# Patient Record
Sex: Female | Born: 1935 | Hispanic: No | Marital: Married | Smoking: Never smoker
Health system: Southern US, Community
[De-identification: ages and names within clinical notes are randomized; demographics above are authoritative.]

## PROBLEM LIST (undated history)

## (undated) DIAGNOSIS — R209 Unspecified disturbances of skin sensation: Secondary | ICD-10-CM

## (undated) DIAGNOSIS — E119 Type 2 diabetes mellitus without complications: Secondary | ICD-10-CM

## (undated) DIAGNOSIS — K219 Gastro-esophageal reflux disease without esophagitis: Secondary | ICD-10-CM

## (undated) DIAGNOSIS — I2510 Atherosclerotic heart disease of native coronary artery without angina pectoris: Secondary | ICD-10-CM

## (undated) DIAGNOSIS — E785 Hyperlipidemia, unspecified: Secondary | ICD-10-CM

## (undated) DIAGNOSIS — E7800 Pure hypercholesterolemia, unspecified: Secondary | ICD-10-CM

## (undated) DIAGNOSIS — I059 Rheumatic mitral valve disease, unspecified: Secondary | ICD-10-CM

## (undated) DIAGNOSIS — J40 Bronchitis, not specified as acute or chronic: Secondary | ICD-10-CM

## (undated) DIAGNOSIS — I10 Essential (primary) hypertension: Secondary | ICD-10-CM

## (undated) DIAGNOSIS — R918 Other nonspecific abnormal finding of lung field: Secondary | ICD-10-CM

## (undated) DIAGNOSIS — D3A00 Benign carcinoid tumor of unspecified site: Secondary | ICD-10-CM

## (undated) DIAGNOSIS — I7121 Aneurysm of the ascending aorta, without rupture: Secondary | ICD-10-CM

## (undated) DIAGNOSIS — G250 Essential tremor: Principal | ICD-10-CM

## (undated) DIAGNOSIS — R262 Difficulty in walking, not elsewhere classified: Secondary | ICD-10-CM

## (undated) DIAGNOSIS — M25569 Pain in unspecified knee: Secondary | ICD-10-CM

## (undated) DIAGNOSIS — I351 Nonrheumatic aortic (valve) insufficiency: Secondary | ICD-10-CM

## (undated) DIAGNOSIS — I712 Thoracic aortic aneurysm, without rupture: Secondary | ICD-10-CM

## (undated) DIAGNOSIS — R609 Edema, unspecified: Secondary | ICD-10-CM

## (undated) DIAGNOSIS — J449 Chronic obstructive pulmonary disease, unspecified: Secondary | ICD-10-CM

## (undated) DIAGNOSIS — M25549 Pain in joints of unspecified hand: Secondary | ICD-10-CM

## (undated) DIAGNOSIS — M25539 Pain in unspecified wrist: Secondary | ICD-10-CM

## (undated) HISTORY — DX: Gastro-esophageal reflux disease without esophagitis: K21.9

## (undated) HISTORY — DX: Other nonspecific abnormal finding of lung field: R91.8

## (undated) HISTORY — DX: Rheumatic mitral valve disease, unspecified: I05.9

## (undated) HISTORY — DX: Nonrheumatic aortic (valve) insufficiency: I35.1

## (undated) HISTORY — DX: Essential (primary) hypertension: I10

## (undated) HISTORY — DX: Chronic obstructive pulmonary disease, unspecified: J44.9

## (undated) HISTORY — DX: Benign carcinoid tumor of unspecified site: D3A.00

## (undated) HISTORY — DX: Thoracic aortic aneurysm, without rupture: I71.2

## (undated) HISTORY — PX: PARS PLANA VITRECTOMY: SHX2166

## (undated) HISTORY — DX: Aneurysm of the ascending aorta, without rupture: I71.21

## (undated) HISTORY — PX: PARS PLANA VITRECTOMY W/ ENDOPHOTOCOAGULATION: SHX2168

## (undated) HISTORY — DX: Hyperlipidemia, unspecified: E78.5

## (undated) HISTORY — DX: Essential tremor: G25.0

## (undated) HISTORY — DX: Pure hypercholesterolemia, unspecified: E78.00

## (undated) HISTORY — DX: Unspecified disturbances of skin sensation: R20.9

## (undated) HISTORY — DX: Type 2 diabetes mellitus without complications: E11.9

## (undated) HISTORY — DX: Pain in unspecified knee: M25.569

## (undated) HISTORY — DX: Atherosclerotic heart disease of native coronary artery without angina pectoris: I25.10

## (undated) HISTORY — DX: Edema, unspecified: R60.9

## (undated) HISTORY — DX: Difficulty in walking, not elsewhere classified: R26.2

## (undated) HISTORY — DX: Pain in joints of unspecified hand: M25.549

## (undated) HISTORY — PX: OTHER SURGICAL HISTORY: SHX169

## (undated) HISTORY — DX: Pain in unspecified wrist: M25.539

## (undated) HISTORY — DX: Bronchitis, not specified as acute or chronic: J40

---

## 2004-02-04 ENCOUNTER — Ambulatory Visit (HOSPITAL_COMMUNITY): Admission: RE | Admit: 2004-02-04 | Discharge: 2004-02-05 | Payer: Self-pay | Admitting: Internal Medicine

## 2004-10-25 DIAGNOSIS — C7A090 Malignant carcinoid tumor of the bronchus and lung: Secondary | ICD-10-CM | POA: Insufficient documentation

## 2004-11-09 ENCOUNTER — Ambulatory Visit: Payer: Self-pay | Admitting: Internal Medicine

## 2005-01-18 ENCOUNTER — Ambulatory Visit: Payer: Self-pay | Admitting: Cardiovascular Disease

## 2005-01-26 ENCOUNTER — Ambulatory Visit (HOSPITAL_COMMUNITY): Admission: RE | Admit: 2005-01-26 | Discharge: 2005-01-26 | Payer: Self-pay | Admitting: Obstetrics & Gynecology

## 2005-02-23 ENCOUNTER — Ambulatory Visit: Payer: Self-pay | Admitting: Internal Medicine

## 2005-03-12 ENCOUNTER — Ambulatory Visit: Payer: Self-pay | Admitting: Gastroenterology

## 2005-03-24 ENCOUNTER — Encounter (INDEPENDENT_AMBULATORY_CARE_PROVIDER_SITE_OTHER): Payer: Self-pay | Admitting: Specialist

## 2005-03-24 ENCOUNTER — Ambulatory Visit: Payer: Self-pay | Admitting: Gastroenterology

## 2005-05-11 ENCOUNTER — Ambulatory Visit: Payer: Self-pay | Admitting: Internal Medicine

## 2005-05-17 ENCOUNTER — Ambulatory Visit: Payer: Self-pay | Admitting: Cardiology

## 2005-05-31 ENCOUNTER — Ambulatory Visit: Payer: Self-pay | Admitting: Internal Medicine

## 2005-06-03 ENCOUNTER — Ambulatory Visit: Payer: Self-pay | Admitting: Critical Care Medicine

## 2005-06-09 ENCOUNTER — Ambulatory Visit (HOSPITAL_COMMUNITY): Admission: RE | Admit: 2005-06-09 | Discharge: 2005-06-09 | Payer: Self-pay | Admitting: Critical Care Medicine

## 2005-06-16 ENCOUNTER — Ambulatory Visit: Payer: Self-pay | Admitting: Critical Care Medicine

## 2005-06-17 ENCOUNTER — Ambulatory Visit (HOSPITAL_COMMUNITY): Admission: RE | Admit: 2005-06-17 | Discharge: 2005-06-17 | Payer: Self-pay | Admitting: Critical Care Medicine

## 2005-07-09 ENCOUNTER — Ambulatory Visit: Payer: Self-pay | Admitting: Cardiovascular Disease

## 2005-07-15 ENCOUNTER — Ambulatory Visit: Payer: Self-pay | Admitting: Internal Medicine

## 2005-07-15 ENCOUNTER — Encounter (INDEPENDENT_AMBULATORY_CARE_PROVIDER_SITE_OTHER): Payer: Self-pay | Admitting: *Deleted

## 2005-07-15 ENCOUNTER — Inpatient Hospital Stay (HOSPITAL_COMMUNITY): Admission: RE | Admit: 2005-07-15 | Discharge: 2005-07-20 | Payer: Self-pay | Admitting: Thoracic Surgery

## 2005-07-15 ENCOUNTER — Ambulatory Visit: Payer: Self-pay | Admitting: Critical Care Medicine

## 2005-07-20 ENCOUNTER — Ambulatory Visit: Payer: Self-pay | Admitting: Internal Medicine

## 2005-07-28 ENCOUNTER — Encounter: Admission: RE | Admit: 2005-07-28 | Discharge: 2005-07-28 | Payer: Self-pay | Admitting: Thoracic Surgery

## 2005-08-17 ENCOUNTER — Encounter: Admission: RE | Admit: 2005-08-17 | Discharge: 2005-08-17 | Payer: Self-pay | Admitting: Thoracic Surgery

## 2005-10-19 ENCOUNTER — Encounter: Admission: RE | Admit: 2005-10-19 | Discharge: 2005-10-19 | Payer: Self-pay | Admitting: Thoracic Surgery

## 2005-10-29 ENCOUNTER — Ambulatory Visit: Payer: Self-pay | Admitting: Critical Care Medicine

## 2005-11-16 ENCOUNTER — Ambulatory Visit: Payer: Self-pay | Admitting: Internal Medicine

## 2005-11-18 ENCOUNTER — Ambulatory Visit (HOSPITAL_COMMUNITY): Admission: RE | Admit: 2005-11-18 | Discharge: 2005-11-18 | Payer: Self-pay | Admitting: Internal Medicine

## 2006-01-14 ENCOUNTER — Ambulatory Visit: Payer: Self-pay | Admitting: Cardiovascular Disease

## 2006-01-19 ENCOUNTER — Encounter: Admission: RE | Admit: 2006-01-19 | Discharge: 2006-01-19 | Payer: Self-pay | Admitting: Thoracic Surgery

## 2006-04-20 ENCOUNTER — Encounter: Admission: RE | Admit: 2006-04-20 | Discharge: 2006-04-20 | Payer: Self-pay | Admitting: Thoracic Surgery

## 2006-05-03 ENCOUNTER — Ambulatory Visit (HOSPITAL_COMMUNITY): Admission: RE | Admit: 2006-05-03 | Discharge: 2006-05-03 | Payer: Self-pay | Admitting: Obstetrics & Gynecology

## 2006-05-12 ENCOUNTER — Ambulatory Visit: Payer: Self-pay | Admitting: Internal Medicine

## 2006-05-17 LAB — CBC WITH DIFFERENTIAL/PLATELET
BASO%: 0.3 % (ref 0.0–2.0)
Basophils Absolute: 0 10*3/uL (ref 0.0–0.1)
EOS%: 1.6 % (ref 0.0–7.0)
HCT: 35.7 % (ref 34.8–46.6)
HGB: 12 g/dL (ref 11.6–15.9)
MCHC: 33.7 g/dL (ref 32.0–36.0)
MONO#: 0.6 10*3/uL (ref 0.1–0.9)
NEUT%: 66.4 % (ref 39.6–76.8)
RDW: 14.1 % (ref 11.3–14.5)
WBC: 8.1 10*3/uL (ref 3.9–10.0)
lymph#: 2 10*3/uL (ref 0.9–3.3)

## 2006-05-17 LAB — COMPREHENSIVE METABOLIC PANEL
ALT: 16 U/L (ref 0–40)
AST: 19 U/L (ref 0–37)
Albumin: 4.3 g/dL (ref 3.5–5.2)
CO2: 28 mEq/L (ref 19–32)
Calcium: 9.1 mg/dL (ref 8.4–10.5)
Chloride: 101 mEq/L (ref 96–112)
Creatinine, Ser: 0.85 mg/dL (ref 0.40–1.20)
Potassium: 3.6 mEq/L (ref 3.5–5.3)

## 2006-05-19 ENCOUNTER — Ambulatory Visit (HOSPITAL_COMMUNITY): Admission: RE | Admit: 2006-05-19 | Discharge: 2006-05-19 | Payer: Self-pay | Admitting: Internal Medicine

## 2006-10-27 ENCOUNTER — Encounter: Admission: RE | Admit: 2006-10-27 | Discharge: 2006-10-27 | Payer: Self-pay | Admitting: Thoracic Surgery

## 2006-11-10 ENCOUNTER — Ambulatory Visit: Payer: Self-pay | Admitting: Internal Medicine

## 2006-11-15 LAB — COMPREHENSIVE METABOLIC PANEL
ALT: 13 U/L (ref 0–35)
AST: 16 U/L (ref 0–37)
CO2: 29 mEq/L (ref 19–32)
Calcium: 9.1 mg/dL (ref 8.4–10.5)
Chloride: 101 mEq/L (ref 96–112)
Creatinine, Ser: 0.78 mg/dL (ref 0.40–1.20)
Potassium: 4 mEq/L (ref 3.5–5.3)
Sodium: 139 mEq/L (ref 135–145)
Total Protein: 7.4 g/dL (ref 6.0–8.3)

## 2006-11-15 LAB — CBC WITH DIFFERENTIAL/PLATELET
BASO%: 0.3 % (ref 0.0–2.0)
EOS%: 1 % (ref 0.0–7.0)
HCT: 34.8 % (ref 34.8–46.6)
MCH: 28.5 pg (ref 26.0–34.0)
MCHC: 33.4 g/dL (ref 32.0–36.0)
MONO#: 0.5 10*3/uL (ref 0.1–0.9)
RBC: 4.07 10*6/uL (ref 3.70–5.32)
RDW: 13.6 % (ref 11.3–14.5)
WBC: 7.7 10*3/uL (ref 3.9–10.0)
lymph#: 1.7 10*3/uL (ref 0.9–3.3)

## 2006-11-17 ENCOUNTER — Ambulatory Visit (HOSPITAL_COMMUNITY): Admission: RE | Admit: 2006-11-17 | Discharge: 2006-11-17 | Payer: Self-pay | Admitting: Internal Medicine

## 2007-01-06 ENCOUNTER — Ambulatory Visit: Payer: Self-pay | Admitting: Cardiovascular Disease

## 2007-01-17 ENCOUNTER — Ambulatory Visit: Payer: Self-pay

## 2007-01-17 ENCOUNTER — Encounter: Payer: Self-pay | Admitting: Cardiology

## 2007-01-29 ENCOUNTER — Emergency Department (HOSPITAL_COMMUNITY): Admission: EM | Admit: 2007-01-29 | Discharge: 2007-01-29 | Payer: Self-pay | Admitting: Emergency Medicine

## 2007-05-03 ENCOUNTER — Ambulatory Visit: Payer: Self-pay | Admitting: Thoracic Surgery

## 2007-05-03 ENCOUNTER — Encounter: Admission: RE | Admit: 2007-05-03 | Discharge: 2007-05-03 | Payer: Self-pay | Admitting: Thoracic Surgery

## 2007-05-05 ENCOUNTER — Ambulatory Visit (HOSPITAL_COMMUNITY): Admission: RE | Admit: 2007-05-05 | Discharge: 2007-05-05 | Payer: Self-pay | Admitting: Obstetrics & Gynecology

## 2007-05-12 ENCOUNTER — Ambulatory Visit: Payer: Self-pay | Admitting: Internal Medicine

## 2007-05-12 ENCOUNTER — Encounter: Admission: RE | Admit: 2007-05-12 | Discharge: 2007-05-12 | Payer: Self-pay | Admitting: Obstetrics & Gynecology

## 2007-05-16 LAB — COMPREHENSIVE METABOLIC PANEL
ALT: 18 U/L (ref 0–35)
AST: 22 U/L (ref 0–37)
Chloride: 106 mEq/L (ref 96–112)
Creatinine, Ser: 0.86 mg/dL (ref 0.40–1.20)
Sodium: 142 mEq/L (ref 135–145)
Total Bilirubin: 0.5 mg/dL (ref 0.3–1.2)
Total Protein: 6.9 g/dL (ref 6.0–8.3)

## 2007-05-16 LAB — CBC WITH DIFFERENTIAL/PLATELET
BASO%: 0.4 % (ref 0.0–2.0)
EOS%: 1.7 % (ref 0.0–7.0)
HCT: 31.6 % — ABNORMAL LOW (ref 34.8–46.6)
LYMPH%: 24.5 % (ref 14.0–48.0)
MCH: 29.4 pg (ref 26.0–34.0)
MCHC: 34.4 g/dL (ref 32.0–36.0)
MONO#: 0.5 10*3/uL (ref 0.1–0.9)
NEUT%: 65.3 % (ref 39.6–76.8)
RBC: 3.7 10*6/uL (ref 3.70–5.32)
WBC: 6.7 10*3/uL (ref 3.9–10.0)
lymph#: 1.7 10*3/uL (ref 0.9–3.3)

## 2007-05-18 ENCOUNTER — Ambulatory Visit (HOSPITAL_COMMUNITY): Admission: RE | Admit: 2007-05-18 | Discharge: 2007-05-18 | Payer: Self-pay | Admitting: Internal Medicine

## 2007-08-16 ENCOUNTER — Ambulatory Visit: Payer: Self-pay | Admitting: Cardiovascular Disease

## 2007-11-10 ENCOUNTER — Ambulatory Visit: Payer: Self-pay | Admitting: Internal Medicine

## 2007-11-15 LAB — CBC WITH DIFFERENTIAL/PLATELET
BASO%: 0.4 % (ref 0.0–2.0)
Eosinophils Absolute: 0.1 10*3/uL (ref 0.0–0.5)
HCT: 34.2 % — ABNORMAL LOW (ref 34.8–46.6)
LYMPH%: 16.9 % (ref 14.0–48.0)
MONO#: 0.5 10*3/uL (ref 0.1–0.9)
NEUT#: 5.2 10*3/uL (ref 1.5–6.5)
NEUT%: 74.6 % (ref 39.6–76.8)
Platelets: 293 10*3/uL (ref 145–400)
RBC: 4.05 10*6/uL (ref 3.70–5.32)
WBC: 7 10*3/uL (ref 3.9–10.0)
lymph#: 1.2 10*3/uL (ref 0.9–3.3)

## 2007-11-15 LAB — COMPREHENSIVE METABOLIC PANEL
ALT: 16 U/L (ref 0–35)
CO2: 27 mEq/L (ref 19–32)
Calcium: 9 mg/dL (ref 8.4–10.5)
Chloride: 100 mEq/L (ref 96–112)
Glucose, Bld: 135 mg/dL — ABNORMAL HIGH (ref 70–99)
Sodium: 139 mEq/L (ref 135–145)
Total Bilirubin: 0.4 mg/dL (ref 0.3–1.2)
Total Protein: 7.2 g/dL (ref 6.0–8.3)

## 2007-11-17 ENCOUNTER — Ambulatory Visit (HOSPITAL_COMMUNITY): Admission: RE | Admit: 2007-11-17 | Discharge: 2007-11-17 | Payer: Self-pay | Admitting: Internal Medicine

## 2007-11-21 ENCOUNTER — Ambulatory Visit: Payer: Self-pay | Admitting: Thoracic Surgery

## 2007-11-23 ENCOUNTER — Encounter: Payer: Self-pay | Admitting: Critical Care Medicine

## 2007-12-07 ENCOUNTER — Encounter: Admission: RE | Admit: 2007-12-07 | Discharge: 2007-12-07 | Payer: Self-pay | Admitting: Obstetrics & Gynecology

## 2008-01-31 ENCOUNTER — Ambulatory Visit: Payer: Self-pay | Admitting: Cardiovascular Disease

## 2008-02-09 ENCOUNTER — Ambulatory Visit: Payer: Self-pay

## 2008-02-09 ENCOUNTER — Encounter: Payer: Self-pay | Admitting: Cardiovascular Disease

## 2008-05-13 ENCOUNTER — Ambulatory Visit: Payer: Self-pay | Admitting: Internal Medicine

## 2008-05-16 LAB — CBC WITH DIFFERENTIAL/PLATELET
BASO%: 0.3 % (ref 0.0–2.0)
EOS%: 1.3 % (ref 0.0–7.0)
Eosinophils Absolute: 0.1 10*3/uL (ref 0.0–0.5)
LYMPH%: 17.7 % (ref 14.0–48.0)
MCHC: 33.4 g/dL (ref 32.0–36.0)
MCV: 86.2 fL (ref 81.0–101.0)
MONO%: 6.6 % (ref 0.0–13.0)
NEUT#: 4.9 10*3/uL (ref 1.5–6.5)
RBC: 3.84 10*6/uL (ref 3.70–5.32)
RDW: 15 % — ABNORMAL HIGH (ref 11.3–14.5)

## 2008-05-16 LAB — COMPREHENSIVE METABOLIC PANEL
ALT: 17 U/L (ref 0–35)
AST: 20 U/L (ref 0–37)
Albumin: 4.4 g/dL (ref 3.5–5.2)
Alkaline Phosphatase: 79 U/L (ref 39–117)
Potassium: 3.7 mEq/L (ref 3.5–5.3)
Sodium: 139 mEq/L (ref 135–145)
Total Bilirubin: 0.4 mg/dL (ref 0.3–1.2)
Total Protein: 7 g/dL (ref 6.0–8.3)

## 2008-05-20 ENCOUNTER — Ambulatory Visit (HOSPITAL_COMMUNITY): Admission: RE | Admit: 2008-05-20 | Discharge: 2008-05-20 | Payer: Self-pay | Admitting: Internal Medicine

## 2008-05-23 ENCOUNTER — Encounter: Payer: Self-pay | Admitting: Critical Care Medicine

## 2008-06-19 ENCOUNTER — Encounter: Admission: RE | Admit: 2008-06-19 | Discharge: 2008-06-19 | Payer: Self-pay | Admitting: Obstetrics & Gynecology

## 2008-06-20 ENCOUNTER — Emergency Department (HOSPITAL_COMMUNITY): Admission: EM | Admit: 2008-06-20 | Discharge: 2008-06-20 | Payer: Self-pay | Admitting: Family Medicine

## 2008-06-24 ENCOUNTER — Emergency Department (HOSPITAL_COMMUNITY): Admission: EM | Admit: 2008-06-24 | Discharge: 2008-06-24 | Payer: Self-pay | Admitting: Family Medicine

## 2008-06-26 ENCOUNTER — Ambulatory Visit: Payer: Self-pay | Admitting: Thoracic Surgery

## 2008-07-10 ENCOUNTER — Ambulatory Visit: Payer: Self-pay | Admitting: Cardiovascular Disease

## 2008-11-11 ENCOUNTER — Ambulatory Visit: Payer: Self-pay | Admitting: Internal Medicine

## 2008-11-18 ENCOUNTER — Ambulatory Visit (HOSPITAL_COMMUNITY): Admission: RE | Admit: 2008-11-18 | Discharge: 2008-11-18 | Payer: Self-pay | Admitting: Internal Medicine

## 2008-11-20 ENCOUNTER — Encounter: Payer: Self-pay | Admitting: Critical Care Medicine

## 2008-12-25 ENCOUNTER — Encounter: Admission: RE | Admit: 2008-12-25 | Discharge: 2008-12-25 | Payer: Self-pay | Admitting: Thoracic Surgery

## 2008-12-25 ENCOUNTER — Ambulatory Visit: Payer: Self-pay | Admitting: Thoracic Surgery

## 2009-01-07 ENCOUNTER — Ambulatory Visit: Payer: Self-pay | Admitting: Cardiovascular Disease

## 2009-04-10 ENCOUNTER — Encounter: Payer: Self-pay | Admitting: Cardiovascular Disease

## 2009-04-15 ENCOUNTER — Emergency Department (HOSPITAL_COMMUNITY): Admission: EM | Admit: 2009-04-15 | Discharge: 2009-04-15 | Payer: Self-pay | Admitting: Family Medicine

## 2009-05-15 ENCOUNTER — Ambulatory Visit: Payer: Self-pay | Admitting: Internal Medicine

## 2009-05-20 LAB — CBC WITH DIFFERENTIAL/PLATELET
BASO%: 0.3 % (ref 0.0–2.0)
Eosinophils Absolute: 0.1 10*3/uL (ref 0.0–0.5)
MONO#: 0.3 10*3/uL (ref 0.1–0.9)
MONO%: 6.1 % (ref 0.0–14.0)
NEUT#: 3.6 10*3/uL (ref 1.5–6.5)
RBC: 3.8 10*6/uL (ref 3.70–5.45)
RDW: 13.6 % (ref 11.2–14.5)
WBC: 5 10*3/uL (ref 3.9–10.3)

## 2009-05-20 LAB — COMPREHENSIVE METABOLIC PANEL
ALT: 20 U/L (ref 0–35)
Albumin: 3.8 g/dL (ref 3.5–5.2)
Alkaline Phosphatase: 66 U/L (ref 39–117)
CO2: 29 mEq/L (ref 19–32)
Glucose, Bld: 172 mg/dL — ABNORMAL HIGH (ref 70–99)
Potassium: 3.5 mEq/L (ref 3.5–5.3)
Sodium: 136 mEq/L (ref 135–145)
Total Protein: 7 g/dL (ref 6.0–8.3)

## 2009-05-26 ENCOUNTER — Encounter: Payer: Self-pay | Admitting: Critical Care Medicine

## 2009-06-17 ENCOUNTER — Ambulatory Visit: Payer: Self-pay | Admitting: Thoracic Surgery

## 2009-06-17 ENCOUNTER — Encounter: Admission: RE | Admit: 2009-06-17 | Discharge: 2009-06-17 | Payer: Self-pay | Admitting: Thoracic Surgery

## 2009-06-19 ENCOUNTER — Ambulatory Visit: Payer: Self-pay | Admitting: Cardiovascular Disease

## 2009-06-19 DIAGNOSIS — K219 Gastro-esophageal reflux disease without esophagitis: Secondary | ICD-10-CM | POA: Insufficient documentation

## 2009-06-19 DIAGNOSIS — E1151 Type 2 diabetes mellitus with diabetic peripheral angiopathy without gangrene: Secondary | ICD-10-CM

## 2009-06-19 DIAGNOSIS — E785 Hyperlipidemia, unspecified: Secondary | ICD-10-CM | POA: Insufficient documentation

## 2009-06-19 DIAGNOSIS — E7800 Pure hypercholesterolemia, unspecified: Secondary | ICD-10-CM

## 2009-06-19 DIAGNOSIS — I351 Nonrheumatic aortic (valve) insufficiency: Secondary | ICD-10-CM

## 2009-06-19 DIAGNOSIS — J40 Bronchitis, not specified as acute or chronic: Secondary | ICD-10-CM | POA: Insufficient documentation

## 2009-06-19 DIAGNOSIS — I2510 Atherosclerotic heart disease of native coronary artery without angina pectoris: Secondary | ICD-10-CM

## 2009-06-19 DIAGNOSIS — I10 Essential (primary) hypertension: Secondary | ICD-10-CM | POA: Insufficient documentation

## 2009-06-19 DIAGNOSIS — J449 Chronic obstructive pulmonary disease, unspecified: Secondary | ICD-10-CM

## 2009-06-19 DIAGNOSIS — I719 Aortic aneurysm of unspecified site, without rupture: Secondary | ICD-10-CM | POA: Insufficient documentation

## 2009-06-19 DIAGNOSIS — R609 Edema, unspecified: Secondary | ICD-10-CM

## 2009-07-16 ENCOUNTER — Encounter: Admission: RE | Admit: 2009-07-16 | Discharge: 2009-07-16 | Payer: Self-pay | Admitting: Unknown Physician Specialty

## 2009-08-29 ENCOUNTER — Emergency Department (HOSPITAL_COMMUNITY): Admission: EM | Admit: 2009-08-29 | Discharge: 2009-08-29 | Payer: Self-pay | Admitting: Emergency Medicine

## 2009-09-02 ENCOUNTER — Encounter: Admission: RE | Admit: 2009-09-02 | Discharge: 2009-09-02 | Payer: Self-pay | Admitting: Thoracic Surgery

## 2009-09-02 ENCOUNTER — Ambulatory Visit: Payer: Self-pay | Admitting: Thoracic Surgery

## 2009-09-08 ENCOUNTER — Encounter
Admission: RE | Admit: 2009-09-08 | Discharge: 2009-10-21 | Payer: Self-pay | Admitting: Physical Medicine & Rehabilitation

## 2009-09-09 ENCOUNTER — Ambulatory Visit: Payer: Self-pay | Admitting: Physical Medicine & Rehabilitation

## 2009-09-15 ENCOUNTER — Ambulatory Visit (HOSPITAL_COMMUNITY): Admission: RE | Admit: 2009-09-15 | Discharge: 2009-09-15 | Payer: Self-pay | Admitting: Thoracic Surgery

## 2009-09-15 ENCOUNTER — Ambulatory Visit: Payer: Self-pay | Admitting: Thoracic Surgery

## 2009-09-15 HISTORY — PX: OTHER SURGICAL HISTORY: SHX169

## 2009-09-26 ENCOUNTER — Ambulatory Visit (HOSPITAL_COMMUNITY)
Admission: RE | Admit: 2009-09-26 | Discharge: 2009-09-26 | Payer: Self-pay | Admitting: Physical Medicine & Rehabilitation

## 2009-10-02 ENCOUNTER — Ambulatory Visit (HOSPITAL_COMMUNITY): Admission: RE | Admit: 2009-10-02 | Discharge: 2009-10-02 | Payer: Self-pay | Admitting: Thoracic Surgery

## 2009-10-06 ENCOUNTER — Encounter: Payer: Self-pay | Admitting: Thoracic Surgery

## 2009-10-06 ENCOUNTER — Observation Stay (HOSPITAL_COMMUNITY): Admission: RE | Admit: 2009-10-06 | Discharge: 2009-10-07 | Payer: Self-pay | Admitting: Thoracic Surgery

## 2009-10-06 HISTORY — PX: OTHER SURGICAL HISTORY: SHX169

## 2009-10-14 ENCOUNTER — Ambulatory Visit: Payer: Self-pay | Admitting: Physical Medicine & Rehabilitation

## 2009-10-15 ENCOUNTER — Encounter: Admission: RE | Admit: 2009-10-15 | Discharge: 2009-10-15 | Payer: Self-pay | Admitting: Thoracic Surgery

## 2009-10-15 ENCOUNTER — Ambulatory Visit: Payer: Self-pay | Admitting: Thoracic Surgery

## 2009-11-06 ENCOUNTER — Encounter
Admission: RE | Admit: 2009-11-06 | Discharge: 2010-02-04 | Payer: Self-pay | Admitting: Physical Medicine & Rehabilitation

## 2009-11-07 ENCOUNTER — Ambulatory Visit: Payer: Self-pay | Admitting: Physical Medicine & Rehabilitation

## 2009-11-12 ENCOUNTER — Telehealth: Payer: Self-pay | Admitting: Cardiovascular Disease

## 2009-11-13 ENCOUNTER — Encounter: Payer: Self-pay | Admitting: Cardiovascular Disease

## 2009-11-19 ENCOUNTER — Ambulatory Visit: Payer: Self-pay | Admitting: Internal Medicine

## 2009-11-21 ENCOUNTER — Ambulatory Visit (HOSPITAL_COMMUNITY): Admission: RE | Admit: 2009-11-21 | Discharge: 2009-11-21 | Payer: Self-pay | Admitting: Internal Medicine

## 2009-11-21 LAB — CBC WITH DIFFERENTIAL/PLATELET
Basophils Absolute: 0 10*3/uL (ref 0.0–0.1)
Eosinophils Absolute: 0.1 10*3/uL (ref 0.0–0.5)
HCT: 36.7 % (ref 34.8–46.6)
HGB: 12.3 g/dL (ref 11.6–15.9)
MCV: 92.3 fL (ref 79.5–101.0)
MONO%: 8 % (ref 0.0–14.0)
NEUT#: 4.9 10*3/uL (ref 1.5–6.5)
RDW: 14.1 % (ref 11.2–14.5)

## 2009-11-21 LAB — COMPREHENSIVE METABOLIC PANEL
Albumin: 3.7 g/dL (ref 3.5–5.2)
BUN: 23 mg/dL (ref 6–23)
Calcium: 8.8 mg/dL (ref 8.4–10.5)
Chloride: 103 mEq/L (ref 96–112)
Glucose, Bld: 114 mg/dL — ABNORMAL HIGH (ref 70–99)
Potassium: 3.7 mEq/L (ref 3.5–5.3)

## 2009-11-26 ENCOUNTER — Encounter: Admission: RE | Admit: 2009-11-26 | Discharge: 2009-11-26 | Payer: Self-pay | Admitting: Thoracic Surgery

## 2009-11-26 ENCOUNTER — Ambulatory Visit: Payer: Self-pay | Admitting: Thoracic Surgery

## 2009-12-05 ENCOUNTER — Telehealth: Payer: Self-pay | Admitting: Cardiovascular Disease

## 2009-12-09 ENCOUNTER — Ambulatory Visit (HOSPITAL_COMMUNITY): Admission: RE | Admit: 2009-12-09 | Discharge: 2009-12-09 | Payer: Self-pay | Admitting: Thoracic Surgery

## 2009-12-09 ENCOUNTER — Ambulatory Visit: Payer: Self-pay | Admitting: Thoracic Surgery

## 2009-12-09 HISTORY — PX: VIDEO BRONCHOSCOPY: SHX5072

## 2009-12-12 ENCOUNTER — Ambulatory Visit: Payer: Self-pay | Admitting: Thoracic Surgery

## 2009-12-23 ENCOUNTER — Telehealth: Payer: Self-pay | Admitting: Cardiovascular Disease

## 2010-01-01 ENCOUNTER — Telehealth: Payer: Self-pay | Admitting: Cardiovascular Disease

## 2010-01-05 ENCOUNTER — Encounter: Payer: Self-pay | Admitting: Cardiovascular Disease

## 2010-03-10 ENCOUNTER — Ambulatory Visit: Payer: Self-pay | Admitting: Thoracic Surgery

## 2010-04-01 ENCOUNTER — Encounter: Payer: Self-pay | Admitting: Internal Medicine

## 2010-04-06 ENCOUNTER — Encounter (INDEPENDENT_AMBULATORY_CARE_PROVIDER_SITE_OTHER): Payer: Self-pay | Admitting: *Deleted

## 2010-05-13 ENCOUNTER — Encounter: Payer: Self-pay | Admitting: Cardiovascular Disease

## 2010-05-22 ENCOUNTER — Ambulatory Visit (HOSPITAL_BASED_OUTPATIENT_CLINIC_OR_DEPARTMENT_OTHER): Payer: Medicare Other | Admitting: Internal Medicine

## 2010-05-26 ENCOUNTER — Ambulatory Visit (HOSPITAL_COMMUNITY): Admission: RE | Admit: 2010-05-26 | Discharge: 2010-05-26 | Payer: Self-pay | Admitting: Internal Medicine

## 2010-05-26 LAB — CBC WITH DIFFERENTIAL/PLATELET
Basophils Absolute: 0 10*3/uL (ref 0.0–0.1)
EOS%: 2.5 % (ref 0.0–7.0)
HGB: 11.5 g/dL — ABNORMAL LOW (ref 11.6–15.9)
MCH: 31.4 pg (ref 25.1–34.0)
NEUT#: 3.7 10*3/uL (ref 1.5–6.5)
RBC: 3.67 10*6/uL — ABNORMAL LOW (ref 3.70–5.45)
RDW: 13.9 % (ref 11.2–14.5)
lymph#: 1.4 10*3/uL (ref 0.9–3.3)

## 2010-05-26 LAB — COMPREHENSIVE METABOLIC PANEL
Alkaline Phosphatase: 72 U/L (ref 39–117)
Chloride: 101 mEq/L (ref 96–112)
Creatinine, Ser: 0.96 mg/dL (ref 0.40–1.20)
Glucose, Bld: 75 mg/dL (ref 70–99)
Potassium: 3.7 mEq/L (ref 3.5–5.3)
Sodium: 138 mEq/L (ref 135–145)
Total Bilirubin: 1 mg/dL (ref 0.3–1.2)

## 2010-06-03 ENCOUNTER — Ambulatory Visit: Payer: Self-pay | Admitting: Thoracic Surgery

## 2010-06-05 ENCOUNTER — Ambulatory Visit: Payer: Self-pay | Admitting: Physical Medicine & Rehabilitation

## 2010-06-05 ENCOUNTER — Encounter
Admission: RE | Admit: 2010-06-05 | Discharge: 2010-09-03 | Payer: Self-pay | Admitting: Physical Medicine & Rehabilitation

## 2010-06-22 ENCOUNTER — Ambulatory Visit: Payer: Self-pay | Admitting: Cardiovascular Disease

## 2010-07-02 ENCOUNTER — Ambulatory Visit: Payer: Self-pay | Admitting: Thoracic Surgery

## 2010-07-02 ENCOUNTER — Ambulatory Visit (HOSPITAL_COMMUNITY): Admission: RE | Admit: 2010-07-02 | Discharge: 2010-07-02 | Payer: Self-pay | Admitting: Thoracic Surgery

## 2010-07-02 HISTORY — PX: OTHER SURGICAL HISTORY: SHX169

## 2010-07-08 ENCOUNTER — Ambulatory Visit: Payer: Self-pay | Admitting: Thoracic Surgery

## 2010-08-28 ENCOUNTER — Encounter: Admission: RE | Admit: 2010-08-28 | Discharge: 2010-08-28 | Payer: Self-pay | Admitting: Obstetrics & Gynecology

## 2010-09-30 ENCOUNTER — Ambulatory Visit: Payer: Self-pay | Admitting: Thoracic Surgery

## 2010-09-30 ENCOUNTER — Encounter
Admission: RE | Admit: 2010-09-30 | Discharge: 2010-09-30 | Payer: Self-pay | Source: Home / Self Care | Admitting: Thoracic Surgery

## 2010-11-03 ENCOUNTER — Telehealth: Payer: Self-pay | Admitting: Cardiovascular Disease

## 2010-11-10 ENCOUNTER — Encounter: Payer: Self-pay | Admitting: Cardiovascular Disease

## 2010-11-10 ENCOUNTER — Telehealth: Payer: Self-pay | Admitting: Cardiovascular Disease

## 2010-11-12 ENCOUNTER — Other Ambulatory Visit: Payer: Self-pay | Admitting: Internal Medicine

## 2010-11-12 ENCOUNTER — Telehealth: Payer: Self-pay | Admitting: Cardiovascular Disease

## 2010-11-12 DIAGNOSIS — D3A00 Benign carcinoid tumor of unspecified site: Secondary | ICD-10-CM

## 2010-11-13 ENCOUNTER — Encounter (INDEPENDENT_AMBULATORY_CARE_PROVIDER_SITE_OTHER): Payer: Self-pay | Admitting: *Deleted

## 2010-11-14 ENCOUNTER — Encounter: Payer: Self-pay | Admitting: Thoracic Surgery

## 2010-11-15 ENCOUNTER — Encounter: Payer: Self-pay | Admitting: Thoracic Surgery

## 2010-11-16 ENCOUNTER — Encounter: Payer: Self-pay | Admitting: Internal Medicine

## 2010-11-24 NOTE — Progress Notes (Signed)
        Additional Follow-up for Phone Call Additional follow up Details #2::    Pharmacy states they got the form but they need more information she states they sent another form but i never recined this form will; ask pt if i can call it into another pharmacy until they can get it through the mail. Called pt to let her no we have samples but they will not pick up the phone called twice will call again. I have enough samples until her medication comes in the mail but i only have 20mg  she will have to take 2 tabs. Follow-up by: Kem Parkinson,  December 05, 2009 8:39 AM  Additional Follow-up for Phone Call Additional follow up Details #3:: Details for Additional Follow-up Action Taken: Got in touch with pt i told her to come up to the office and pick up samples. [employer] people with the PA form and asked them to resend a copy of what we need tio fill out   [phone] insurane company number. Woud like to know the diagnoses. I asked them fri to send over PA form.  Called and talked to insurance they said they will be faxing over a form after they review her order

## 2010-11-24 NOTE — Consult Note (Signed)
Summary: [facility]  [facility]   Imported By: Marylou Mccoy 06/02/2010 14:44:39  _____________________________________________________________________  External Attachment:    Type:   Image     Comment:   External Document

## 2010-11-24 NOTE — Procedures (Signed)
Summary: Colonoscopy/[facility]  Colonoscopy/[facility]   Imported By: Sherian Rein 04/06/2010 12:06:24  _____________________________________________________________________  External Attachment:    Type:   Image     Comment:   External Document

## 2010-11-24 NOTE — Assessment & Plan Note (Signed)
Summary: f1y/dm  Medications Added FUROSEMIDE 20 MG TABS (FUROSEMIDE) 1 tablet daily as needed for swelling      Allergies Added: NKDA  CC:  check up.  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  I reviewed a letter from Dr. Edwyna Shell indicating that her ascending aorta had reached 5 cm.  She was referred to [facility] for further evaluation.  She does not strike me as an ideal operative candidate.  Her hypertension has been under good control.  I told her I wanted her on 2.5 mg of Indapamide.  I also told her that I would like her to talk to her primary care doctor about getting off Actos.  She has significant lower extremity edema from varicose veins and this is harder to treat while on this particular oral hypoglycemic.  Otherwise she's not had any significant chest pain.  She has mild chronic edema she has mild chronic exertional dyspnea.  I believe she sees Dr. Bernette Mayers for her carcinoid. I reviewed her CT from this month and the ascending Ao is stable at 5.0cm  with stable metastatic carcinoid burden  Current Problems (verified): 1)  Hypertension  (ICD-401.9) 2)  Cad  (ICD-414.00) 3)  Aortic Insufficiency  (ICD-424.1) 4)  Hyperlipidemia  (ICD-272.4) 5)  Aortic Aneurysm  (ICD-441.9) 6)  Hypercholesterolemia  (ICD-272.0) 7)  Dm  (ICD-250.00) 8)  COPD  (ICD-496) 9)  Bronchitis  (ICD-490) 10)  Edema  (ICD-782.3) 11)  Gerd  (ICD-530.81)  Current Medications (verified): 1)  Nitrostat 0.4 Mg Subl (Nitroglycerin) .... As Needed 2)  Omeprazole 40 Mg Cpdr (Omeprazole) .Marland Kitchen.. 1 Tab By Mouth Once Daily 3)  Indapamide 2.5 Mg Tabs (Indapamide) .... One Tab By Mouth Once Daily 4)  Aspirin 81 Mg  Tabs (Aspirin) .Marland Kitchen.. 1 Tab By Mouth Once Daily 5)  Ginkgo Biloba   Extr (Ginkgo Biloba) .Marland Kitchen.. 1 Tab By Mouth Once Daily 6)  Multivitamins   Tabs (Multiple Vitamin)  .Marland Kitchen.. 1 Tab By Mouth Once Daily 7)  Tylenol 325 Mg Tabs (Acetaminophen) .... As Needed 8)  Potassium Chloride Cr 10 Meq Cr-Caps (Potassium Chloride) .... 2 Tab By Mouth Once Daily 9)  Integra Plus  Caps (Fefum-Fepoly-Fa-B Cmp-C-Biot) .Marland Kitchen.. 1 Tab By Mouth Once Daily 10)  Glimepiride 1 Mg Tabs (Glimepiride) .Marland Kitchen.. 1 Tab By Mouth Once Daily 11)  Amlodipine Besylate 10 Mg Tabs (Amlodipine Besylate) .Marland Kitchen.. 1 Tab By Mouth Once Daily 12)  Tussionex Pennkinetic Er 8-10 Mg/88ml Lqcr (Chlorpheniramine-Hydrocodone) .... As Needed 13)  Spiriva Handihaler 18 Mcg Caps (Tiotropium Bromide Monohydrate) .... Once Daily 14)  Advair Diskus 100-50 Mcg/dose Misc (Fluticasone-Salmeterol) .... Once Daily 15)  Proventil Hfa 108 (90 Base) Mcg/act Aers (Albuterol Sulfate) .... Once Daily 16)  Flonase 50 Mcg/act Susp (Fluticasone Propionate) .... Once Daily 17)  Singulair 10 Mg Tabs (Montelukast Sodium) .Marland Kitchen.. 1 Once Daily 18)  Aristocort Cream .... As Needed 19)  Benicar 40 Mg Tabs (Olmesartan Medoxomil) .Marland Kitchen.. 1 Tab By Mouth Two Times A Day 20)  Crestor 5 Mg Tabs (Rosuvastatin Calcium) .... Take One Tablet By Mouth Daily. 21)  Actos 45 Mg Tabs (Pioglitazone Hcl) .Marland Kitchen.. 1 Tab By Mouth Once Daily 22)  Nifedipine 30 Mg Xr24h-Tab (Nifedipine) .Marland Kitchen.. 1 Tab By Mouth Once Daily  Allergies (verified): No Known Drug Allergies  Past History:  Past Medical History: Last updated: 06/19/2009 Current Problems:  HYPERTENSION (ICD-401.9) CAD (ICD-414.00) AORTIC INSUFFICIENCY (ICD-424.1) HYPERLIPIDEMIA (ICD-272.4)  AORTIC ANEURYSM (ICD-441.9) HYPERCHOLESTEROLEMIA (ICD-272.0) DM (ICD-250.00) COPD (ICD-496) BRONCHITIS (ICD-490) EDEMA (ICD-782.3) GERD (ICD-530.81) carcinoid tumor of lung   Stable aortic valve disease  Past Surgical History: Last updated: 06/19/2009 Pars plana vitrectomy, right eye; posterior capsulectomy, right eye; endophotocoagulation, right eye.  Family History: Last updated: 06/19/2009 non-contribry  Social  History: Last updated: 06/19/2009 Married From [place] Lives with daughter Non-smoker Non-drinker  Review of Systems       Denies fever, malais, weight loss, blurry vision, decreased visual acuity, cough, sputum, SOB, hemoptysis, pleuritic pain, palpitaitons, heartburn, abdominal pain, melena, lower extremity edema, claudication, or rash.   Vital Signs:  Patient profile:   77 year old female Weight:      185 pounds Pulse rate:   71 / minute Resp:     12 per minute BP sitting:   132 / 86  (left arm)  Vitals Entered By: Kem Parkinson (June 22, 2010 2:29 PM)  Physical Exam  General:  Affect appropriate Healthy:  appears stated age HEENT: normal Neck supple with no adenopathy JVP normal no bruits no thyromegaly Lungs clear with no wheezing and good diaphragmatic motion Heart:  S1/S2 AS/AR murmur,rub, gallop or click PMI normal Abdomen: benighn, BS positve, no tenderness, no AAA no bruit.  No HSM or HJR Distal pulses intact with no bruits Plus one bilateral  edema Neuro non-focal Skin warm and dry    Impression & Recommendations:  Problem # 1:  HYPERTENSION (ICD-401.9) Improved with indapamide.  Continue low sodium diet Her updated medication list for this problem includes:    Indapamide 2.5 Mg Tabs (Indapamide) ..... One tab by mouth once daily    Aspirin 81 Mg Tabs (Aspirin) .Marland Kitchen... 1 tab by mouth once daily    Amlodipine Besylate 10 Mg Tabs (Amlodipine besylate) .Marland Kitchen... 1 tab by mouth once daily    Benicar 40 Mg Tabs (Olmesartan medoxomil) .Marland Kitchen... 1 tab by mouth two times a day    Nifedipine 30 Mg Xr24h-tab (Nifedipine) .Marland Kitchen... 1 tab by mouth once daily    Furosemide 20 Mg Tabs (Furosemide) .Marland Kitchen... 1 tablet daily as needed for swelling  Problem # 2:  CAD (ICD-414.00)  Stable no angina Her updated medication list for this problem includes:    Nitrostat 0.4 Mg Subl (Nitroglycerin) .Marland Kitchen... As needed    Aspirin 81 Mg Tabs (Aspirin) .Marland Kitchen... 1 tab by mouth once daily     Amlodipine Besylate 10 Mg Tabs (Amlodipine besylate) .Marland Kitchen... 1 tab by mouth once daily    Nifedipine 30 Mg Xr24h-tab (Nifedipine) .Marland Kitchen... 1 tab by mouth once daily  Her updated medication list for this problem includes:    Nitrostat 0.4 Mg Subl (Nitroglycerin) .Marland Kitchen... As needed    Aspirin 81 Mg Tabs (Aspirin) .Marland Kitchen... 1 tab by mouth once daily    Amlodipine Besylate 10 Mg Tabs (Amlodipine besylate) .Marland Kitchen... 1 tab by mouth once daily    Nifedipine 30 Mg Xr24h-tab (Nifedipine) .Marland Kitchen... 1 tab by mouth once daily  Problem # 3:  AORTIC INSUFFICIENCY (ICD-424.1) F/U echo in 6 months.  Not an ideal candidate for Bental Her updated medication list for this problem includes:    Nitrostat 0.4 Mg Subl (Nitroglycerin) .Marland Kitchen... As needed    Indapamide 2.5 Mg Tabs (Indapamide) ..... One tab by mouth once daily    Benicar 40 Mg Tabs (Olmesartan medoxomil) .Marland Kitchen... 1 tab by mouth two times a day    Furosemide 20 Mg Tabs (Furosemide) .Marland Kitchen... 1 tablet daily as needed for swelling  Orders: EKG w/ Interpretation (  93000)  Problem # 4:  HYPERLIPIDEMIA (ICD-272.4) Continue statin.  Labs ok in 8/11 with Altheimer Her updated medication list for this problem includes:    Crestor 5 Mg Tabs (Rosuvastatin calcium) .Marland Kitchen... Take one tablet by mouth daily.  Problem # 5:  AORTIC ANEURYSM (ICD-441.9) Stable thoracic dilatation 5.0cm  F/U Babtist  Patient Instructions: 1)  Your physician recommends that you schedule a follow-up appointment in: 6 months 2)  Your physician has recommended you make the following change in your medication: LASIX 20 MG DAILY AS NEEDED FOR SWELLING. 3)  Your physician has requested that you have an echocardiogram.  Echocardiography is a painless test that uses sound waves to create images of your heart. It provides your doctor with information about the size and shape of your heart and how well your heart's chambers and valves are working.  This procedure takes approximately one hour. There are no restrictions for  this procedure. SCHEDULE IN 6 MONTHS Prescriptions: FUROSEMIDE 20 MG TABS (FUROSEMIDE) 1 tablet daily as needed for swelling  #30 x 12   Entered by:   Claris Gladden RN   Authorized by:   Colon Branch, MD, [facility]   Signed by:   Claris Gladden RN on 06/22/2010   Method used:   Electronically to        [facility] # [id number][facility] (retail)       [address] [place], [place]  [address]       Ph: [phone]       Fax: [phone]   RxID:   [id number]    EKG Report  Procedure date:  06/22/2010  Findings:      NSR 65 PR 280 LVH LAD

## 2010-11-24 NOTE — Letter (Signed)
Summary: Colonoscopy Letter  [facility]  [address] [place], [place] [address]   Phone: [phone]  Fax: [phone]      April 06, 2010 MRN: [id number]   [facility] [address] [place], [place]  [address]   Dear [name],   According to your medical record, it is time for you to schedule a Colonoscopy. The American Cancer Society recommends this procedure as a method to detect early colon cancer. Patients with a family history of colon cancer, or a personal history of colon polyps or inflammatory bowel disease are at increased risk.  This letter has beeen generated based on the recommendations made at the time of your procedure. If you feel that in your particular situation this may no longer apply, please contact our office.  Please call our office at [phone] to schedule this appointment or to update your records at your earliest convenience.  Thank you for cooperating with [place] to provide you with the very best care possible.   Sincerely,   Barbette Hair. Arlyce Dice, M.D.  [facility] Gastroenterology Division [phone]

## 2010-11-24 NOTE — Progress Notes (Signed)
Summary: refill meds / pls call pt    Phone Note Call from Patient Call back at Home Phone [phone] Message from:  Patient on December 23, 2009 1:48 PM  Refills Requested: Medication #1:  BENICAR 40 MG TABS 1 tab by mouth two times a day [facility]    Method Requested: Fax to Local Pharmacy Initial call taken by: Lorne Skeens,  December 23, 2009 1:49 PM Reason for Call: Talk to Nurse Details for Reason: pls call regarding her benicar. Initial call taken by: Lorne Skeens,  December 23, 2009 1:50 PM  Follow-up for Phone Call        called insurence company agian The repersentive named Maxine Glenn states we need to file an appeal because med was denied they only will cover it once a day. We need to send a letter to [phone] sayin the reason why we have them on two times a day. i asked the insurance company how was we suppsose to know it was denied Maxine Glenn the represntive states the comapny did not send out the appropiate form regarding that and she apologizes. D Mathis called Clyda Greener back but did not answer Follow-up by: Kem Parkinson,  December 23, 2009 2:43 PM  Additional Follow-up for Phone Call Additional follow up Details #1::        letter faxed to above number Deliah Goody, RN  December 29, 2009 12:11 PM

## 2010-11-24 NOTE — Progress Notes (Signed)
Summary: pls call daughter they having trouble with ins   Phone Note Refill Request Call back at Home Phone [phone] Message from:  Patient on Rinna daughter   Refills Requested: Medication #1:  BENICAR 40 MG TABS 1 tab by mouth two times a day [phone] they need more iformation fax# [phone]  Initial call taken by: Omer Jack,  November 12, 2009 4:07 PM Caller: Patient  Follow-up for Phone Call        called pharmacy wating on fax form of Prior auth Follow-up by: Kem Parkinson,  November 13, 2009 8:31 AM  Additional Follow-up for Phone Call Additional follow up Details #1::        got form will fax over Additional Follow-up by: Kem Parkinson,  November 14, 2009 8:33 AM

## 2010-11-24 NOTE — Letter (Signed)
Summary: Genice Rouge Dunn OD  Genice Rouge Dunn OD   Imported By: Sherian Rein 04/29/2010 15:21:11  _____________________________________________________________________  External Attachment:    Type:   Image     Comment:   External Document

## 2010-11-24 NOTE — Medication Information (Signed)
Summary: Prescription Authorization Request  Prescription Authorization Request   Imported By: Roderic Ovens 12/05/2009 15:57:01  _____________________________________________________________________  External Attachment:    Type:   Image     Comment:   External Document

## 2010-11-24 NOTE — Progress Notes (Signed)
Summary: Pt calling regarding Benicar/LMOM for C/B   Phone Note Call from Patient Call back at Home Phone [phone]   Caller: Patient Summary of Call: Pt calling regarding Benicar Initial call taken by: Judie Grieve,  January 01, 2010 3:02 PM  Follow-up for Phone Call        Line busy times 2.   LMOM 445 pm Follow-up by: Suzan Garibaldi RN  Additional Follow-up for Phone Call Additional follow up Details #1::        Called patient back and advised her that Deliah Goody faxed a form to her insurance company on 3/7 and we are waiting on an answer concerning her Benicar. Wil  call her back when we obtain feedback. Additional Follow-up by: Suzan Garibaldi RN    Additional Follow-up for Phone Call Additional follow up Details #2::    spoke with pt insurance, benicar was denied. dr Eden Emms will need to dictate a letter to appeal.Debra Betsey Holiday, RN  January 07, 2010 4:36 PM  pt aware of cont process Deliah Goody, RN  January 08, 2010 2:51 PM

## 2010-11-24 NOTE — Letter (Signed)
Summary: [employer] Authorization   Imported By: Roderic Ovens 02/02/2010 16:12:54  _____________________________________________________________________  External Attachment:    Type:   Image     Comment:   External Document

## 2010-11-25 ENCOUNTER — Ambulatory Visit (HOSPITAL_COMMUNITY)
Admission: RE | Admit: 2010-11-25 | Discharge: 2010-11-25 | Disposition: A | Discharge status: Home / Self Care | Payer: Medicare Other | Source: Ambulatory Visit | Attending: Physical Medicine & Rehabilitation | Admitting: Physical Medicine & Rehabilitation

## 2010-11-25 ENCOUNTER — Other Ambulatory Visit: Payer: Self-pay | Admitting: Physical Medicine & Rehabilitation

## 2010-11-25 ENCOUNTER — Ambulatory Visit: Payer: Medicare Other | Admitting: Internal Medicine

## 2010-11-25 ENCOUNTER — Encounter (HOSPITAL_COMMUNITY): Payer: Self-pay

## 2010-11-25 ENCOUNTER — Ambulatory Visit (HOSPITAL_COMMUNITY)
Admission: RE | Admit: 2010-11-25 | Discharge: 2010-11-25 | Disposition: A | Discharge status: Home / Self Care | Payer: Medicare Other | Source: Ambulatory Visit | Attending: Internal Medicine | Admitting: Internal Medicine

## 2010-11-25 DIAGNOSIS — M549 Dorsalgia, unspecified: Secondary | ICD-10-CM

## 2010-11-25 DIAGNOSIS — J984 Other disorders of lung: Secondary | ICD-10-CM | POA: Insufficient documentation

## 2010-11-25 DIAGNOSIS — K7689 Other specified diseases of liver: Secondary | ICD-10-CM | POA: Insufficient documentation

## 2010-11-25 DIAGNOSIS — R0602 Shortness of breath: Secondary | ICD-10-CM | POA: Insufficient documentation

## 2010-11-25 DIAGNOSIS — R05 Cough: Secondary | ICD-10-CM | POA: Insufficient documentation

## 2010-11-25 DIAGNOSIS — D3A00 Benign carcinoid tumor of unspecified site: Secondary | ICD-10-CM

## 2010-11-25 DIAGNOSIS — R059 Cough, unspecified: Secondary | ICD-10-CM | POA: Insufficient documentation

## 2010-11-25 DIAGNOSIS — D381 Neoplasm of uncertain behavior of trachea, bronchus and lung: Secondary | ICD-10-CM

## 2010-11-25 DIAGNOSIS — D649 Anemia, unspecified: Secondary | ICD-10-CM

## 2010-11-25 LAB — CMP (CANCER CENTER ONLY)
AST: 29 U/L (ref 11–38)
Albumin: 3.3 g/dL (ref 3.3–5.5)
BUN, Bld: 18 mg/dL (ref 7–22)
Calcium: 8.9 mg/dL (ref 8.0–10.3)
Chloride: 97 mEq/L — ABNORMAL LOW (ref 98–108)
Glucose, Bld: 121 mg/dL — ABNORMAL HIGH (ref 73–118)
Potassium: 3.8 mEq/L (ref 3.3–4.7)

## 2010-11-25 LAB — CBC WITH DIFFERENTIAL/PLATELET
Basophils Absolute: 0 10*3/uL (ref 0.0–0.1)
Eosinophils Absolute: 0.1 10*3/uL (ref 0.0–0.5)
HGB: 12.2 g/dL (ref 11.6–15.9)
NEUT#: 3.8 10*3/uL (ref 1.5–6.5)
RDW: 14.2 % (ref 11.2–14.5)
lymph#: 1.2 10*3/uL (ref 0.9–3.3)

## 2010-11-25 MED ORDER — IOHEXOL 300 MG/ML  SOLN: 80.0000 mL | Freq: Once | INTRAMUSCULAR | Status: AC | PRN

## 2010-11-26 NOTE — Miscellaneous (Signed)
  Clinical Lists Changes pt on duplicate tx, per dr Eden Emms pt to stop amlodipine and increase nifedipine. spoke to the pharm Deliah Goody, RN  November 13, 2010 5:23 PM  Medications: Removed medication of AMLODIPINE BESYLATE 10 MG TABS (AMLODIPINE BESYLATE) 1 tab by mouth once daily Changed medication from NIFEDIPINE 30 MG XR24H-TAB (NIFEDIPINE) 1 tab by mouth once daily to NIFEDIPINE 60 MG XR24H-TAB (NIFEDIPINE) one tablet by mouth once daily - Signed Rx of NIFEDIPINE 60 MG XR24H-TAB (NIFEDIPINE) one tablet by mouth once daily;  #30 x 12;  Signed;  Entered by: Deliah Goody, RN;  Authorized by: Colon Branch, MD, [facility];  Method used: Electronically to [facility] # [phone]*, [address] [place], [place], [place]  [address], Ph: [phone], Fax: [phone]    Prescriptions: NIFEDIPINE 60 MG XR24H-TAB (NIFEDIPINE) one tablet by mouth once daily  #30 x 12   Entered by:   Deliah Goody, RN   Authorized by:   Colon Branch, MD, [facility]   Signed by:   Deliah Goody, RN on 11/13/2010   Method used:   Electronically to        [facility] [id number] (retail)       [address] [place], [place]  [address]       Ph: [phone]       Fax: [phone]   RxID:   [id number]

## 2010-11-26 NOTE — Progress Notes (Signed)
Summary: refill   Phone Note Refill Request Message from:  Patient on November 03, 2010 8:43 AM  Refills Requested: Medication #1:  BENICAR 40 MG TABS 1 tab by mouth two times a day Fax [phone]  Initial call taken by: Judie Grieve,  November 03, 2010 8:45 AM  Follow-up for Phone Call        faxed to pharmacy Follow-up by: Kem Parkinson,  November 03, 2010 9:02 AM    Prescriptions: BENICAR 40 MG TABS (OLMESARTAN MEDOXOMIL) 1 tab by mouth two times a day  #180 x 3   Entered by:   Kem Parkinson   Authorized by:   Colon Branch, MD, [facility]   Signed by:   Kem Parkinson on 11/03/2010   Method used:   Print then Give to Patient   RxID:   [id number] BENICAR 40 MG TABS (OLMESARTAN MEDOXOMIL) 1 tab by mouth two times a day  #180 x 3   Entered by:   Kem Parkinson   Authorized by:   Colon Branch, MD, [facility]   Signed by:   Kem Parkinson on 11/03/2010   Method used:   Print then Give to Patient   RxID:   [id number]

## 2010-11-26 NOTE — Letter (Signed)
Summary: [facility]   [employer]   Imported By: Marylou Mccoy 11/19/2010 10:17:27  _____________________________________________________________________  External Attachment:    Type:   Image     Comment:   External Document

## 2010-11-26 NOTE — Progress Notes (Signed)
Summary: rx refill   Phone Note Refill Request Message from:  Patient on November 10, 2010 9:36 AM  pt needs refill on all of her meds. please fax# [phone]. pt is aware to have the pharmacy to call the dr office because pt does not know what she needs.   Method Requested: Fax to Local Pharmacy Initial call taken by: Roe Coombs,  November 10, 2010 9:37 AM  Follow-up for Phone Call        faxed meds to pharmacy Follow-up by: Kem Parkinson,  November 10, 2010 10:05 AM    Prescriptions: NITROSTAT 0.4 MG SUBL (NITROGLYCERIN) as needed  #90 x 3   Entered by:   Kem Parkinson   Authorized by:   Colon Branch, MD, [facility]   Signed by:   Kem Parkinson on 11/10/2010   Method used:   Print then Give to Patient   RxID:   [id number] FUROSEMIDE 20 MG TABS (FUROSEMIDE) 1 tablet daily as needed for swelling  #90 x 3   Entered by:   Kem Parkinson   Authorized by:   Colon Branch, MD, [facility]   Signed by:   Kem Parkinson on 11/10/2010   Method used:   Print then Give to Patient   RxID:   [id number] ACTOS 45 MG TABS (PIOGLITAZONE HCL) 1 tab by mouth once daily  #0 x 0   Entered by:   Kem Parkinson   Authorized by:   Colon Branch, MD, [facility]   Signed by:   Kem Parkinson on 11/10/2010   Method used:   Print then Give to Patient   RxID:   [id number] NIFEDIPINE 30 MG XR24H-TAB (NIFEDIPINE) 1 tab by mouth once daily  #90 x 3   Entered by:   Kem Parkinson   Authorized by:   Colon Branch, MD, [facility]   Signed by:   Kem Parkinson on 11/10/2010   Method used:   Print then Give to Patient   RxID:   [id number] CRESTOR 5 MG TABS (ROSUVASTATIN CALCIUM) Take one tablet by mouth daily.  #90 x 3   Entered by:   Kem Parkinson   Authorized by:   Colon Branch, MD, [facility]   Signed by:   Kem Parkinson on 11/10/2010   Method used:   Print then Give to Patient   RxID:   [id number] BENICAR 40 MG TABS (OLMESARTAN  MEDOXOMIL) 1 tab by mouth two times a day  #180 x 3   Entered by:   Kem Parkinson   Authorized by:   Colon Branch, MD, [facility]   Signed by:   Kem Parkinson on 11/10/2010   Method used:   Print then Give to Patient   RxID:   [id number] AMLODIPINE BESYLATE 10 MG TABS (AMLODIPINE BESYLATE) 1 tab by mouth once daily  #90 x 3   Entered by:   Kem Parkinson   Authorized by:   Colon Branch, MD, [facility]   Signed by:   Kem Parkinson on 11/10/2010   Method used:   Print then Give to Patient   RxID:   [id number] POTASSIUM CHLORIDE CR 10 MEQ CR-CAPS (POTASSIUM CHLORIDE) 2 tab by mouth once daily  #180 x 3   Entered by:   Kem Parkinson   Authorized by:   Colon Branch, MD, [facility]   Signed by:   Kem Parkinson on 11/10/2010   Method used:   Print then Give to Patient   RxID:   [id number]

## 2010-11-26 NOTE — Progress Notes (Signed)
Summary: re pre auth on med   Phone Note Call from Patient   Caller: Patient Reason for Call: Talk to Nurse Summary of Call: pt calling re pre auth on benicar-need to fax to [phone]-or call to [phone] Initial call taken by: Glynda Jaeger,  November 12, 2010 10:16 AM  Follow-up for Phone Call        left a message for pt, paperwork faxed today Deliah Goody, RN  November 12, 2010 4:48 PM

## 2010-11-27 ENCOUNTER — Ambulatory Visit: Admit: 2010-11-27 | Payer: Self-pay | Admitting: Physical Medicine & Rehabilitation

## 2010-11-27 ENCOUNTER — Ambulatory Visit: Payer: Self-pay | Admitting: Physical Medicine & Rehabilitation

## 2010-11-27 ENCOUNTER — Ambulatory Visit: Payer: Medicare Other | Attending: Internal Medicine

## 2010-12-01 ENCOUNTER — Ambulatory Visit: Payer: Self-pay | Admitting: Cardiovascular Disease

## 2010-12-07 ENCOUNTER — Encounter: Payer: Self-pay | Admitting: Cardiovascular Disease

## 2010-12-08 ENCOUNTER — Encounter: Payer: Self-pay | Admitting: Cardiovascular Disease

## 2010-12-08 ENCOUNTER — Ambulatory Visit (INDEPENDENT_AMBULATORY_CARE_PROVIDER_SITE_OTHER): Payer: Medicare Other | Admitting: Cardiovascular Disease

## 2010-12-08 DIAGNOSIS — I10 Essential (primary) hypertension: Secondary | ICD-10-CM

## 2010-12-08 DIAGNOSIS — I719 Aortic aneurysm of unspecified site, without rupture: Secondary | ICD-10-CM

## 2010-12-10 ENCOUNTER — Other Ambulatory Visit: Payer: Self-pay | Admitting: Internal Medicine

## 2010-12-10 ENCOUNTER — Encounter (HOSPITAL_BASED_OUTPATIENT_CLINIC_OR_DEPARTMENT_OTHER): Payer: Medicare Other | Admitting: Internal Medicine

## 2010-12-10 DIAGNOSIS — D381 Neoplasm of uncertain behavior of trachea, bronchus and lung: Secondary | ICD-10-CM

## 2010-12-10 DIAGNOSIS — D3A090 Benign carcinoid tumor of the bronchus and lung: Secondary | ICD-10-CM

## 2010-12-10 DIAGNOSIS — D649 Anemia, unspecified: Secondary | ICD-10-CM

## 2010-12-10 DIAGNOSIS — C3490 Malignant neoplasm of unspecified part of unspecified bronchus or lung: Secondary | ICD-10-CM

## 2010-12-15 ENCOUNTER — Ambulatory Visit (INDEPENDENT_AMBULATORY_CARE_PROVIDER_SITE_OTHER): Payer: Medicare Other | Admitting: Thoracic Surgery

## 2010-12-15 DIAGNOSIS — C3410 Malignant neoplasm of upper lobe, unspecified bronchus or lung: Secondary | ICD-10-CM

## 2010-12-16 NOTE — Assessment & Plan Note (Signed)
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  I reviewed a letter from Dr. Edwyna Shell indicating that her ascending aorta had reached 5 cm.  She was referred to [facility] for further evaluation.  She does not strike me as an ideal operative candidate.  Her hypertension has been under good control. She has had her amlodimpine stopped and Procardia increased to 60 mg which I think is fine.   She has significant lower extremity edema from varicose veins and this is harder to treat while on this particular oral hypoglycemic.  Otherwise she's not had any significant chest pain.  She has mild chronic edema she has mild chronic exertional dyspnea.  I believe she sees Dr. Bernette Mayers for her carcinoid. I reviewed her CT from  last August and the ascending Ao is stable at 5.0cm  with stable metastatic carcinoid burden   Current Problems (verified): 1)  Hypertension  (ICD-401.9) 2)  Cad  (ICD-414.00) 3)  Aortic Insufficiency  (ICD-424.1) 4)  Hyperlipidemia  (ICD-272.4) 5)  Aortic Aneurysm  (ICD-441.9) 6)  Hypercholesterolemia  (ICD-272.0) 7)  Dm  (ICD-250.00) 8)  COPD  (ICD-496) 9)  Bronchitis  (ICD-490) 10)  Edema  (ICD-782.3) 11)  Gerd  (ICD-530.81)  Current Medications (verified): 1)  Nitrostat 0.4 Mg Subl (Nitroglycerin) .... As Needed 2)  Omeprazole 40 Mg Cpdr (Omeprazole) .Marland Kitchen.. 1 Tab By Mouth Once Daily 3)  Indapamide 2.5 Mg Tabs (Indapamide) .... One Tab By Mouth Once Daily 4)  Aspirin 81 Mg  Tabs (Aspirin) .Marland Kitchen.. 1 Tab By Mouth Once Daily 5)  Ginkgo Biloba   Extr (Ginkgo Biloba) .Marland Kitchen.. 1 Tab By Mouth Once Daily 6)  Multivitamins   Tabs (Multiple Vitamin) .Marland Kitchen.. 1 Tab By Mouth Once  Daily 7)  Tylenol 325 Mg Tabs (Acetaminophen) .... As Needed 8)  Potassium Chloride Cr 10 Meq Cr-Caps (Potassium Chloride) .... 2 Tab By Mouth Once Daily 9)  Integra Plus  Caps (Fefum-Fepoly-Fa-B Cmp-C-Biot) .Marland Kitchen.. 1 Tab By Mouth Once Daily 10)  Glimepiride 1 Mg Tabs (Glimepiride) .Marland Kitchen.. 1 Tab By Mouth Once Daily 11)  Tussionex Pennkinetic Er 8-10 Mg/82ml Lqcr (Chlorpheniramine-Hydrocodone) .... As Needed 12)  Spiriva Handihaler 18 Mcg Caps (Tiotropium Bromide Monohydrate) .... Once Daily 13)  Advair Diskus 100-50 Mcg/dose Misc (Fluticasone-Salmeterol) .... Once Daily 14)  Proventil Hfa 108 (90 Base) Mcg/act Aers (Albuterol Sulfate) .... Once Daily 15)  Flonase 50 Mcg/act Susp (Fluticasone Propionate) .... Once Daily 16)  Singulair 10 Mg Tabs (Montelukast Sodium) .Marland Kitchen.. 1 Once Daily 17)  Aristocort Cream .... As Needed 18)  Benicar 40 Mg Tabs (Olmesartan Medoxomil) .Marland Kitchen.. 1 Tab By Mouth Two Times A Day 19)  Crestor 5 Mg Tabs (Rosuvastatin Calcium) .... Take One Tablet By Mouth Daily. 20)  Actos 45 Mg Tabs (Pioglitazone Hcl) .Marland Kitchen.. 1 Tab By Mouth Once Daily 21)  Nifedipine 60 Mg Xr24h-Tab (Nifedipine) .... One Tablet By Mouth Once Daily 22)  Furosemide 20 Mg Tabs (Furosemide) .Marland Kitchen.. 1 Tablet Daily As Needed For Swelling 23)  Nifedipine Cr Osmotic 60 Mg Xr24h-Tab (Nifedipine) .Marland Kitchen.. 1 Tab By Mouth Once Daily  Allergies (verified): No Known Drug Allergies  Past History:  Past Medical History: Last updated: 06/19/2009 Current Problems:  HYPERTENSION (ICD-401.9) CAD (ICD-414.00) AORTIC INSUFFICIENCY (ICD-424.1)  HYPERLIPIDEMIA (ICD-272.4) AORTIC ANEURYSM (ICD-441.9) HYPERCHOLESTEROLEMIA (ICD-272.0) DM (ICD-250.00) COPD (ICD-496) BRONCHITIS (ICD-490) EDEMA (ICD-782.3) GERD (ICD-530.81) carcinoid tumor of lung   Stable aortic valve disease  Past Surgical History: Last updated: 06/19/2009 Pars plana vitrectomy, right eye; posterior capsulectomy, right eye; endophotocoagulation, right  eye.  Family History: Last updated: 06/19/2009 non-contribry  Social History: Last updated: 06/19/2009 Married From [place] Lives with daughter Non-smoker Non-drinker  Review of Systems       Denies fever, malais, weight loss, blurry vision, decreased visual acuity, cough, sputum, SOB, hemoptysis, pleuritic pain, palpitaitons, heartburn, abdominal pain, melena, lower extremity edema, claudication, or rash.   Vital Signs:  Patient profile:   77 year old female Height:      65 inches Weight:      188 pounds BMI:     31.40 Pulse rate:   84 / minute Resp:     12 per minute BP sitting:   133 / 77  (left arm)  Vitals Entered By: Kem Parkinson (December 08, 2010 3:11 PM)  Physical Exam  General:  Affect appropriate Healthy:  appears stated age HEENT: normal Neck supple with no adenopathy JVP normal no bruits no thyromegaly Lungs clear with no wheezing and good diaphragmatic motion Heart:  S1/S2  SEM and AR no,rub, gallop or click PMI normal Abdomen: benighn, BS positve, no tenderness, no AAA no bruit.  No HSM or HJR Distal pulses intact with no bruits Trace bilateral  edema with varicosities Neuro non-focal Skin warm and dry    Impression & Recommendations:  Problem # 1:  HYPERTENSION (ICD-401.9) Well controlled Her updated medication list for this problem includes:    Indapamide 2.5 Mg Tabs (Indapamide) ..... One tab by mouth once daily    Aspirin 81 Mg Tabs (Aspirin) .Marland Kitchen... 1 tab by mouth once daily    Benicar 40 Mg Tabs (Olmesartan medoxomil) .Marland Kitchen... 1 tab by mouth two times a day    Nifedipine 60 Mg Xr24h-tab (Nifedipine) ..... One tablet by mouth once daily    Furosemide 20 Mg Tabs (Furosemide) .Marland Kitchen... 1 tablet daily as needed for swelling    Nifedipine Cr Osmotic 60 Mg Xr24h-tab (Nifedipine) .Marland Kitchen... 1 tab by mouth once daily  Problem # 2:  AORTIC INSUFFICIENCY (ICD-424.1) F/U echo no change in murmur.  Likely from aortic root dilatation Her updated medication  list for this problem includes:    Nitrostat 0.4 Mg Subl (Nitroglycerin) .Marland Kitchen... As needed    Indapamide 2.5 Mg Tabs (Indapamide) ..... One tab by mouth once daily    Benicar 40 Mg Tabs (Olmesartan medoxomil) .Marland Kitchen... 1 tab by mouth two times a day    Furosemide 20 Mg Tabs (Furosemide) .Marland Kitchen... 1 tablet daily as needed for swelling  Orders: Echocardiogram (Echo)  Problem # 3:  AORTIC ANEURYSM (ICD-441.9) F/U Dr Edwyna Shell.  CT ? scheduled for next month  Problem # 4:  HYPERCHOLESTEROLEMIA (ICD-272.0) Continue crestor labs per primary Her updated medication list for this problem includes:    Crestor 5 Mg Tabs (Rosuvastatin calcium) .Marland Kitchen... Take one tablet by mouth daily.  Patient Instructions: 1)  Your physician wants you to follow-up in: 6 MONTHS  You will receive a reminder letter in the mail two months in advance. If you don't receive a letter, please call our office to schedule the follow-up appointment. 2)  Your physician has requested that you have an echocardiogram.  Echocardiography is a painless test that uses sound waves to create images of your heart. It provides your doctor with information about  the size and shape of your heart and how well your heart's chambers and valves are working.  This procedure takes approximately one hour. There are no restrictions for this procedure.

## 2011-01-01 ENCOUNTER — Encounter: Payer: Medicare Other | Attending: Physical Medicine & Rehabilitation

## 2011-01-01 ENCOUNTER — Ambulatory Visit: Payer: Medicare Other | Admitting: Physical Medicine & Rehabilitation

## 2011-01-01 DIAGNOSIS — M4120 Other idiopathic scoliosis, site unspecified: Secondary | ICD-10-CM | POA: Insufficient documentation

## 2011-01-01 DIAGNOSIS — M545 Low back pain, unspecified: Secondary | ICD-10-CM

## 2011-01-01 DIAGNOSIS — M25559 Pain in unspecified hip: Secondary | ICD-10-CM

## 2011-01-01 DIAGNOSIS — M47817 Spondylosis without myelopathy or radiculopathy, lumbosacral region: Secondary | ICD-10-CM | POA: Insufficient documentation

## 2011-01-05 NOTE — Medication Information (Signed)
Summary: prescriptions  prescriptions   Imported By: Kassie Mends 12/30/2010 10:01:39  _____________________________________________________________________  External Attachment:    Type:   Image     Comment:   External Document

## 2011-01-05 NOTE — Letter (Signed)
Summary: [facility]  [facility]   Imported By: Marylou Mccoy 12/25/2010 11:48:11  _____________________________________________________________________  External Attachment:    Type:   Image     Comment:   External Document

## 2011-01-07 LAB — GLUCOSE, CAPILLARY: Glucose-Capillary: 106 mg/dL — ABNORMAL HIGH (ref 70–99)

## 2011-01-07 LAB — COMPREHENSIVE METABOLIC PANEL
Albumin: 3.9 g/dL (ref 3.5–5.2)
Alkaline Phosphatase: 85 U/L (ref 39–117)
BUN: 18 mg/dL (ref 6–23)
CO2: 33 mEq/L — ABNORMAL HIGH (ref 19–32)
Chloride: 100 mEq/L (ref 96–112)
GFR calc non Af Amer: >60 mL/min (ref >60)
Glucose, Bld: 124 mg/dL — ABNORMAL HIGH (ref 70–99)
Potassium: 4 mEq/L (ref 3.5–5.1)
Total Bilirubin: 0.6 mg/dL (ref 0.3–1.2)

## 2011-01-07 LAB — CBC
HCT: 37.5 % (ref 36.0–46.0)
Hemoglobin: 12.5 g/dL (ref 12.0–15.0)
MCV: 92.8 fL (ref 78.0–100.0)
RBC: 4.04 MIL/uL (ref 3.87–5.11)
WBC: 6.2 10*3/uL (ref 4.0–10.5)

## 2011-01-07 LAB — CULTURE, RESPIRATORY W GRAM STAIN

## 2011-01-07 LAB — PROTIME-INR
INR: 1.06 (ref 0.00–1.49)
Prothrombin Time: 14 seconds (ref 11.6–15.2)

## 2011-01-12 ENCOUNTER — Other Ambulatory Visit (HOSPITAL_COMMUNITY): Payer: Medicare Other

## 2011-01-13 LAB — COMPREHENSIVE METABOLIC PANEL
Albumin: 4 g/dL (ref 3.5–5.2)
BUN: 35 mg/dL — ABNORMAL HIGH (ref 6–23)
CO2: 30 mEq/L (ref 19–32)
Chloride: 100 mEq/L (ref 96–112)
Creatinine, Ser: 0.9 mg/dL (ref 0.4–1.2)
GFR calc non Af Amer: >60 mL/min (ref >60)
Total Bilirubin: 0.8 mg/dL (ref 0.3–1.2)

## 2011-01-13 LAB — CBC
HCT: 36.2 % (ref 36.0–46.0)
MCV: 92 fL (ref 78.0–100.0)
Platelets: 192 10*3/uL (ref 150–400)
WBC: 6.2 10*3/uL (ref 4.0–10.5)

## 2011-01-13 LAB — GLUCOSE, CAPILLARY: Glucose-Capillary: 98 mg/dL (ref 70–99)

## 2011-01-13 LAB — APTT: aPTT: 30 seconds (ref 24–37)

## 2011-01-13 LAB — PROTIME-INR: INR: 1.07 (ref 0.00–1.49)

## 2011-01-20 ENCOUNTER — Other Ambulatory Visit (HOSPITAL_COMMUNITY): Payer: Self-pay | Admitting: Cardiovascular Disease

## 2011-01-20 DIAGNOSIS — I351 Nonrheumatic aortic (valve) insufficiency: Secondary | ICD-10-CM

## 2011-01-22 ENCOUNTER — Ambulatory Visit (HOSPITAL_COMMUNITY): Payer: Medicare Other | Attending: Cardiovascular Disease | Admitting: Radiology

## 2011-01-22 DIAGNOSIS — I351 Nonrheumatic aortic (valve) insufficiency: Secondary | ICD-10-CM

## 2011-01-22 DIAGNOSIS — I359 Nonrheumatic aortic valve disorder, unspecified: Secondary | ICD-10-CM | POA: Insufficient documentation

## 2011-01-26 LAB — COMPREHENSIVE METABOLIC PANEL
Alkaline Phosphatase: 71 U/L (ref 39–117)
BUN: 19 mg/dL (ref 6–23)
Chloride: 100 mEq/L (ref 96–112)
Glucose, Bld: 111 mg/dL — ABNORMAL HIGH (ref 70–99)
Potassium: 3.9 mEq/L (ref 3.5–5.1)
Total Bilirubin: 0.8 mg/dL (ref 0.3–1.2)

## 2011-01-26 LAB — BASIC METABOLIC PANEL
Calcium: 8.8 mg/dL (ref 8.4–10.5)
GFR calc Af Amer: >60 mL/min (ref >60)
GFR calc non Af Amer: >60 mL/min (ref >60)
Glucose, Bld: 110 mg/dL — ABNORMAL HIGH (ref 70–99)
Potassium: 3.6 mEq/L (ref 3.5–5.1)
Sodium: 141 mEq/L (ref 135–145)

## 2011-01-26 LAB — FUNGUS CULTURE W SMEAR: Fungal Smear: NONE SEEN

## 2011-01-26 LAB — CBC
HCT: 33 % — ABNORMAL LOW (ref 36.0–46.0)
HCT: 37.5 % (ref 36.0–46.0)
Hemoglobin: 11.4 g/dL — ABNORMAL LOW (ref 12.0–15.0)
Hemoglobin: 12.9 g/dL (ref 12.0–15.0)
RBC: 3.58 MIL/uL — ABNORMAL LOW (ref 3.87–5.11)
RBC: 4.06 MIL/uL (ref 3.87–5.11)
WBC: 6.1 10*3/uL (ref 4.0–10.5)
WBC: 6.8 10*3/uL (ref 4.0–10.5)

## 2011-01-26 LAB — AFB CULTURE WITH SMEAR (NOT AT ARMC)

## 2011-01-26 LAB — CULTURE, RESPIRATORY W GRAM STAIN: Culture: NO GROWTH

## 2011-01-26 LAB — GLUCOSE, CAPILLARY
Glucose-Capillary: 100 mg/dL — ABNORMAL HIGH (ref 70–99)
Glucose-Capillary: 141 mg/dL — ABNORMAL HIGH (ref 70–99)
Glucose-Capillary: 70 mg/dL (ref 70–99)

## 2011-01-26 LAB — PROTIME-INR
INR: 1.07 (ref 0.00–1.49)
Prothrombin Time: 13.8 seconds (ref 11.6–15.2)

## 2011-01-27 LAB — APTT: aPTT: 32 seconds (ref 24–37)

## 2011-01-27 LAB — AFB CULTURE WITH SMEAR (NOT AT ARMC): Acid Fast Smear: NONE SEEN

## 2011-01-27 LAB — CULTURE, RESPIRATORY W GRAM STAIN

## 2011-01-27 LAB — COMPREHENSIVE METABOLIC PANEL
ALT: 22 U/L (ref 0–35)
Alkaline Phosphatase: 74 U/L (ref 39–117)
BUN: 20 mg/dL (ref 6–23)
Chloride: 100 mEq/L (ref 96–112)
Glucose, Bld: 110 mg/dL — ABNORMAL HIGH (ref 70–99)
Potassium: 3.9 mEq/L (ref 3.5–5.1)
Sodium: 138 mEq/L (ref 135–145)
Total Bilirubin: 0.5 mg/dL (ref 0.3–1.2)
Total Protein: 7.3 g/dL (ref 6.0–8.3)

## 2011-01-27 LAB — CBC
HCT: 36.4 % (ref 36.0–46.0)
Hemoglobin: 12.6 g/dL (ref 12.0–15.0)
RBC: 3.96 MIL/uL (ref 3.87–5.11)
WBC: 6.8 10*3/uL (ref 4.0–10.5)

## 2011-01-27 LAB — FUNGUS CULTURE W SMEAR: Fungal Smear: NONE SEEN

## 2011-01-27 LAB — GLUCOSE, CAPILLARY

## 2011-01-27 LAB — PROTIME-INR
INR: 1.05 (ref 0.00–1.49)
Prothrombin Time: 13.6 seconds (ref 11.6–15.2)

## 2011-01-29 ENCOUNTER — Other Ambulatory Visit: Payer: Self-pay | Admitting: Thoracic Surgery

## 2011-01-29 DIAGNOSIS — R911 Solitary pulmonary nodule: Secondary | ICD-10-CM

## 2011-02-01 NOTE — Assessment & Plan Note (Signed)
OFFICE VISIT  JOCELYNE, REINERTSEN DOB:  01-08-1932                                        December 15, 2010 CHART #:  [id number]  Ms. Munger came today.  She is still having a chronic cough.  Her blood pressure is 136/82, pulse 98, respirations 20, sats were 94%.  She did not get the CT scan that was scheduled for today.  She has been stable overall.  I do think we need to get a followup regarding her multiple carcinoid syndrome.  I plan to repeat this again at her next visit, but I will wait another 3 months prior to doing this since she is not having any symptoms.  Ines Bloomer, M.D. Electronically Signed  DPB/MEDQ  D:  12/15/2010  T:  12/15/2010  Job:  [id number]

## 2011-02-08 ENCOUNTER — Encounter: Payer: Self-pay | Admitting: *Deleted

## 2011-02-11 ENCOUNTER — Encounter: Payer: Medicare Other | Admitting: Physical Medicine & Rehabilitation

## 2011-02-11 ENCOUNTER — Encounter: Payer: Medicare Other | Attending: Physical Medicine & Rehabilitation

## 2011-02-11 DIAGNOSIS — M47817 Spondylosis without myelopathy or radiculopathy, lumbosacral region: Secondary | ICD-10-CM | POA: Insufficient documentation

## 2011-02-11 DIAGNOSIS — M4120 Other idiopathic scoliosis, site unspecified: Secondary | ICD-10-CM | POA: Insufficient documentation

## 2011-02-11 DIAGNOSIS — R279 Unspecified lack of coordination: Secondary | ICD-10-CM

## 2011-02-11 NOTE — Assessment & Plan Note (Signed)
CHIEF COMPLAINT:  Falls.  A 77 year old female with chief complaint of back pain.  She was treated previously for wrist pain, was scheduled for medial branch block today, but she states her back is feeling better.  She had a fall in her doctor's office and in fact seems to be following more often these days. She denies any dizziness or loss of consciousness.  She really does not know why she falls.  Last time she fell was in the bathroom in her doctor's office, hitting her head and her left shoulder.  REVIEW OF MEDICATIONS:  She is really not taking any medicines that would predispose her to any falls.  She takes Ultracet for pain, but mainly uses Tylenol.  Med list has been reviewed.  She is on antihypertensives, but once again does not complain of any dizziness.  PAST HISTORY:  Negative for stroke.  Positive for carcinoid tumors alone.  PHYSICAL EXAMINATION:  VITAL SIGNS:  Blood pressure 152/57, pulse 73, respirations 18, O2 sat 95% on room air. MUSCULOSKELETAL:  Her back has no tenderness to palpation.  She is 75% forward flexion, 50% extension lumbar spine.  She really does not lose her balance when she extends her spine.  She can ambulate with short steps, widened base support.  Her left shoulder has full range of motion without pain.  IMPRESSION: 1. Balance disorder, likely musculoskeletal, this is the majority of     her problem.  I do not suspect any orthostasis or syncope.  Her     lumbar x-ray did show moderate degenerative joint disease.  She did     have some indistinctness of the endplates at T11-12 disk, but     really no pain around that area nothing to suggest infection     clinically. 2. Lumbar spondylosis, symptomatically improved.  No medial branch     blocks today, but does have intermittent pain, has responded to     TENS unit in the past, it is thought her husband's, would like her     own, I think this is reasonable.  PLAN:  We will set her up for balance  program at [facility].  We will also ask them to eval her for TENS unit.  I will see her back in 1 month.     Erick Colace, M.D. Electronically Signed    AEK/MedQ D:  02/11/2011 09:56:16  T:  02/11/2011 22:11:38  Job #:  [id number]  cc:   Ines Bloomer, M.D. [address] [place], [place] [address]  Dr. Erlinda Hong

## 2011-02-17 IMAGING — CR DG KNEE 1-2V*L*
3 series · 3 of 3 positions shown · non-contrast
Comparison: None available.

CLINICAL DATA: Status post fall 08/26/2009.  Pain.

LEFT KNEE - 1-2 VIEW

[t knee ap left]
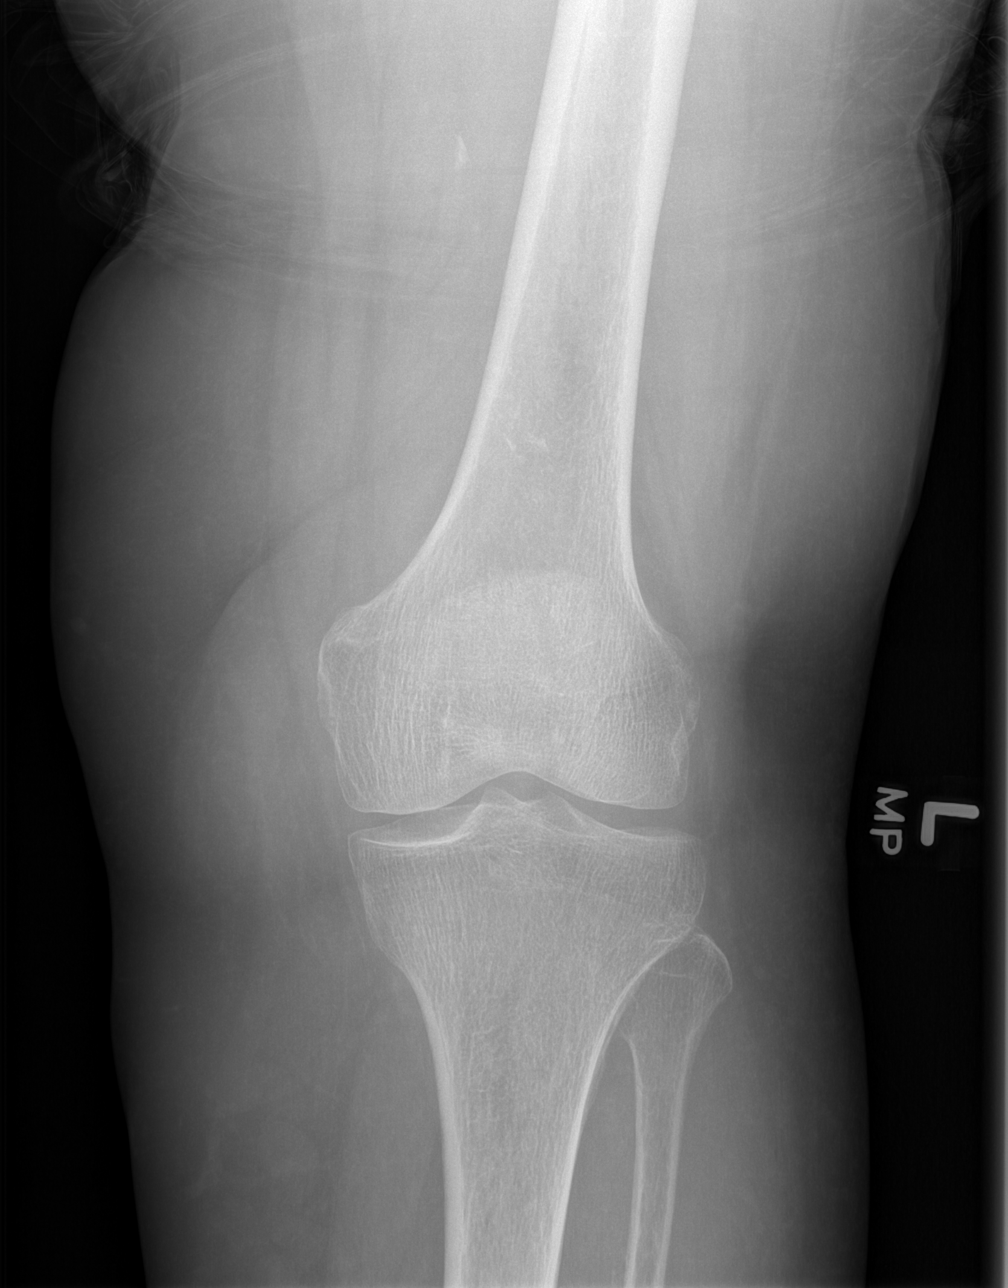

[t knee lat left (1 of 2)]
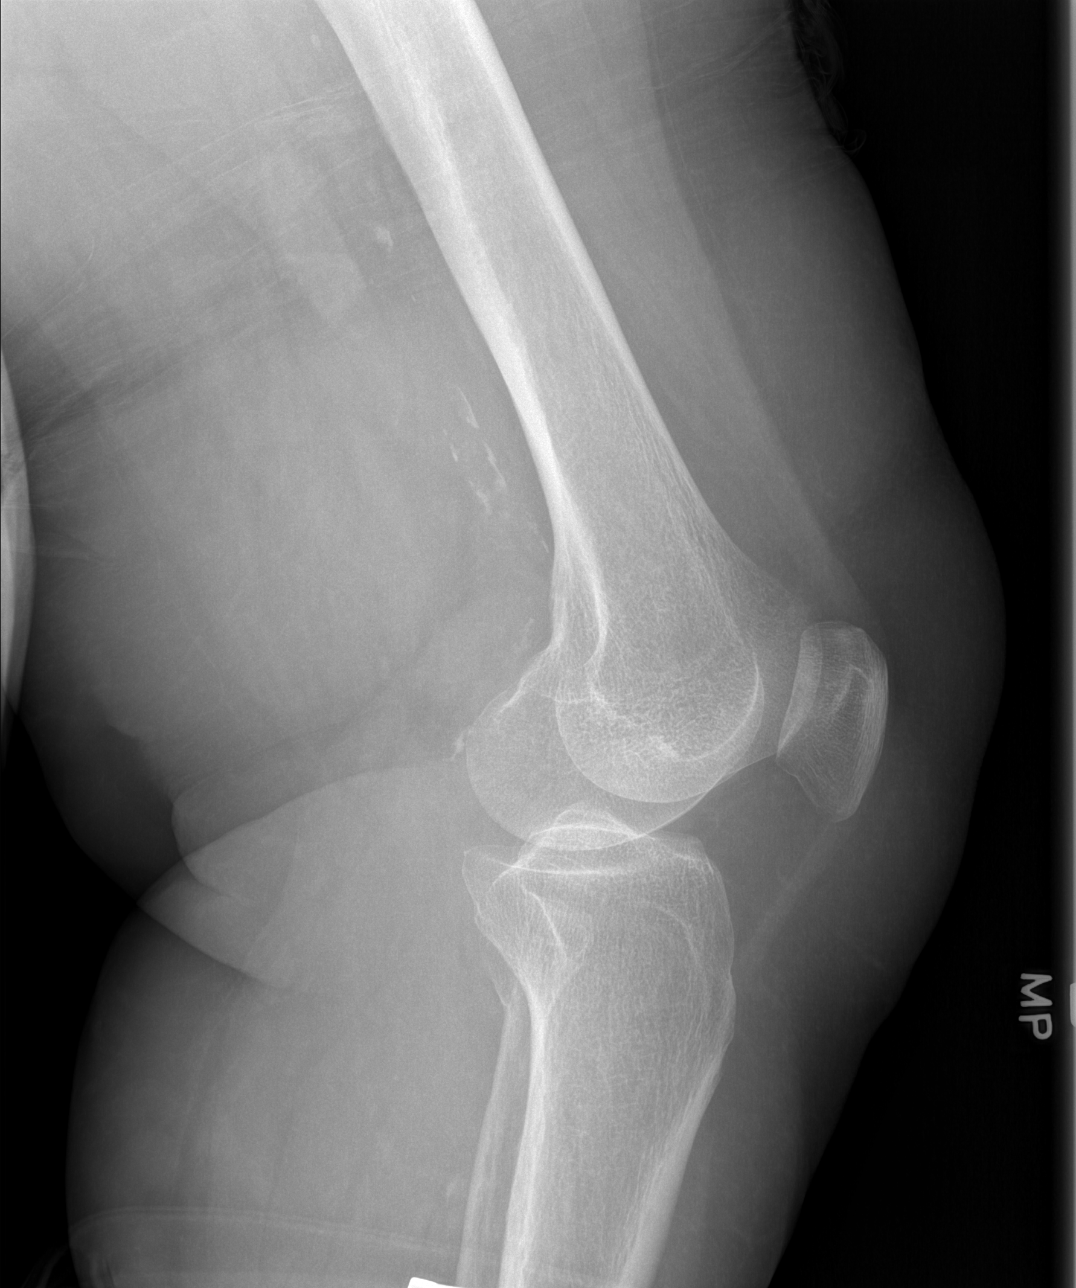

[t knee lat left (2 of 2)]
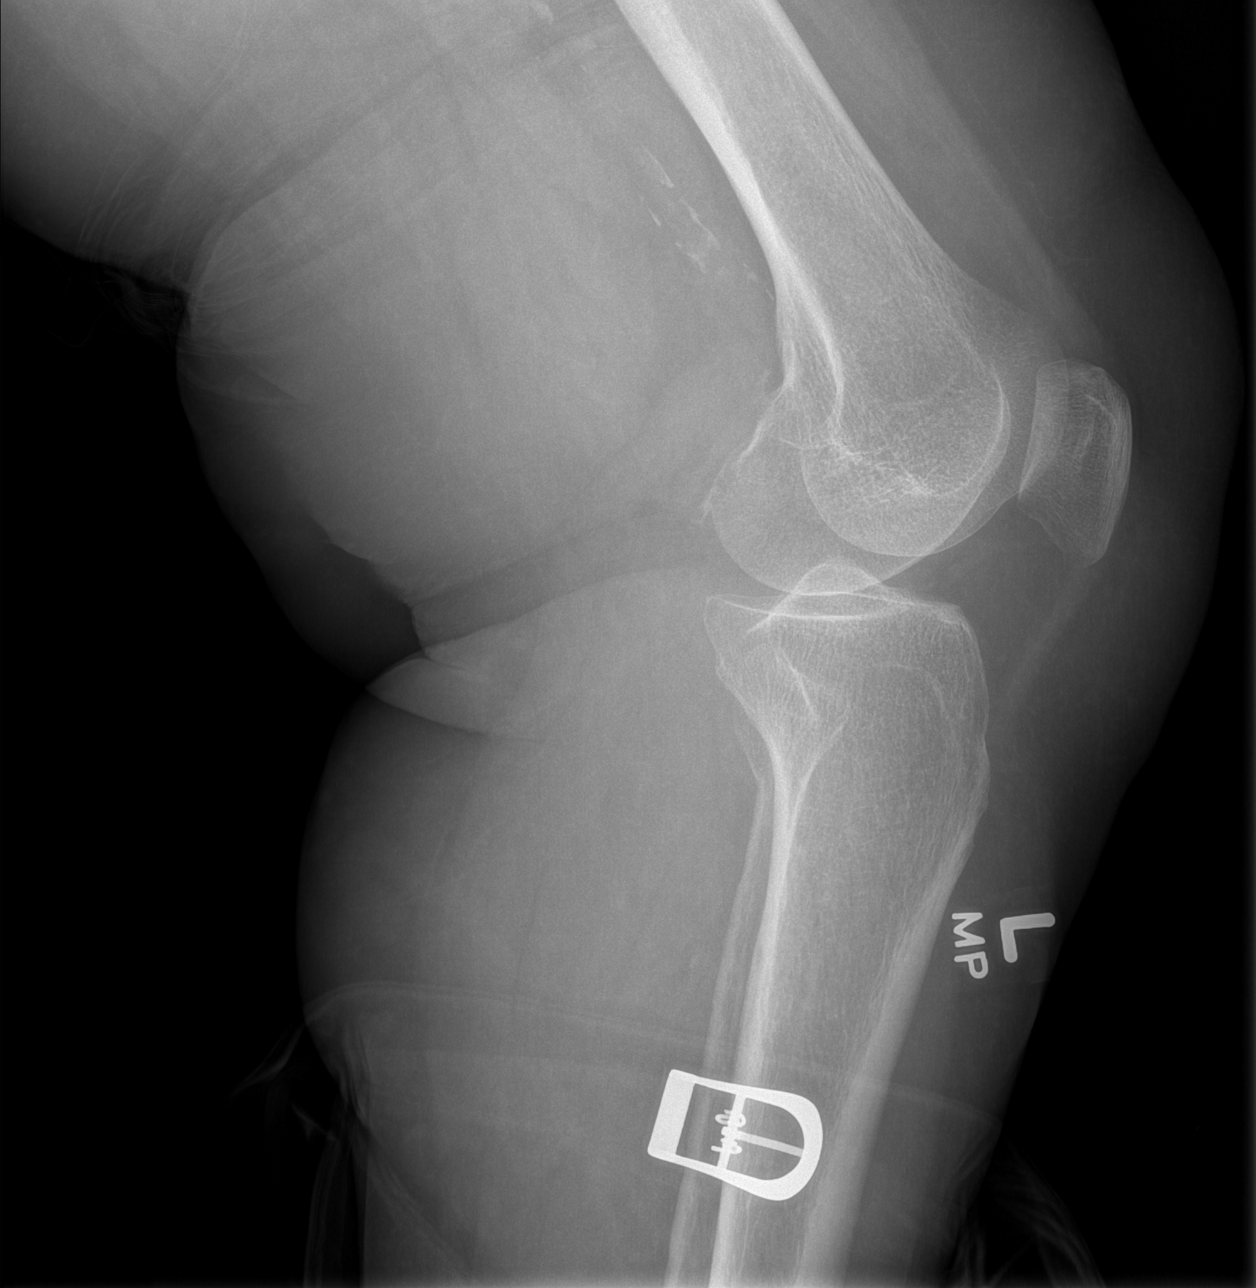

[3 of 3 positions shown; findings below may reference images not displayed]

FINDINGS: There is no acute bony or joint abnormality.  No notable
degenerative change.  Atherosclerotic vascular disease noted.
IMPRESSION: No acute finding.

## 2011-02-21 ENCOUNTER — Other Ambulatory Visit: Payer: Self-pay | Admitting: Cardiovascular Disease

## 2011-03-02 ENCOUNTER — Ambulatory Visit: Payer: Medicare Other | Attending: Physical Medicine & Rehabilitation | Admitting: Physical Therapy

## 2011-03-02 DIAGNOSIS — M6281 Muscle weakness (generalized): Secondary | ICD-10-CM | POA: Insufficient documentation

## 2011-03-02 DIAGNOSIS — R5381 Other malaise: Secondary | ICD-10-CM | POA: Insufficient documentation

## 2011-03-02 DIAGNOSIS — IMO0001 Reserved for inherently not codable concepts without codable children: Secondary | ICD-10-CM | POA: Insufficient documentation

## 2011-03-02 DIAGNOSIS — R293 Abnormal posture: Secondary | ICD-10-CM | POA: Insufficient documentation

## 2011-03-03 ENCOUNTER — Ambulatory Visit (INDEPENDENT_AMBULATORY_CARE_PROVIDER_SITE_OTHER): Payer: Medicare Other | Admitting: Thoracic Surgery

## 2011-03-03 ENCOUNTER — Ambulatory Visit
Admission: RE | Admit: 2011-03-03 | Discharge: 2011-03-03 | Disposition: A | Discharge status: Home / Self Care | Payer: Medicare Other | Source: Ambulatory Visit | Attending: Thoracic Surgery | Admitting: Thoracic Surgery

## 2011-03-03 DIAGNOSIS — R911 Solitary pulmonary nodule: Secondary | ICD-10-CM

## 2011-03-03 DIAGNOSIS — C3490 Malignant neoplasm of unspecified part of unspecified bronchus or lung: Secondary | ICD-10-CM

## 2011-03-04 NOTE — Assessment & Plan Note (Signed)
OFFICE VISIT  [name], [name] DOB:  22-Sep-1932                                        Mar 03, 2011 CHART #:  [id number]  The patient came today.  Her blood pressure is 161/76, pulse 64, respirations 20, sats were 94%.  Her chest CT still shows multiple pulmonary nodules with no change.  No evidence of recurrence of the endobronchial thyroid carcinoid nodule.  She has an occasional cough, but nothing worse.  We will continue to follow her with another CT scan in 6 months.  She looks very good.  Ines Bloomer, M.D. Electronically Signed  DPB/MEDQ  D:  03/03/2011  T:  03/04/2011  Job:  [id number]

## 2011-03-09 ENCOUNTER — Encounter: Payer: Medicare Other | Admitting: Physical Therapy

## 2011-03-09 NOTE — Assessment & Plan Note (Signed)
[facility]                            CARDIOLOGY OFFICE NOTE   NAME:[name], [name] COSTELLO                        MRN:          [id number]  DATE:07/10/2008                            DOB:          [birth year]    [name] returns today for followup.  Unfortunately, she is no longer  being seen by Dr. Posey Rea.  She sees a Dr. Bertha Stakes I believe in [place]  or [facility].  She has a few ongoing medical problems.  She has had  multiple carcinoid syndrome with pulmonary nodules.  She sees Dr. Edwyna Shell  for this.  She has had stable CT scans.  She has an ascending aortic  aneurysm, which measures about 4.5 cm, which is also stable by CT.  She  has a longstanding hypertension.  We had tried her on a myriad of other  ARBs and ACE inhibitors in the past and she has done well over the last  5 years with Benicar 40 mg.  Unfortunately, apparently her insurance  will no longer pay for it, I told her I would dictate a letter to her  insurance company regarding the importance of this particular medicine  and not substituting it.  Otherwise, she has been doing well.  She is  not having significant chest pain, PND, orthopnea, and there has been no  lower extremity edema.   REVIEW OF SYSTEMS:  Remarkable for history of murmur.  Her last  echocardiogram was done in April.  She has mild aortic insufficiency,  mild aortic stenosis, which is stable with good LV function.   CURRENT MEDICATIONS:  1. Ginkgo biloba.  2. Norvasc 10 mg a day.  3. Glimepiride 2 mg a day.  4. Potassium 10 a day.  5. Omeprazole 40 a day.  6. Indapamide 2.5 a day.  7. Tussionex.  8. Actos 45 a day.  9. Lipitor 10 a day.  10.Advair.  11.Integrilin once daily.  12.Benicar 40 b.i.d.   PHYSICAL EXAMINATION:  GENERAL:  Remarkable for an overweight [place]  European female in no distress.  VITAL SIGNS:  Blood pressure is 130/70, pulse 70 and regular, and  respiratory 14, afebrile.  AFFECT:  Anxious  about stopping her Benicar.  HEENT:  Unremarkable.  NECK:  Carotids normal without bruit.  No lymphadenopathy, thyromegaly,  or JVP elevation.  LUNGS:  Clear.  Good diaphragmatic motion.  No wheezing.  CARDIAC:  S1 and S2 with a systolic ejection murmur.  Mild AS and mild  aortic insufficiency is also present.  PMI normal.  ABDOMEN:  Benign.  Bowel sounds positive.  No AAA, no tenderness, no  bruit, no hepatosplenomegaly, or hepatojugular reflux.  EXTREMITIES:  No tenderness.  Distal pulses intact.  No edema.  NEURO:  Nonfocal.  SKIN:  Warm and dry.  MUSCULOSKELETAL:  No muscular weakness.   IMPRESSION:  1. Hypertension, currently well controlled.  We will write a letter to      insurance company to try to maintain her Benicar.  Continue low-      salt diet.  2. Ascending aortic aneurysm.  Follow up  Dr. Edwyna Shell.  CT has been      stable.  Consider changing over to MRI or MRA to save radiation      dose.  3. History of carcinoid with pulmonary nodules.  Follow up with Dr.      Arbutus Ped.  Lesions appears stable by CT.  4. Hyperlipidemia.  Continue Lipitor.  Lipid and liver profile in 6      months.   Overall, I think [name]'s cardiac status is stable.  She does have mild AS  and mild AI.  She will need a followup echo in years.  There is no need  for SBE prophylaxis.     Noralyn Pick. Eden Emms, MD, [facility]  Electronically Signed    PCN/MedQ  DD: 07/10/2008  DT: 07/11/2008  Job #: [id number]

## 2011-03-09 NOTE — Assessment & Plan Note (Signed)
[facility]                            CARDIOLOGY OFFICE NOTE   NAME:[name], [name]                        MRN:          [id number]  DATE:01/31/2008                            DOB:          07-14-1932    Ms. [name] returns today for follow up.  She does not speak a lot of  [place].   She has hypertension and aortic insufficiency.  She complains of some  chronic fatigue.   She has not any significant syncope, PND, orthopnea, and she has mild  exertional dyspnea.  There has been no cough or fever.   RISK FACTORS FOR CORONARY DISEASE:  Diabetes, hypercholesterolemia,  hypertension and family history.   The patient has no documented coronary disease.  She has had a normal  stress test I believe 2 years ago.   Her last echo was done last year and showed moderate aortic  insufficiency with good LV function.   The patient's mean gradient across the valve was only 9 with a peak  gradient of 18.   REVIEW OF SYSTEMS:  Otherwise negative.   MEDICATIONS:  1. Potassium 10 a day.  2. Omeprazole 40 a day.  3. Indapamide 2.5 a day.  4. Norvasc 10 a day.  5. Benicar 40 a day.  6. Vitamins.  7. Coenzyme Q.  8. Actos 45 a day.  9. Glyburide 2 mg a day.  10.Lipitor 10 a day.  11.Advair 250/50 p.r.n.  12.Lasix.   PHYSICAL EXAMINATION:  GENERAL:  An [place] European female in no  distress.  She has difficulty speaking [place].  VITAL SIGNS:  Weight is 182, blood pressure is 140/80, pulse 70 and  regular, afebrile.  HEENT:  Unremarkable.  Carotids are normal without bruit.  No  lymphadenopathy, thyromegaly or JVP elevation.  Carotids are not  bisferans.  LUNGS:  Clear with diaphragmatic motion.  No wheezing.  HEART:  S1-S2.  There is an aortic insufficiency murmur.  PMI is normal.  ABDOMEN:  Benign.  Bowel sounds positive.  No AAA, no tenderness, no  hepatosplenomegaly, hepatojugular reflux.  EXTREMITIES:  Distal pulses intact.  No edema.  NEUROLOGIC:  Nonfocal.  SKIN:  Warm and dry.  No muscular weakness.   IMPRESSION:  1. Moderate aortic insufficiency.  Pulse pressure not wide, not severe      on exam.  A follow-up 2-D echocardiogram.  2. Hypertension, currently well controlled.  Continue low-salt and      multiple medications.  3. Diabetes.  I would like to get the patient off Actos.  She will see      Dr. Thurmond Butts and see.  I think this makes her more prone to volume      overload.  She is already on indapamide and p.r.n. Lasix.  Maybe it      would be possible to substitute Glucophage.  4. Hyperlipidemia with multiple coronary risk factors.  Continue      Lipitor.  Liver and lipid profile in 6 months.  5. Lower extremity edema.  Continue indapamide and p.r.n. Lasix.      Overall, I think  the patient's heart is doing well.  So long as her      echo does not show progression of her AR, I will see her back in a      year.     Noralyn Pick. Eden Emms, MD, [facility]  Electronically Signed    PCN/MedQ  DD: 01/31/2008  DT: 01/31/2008  Job #: [id number]   cc:   Erlinda Hong, MD

## 2011-03-09 NOTE — Letter (Signed)
June 17, 2009   Erlinda Hong, MD  [address] [place]  [place], [place]  [address]   Re:  ANALA, WHISENANT                 DOB:  12/10/1931   Dear Dr. Thurmond Butts:   I saw the patient today in followup as far as her multiple pulmonary  nodules and they all appeared to be stable with the largest being 12 x  12 cm in the medial right lower lobe.  She has history with multiple  carcinoid syndrome, but would not recommend that she have any further  surgery unless once of these starts increasing in size.  She still has a  chronic cough and intermittent bronchitis and her ascending aorta is  4.68 cm.  I think we will just continue to follow her, and I will see  her back again in 6 months with another chest x-ray.  Her blood pressure  is 138/80, pulse 70, respirations 18, sats were 94%.   Sincerely,   Ines Bloomer, M.D.  Electronically Signed   DPB/MEDQ  D:  06/17/2009  T:  06/18/2009  Job:  [id number]   cc:   Georgina Quint. Plotnikov, MD

## 2011-03-09 NOTE — Assessment & Plan Note (Signed)
[name]                            CARDIOLOGY OFFICE NOTE   NAME:Schurman, NESSIE NONG                        MRN:          [id number]  DATE:01/07/2009                            DOB:          11/04/1931    [name] is seen today in followup.  She does not speak much [place].  Her  [name] does most of the interpreting.  Her [name] is also difficult  to understand and somewhat demanding.  As far as I can tell, she has  been stable.  She does not have coronary artery disease that we know of.  She has had significant hypertension.  She has a lot of insistence on  staying on Benicar.  Apparently her insurance company now will pay for  it again.  She also has fairly insistence on taking it twice a day.  She  takes potassium, indapamide, and Benicar as well as amlodipine for her  blood pressure, they had been stable.  She has metastatic carcinoid  tumor.  She gets CAT scans about every 6 months.  We tend to follow her  aortic aneurysm this way as well.  She saw Dr. Arbutus Ped in January and at  that time she had multiple pulmonary mets and her aortic root measured  about 4.9 cm.   She has had previous surgery for carcinoid tumor by Dr. Edwyna Shell.   Currently, she is doing fine and her blood pressure has been under good  control.  Her [name] wants her to have nifedipine at night in case her  systolics are high.  [place] had another name for the nifedipine in [place] language.  The  patient is ambulatory.  She was inquiring about pulmonary rehab at [facility] and I told her that it would be up to Dr. Delford Field or Dr. Edwyna Shell or  her medical doctor to arrange.   From my standpoint as long as her blood pressures stays well controlled,  there is no reason why she could not.  She has had a bit of a cough and  URI recently.  There has been no sputum or fever.  There has been no hot  flashes, palpitations, chest pain,  PND, or orthopnea.   REVIEW OF SYSTEMS:  Otherwise negative.   MEDICATIONS:  1. Potassium 20 a day.  2. Indapamide 2.5 a day.  3. Amlodipine 10 a day.  4. Benicar 40 b.i.d.  5. We will give her nifedipine XL 30 mg to take as needed at night for      systolics above 150.   PHYSICAL EXAMINATION:  GENERAL:  A [place] female in no distress.  VITAL SIGNS:  Blood pressure is 120/68, pulse 70 and regular,  respiratory 14, and afebrile.  HEENT:  Unremarkable.  NECK:  Carotids are normal without bruit.  No lymphadenopathy,  thyromegaly, or JVP elevation.  LUNGS:  Clear with good diaphragmatic motion.  No wheezing.  CARDIAC:  S1 and S2 with soft systolic murmur.  PMI normal.  ABDOMEN:  Benign.  Bowel sounds positive.  No AAA,  no tenderness, no  hepatosplenomegaly or hepatojugular reflux.  EXTREMITIES:  Distal pulses are intact.  No edema.  NEUROLOGIC:  Nonfocal.  SKIN:  Warm and dry.  MUSCULOSKELETAL:  No muscular weakness.   IMPRESSION:  1. Hypertension, currently well controlled, p.r.n. nifedipine at      night, low-sodium diet.  2. Thoracic aortic aneurysm.  Followup CT scan when she gets followup      for her Pulmonary Medicine.  3. Carcinoid tumor.  Follow up with Dr. Edwyna Shell.  Currently not having      systemic signs.  Her pulmonary mets stable per Dr. Arbutus Ped.   Overall, I think [name] is stable.  I will see her back in 6 months.     Noralyn Pick. Eden Emms, MD, [facility]  Electronically Signed    PCN/MedQ  DD: 01/07/2009  DT: 01/08/2009  Job #: [id number]

## 2011-03-09 NOTE — Letter (Signed)
January 13, 2010     RE:  [name], [name]  MRN:  [id number]  /  DOB:  Jun 29, 1932   To Whom it May Concern:   [name] is a 77 year old patient we have followed for quite a long  time for hypertension and aortic insufficiency.  We have tried her on a  slew of medications.  The only thing that has worked in regard to her  blood pressure is taking Benicar twice a day even though Benicar is  usually a once-a-day drug.  Lorynn tends to have a spike in her blood  pressure in the early afternoon and it is critical that she take her  Benicar twice a day and we have tried her on a single dose of Benicar  early in the morning and it makes her feel sick and does not take care  of the spike later in the afternoon.  She is intolerant to a lot of  other medicines.  She is particularly fixed on taking her Benicar twice  a day since it has worked for her for so long.  She also has aortic  insufficiency and afterload reduction is important since this is the  only method of therapy that has worked for her.  It is imperative that  she take her Benicar twice a day.  I see no reason why this cannot be  covered by insurance.   If you have any questions, do not hesitate to contact me.    Sincerely,      Noralyn Pick. Eden Emms, MD, [facility]    PCN/MedQ  DD: 01/13/2010  DT: 01/13/2010  Job #: [id number]

## 2011-03-09 NOTE — Letter (Signed)
May 03, 2007   Georgina Quint. Plotnikov, MD  [address]  [place], [place] [address]   Re:  [name], [name]                 DOB:  25-Jul-1932   Dear Dr. Posey Rea:   I saw [name] back and repeated her CT scans. The four largest  nodules that we have been watching are all the same. There is no really  major change since we think these are multiple small carcinoids and  there has been no change. We will just continue to follow her. I will  see her back again in six months with another CT scan. Her blood  pressure was 150/91, pulse 72, respirations 18, saturations were 94%.   Ines Bloomer, M.D.  Electronically Signed   DPB/MEDQ  D:  05/03/2007  T:  05/04/2007  Job:  [id number]

## 2011-03-09 NOTE — Letter (Signed)
July 10, 2008    [facility]   RE:  [name], [name]  MRN:  [id number]  /  DOB:  [birth year]   To whom it may concern:   [name] has been a longstanding the patient of [facility].  She is 77 years old.  She has significant hypertension.  She has been on  Benicar for a long time.  Prior to this, we tried a myriad of ARBs and  ACE inhibitors, and none of them controlled her blood pressure.  It is  extremely important for her to have her blood pressure control.  She has  an ascending aortic aneurysm that measures about 4.5 cm.  It is  imperative that the patient remain on 40 mg of Benicar and not have this  substituted for generic or another ARB.   If you have any questions, do not hesitate to contact me.    Sincerely,      Noralyn Pick. Eden Emms, MD, [facility]  Electronically Signed    PCN/MedQ  DD: 07/10/2008  DT: 07/11/2008  Job #: [id number]

## 2011-03-09 NOTE — Letter (Signed)
September 30, 2010   Erlinda Hong, MD  [address] [place]  [place], [place]  [address]   Re:  [name], [name]                 DOB:  10-03-1932   Dear Dr. Thurmond Butts:   I saw the patient back today and her chest x-ray is stable.  She still  has had a cough and a recent URI, but is stable.  See no changes there,  lung nodules, or no collapse of her lung.  Her lungs are clear  bilaterally.  I will see her again in 3 months with a CT scan.   Sincerely,   Ines Bloomer, M.D.  Electronically Signed   DPB/MEDQ  D:  09/30/2010  T:  10/01/2010  Job:  [id number]

## 2011-03-09 NOTE — Letter (Signed)
November 21, 2007   Georgina Quint. Plotnikov, MD  [address]  [place], [place] [address]   Re:  YANEISY, WENZ                   DOB:  1932/03/16   Dear Dr. Posey Rea:   Saw the patient in the office today and repeated her CT scan and all the  nodules are the same.  There is no change and we think this is all  multiple carcinoid.  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  Until they  get bigger, will just continue to follow her.  See her again in another  6 months with another CT scan.  her blood pressure is 140/86, pulse 89,  respirations 18, saturation 96%.   Ines Bloomer, M.D.  Electronically Signed   DPB/MEDQ  D:  11/21/2007  T:  11/22/2007  Job:  [id number]

## 2011-03-09 NOTE — Letter (Signed)
June 03, 2010   Lajuana Matte, MD  [phone]  [place], [place] [address]   Re:  [name], [name]                 DOB:  [birth year]   Dear Arbutus Ped,   I saw the patient back today.  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  She also has  multiple pulmonary nodules which most of those have remained stable on  CT scan.  They go along with multiple carcinoid syndrome.  When we did  her surveillance bronchoscopy in February, looks like it was healing  well although there is question of some neuroendocrine lesions on her  washings.  Because of this, even given a stable CT, I think she needs to  have a repeat bronchoscopy and I plan to do this on July 02, 2010,  at [facility].  Her blood pressure is 122/71, pulse 73, respirations  18, saturations were 91%.   I appreciate the opportunity of seeing the patient.   Ines Bloomer, M.D.  Electronically Signed   DPB/MEDQ  D:  06/03/2010  T:  06/04/2010  Job:  [id number]   cc:   Rosanne Ashing, MD

## 2011-03-09 NOTE — Letter (Signed)
October 15, 2009   Erlinda Hong, MD  [address] [place]  [place], [place]  [address]   Re:  [name], [name]                 DOB:  11/22/1931   Dear Dr. Thurmond Butts:   I saw the patient back today after we did an endobronchial excision of a  low-grade neuroendocrine tumor or carcinoid.  This is in the right upper  lobe.  We excised this endobronchially and then lasered at the base.  She is doing well and her cough is improved.  I will plan to see her  back again in 6 weeks and repeat her bronchoscopy to be sure everything  is healed.  Her blood pressure was 136/66, pulse 85, respirations 18,  and sats were 94%.   Sincerely,   Ines Bloomer, M.D.  Electronically Signed   DPB/MEDQ  D:  10/15/2009  T:  10/15/2009  Job:  [id number]

## 2011-03-09 NOTE — Letter (Signed)
December 25, 2008   Aleksei V. Plotnikov, MD  [address] [place], [place] [address]   Re:  [name], [name]                 DOB:  [birth year]   Dear Dr. Posey Rea:   I saw the patient back today.  We repeat her CT scan, although she has  new nodules, none of them have changed.  She still has a cough and we  told her that Tussionex was not paid for by Medicare, so she is going to  try Robitussin-DM.  Her blood pressure is 140/80, pulse 72, respirations  18, and sats are 93%.  I will see her back again in 6 months with  another CT scan.   Ines Bloomer, M.D.  Electronically Signed   DPB/MEDQ  D:  12/25/2008  T:  12/25/2008  Job:  [id number]

## 2011-03-09 NOTE — Letter (Signed)
November 26, 2009   Erlinda Hong, MD  [address] [place]  [place], [place]  [address]   Re:  [name], [name]                 DOB:  [birth year]   Dear Dr. Thurmond Butts:   I saw the patient back today.  Her cough is definitely better.  As you  know, we excised a right upper lobe carcinoid tumor with the laser on  October 05, 2009.  Her cough has been definitely improved.  She has  been stable since then.  I plan to repeat her bronchoscopy on December 09, 2009, to be sure that there is no evidence of tumor and this has  healed properly.  Her blood pressure was 120/80, pulse 82, respirations  18, and sats were 91%.   Sincerely,   Ines Bloomer, M.D.  Electronically Signed   DPB/MEDQ  D:  11/26/2009  T:  11/27/2009  Job:  [id number]   cc:   Lajuana Matte, MD

## 2011-03-09 NOTE — Letter (Signed)
June 26, 2008   Georgina Quint. Plotnikov, MD  [address]  [place]  [place] [address]   Re:  [name], [name]                 DOB:  [birth year]   Dear Macarthur Critchley,   I saw the patient.  She is seeing Dr. Arbutus Ped and the 3 nodules that we  have been following were all unchanged.  As you know, she has multiple  carcinoid syndrome.  We saw no other changes.  She is now 3 years since  her surgery.  We will continue to follow these nodules.  Should they get  bigger, then we will consider proceeding with further surgery.  Her  ascending aortic aneurysm was also unchanged 4.8 cm.  I will see her  back again in 6 months and repeat her CT scan.  Her blood pressure is  129/81, pulse 96, respirations 80, sats were 93%.   Ines Bloomer, M.D.  Electronically Signed   DPB/MEDQ  D:  06/26/2008  T:  06/27/2008  Job:  [id number]

## 2011-03-09 NOTE — Letter (Signed)
September 02, 2009   Erlinda Hong, MD  [address] [place]  [place], [place]  [address]   Re:  EVAROSE, ALTLAND                 DOB:  [birth year]   Dear Dr. Thurmond Butts:   As you know, we have followed the patient for a longtime with multiple  carcinoid syndrome.  She has multiple nodules in her lungs and none of  which of these gotten larger on CT scan.  She is complaining of a  chronic cough.  This has gotten worse over the last 2-3 years.  She has  had no fever, chills, or excessive sputum.  She was seen in consultation  by you and recommended she have a  bronchoscopy .  I will go ahead and  schedule her for a bronchoscopy at [facility] and will inform you the  findings.  I appreciate the opportunity of seeing the patient again.   Sincerely,   Ines Bloomer, M.D.  Electronically Signed   DPB/MEDQ  D:  09/02/2009  T:  09/03/2009  Job:  [id number]   cc:   Rosanne Ashing, MD  Lajuana Matte, MD

## 2011-03-09 NOTE — Assessment & Plan Note (Signed)
[facility]                            CARDIOLOGY OFFICE NOTE   NAME:[name], [name]                        MRN:          [id number]  DATE:08/16/2007                            DOB:          10-Mar-1932    [name] is a patient I have seen for edema, hypertension, aortic  insufficiency, and previous nonischemic cardiomyopathy.   She is currently stable and doing well.  Her blood pressure seems to be  under better control.  We have to be somewhat careful.  She had Actos 45  mg a day added to her regimen.  I explained to her that this can cause  increasing lower extremity edema.  She is on indapamide and is currently  stable, but she will be leery of this.  She does not speak good English,  but I explained this in detail to her daughter.   From a heart perspective she is doing fairly well, and she has not had  any excessive PND or orthopnea.  There has been trace lower extremity  edema.  There has been no chest pain or palpitations.  Her last  echocardiogram was actually somewhat surprising.  In March of 2008 she  had normal left ventricular function with very mild aortic stenosis and  mild aortic insufficiency.   The patient does note need subacute bacterial endocarditis prophylaxis.   Her review of systems, otherwise, negative.   CURRENT MEDICATIONS:  1. Glimepiride 6 mg a day.  2. Potassium 10 a day.  3. Omeprazole 40 a day.  4. Indapamide 2.5 a day.  5. Norvasc 10 a day.  6. Benicar 40 a day.  7. Multiple vitamins.  8. Actos 45 a day.  9. Lipitor 10 a day.  10.Advair.   PHYSICAL EXAMINATION:  Remarkable for an elderly white female who speaks  poor [place].  Her weight is stable at 180, blood pressure is 140/80, pulse is 80 and  regular.  Respiratory rate is 14.  HEENT:  Unremarkable.  LUNGS:  Clear.  Good diaphragmatic motion, no wheezing.  NECK:  Supple, there is no JVP elevation.  No bisferiens pulse, no  bruits.  There is an S1,  S2, and currently I cannot hear a significant diastolic  murmur.  There is a slight systolic ejection murmur of aortic sclerosis.  PMI is not palpable.  ABDOMEN:  Protuberant.  Bowel sounds positive.  No tenderness, no  hepatosplenomegaly or hepatojugular reflux, no bruit and no aneurysm.  Femorals are +3 bilaterally.  PTs are +3.  There are no pistol-shot  pulses.  NEURO:  Nonfocal.  There is no muscular weakness.  SKIN:  Warm and dry.   Her baseline EKG shows voltage criteria for left ventricular hypertrophy  with sinus rhythm and occasional premature atrial contractions.   IMPRESSION:  1. Stable aortic valve disease, moderate aortic insufficiency.      Continue current dose of Benicar in regards to afterload reduction.      Followup echo in a year.  2. Lower extremity edema, stable, continue indapamide.  We will have      to  watch this closely in regards to her recent Actos.  It may be in      the future that she needs to be on a stronger diuretic like Lasix      20 a day.  Continue potassium supplementation.  3. Hypertension.  Currently well controlled.  Continue current      medications.  Again, in the future, if she has increasing edema,      her Actos and Norvasc will have to be reassessed.  4. History of reflux.  Continue omeprazole 40 mg a day.  5. The patient will need a followup lipid and liver profile in her      primary exam this year.  I believe she is now seeing [facility] as her      primary care doctor.  I will let him take care of this since she      has no documented coronary artery disease.   Overall, the patient is doing well and I will see her back in 6 months.     Noralyn Pick. Eden Emms, MD, [facility]  Electronically Signed    PCN/MedQ  DD: 08/16/2007  DT: 08/17/2007  Job #: [id number]

## 2011-03-09 NOTE — Letter (Signed)
July 08, 2010   Lajuana Matte, MD  [phone]  [place], [place] [address]   Re:  SHERONDA, PARRAN                 DOB:  [birth year]   Dear Shirline Frees:   I saw the patient back in the office today after bronchoscopy and there  is no evidence of recurrent endobronchial neuroendocrine tumor.  All the  biopsies were negative.  Plan to get a chest x-ray on her in 3 months  and then see her back and get a followup CT scan in 6 months.  She is  doing well overall.  Her blood pressure was 138/83, pulse 76,  respirations 20, sats were 93%.   Ines Bloomer, M.D.  Electronically Signed   DPB/MEDQ  D:  07/08/2010  T:  07/09/2010  Job:  [id number]

## 2011-03-09 NOTE — Assessment & Plan Note (Signed)
OFFICE VISIT   [name], [name]  DOB:  [birth year]                                        December 12, 2009  CHART #:  [id number]   The patient comes today after her bronchoscopy.  Her biopsies were  negative; however, the bronchial washings did show some questionable  irregular cells but nothing that we could really diagnose as possible  neuroendocrine.  Because of this, we will discontinue to follow her, and  I will see her back again in 3 months with another CT scan.  At that  time, I probably have to repeat her bronchoscopy.  Her blood pressure is  136/68, pulse 95, respirations 16, sats were 95%.   Ines Bloomer, M.D.  Electronically Signed   DPB/MEDQ  D:  12/12/2009  T:  12/13/2009  Job:  [id number]

## 2011-03-09 NOTE — Letter (Signed)
Mar 10, 2010   Rosanne Ashing, MD  [facility]  [facility] [address]rd floor [address]  [place], [place]  [address]   Re:  KYNLI, CHOU                 DOB:  April 04, 1932   Dear Dr. Shary Key:   I saw the patient today.  Her blood pressure was 137/83, pulse 90,  respirations 18, and sats were 91%.  I reviewed the CT scan done at [facility] and compared with our scans and it appears to be stable.  The  right upper lobe appears to be open where we removed an endobronchial  carcinoid and she has had minimal problems as her cough is improved.  We  also looked at her multiple pulmonary nodules, as you know, she had  multiple carcinoid syndrome and I see no major change compared to her  previous studies.  I will continue to follow her and she will get  another CT scan in August and see Dr. Arbutus Ped at that time.   Ines Bloomer, M.D.  Electronically Signed   DPB/MEDQ  D:  03/10/2010  T:  03/11/2010  Job:  [id number]   cc:   Lajuana Matte, MD  Harvest Dark, MD

## 2011-03-12 ENCOUNTER — Ambulatory Visit: Payer: Medicare Other | Admitting: Physical Therapy

## 2011-03-12 NOTE — Consult Note (Signed)
[name], [name] NO.:  [id number]   MEDICAL RECORD NO.:  [id number]          PATIENT TYPE:  INP   LOCATION:  3310                         FACILITY:  [facility]   PHYSICIAN:  Lajuana Matte, MD  DATE OF BIRTH:  December 30, 1931   DATE OF CONSULTATION:  07/19/2005  DATE OF DISCHARGE:  07/20/2005                                   CONSULTATION   REQUESTING PHYSICIAN:  Ines Bloomer, M.D.   HISTORY:  We were called to see this patient by Dr. Edwyna Shell in consult.  She  is a 77 year old [place] immigrant who does not speak [place].  Her daughter  is in the room with her today, who speaks limited broken [place].  What I am  able to gather from the daughter is that [name] has had a cough/bronchitis  intermittently for the past 25 years.  When immigrating to the [place], she was initially thought to have had TB and was sent for further  evaluation.  The patient denies any smoking or tuberculosis exposure.  The  cough is described as primarily nonproductive, although it is occasionally  productive of white sputum.   PAST MEDICAL HISTORY:  Significant for hypertension and diabetes.   FAMILY HISTORY:  Negative for cancer, although it is positive for  hypertension.   REVIEW OF SYSTEMS:  Significant for the cough and dyspnea, otherwise  noncontributory.   PHYSICAL EXAMINATION:  GENERAL:  This is an obese Caucasian female, non-  English-speaking, in no acute distress.  HEENT:  Atraumatic and normocephalic.  Oropharynx is clear.  NECK:  Supple without lymphadenopathy.  CHEST:  Clear to auscultation and percussion.  CARDIOVASCULAR:  Normal S1 and S2 with a regular rate and rhythm without  appreciable murmurs, rubs or gallops.  ABDOMEN:  Soft, nontender, nondistended without masses.  EXTREMITIES:  Without edema.   The patient has been diagnosed with pulmonary carcinoid tumor by the  pathology on the specimen biopsies.  She is status post surgical resection  with  multiple nodules in the left lung.  The patient was seen and evaluated  by Dr. Arbutus Ped, who states that this particular type of tumor was not  responsive to radiotherapy, and surgical resection is  the best option.  Chemotherapy for typical carcinoid is not recommended with  the response rate only being about 7%.  She will be seen for followup in 1-2  months at the [facility] by Dr. Arbutus Ped for further discussion  of this illness.     ______________________________  Tiana Loft, PA.      Lajuana Matte, MD  Electronically Signed    AJ/MEDQ  D:  07/20/2005  T:  07/20/2005  Job:  [id number]

## 2011-03-12 NOTE — Op Note (Signed)
NAME:  [name], [name]                           ACCOUNT NO.:  [id number]   MEDICAL RECORD NO.:  [id number]                   PATIENT TYPE:  OIB   LOCATION:  2855                                 FACILITY:  [facility]   PHYSICIAN:  Beulah Gandy. Ashley Royalty, M.D.              DATE OF BIRTH:  Mar 20, 1932   DATE OF PROCEDURE:  02/04/2004  DATE OF DISCHARGE:                                 OPERATIVE REPORT   ADMISSION DIAGNOSES:  Vitreous hemorrhage, right eye; branch retinal vein  occlusion, right eye; hypertension, right eye; capsular fibrosis, right eye.   PROCEDURE:  Pars plana vitrectomy, right eye; posterior capsulectomy, right  eye; endophotocoagulation, right eye.   SURGEON:  Dr. Alan Mulder.   ASSISTANT:  Merian Capron.   ANESTHESIA:  General.   DETAILS:  Usual prep and drape.  Peritomies at 8, 10 and 2 o'clock.  The 4  mm angled infusion port was anchored into place at 8 o'clock.  The lighted  pick and the cutter were placed at 10 and 2 o'clock, respectively.  The pars  plana vitrectomy was begun just behind the crystalline lens.  The vitreous  cutter was used to incise the capsule and remove it centrally.  When the  nice clear central view was obtained, the vitrectomy was carried posteriorly  where old, white, thick, blood was encountered.  This was encountered and  removed with the vitreous cutter.  The vitrectomy was carried down to the  macular surface where some laser scars were seen and a branch retinal vein  occlusion pattern was seen.  Ghost vessels were noted and neovascularization  was seen.  Once all the vitreous was removed from the central portion of the  eye, the vitrectomy was carried into the far peripheral vitreous space.  The  biome was used and scleral depression was used to remove all vitreous from  the base of the vitreous cavity.  Once the base was totally cleaned, the  endo-laser was positioned in the eye.  565 burns were placed around the  retinal periphery.  The  power at 400 milliwatts, 1000 microns each and 0.1  seconds each.  The instruments were removed from the eye and 9-0 nylon was  used to close the sclerotomy sites.  They were tested and found to be tight.  The conjunctiva was closed with wet-field cautery.  Polymixin and gentamycin  were irrigated into tenon space.  Atropine solution was applied.  Decadron  10 mg was injected into the lower subconjunctival space.  The closing  tension was 10 with a Barraquer tonometer.  Polysporin, a patch and shield  were placed.  The patient was awakened and taken to recovery in satisfactory  condition.   COMPLICATIONS:  None.   DURATION:  One hour.  Beulah Gandy. Ashley Royalty, M.D.    JDM/MEDQ  D:  02/04/2004  T:  02/05/2004  Job:  [id number]

## 2011-03-12 NOTE — Discharge Summary (Signed)
NAMEHARMAN, [name] NO.:  [id number]   MEDICAL RECORD NO.:  [id number]          PATIENT TYPE:  INP   LOCATION:  3310                         FACILITY:  [facility]   PHYSICIAN:  Ines Bloomer, M.D. DATE OF BIRTH:  Jan 27, 1932   DATE OF ADMISSION:  07/15/2005  DATE OF DISCHARGE:  07/20/2005                                 DISCHARGE SUMMARY   HISTORY OF PRESENT ILLNESS:  The patient is a 77 year old female who is from  [place] and was recently referred to Dr. Edwyna Shell for thoracic surgical  opinion.  She has had a chronic cough for some 25 years and an abnormal  chest x-ray raised the possibility of tuberculosis.  She had no previous  known tubercular exposure but had multiple pulmonary nodules.  She was seen  by Dr. Shan Levans who obtained a PET scan and this showed the largest  nodules on both sides to have an SUV of 2 to 2.4 with  many of the nodules  not showing increased metabolic activity.  She was referred to Dr. Edwyna Shell  for biopsy.   MEDICATIONS PRIOR TO ADMISSION:  1.  Amersol 40 mg daily.  2.  Amaryl 4 mg daily.  3.  Aspirin 81 mg daily.  4.  Norpace 20 mg daily.  5.  Benicar 20 mg daily.  6.  Indapamide 2.5 mg daily.   PAST MEDICAL HISTORY:  1.  Diabetes.  2.  Hypertension.   PAST SURGICAL HISTORY:  None.   ALLERGIES:  No known drug allergies.   Family history, social history, review of systems and physical examination  please see the history and physical done at the time of admission.   HOSPITAL COURSE:  Patient was admitted to the hospital, taken to the  operating room on July 15, 2005, where she underwent right thoracotomy  with wedge resection biopsies.  Patient tolerated procedure well and was  taken to the post anesthesia care unit in stable condition.   POSTOPERATIVE HOSPITAL COURSE:  Patient has done well.  She has responded  well in regard to her increasing activity in a routine manner.  All routine  lines, monitors, drainage  devices were discontinued in standard fashion.  Pathology has revealed a carcinoid tumor.  Oncology consultation has been  obtained and final treatment plan is still pending regarding this.  Incision  is healing well without evidence of infection.  She is tolerating diet.  Oxygen has been weaned and she maintains good saturations on room air.  Her  overall status is felt to be tentatively stable for discharge on July 20, 2005, pending morning round reevaluation.   DISCHARGE MEDICATIONS:  1.  Norvasc 5 mg daily.  2.  Amaryl 4 mg daily.  3.  Benicar 40 mg twice daily.  4.  Prilosec 40 mg daily.  5.  Indapamide 2.5 mg daily.  6.  Aspirin 81 mg daily.  7.  Potassium daily.  8.  For pain, Tylox one or two every six hours as needed.   DISCHARGE INSTRUCTIONS:  Patient received written instructions regarding  medications, activity, diet, wound  care and follow-up.   FOLLOW UP:  Ines Bloomer, M.D., Wednesday, July 28, 2005, at 11:50  with a chest x-ray from [facility].  She will also follow up with  Dr. Delford Field and Dr. Arbutus Ped as they request.   FINAL DIAGNOSIS:  Carcinoid tumor now status post biopsy.  Other diagnoses  as previously listed per the history.      Rowe Clack, P.A.-C.    ______________________________  Ines Bloomer, M.D.    Sherryll Burger  D:  07/19/2005  T:  07/20/2005  Job:  [id number]   cc:   Shan Levans, M.D. [facility]  [address]  [place]  [place] [address]   Lajuana Matte, MD  Fax: [phone]

## 2011-03-12 NOTE — H&P (Signed)
NAME:  [name], [name]                 ACCOUNT NO.:  [id number]   MEDICAL RECORD NO.:  [id number]          PATIENT TYPE:  INP   LOCATION:  NA                           FACILITY:  [facility]   PHYSICIAN:  Ines Bloomer, M.D. DATE OF BIRTH:  November 05, 1931   DATE OF ADMISSION:  DATE OF DISCHARGE:                                HISTORY & PHYSICAL   CHIEF COMPLAINT:  Lung nodules.   HISTORY OF PRESENT ILLNESS:  This is a 77 year old female who is from [place]  and does not speak [place].  She was recently seen with an interpreter.  She  recently migrated from [place], is here with her daughter who also does not  speak [place] and migrated from [place].  She has apparently had a chronic  cough for 25 years and abnormal chest x-ray, and the possibility was raised  of tuberculosis with possibility with this for deportation.  She has no  specific history of tubercular exposure, but has multiple pulmonary nodules.  She was seen by Dr. Danise Mina who obtained a PET scan and showed that the  largest nodules on both side had an SUV of 2 to 2.4 and many nodules did not  show up.  She has no chest pain, history of acid reflux, loss of appetite,  dysphagia, no excessive sputum, no hemoptysis.  She is a nonsmoker.   MEDICATIONS:  1.  Amersol 40 mg a day.  2.  Amaryl 4 mg a day.  3.  Aspirin 81 mg q day.  4.  Norpace 20 mg a day.  5.  Benicar 20 mg a day.  6.  __________ 2.5 mg a day.   PAST MEDICAL HISTORY:  1.  Diabetes.  2.  Hypertension.   Has had no operations.   ALLERGIES:  None.   FAMILY HISTORY:  Negative for cancer and positive for hypertension.   SOCIAL HISTORY:  She lives with her daughter and her husband.  Does not  smoke.  Is a [place] immigrant.   REVIEW OF SYSTEMS:  Her weight is stable.  She is 5 feet 1 inch, 170 pounds.  CARDIAC: She has no angina or atrial fibrillation.  PULMONARY: She has some  chest pain, shortness of breath, productive cough, and occasional wheezing.  GI:  She  has been treated for reflux, chronic diarrhea, constipation.  GU:  She has frequent urination. VASCULAR: No TIAs, DVT, or claudication.  NEUROLOGIC:  Denies any dizziness, headaches, blackouts, or seizure.  ORTHOPEDIC: She has arthritis and has had occasional skin rash. PSYCHIATRIC:  No history of psychiatric illnesses.  EYES/ENT: No change in eyesight or  hearing.  HEMATOLOGIC: No problems with anemia.  As mentioned, she is a type  2 diabetic.   PHYSICAL EXAMINATION:  GENERAL:  She is a slightly obese Caucasian female in  no acute distress.  VITAL SIGNS:  Blood pressure 170/82, pulse 80, respirations 18, O2  saturation 92%.  HEAD:  Atraumatic.  EYES: Pupils equal, round, and reactive to light and accommodation.  EARS:  Tympanic membranes are intact.  NOSE: No septal deviation.  MOUTH: Without lesion.  NECK:  Supple.  There is no thyromegaly, no carotid bruits, no  supraclavicular or axillary adenopathy.  CHEST:  Bilateral wheezes.  HEART:  Regular sinus rhythm, no murmurs.  ABDOMEN:  Soft.  Bowel sounds were normal.  EXTREMITIES:  Pulses 2+.  No clubbing or edema.  NEUROLOGIC:  She is oriented x3.  Sensory and motor are intact.  SKIN: Without lesions.   IMPRESSION:  1.  Multiple pulmonary nodules with dyspnea.  2.  Type 2 diabetes.  3.  Hypertension.   PLAN:  Left VATS, lung biopsy of lesions to rule out cancer, rule out fungus  disease, rule out tuberculosis.           ______________________________  Ines Bloomer, M.D.     DPB/MEDQ  D:  07/13/2005  T:  07/13/2005  Job:  [id number]

## 2011-03-12 NOTE — Assessment & Plan Note (Signed)
[facility]                            CARDIOLOGY OFFICE NOTE   NAME:MAIERERenay, Crammer                        MRN:          [id number]  DATE:01/06/2007                            DOB:          Jun 26, 1932    SUBJECTIVE:  Ms. [name] is seen today in followup.  Unfortunately she  did not keep her last appointment for her echocardiogram.  She has  severe left ventricular dysfunction with mild to moderate aortic  insufficiency.  She has been doing well.  She has not had significant  chest pain, PND or orthopnea.   She has been compliant with her medications, as far as I can tell.  She  does not speak good English, and most of the history was taken from her  daughter.   MEDICATIONS:  1. Glyburide 6 mg daily.  2. Potassium 10 daily.  3. Omeprazole 40 mg q.d.  4. Depamide 2.5 mg daily.  5. Norvasc 10 mg daily.  6. Benicar 40 mg b.i.d.  7. Tussionex.  8. Aspirin daily.   DATA:  She has no history of coronary artery disease.  Her last Myoview  in 2005, was nonischemic.  She had an echocardiogram that showed severe  left ventricular dysfunction with mild to moderate AR.  She currently  has not had any significant PND or orthopnea.  Today in the office she  was having short bursts of PSVT.  Her electrocardiogram showed a sinus  rhythm with an occasional PVC and poor R-wave progression.   Apparently there is some question of lung nodules, which apparently  being followed by Dr. Ines Bloomer.  One of the notes from her CT  scan dated October 27, 2006, said stable multiple bilateral lung nodules  with stable fusiform dilatation of the ascending aorta.   PHYSICAL EXAMINATION:  VITAL SIGNS:  Blood pressure 130/70, pulse 80  with occasional irregularities.  NECK:  Carotids are normal.  They are not particularly bounding.  LUNGS:  Clear.  HEART:  There is an S1 and S2, with a faint AI murmur.  ABDOMEN:  Benign.  EXTREMITIES:  Lower extremities with intact  pulses.  No edema.   IMPRESSION/PLAN:  The patient is stable.  Her blood pressure is well-  controlled.  Her volume status appears normal:  She has Lasix p.r.n. to  take if she needs for lower extremity edema.  Again, her history is  extremely difficult to take.  While she was here in the office she  appeared to have some premature supraventricular tachycardia.  This was  asymptomatic.  In the future if she were to have continued problems, I  would probably stop her Norvasc and place her on Toprol 50 mg to 100 mg  daily.  I do not think we need to do this now.  We will have her come  back next week to assess her left ventricular dysfunction and aortic  insufficiency.   Her blood pressure appears well-controlled; however, I suspect that her  aortic insufficiency and aortic root dilatation are secondary to  hypertensive disease.  She will continue her Benicar  at the current dose  for after-load reduction and blood pressure control.   So long as her echocardiogram does not show any change, I will see her  back in six months to one year.  Her dentition are in reasonable shape.  She does not need SBE prophylaxis per recent guidelines.  If she were to  have symptomatic palpitations, a beta blocker could be added, and  Norvasc stopped.     Noralyn Pick. Eden Emms, MD, [facility]  Electronically Signed    PCN/MedQ  DD: 01/06/2007  DT: 01/08/2007  Job #: [id number]

## 2011-03-16 ENCOUNTER — Encounter: Payer: Medicare Other | Admitting: Rehabilitative and Restorative Service Providers"

## 2011-03-16 ENCOUNTER — Ambulatory Visit: Payer: Medicare Other | Admitting: Physical Therapy

## 2011-03-17 ENCOUNTER — Ambulatory Visit: Payer: Medicare Other | Admitting: Physical Therapy

## 2011-03-17 ENCOUNTER — Encounter: Payer: Medicare Other | Admitting: Rehabilitative and Restorative Service Providers"

## 2011-03-19 ENCOUNTER — Encounter: Payer: Medicare Other | Attending: Physical Medicine & Rehabilitation

## 2011-03-19 ENCOUNTER — Ambulatory Visit: Payer: Medicare Other | Admitting: Physical Medicine & Rehabilitation

## 2011-03-19 DIAGNOSIS — M4120 Other idiopathic scoliosis, site unspecified: Secondary | ICD-10-CM | POA: Insufficient documentation

## 2011-03-19 DIAGNOSIS — M47817 Spondylosis without myelopathy or radiculopathy, lumbosacral region: Secondary | ICD-10-CM | POA: Insufficient documentation

## 2011-03-19 NOTE — Assessment & Plan Note (Signed)
REASON FOR VISIT:  Back pain, chronic, poor balance.  HISTORY:  A 77 year old female with history of balance disorder, likely musculoskeletal.  She does have lumbar spondylosis.  She had a fall in her doctor's office sometime in March or April.  She was referred to balance program at [facility].  She is tolerating this well.  She feels like her balance is getting a little bit better.  She also was fitted with a TENS unit which has been very helpful for her back pain.  She has had no new medical problems.  CURRENT MEDICATIONS:  She mainly takes Tylenol, occasionally takes Ultracet one p.o. b.i.d., sometimes uses Voltaren gel as well.  PHYSICAL EXAMINATION:  MUSCULOSKELETAL:  Back has no tenderness to palpation.  Lumbar spine range of motion is full for forward flexion and 50% extension.  Her gait shows no evidence of toe drag or knee instability.  Her Romberg is bit wobbly, but no falls.  She is able to toe walk and heel walk short distances, not able to do tandem gait.  IMPRESSION: 1. Balance disorder, likely musculoskeletal improving. 2. Lumbar spondylosis, symptomatically improved, TENS unit seems to be     helping with her overall pain, low medication usage pattern.  Discussed with patient and her daughter.     Erick Colace, M.D. Electronically Signed    AEK/MedQ D:  03/19/2011 11:56:56  T:  03/19/2011 23:52:29  Job #:  [id number]  cc:   Ines Bloomer, M.D. [address] [place], [place] [address]  Dr. Erlinda Hong

## 2011-03-24 ENCOUNTER — Encounter: Payer: Medicare Other | Admitting: Physical Therapy

## 2011-03-26 ENCOUNTER — Ambulatory Visit: Payer: Medicare Other | Attending: Physical Medicine & Rehabilitation | Admitting: Physical Therapy

## 2011-03-26 DIAGNOSIS — R5381 Other malaise: Secondary | ICD-10-CM | POA: Insufficient documentation

## 2011-03-26 DIAGNOSIS — R293 Abnormal posture: Secondary | ICD-10-CM | POA: Insufficient documentation

## 2011-03-26 DIAGNOSIS — IMO0001 Reserved for inherently not codable concepts without codable children: Secondary | ICD-10-CM | POA: Insufficient documentation

## 2011-03-26 DIAGNOSIS — M6281 Muscle weakness (generalized): Secondary | ICD-10-CM | POA: Insufficient documentation

## 2011-03-30 ENCOUNTER — Encounter: Payer: Medicare Other | Admitting: Physical Therapy

## 2011-04-01 ENCOUNTER — Ambulatory Visit: Payer: Medicare Other | Admitting: Physical Therapy

## 2011-04-15 ENCOUNTER — Ambulatory Visit: Payer: Medicare Other | Admitting: Physical Therapy

## 2011-04-16 ENCOUNTER — Ambulatory Visit: Payer: Medicare Other | Admitting: Physical Therapy

## 2011-04-20 ENCOUNTER — Encounter: Payer: Medicare Other | Admitting: Physical Therapy

## 2011-04-22 ENCOUNTER — Other Ambulatory Visit: Payer: Self-pay | Admitting: *Deleted

## 2011-04-22 ENCOUNTER — Encounter: Payer: Medicare Other | Admitting: Physical Therapy

## 2011-04-22 MED ORDER — OLMESARTAN MEDOXOMIL 40 MG PO TABS: 40.0000 mg | ORAL_TABLET | Freq: Every day | ORAL | Status: DC

## 2011-05-03 ENCOUNTER — Telehealth: Payer: Self-pay | Admitting: Cardiovascular Disease

## 2011-05-03 ENCOUNTER — Other Ambulatory Visit: Payer: Self-pay | Admitting: *Deleted

## 2011-05-03 MED ORDER — OLMESARTAN MEDOXOMIL 40 MG PO TABS: 40.0000 mg | ORAL_TABLET | Freq: Two times a day (BID) | ORAL | Status: DC

## 2011-05-03 MED ORDER — OLMESARTAN MEDOXOMIL 40 MG PO TABS: 40.0000 mg | ORAL_TABLET | Freq: Every day | ORAL | Status: DC

## 2011-05-03 NOTE — Telephone Encounter (Signed)
Pt needs Benicar BID called into [facility] on [address] today asap

## 2011-05-14 ENCOUNTER — Encounter: Payer: Self-pay | Admitting: Cardiovascular Disease

## 2011-05-21 ENCOUNTER — Ambulatory Visit: Payer: Medicare Other | Admitting: Physical Medicine & Rehabilitation

## 2011-06-07 ENCOUNTER — Telehealth: Payer: Self-pay | Admitting: *Deleted

## 2011-06-07 NOTE — Telephone Encounter (Signed)
PER PT, DOES NOT WANT TO SCHEDULE ANY MORE COLONOSCOPIES

## 2011-06-17 ENCOUNTER — Ambulatory Visit: Payer: Medicare Other | Admitting: Cardiovascular Disease

## 2011-06-18 ENCOUNTER — Encounter: Payer: Medicare Other | Attending: Physical Medicine & Rehabilitation

## 2011-06-18 ENCOUNTER — Ambulatory Visit: Payer: Medicare Other | Admitting: Physical Medicine & Rehabilitation

## 2011-06-18 DIAGNOSIS — M47817 Spondylosis without myelopathy or radiculopathy, lumbosacral region: Secondary | ICD-10-CM | POA: Insufficient documentation

## 2011-06-18 DIAGNOSIS — M25559 Pain in unspecified hip: Secondary | ICD-10-CM | POA: Insufficient documentation

## 2011-06-18 DIAGNOSIS — M549 Dorsalgia, unspecified: Secondary | ICD-10-CM | POA: Insufficient documentation

## 2011-06-18 NOTE — Assessment & Plan Note (Signed)
The patient has been effective for her pain.  Her pain is down to a 2/10 in her back area.  She also has some right hip pain.  No falls.  Sleep has been fair.  Pain is worse with sitting and standing.  Improves with medication and TENS.  She can walk 20 minutes at a time.  She can climb steps.  She does not drive.  Needs help with certain shopping activities, but otherwise is independent.  She is on multiple medications including Benicar, glimepiride, Actos, Klor-Con, Integra, omeprazole, aspirin, Crestor, furosemide, fluticasone, Advair, over-the-counter ibuprofen.  [facility] include Voltaren gel as well as Ultracet.  She feels like the tramadol has worked better for her than the Ultracet and is wondering if she can take this along with some Tylenol.  PHYSICAL EXAMINATION:  VITAL SIGNS:  Blood pressure 142/72, pulse 64, respirations 18, and O2 sat 93% on room air. GENERAL:  Overweight female in no acute distress.  Orientation x3. Affect is alert.  Gait is short-step length, widened base support. MUSCULOSKELETAL:  She has some pain with hip internal-external rotation, right side.  She has negative straight leg raising test. Normal strength in lower extremity as well as upper extremity.  The wrist has no pain with range of motion.  Back has no tenderness to palpation.  IMPRESSION: 1. Lumbar spondylosis improvement after physical therapy and TENS     unit. 2. Hip pain, right side radiating to the groin, suspect     osteoarthritis.  She has had negative x-rays of the knees.  We will     check a PA pelvis to evaluate.  We will compare left side and right     side.  She has no symptoms on the left side.  I will see her back in 4 months.     Erick Colace, M.D. Electronically Signed    AEK/MedQ D:  06/18/2011 14:36:27  T:  06/18/2011 21:48:15  Job #:  [id number]

## 2011-06-22 ENCOUNTER — Encounter: Payer: Self-pay | Admitting: Cardiovascular Disease

## 2011-06-22 ENCOUNTER — Ambulatory Visit (INDEPENDENT_AMBULATORY_CARE_PROVIDER_SITE_OTHER): Payer: Medicare Other | Admitting: Cardiovascular Disease

## 2011-06-22 VITALS — BP 127/71 | HR 74 | Ht 60.0 in | Wt 185.8 lb

## 2011-06-22 DIAGNOSIS — I10 Essential (primary) hypertension: Secondary | ICD-10-CM

## 2011-06-22 MED ORDER — FUROSEMIDE 40 MG PO TABS: 40.0000 mg | ORAL_TABLET | Freq: Every day | ORAL | Status: DC

## 2011-06-22 MED ORDER — NITROGLYCERIN 0.4 MG SL SUBL: 0.4000 mg | SUBLINGUAL_TABLET | SUBLINGUAL | Status: DC | PRN

## 2011-06-22 NOTE — Patient Instructions (Addendum)
Your physician recommends that you schedule a follow-up appointment in: YEAR WITH DR Eden Emms DR BURNEY'S OFF TO NOTIFY  OF APPT IN 3 MONTHS  Your physician has recommended you make the following change in your medication: INCREASE FUROSEMIDE TO 40 MG EVERY  DAY

## 2011-06-22 NOTE — Assessment & Plan Note (Signed)
No change in murmur F/U echo in a year 

## 2011-06-22 NOTE — Assessment & Plan Note (Signed)
Cholesterol is at goal.  Continue current dose of statin and diet Rx.  No myalgias or side effects.  F/U  LFT's in 6 months. No results found for this basename: LDLCALC             

## 2011-06-22 NOTE — Assessment & Plan Note (Signed)
F/U Dr Edwyna Shell  Consider referral to [facility] if over 5.5 cm as she is not a good open surgical candidate

## 2011-06-22 NOTE — Assessment & Plan Note (Signed)
Well controlled.  Continue current medications and low sodium Dash type diet.    

## 2011-06-22 NOTE — Assessment & Plan Note (Signed)
Low sodium diet  Lasix daily script called in  Suppl with KCL to avoid increasing cramps

## 2011-06-22 NOTE — Progress Notes (Signed)
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 I reviewed a letter from Dr. Edwyna Shell indicating that her ascending aorta had reached 5 cm. She was referred to [facility] for further evaluation. She does not strike me as an ideal operative candidate. Her hypertension has been under good control. She has had her amlodimpine stopped and Procardia increased to 60 mg which I think is fine. She has significant lower extremity edema from varicose veins and this is harder to treat while on this particular oral hypoglycemic. Otherwise she's not had any significant chest pain. She has mild chronic edema she has mild chronic exertional dyspnea. I believe she sees Dr. Bernette Mayers for her carcinoid. I reviewed her CT from last August and the ascending Ao is stable at 5.0cm with stable metastatic carcinoid burden  No SSCP  Encouraged to take lasix daily for edema  Needs F/U with Edwyna Shell regarding thoracic aneurysm  My understanding is [facility] is stenting some throracic disease and she may be a candidate for this   ROS: Denies fever, malais, weight loss, blurry vision, decreased visual acuity, cough, sputum, SOB, hemoptysis, pleuritic pain, palpitaitons, heartburn, abdominal pain, melena, lower extremity edema, claudication, or rash.  All other systems reviewed and negative  General: Affect appropriate Healthy:  appears stated age HEENT: normal Neck supple with no adenopathy JVP normal no bruits no thyromegaly Lungs clear with no wheezing and good diaphragmatic motion Heart:  S1/S2 AR  murmur,rub, gallop or click PMI normal Abdomen: benighn, BS positve, no tenderness, no AAA no bruit.  No HSM or HJR Distal pulses intact with no bruits Plus one bilateal  edema Neuro non-focal Skin warm and dry No muscular weakness   Current Outpatient Prescriptions  Medication Sig Dispense Refill  . acetaminophen  (TYLENOL) 325 MG tablet Take 650 mg by mouth every 6 (six) hours as needed.        Marland Kitchen albuterol (PROVENTIL HFA) 108 (90 BASE) MCG/ACT inhaler Inhale 2 puffs into the lungs every 6 (six) hours as needed.        Marland Kitchen aspirin 81 MG tablet Take 81 mg by mouth daily.        . chlorpheniramine-HYDROcodone (TUSSIONEX PENNKINETIC ER) 10-8 MG/5ML LQCR Take 5 mLs by mouth as needed.        . FeFum-FePoly-FA-B Cmp-C-Biot (INTEGRA PLUS) CAPS Take 1 capsule by mouth daily.        . fluticasone (FLONASE) 50 MCG/ACT nasal spray Place 2 sprays into the nose daily.        . Fluticasone-Salmeterol (ADVAIR) 100-50 MCG/DOSE AEPB Inhale 1 puff into the lungs daily.        . furosemide (LASIX) 20 MG tablet Take 20 mg by mouth daily.        . Ginkgo Biloba EXTR Take 1 tablet by mouth daily.        Marland Kitchen glimepiride (AMARYL) 1 MG tablet Take 1 mg by mouth daily.        . indapamide (LOZOL) 2.5 MG tablet TAKE 1 TABLET BY MOUTH EVERY DAY  30 tablet  11  . multivitamin (THERAGRAN) per tablet Take 1 tablet by mouth daily.        Marland Kitchen NIFEdipine (PROCARDIA XL/ADALAT-CC) 60 MG 24 hr tablet Take 60 mg by mouth daily.        . nitroGLYCERIN (NITROSTAT) 0.4 MG SL tablet Place 0.4 mg under the tongue every 5 (five) minutes as needed.        Marland Kitchen  olmesartan (BENICAR) 40 MG tablet Take 1 tablet (40 mg total) by mouth 2 (two) times daily.  60 tablet  5  . omeprazole (PRILOSEC) 40 MG capsule Take 40 mg by mouth daily.        . pioglitazone (ACTOS) 45 MG tablet Take 45 mg by mouth daily.        . potassium chloride (KLOR-CON) 10 MEQ CR tablet Take 10 mEq by mouth 2 (two) times daily.       . rosuvastatin (CRESTOR) 5 MG tablet Take 5 mg by mouth daily.          Allergies  Review of patient's allergies indicates not on file.  Electrocardiogram: NSR 74 LAFB LVH  Assessment and Plan e

## 2011-07-19 ENCOUNTER — Inpatient Hospital Stay (INDEPENDENT_AMBULATORY_CARE_PROVIDER_SITE_OTHER)
Admission: RE | Admit: 2011-07-19 | Discharge: 2011-07-19 | Disposition: A | Discharge status: Home / Self Care | Payer: Medicare Other | Source: Ambulatory Visit | Attending: Family Medicine | Admitting: Family Medicine

## 2011-07-19 DIAGNOSIS — J069 Acute upper respiratory infection, unspecified: Secondary | ICD-10-CM

## 2011-07-20 ENCOUNTER — Ambulatory Visit: Payer: Medicare Other | Admitting: Physical Medicine & Rehabilitation

## 2011-07-23 DIAGNOSIS — R05 Cough: Secondary | ICD-10-CM | POA: Insufficient documentation

## 2011-07-23 DIAGNOSIS — J309 Allergic rhinitis, unspecified: Secondary | ICD-10-CM | POA: Insufficient documentation

## 2011-07-23 DIAGNOSIS — J45909 Unspecified asthma, uncomplicated: Secondary | ICD-10-CM | POA: Insufficient documentation

## 2011-08-11 ENCOUNTER — Other Ambulatory Visit: Payer: Self-pay | Admitting: Thoracic Surgery

## 2011-08-11 DIAGNOSIS — D381 Neoplasm of uncertain behavior of trachea, bronchus and lung: Secondary | ICD-10-CM

## 2011-08-30 ENCOUNTER — Encounter: Payer: Self-pay | Admitting: Thoracic Surgery

## 2011-09-07 ENCOUNTER — Ambulatory Visit: Payer: Medicare Other | Admitting: Thoracic Surgery

## 2011-09-07 ENCOUNTER — Other Ambulatory Visit: Payer: Medicare Other

## 2011-09-21 ENCOUNTER — Ambulatory Visit: Payer: Medicare Other | Admitting: Thoracic Surgery

## 2011-09-21 ENCOUNTER — Other Ambulatory Visit: Payer: Medicare Other

## 2011-09-24 ENCOUNTER — Other Ambulatory Visit: Payer: Self-pay | Admitting: Obstetrics & Gynecology

## 2011-09-24 DIAGNOSIS — Z1231 Encounter for screening mammogram for malignant neoplasm of breast: Secondary | ICD-10-CM

## 2011-09-28 ENCOUNTER — Encounter: Payer: Self-pay | Admitting: Thoracic Surgery

## 2011-09-28 ENCOUNTER — Ambulatory Visit (INDEPENDENT_AMBULATORY_CARE_PROVIDER_SITE_OTHER): Payer: Medicare Other | Admitting: Thoracic Surgery

## 2011-09-28 ENCOUNTER — Ambulatory Visit
Admission: RE | Admit: 2011-09-28 | Discharge: 2011-09-28 | Disposition: A | Discharge status: Home / Self Care | Payer: Medicare Other | Source: Ambulatory Visit | Attending: Thoracic Surgery | Admitting: Thoracic Surgery

## 2011-09-28 VITALS — BP 148/79 | HR 78 | Resp 18

## 2011-09-28 DIAGNOSIS — D381 Neoplasm of uncertain behavior of trachea, bronchus and lung: Secondary | ICD-10-CM

## 2011-09-28 DIAGNOSIS — Z85118 Personal history of other malignant neoplasm of bronchus and lung: Secondary | ICD-10-CM

## 2011-09-28 NOTE — Progress Notes (Signed)
HPI WE have been following this patient with multiple carcinoid syndrome for several years. CT scan today was read as possible increase in nodules and size of nodules. I reviewed this with the CT scan of 6 months ago and do not really see a great increase in either the number of nodules or the size of the nodules. Her she also has a ascending aortic aneurysm which is stable at 5 cm. She is being followed for her cardiac problems by Dr. Eden Emms. I feel there is nothing needs to be done now. I will see her back again in 6 months with another CT scan. Given her age I think a conservative approach is the best treatment. The carcinoid that was endobronchial in the right upper lobe has not recurred. Current Outpatient Prescriptions  Medication Sig Dispense Refill  . acetaminophen (TYLENOL) 325 MG tablet Take 650 mg by mouth every 6 (six) hours as needed.        Marland Kitchen albuterol (PROVENTIL HFA) 108 (90 BASE) MCG/ACT inhaler Inhale 2 puffs into the lungs every 6 (six) hours as needed.        Marland Kitchen aspirin 81 MG tablet Take 81 mg by mouth daily.        . chlorpheniramine-HYDROcodone (TUSSIONEX PENNKINETIC ER) 10-8 MG/5ML LQCR Take 5 mLs by mouth as needed.        . FeFum-FePoly-FA-B Cmp-C-Biot (INTEGRA PLUS) CAPS Take 1 capsule by mouth daily.        . fluticasone (FLONASE) 50 MCG/ACT nasal spray Place 2 sprays into the nose daily.        . Fluticasone-Salmeterol (ADVAIR) 100-50 MCG/DOSE AEPB Inhale 1 puff into the lungs daily.        . furosemide (LASIX) 40 MG tablet Take 1 tablet (40 mg total) by mouth daily.  30 tablet  11  . Ginkgo Biloba EXTR Take 1 tablet by mouth daily.        Marland Kitchen glimepiride (AMARYL) 1 MG tablet Take 1 mg by mouth daily.        . indapamide (LOZOL) 2.5 MG tablet TAKE 1 TABLET BY MOUTH EVERY DAY  30 tablet  11  . multivitamin (THERAGRAN) per tablet Take 1 tablet by mouth daily.        Marland Kitchen NIFEdipine (PROCARDIA XL/ADALAT-CC) 60 MG 24 hr tablet Take 60 mg by mouth daily.        . nitroGLYCERIN  (NITROSTAT) 0.4 MG SL tablet Place 1 tablet (0.4 mg total) under the tongue every 5 (five) minutes as needed.  90 tablet  1  . olmesartan (BENICAR) 40 MG tablet Take 1 tablet (40 mg total) by mouth 2 (two) times daily.  60 tablet  5  . omeprazole (PRILOSEC) 40 MG capsule Take 40 mg by mouth daily.        . pioglitazone (ACTOS) 45 MG tablet Take 45 mg by mouth daily.        . potassium chloride (KLOR-CON) 10 MEQ CR tablet Take 10 mEq by mouth 2 (two) times daily.       . rosuvastatin (CRESTOR) 5 MG tablet Take 5 mg by mouth daily.           Review of Systems: Unchanged   Physical Exam lungs were clear to auscultation percussion   Diagnostic Tests: CT scan showed the multiple pulmonary nodules   Impression: Multiple carcinoid syndrome ascending aortic   Plan: Followup 6 months with CT scan of chest

## 2011-09-30 ENCOUNTER — Ambulatory Visit (HOSPITAL_COMMUNITY)
Admission: RE | Admit: 2011-09-30 | Discharge: 2011-09-30 | Disposition: A | Discharge status: Home / Self Care | Payer: Medicare Other | Source: Ambulatory Visit | Attending: Physical Medicine & Rehabilitation | Admitting: Physical Medicine & Rehabilitation

## 2011-09-30 ENCOUNTER — Other Ambulatory Visit: Payer: Self-pay | Admitting: Physical Medicine & Rehabilitation

## 2011-09-30 DIAGNOSIS — M25559 Pain in unspecified hip: Secondary | ICD-10-CM

## 2011-10-14 ENCOUNTER — Emergency Department (INDEPENDENT_AMBULATORY_CARE_PROVIDER_SITE_OTHER): Payer: Medicare Other

## 2011-10-14 ENCOUNTER — Encounter (HOSPITAL_COMMUNITY): Payer: Self-pay | Admitting: *Deleted

## 2011-10-14 ENCOUNTER — Emergency Department (HOSPITAL_COMMUNITY): Payer: Medicare Other

## 2011-10-14 ENCOUNTER — Emergency Department (INDEPENDENT_AMBULATORY_CARE_PROVIDER_SITE_OTHER)
Admission: EM | Admit: 2011-10-14 | Discharge: 2011-10-14 | Disposition: A | Discharge status: Home / Self Care | Payer: Medicare Other | Source: Home / Self Care | Attending: Emergency Medicine | Admitting: Emergency Medicine

## 2011-10-14 DIAGNOSIS — J40 Bronchitis, not specified as acute or chronic: Secondary | ICD-10-CM

## 2011-10-14 DIAGNOSIS — J45909 Unspecified asthma, uncomplicated: Secondary | ICD-10-CM

## 2011-10-14 MED ORDER — IPRATROPIUM BROMIDE 0.02 % IN SOLN
0.5000 mg | Freq: Once | RESPIRATORY_TRACT | Status: AC
  Administered 2011-10-14: 0.5 mg via RESPIRATORY_TRACT

## 2011-10-14 MED ORDER — HYDROCOD POLST-CHLORPHEN POLST 10-8 MG/5ML PO LQCR: 5.0000 mL | Freq: Two times a day (BID) | ORAL | Status: DC | PRN

## 2011-10-14 MED ORDER — ALBUTEROL SULFATE (5 MG/ML) 0.5% IN NEBU
INHALATION_SOLUTION | RESPIRATORY_TRACT | Status: AC
  Filled 2011-10-14: qty 1

## 2011-10-14 MED ORDER — AZITHROMYCIN 250 MG PO TABS: ORAL_TABLET | ORAL | Status: DC

## 2011-10-14 MED ORDER — ALBUTEROL SULFATE (5 MG/ML) 0.5% IN NEBU
5.0000 mg | INHALATION_SOLUTION | Freq: Once | RESPIRATORY_TRACT | Status: AC
  Administered 2011-10-14: 5 mg via RESPIRATORY_TRACT

## 2011-10-14 NOTE — ED Notes (Signed)
Cough   /  Congested          X 2  Days           Bringing  Up  Productive  Sputum            denys  Any  Chest  Pain     sorethroat                        Pt  Is  Awake  As  Well as  Alert  Family member  At  Bedside

## 2011-10-14 NOTE — ED Provider Notes (Signed)
History     CSN: [id number]  Arrival date & time 10/14/11  1316   First MD Initiated Contact with Patient 10/14/11 1551      Chief Complaint  Patient presents with  . Cough    (Consider location/radiation/quality/duration/timing/severity/associated sxs/prior treatment) HPI Comments: The patient is a 77 year old [name] with a history of asthma and diabetes who had a 2 to three-day history of a cough productive of white sputum, wheezing, sore throat, and nasal congestion and she denies any fever, chills, or headache.  Patient is a 77 y.o. [name] presenting with cough.  Cough Associated symptoms include rhinorrhea and wheezing. Pertinent negatives include no chills, no ear pain, no sore throat, no shortness of breath and no eye redness.    Past Medical History  Diagnosis Date  . Cancer   . Diabetes mellitus   . Hypertension   . Coronary artery disease   . Aortic insufficiency   . Hyperlipidemia   . Aortic aneurysm   . Hypercholesterolemia   . COPD (chronic obstructive pulmonary disease)   . Bronchitis   . Edema   . GERD (gastroesophageal reflux disease)   . Aortic valve disease     stable    Past Surgical History  Procedure Date  . Pars plana vitrectomy     right eye  . Posterior capsulectomy     right eye  . Pars plana vitrectomy w/ endophotocoagulation     right eye  . Fiberoptic bronch with endobronchial u/s 07/02/2010  . Video bronchoscopy 12/09/2009  . Endobronchial excision of right upper lobe tumor with laser bronchi 10/06/2009  . Bronch with endobronchial biopies 09/15/2009    History reviewed. No pertinent family history.  History  Substance Use Topics  . Smoking status: Never Smoker   . Smokeless tobacco: Not on file  . Alcohol Use: No    OB History    Grav Para Term Preterm Abortions TAB SAB Ect Mult Living                  Review of Systems  Constitutional: Negative for fever, chills and fatigue.  HENT: Positive for congestion and  rhinorrhea. Negative for ear pain, sore throat, sneezing, neck stiffness, voice change and postnasal drip.   Eyes: Negative for pain, discharge and redness.  Respiratory: Positive for cough and wheezing. Negative for chest tightness and shortness of breath.   Gastrointestinal: Negative for nausea, vomiting, abdominal pain and diarrhea.  Skin: Negative for rash.    Allergies  Review of patient's allergies indicates no known allergies.  Home Medications   Current Outpatient Rx  Name Route Sig Dispense Refill  . ACETAMINOPHEN 325 MG PO TABS Oral Take 650 mg by mouth every 6 (six) hours as needed.      . ALBUTEROL SULFATE HFA 108 (90 BASE) MCG/ACT IN AERS Inhalation Inhale 2 puffs into the lungs every 6 (six) hours as needed.      . ASPIRIN 81 MG PO TABS Oral Take 81 mg by mouth daily.      . AZITHROMYCIN 250 MG PO TABS  Take as directed. 6 tablet 0  . HYDROCOD POLST-CHLORPHEN POLST 10-8 MG/5ML PO LQCR Oral Take 5 mLs by mouth as needed.      Marland Kitchen HYDROCOD POLST-CHLORPHEN POLST 10-8 MG/5ML PO LQCR Oral Take 5 mLs by mouth every 12 (twelve) hours as needed. 140 mL 0  . INTEGRA PLUS PO CAPS Oral Take 1 capsule by mouth daily.      Marland Kitchen FLUTICASONE PROPIONATE 50  MCG/ACT NA SUSP Nasal Place 2 sprays into the nose daily.      Marland Kitchen FLUTICASONE-SALMETEROL 100-50 MCG/DOSE IN AEPB Inhalation Inhale 1 puff into the lungs daily.      . FUROSEMIDE 40 MG PO TABS Oral Take 1 tablet (40 mg total) by mouth daily. 30 tablet 11  . GINKGO BILOBA EXTR Oral Take 1 tablet by mouth daily.      Marland Kitchen GLIMEPIRIDE 1 MG PO TABS Oral Take 1 mg by mouth daily.      . INDAPAMIDE 2.5 MG PO TABS  TAKE 1 TABLET BY MOUTH EVERY DAY 30 tablet 11  . MULTIVITAMINS PO TABS Oral Take 1 tablet by mouth daily.      Marland Kitchen NIFEDIPINE ER OSMOTIC 60 MG PO TB24 Oral Take 60 mg by mouth daily.      Marland Kitchen NITROGLYCERIN 0.4 MG SL SUBL Sublingual Place 1 tablet (0.4 mg total) under the tongue every 5 (five) minutes as needed. 90 tablet 1  . OLMESARTAN MEDOXOMIL  40 MG PO TABS Oral Take 1 tablet (40 mg total) by mouth 2 (two) times daily. 60 tablet 5  . OMEPRAZOLE 40 MG PO CPDR Oral Take 40 mg by mouth daily.      Marland Kitchen PIOGLITAZONE HCL 45 MG PO TABS Oral Take 45 mg by mouth daily.      Marland Kitchen POTASSIUM CHLORIDE 10 MEQ PO TBCR Oral Take 10 mEq by mouth 2 (two) times daily.     Marland Kitchen ROSUVASTATIN CALCIUM 5 MG PO TABS Oral Take 5 mg by mouth daily.        BP 138/60  Pulse 87  Temp(Src) 98.6 F (37 C) (Oral)  Resp 16  SpO2 96%  Physical Exam  Nursing note and vitals reviewed. Constitutional: She appears well-developed and well-nourished. No distress.  HENT:  Head: Normocephalic and atraumatic.  Right Ear: External ear normal.  Left Ear: External ear normal.  Nose: Nose normal.  Mouth/Throat: Oropharynx is clear and moist. No oropharyngeal exudate.  Eyes: Conjunctivae and EOM are normal. Pupils are equal, round, and reactive to light. Right eye exhibits no discharge. Left eye exhibits no discharge.  Neck: Normal range of motion. Neck supple.  Cardiovascular: Normal rate, regular rhythm and normal heart sounds.   Pulmonary/Chest: Effort normal. No stridor. No respiratory distress. She has wheezes (Her lungs were very noisy.  She has diffuse, bilateral inspiratory and expiratory wheezes and rhonchi.). She has no rales. She exhibits no tenderness.  Lymphadenopathy:    She has no cervical adenopathy.  Skin: Skin is warm and dry. No rash noted. She is not diaphoretic.    ED Course  Procedures (including critical care time)  Labs Reviewed - No data to display Dg Chest 2 View  10/14/2011  *RADIOLOGY REPORT*  Clinical Data: Cough  CHEST - 2 VIEW  Comparison: CT chest dated 09/28/2011.  Findings: Bilateral lower lobe opacities, likely atelectasis / scarring.  Known numerous bilateral pulmonary nodules are better demonstrated on CT.  Mild cardiomegaly.  Degenerative changes of the visualized thoracolumbar spine.  IMPRESSION: Bilateral lower lobe opacities, likely  atelectasis / scarring.  Known numerous bilateral pulmonary nodules are better demonstrated on CT.  Mild cardiomegaly.  Original Report Authenticated By: Charline Bills, M.D.     1. Bronchitis   2. Asthma       MDM  She has asthma and acute bronchitis. She has gotten good results with Zithromax in the past, so she was given a prescription for this. She is on 3 different  inhalers and she was encouraged to continue on with these. I don't want to give her any prednisone since she's a diabetic and this would as her blood sugar to elevate.        Roque Lias, MD 10/14/11 [phone]

## 2011-10-20 ENCOUNTER — Inpatient Hospital Stay (HOSPITAL_COMMUNITY)
Admission: EM | Admit: 2011-10-20 | Discharge: 2011-10-30 | DRG: 202 | Disposition: A | Discharge status: Home / Self Care | Payer: Medicare Other | Attending: Family Medicine | Admitting: Family Medicine

## 2011-10-20 ENCOUNTER — Encounter (HOSPITAL_COMMUNITY): Payer: Self-pay | Admitting: Emergency Medicine

## 2011-10-20 ENCOUNTER — Emergency Department (HOSPITAL_COMMUNITY): Payer: Medicare Other

## 2011-10-20 ENCOUNTER — Other Ambulatory Visit: Payer: Self-pay

## 2011-10-20 DIAGNOSIS — I719 Aortic aneurysm of unspecified site, without rupture: Secondary | ICD-10-CM | POA: Diagnosis present

## 2011-10-20 DIAGNOSIS — I2510 Atherosclerotic heart disease of native coronary artery without angina pectoris: Secondary | ICD-10-CM | POA: Diagnosis present

## 2011-10-20 DIAGNOSIS — E876 Hypokalemia: Secondary | ICD-10-CM | POA: Diagnosis not present

## 2011-10-20 DIAGNOSIS — K219 Gastro-esophageal reflux disease without esophagitis: Secondary | ICD-10-CM

## 2011-10-20 DIAGNOSIS — D3A00 Benign carcinoid tumor of unspecified site: Secondary | ICD-10-CM | POA: Diagnosis present

## 2011-10-20 DIAGNOSIS — Z79899 Other long term (current) drug therapy: Secondary | ICD-10-CM

## 2011-10-20 DIAGNOSIS — J441 Chronic obstructive pulmonary disease with (acute) exacerbation: Secondary | ICD-10-CM

## 2011-10-20 DIAGNOSIS — E1151 Type 2 diabetes mellitus with diabetic peripheral angiopathy without gangrene: Secondary | ICD-10-CM | POA: Diagnosis present

## 2011-10-20 DIAGNOSIS — I498 Other specified cardiac arrhythmias: Secondary | ICD-10-CM | POA: Diagnosis not present

## 2011-10-20 DIAGNOSIS — I359 Nonrheumatic aortic valve disorder, unspecified: Secondary | ICD-10-CM

## 2011-10-20 DIAGNOSIS — E119 Type 2 diabetes mellitus without complications: Secondary | ICD-10-CM

## 2011-10-20 DIAGNOSIS — I712 Thoracic aortic aneurysm, without rupture, unspecified: Secondary | ICD-10-CM | POA: Diagnosis present

## 2011-10-20 DIAGNOSIS — R000 Tachycardia, unspecified: Secondary | ICD-10-CM

## 2011-10-20 DIAGNOSIS — E7800 Pure hypercholesterolemia, unspecified: Secondary | ICD-10-CM

## 2011-10-20 DIAGNOSIS — J209 Acute bronchitis, unspecified: Principal | ICD-10-CM | POA: Diagnosis present

## 2011-10-20 DIAGNOSIS — Z7982 Long term (current) use of aspirin: Secondary | ICD-10-CM

## 2011-10-20 DIAGNOSIS — J449 Chronic obstructive pulmonary disease, unspecified: Secondary | ICD-10-CM

## 2011-10-20 DIAGNOSIS — N179 Acute kidney failure, unspecified: Secondary | ICD-10-CM | POA: Diagnosis not present

## 2011-10-20 DIAGNOSIS — C7B8 Other secondary neuroendocrine tumors: Secondary | ICD-10-CM | POA: Diagnosis present

## 2011-10-20 DIAGNOSIS — R197 Diarrhea, unspecified: Secondary | ICD-10-CM

## 2011-10-20 DIAGNOSIS — R609 Edema, unspecified: Secondary | ICD-10-CM | POA: Diagnosis present

## 2011-10-20 DIAGNOSIS — E785 Hyperlipidemia, unspecified: Secondary | ICD-10-CM

## 2011-10-20 DIAGNOSIS — J40 Bronchitis, not specified as acute or chronic: Secondary | ICD-10-CM

## 2011-10-20 DIAGNOSIS — I10 Essential (primary) hypertension: Secondary | ICD-10-CM | POA: Diagnosis present

## 2011-10-20 LAB — PRO B NATRIURETIC PEPTIDE: Pro B Natriuretic peptide (BNP): 673.1 pg/mL — ABNORMAL HIGH (ref 0–450)

## 2011-10-20 LAB — BASIC METABOLIC PANEL
CO2: 27 mEq/L (ref 19–32)
Chloride: 99 mEq/L (ref 96–112)
Creatinine, Ser: 0.64 mg/dL (ref 0.50–1.10)

## 2011-10-20 LAB — CBC
MCV: 90.4 fL (ref 78.0–100.0)
Platelets: 260 10*3/uL (ref 150–400)
RBC: 4.16 MIL/uL (ref 3.87–5.11)
WBC: 8.9 10*3/uL (ref 4.0–10.5)

## 2011-10-20 MED ORDER — ONDANSETRON HCL 4 MG/2ML IJ SOLN: 4.0000 mg | Freq: Four times a day (QID) | INTRAMUSCULAR | Status: DC | PRN

## 2011-10-20 MED ORDER — SODIUM CHLORIDE 0.9 % IV SOLN
Freq: Once | INTRAVENOUS | Status: AC
  Administered 2011-10-20: 18:00:00 via INTRAVENOUS

## 2011-10-20 MED ORDER — POLYSACCHARIDE IRON 150 MG PO CAPS
150.0000 mg | ORAL_CAPSULE | Freq: Every day | ORAL | Status: DC
  Administered 2011-10-21 – 2011-10-30 (×9): 150 mg via ORAL
  Filled 2011-10-20 (×11): qty 1

## 2011-10-20 MED ORDER — ROSUVASTATIN CALCIUM 5 MG PO TABS
5.0000 mg | ORAL_TABLET | Freq: Every day | ORAL | Status: DC
  Administered 2011-10-21 – 2011-10-29 (×9): 5 mg via ORAL
  Filled 2011-10-20 (×14): qty 1

## 2011-10-20 MED ORDER — METHYLPREDNISOLONE SODIUM SUCC 125 MG IJ SOLR
125.0000 mg | Freq: Once | INTRAMUSCULAR | Status: AC
  Administered 2011-10-20: 125 mg via INTRAVENOUS
  Filled 2011-10-20: qty 2

## 2011-10-20 MED ORDER — SODIUM CHLORIDE 0.9 % IJ SOLN
3.0000 mL | Freq: Two times a day (BID) | INTRAMUSCULAR | Status: DC
  Administered 2011-10-21 – 2011-10-30 (×19): 3 mL via INTRAVENOUS

## 2011-10-20 MED ORDER — FUROSEMIDE 40 MG PO TABS
40.0000 mg | ORAL_TABLET | Freq: Every day | ORAL | Status: DC
  Administered 2011-10-21: 40 mg via ORAL
  Filled 2011-10-20: qty 1

## 2011-10-20 MED ORDER — OLMESARTAN MEDOXOMIL 40 MG PO TABS
40.0000 mg | ORAL_TABLET | Freq: Every day | ORAL | Status: DC
  Administered 2011-10-21: 40 mg via ORAL
  Filled 2011-10-20: qty 1

## 2011-10-20 MED ORDER — ACETAMINOPHEN 650 MG RE SUPP: 650.0000 mg | Freq: Four times a day (QID) | RECTAL | Status: DC | PRN

## 2011-10-20 MED ORDER — MOXIFLOXACIN HCL IN NACL 400 MG/250ML IV SOLN
400.0000 mg | Freq: Once | INTRAVENOUS | Status: AC
  Administered 2011-10-20: 400 mg via INTRAVENOUS
  Filled 2011-10-20: qty 250

## 2011-10-20 MED ORDER — ACETAMINOPHEN 325 MG PO TABS: 650.0000 mg | ORAL_TABLET | Freq: Four times a day (QID) | ORAL | Status: DC | PRN

## 2011-10-20 MED ORDER — METHYLPREDNISOLONE SODIUM SUCC 40 MG IJ SOLR
40.0000 mg | Freq: Two times a day (BID) | INTRAMUSCULAR | Status: DC
  Administered 2011-10-21 – 2011-10-22 (×4): 40 mg via INTRAVENOUS
  Filled 2011-10-20 (×5): qty 1

## 2011-10-20 MED ORDER — POTASSIUM CHLORIDE CRYS ER 10 MEQ PO TBCR
10.0000 meq | EXTENDED_RELEASE_TABLET | Freq: Once | ORAL | Status: DC
  Filled 2011-10-20: qty 1

## 2011-10-20 MED ORDER — THERA M PLUS PO TABS
1.0000 | ORAL_TABLET | Freq: Every day | ORAL | Status: DC
  Administered 2011-10-21 – 2011-10-30 (×10): 1 via ORAL
  Filled 2011-10-20 (×10): qty 1

## 2011-10-20 MED ORDER — FLUTICASONE PROPIONATE 50 MCG/ACT NA SUSP
2.0000 | Freq: Every day | NASAL | Status: DC
  Administered 2011-10-21 – 2011-10-30 (×10): 2 via NASAL
  Filled 2011-10-20: qty 16

## 2011-10-20 MED ORDER — INDAPAMIDE 2.5 MG PO TABS
2.5000 mg | ORAL_TABLET | ORAL | Status: DC
  Administered 2011-10-21 – 2011-10-30 (×10): 2.5 mg via ORAL
  Filled 2011-10-20 (×12): qty 1

## 2011-10-20 MED ORDER — MOXIFLOXACIN HCL IN NACL 400 MG/250ML IV SOLN
400.0000 mg | INTRAVENOUS | Status: DC
  Administered 2011-10-21 – 2011-10-27 (×7): 400 mg via INTRAVENOUS
  Filled 2011-10-20 (×7): qty 250

## 2011-10-20 MED ORDER — PANTOPRAZOLE SODIUM 40 MG PO TBEC
40.0000 mg | DELAYED_RELEASE_TABLET | Freq: Every day | ORAL | Status: DC
  Administered 2011-10-21 – 2011-10-25 (×5): 40 mg via ORAL
  Filled 2011-10-20 (×5): qty 1

## 2011-10-20 MED ORDER — ASPIRIN 81 MG PO CHEW
81.0000 mg | CHEWABLE_TABLET | Freq: Every day | ORAL | Status: DC
  Administered 2011-10-21 – 2011-10-30 (×10): 81 mg via ORAL
  Filled 2011-10-20 (×10): qty 1

## 2011-10-20 MED ORDER — ALBUTEROL SULFATE (5 MG/ML) 0.5% IN NEBU
2.5000 mg | INHALATION_SOLUTION | RESPIRATORY_TRACT | Status: DC | PRN
  Administered 2011-10-21: 2.5 mg via RESPIRATORY_TRACT
  Filled 2011-10-20: qty 0.5

## 2011-10-20 MED ORDER — ALBUTEROL SULFATE (5 MG/ML) 0.5% IN NEBU
5.0000 mg | INHALATION_SOLUTION | Freq: Once | RESPIRATORY_TRACT | Status: AC
  Administered 2011-10-20: 5 mg via RESPIRATORY_TRACT
  Filled 2011-10-20: qty 1

## 2011-10-20 MED ORDER — GLIMEPIRIDE 1 MG PO TABS
1.0000 mg | ORAL_TABLET | Freq: Every day | ORAL | Status: DC
  Administered 2011-10-21 – 2011-10-30 (×9): 1 mg via ORAL
  Filled 2011-10-20 (×13): qty 1

## 2011-10-20 MED ORDER — NIFEDIPINE ER 60 MG PO TB24
60.0000 mg | ORAL_TABLET | Freq: Every day | ORAL | Status: DC
  Administered 2011-10-21 – 2011-10-22 (×2): 60 mg via ORAL
  Filled 2011-10-20 (×3): qty 1

## 2011-10-20 MED ORDER — ONDANSETRON HCL 4 MG PO TABS: 4.0000 mg | ORAL_TABLET | Freq: Four times a day (QID) | ORAL | Status: DC | PRN

## 2011-10-20 MED ORDER — NITROGLYCERIN 0.4 MG SL SUBL: 0.4000 mg | SUBLINGUAL_TABLET | SUBLINGUAL | Status: DC | PRN

## 2011-10-20 MED ORDER — OSELTAMIVIR PHOSPHATE 75 MG PO CAPS
75.0000 mg | ORAL_CAPSULE | Freq: Two times a day (BID) | ORAL | Status: DC
  Administered 2011-10-21 (×2): 75 mg via ORAL
  Filled 2011-10-20 (×3): qty 1

## 2011-10-20 MED ORDER — PIOGLITAZONE HCL 45 MG PO TABS
45.0000 mg | ORAL_TABLET | Freq: Every day | ORAL | Status: DC
  Administered 2011-10-21 – 2011-10-30 (×10): 45 mg via ORAL
  Filled 2011-10-20 (×11): qty 1

## 2011-10-20 MED ORDER — IPRATROPIUM BROMIDE 0.02 % IN SOLN
0.5000 mg | Freq: Once | RESPIRATORY_TRACT | Status: AC
  Administered 2011-10-20: 0.5 mg via RESPIRATORY_TRACT
  Filled 2011-10-20: qty 2.5

## 2011-10-20 MED ORDER — INTEGRA PLUS PO CAPS: 1.0000 | ORAL_CAPSULE | Freq: Every day | ORAL | Status: DC

## 2011-10-20 MED ORDER — ALBUTEROL SULFATE (5 MG/ML) 0.5% IN NEBU
2.5000 mg | INHALATION_SOLUTION | Freq: Four times a day (QID) | RESPIRATORY_TRACT | Status: DC
  Administered 2011-10-21: 2.5 mg via RESPIRATORY_TRACT
  Filled 2011-10-20: qty 0.5

## 2011-10-20 MED ORDER — BUDESONIDE 0.5 MG/2ML IN SUSP
0.5000 mg | Freq: Two times a day (BID) | RESPIRATORY_TRACT | Status: DC
  Administered 2011-10-21 – 2011-10-23 (×5): 0.5 mg via RESPIRATORY_TRACT
  Filled 2011-10-20 (×8): qty 2

## 2011-10-20 MED ORDER — INSULIN ASPART 100 UNIT/ML ~~LOC~~ SOLN
0.0000 [IU] | Freq: Three times a day (TID) | SUBCUTANEOUS | Status: DC
  Administered 2011-10-21: 2 [IU] via SUBCUTANEOUS
  Administered 2011-10-21 – 2011-10-22 (×3): 7 [IU] via SUBCUTANEOUS
  Administered 2011-10-22: 3 [IU] via SUBCUTANEOUS
  Administered 2011-10-22: 1 [IU] via SUBCUTANEOUS
  Administered 2011-10-23: 3 [IU] via SUBCUTANEOUS
  Filled 2011-10-20: qty 3

## 2011-10-20 NOTE — ED Notes (Signed)
Family at bedside. 

## 2011-10-20 NOTE — ED Notes (Signed)
hospitalist at bedside. Awaiting transfer to [facility].

## 2011-10-20 NOTE — H&P (Signed)
[name] is an 77 y.o. female.   PCP - Dr Nadara Mustard at [facility] [address]. Chief Complaint: Shortness of breath. HPI: 77 year-old female with a history of metastatic carcinoid tumor, diabetes mellitus type 2, ascending aortic aneurysm last measured was 5 cm and is being followed with Dr. Eden Emms cardiology present with on going shortness of breath over the last 2 weeks with subjective feeling of fever chills cough. She had gone to her primary care physician last week and was prescribed antibiotics despite which the patient did not improve so patient came to the ER. Patient denies any chest pain, nausea vomiting, abdominal pain, diarrhea or any flushing symptoms. Chest x-ray at this time does not show any infiltrates patient will be admitted for bronchitis.  Past Medical History  Diagnosis Date  . Cancer   . Diabetes mellitus   . Hypertension   . Coronary artery disease   . Aortic insufficiency   . Hyperlipidemia   . Aortic aneurysm   . Hypercholesterolemia   . COPD (chronic obstructive pulmonary disease)   . Bronchitis   . Edema   . GERD (gastroesophageal reflux disease)   . Aortic valve disease     stable    Past Surgical History  Procedure Date  . Pars plana vitrectomy     right eye  . Posterior capsulectomy     right eye  . Pars plana vitrectomy w/ endophotocoagulation     right eye  . Fiberoptic bronch with endobronchial u/s 07/02/2010  . Video bronchoscopy 12/09/2009  . Endobronchial excision of right upper lobe tumor with laser bronchi 10/06/2009  . Bronch with endobronchial biopies 09/15/2009    History reviewed. No pertinent family history. Social History:  reports that she has never smoked. She does not have any smokeless tobacco history on file. She reports that she does not drink alcohol. Her drug history not on file.  Allergies: No Known Allergies  Medications Prior to Admission  Medication Dose Route Frequency Provider Last Rate Last Dose  . 0.9 %   sodium chloride infusion   Intravenous Once Ethelda Chick, MD 125 mL/hr at 10/20/11 1759    . albuterol (PROVENTIL) (5 MG/ML) 0.5% nebulizer solution 5 mg  5 mg Nebulization Once Ethelda Chick, MD   5 mg at 10/20/11 1800  . albuterol (PROVENTIL) (5 MG/ML) 0.5% nebulizer solution 5 mg  5 mg Nebulization Once Ethelda Chick, MD   5 mg at 10/20/11 1909  . ipratropium (ATROVENT) nebulizer solution 0.5 mg  0.5 mg Nebulization Once Ethelda Chick, MD   0.5 mg at 10/20/11 1800  . ipratropium (ATROVENT) nebulizer solution 0.5 mg  0.5 mg Nebulization Once Ethelda Chick, MD   0.5 mg at 10/20/11 1909  . methylPREDNISolone sodium succinate (SOLU-MEDROL) 125 MG injection 125 mg  125 mg Intravenous Once Ethelda Chick, MD   125 mg at 10/20/11 1800  . moxifloxacin (AVELOX) IVPB 400 mg  400 mg Intravenous Once Ethelda Chick, MD   400 mg at 10/20/11 1812   Medications Prior to Admission  Medication Sig Dispense Refill  . albuterol (PROVENTIL HFA) 108 (90 BASE) MCG/ACT inhaler Inhale 2 puffs into the lungs every 6 (six) hours as needed.        Marland Kitchen aspirin 81 MG tablet Take 81 mg by mouth daily.        . chlorpheniramine-HYDROcodone (TUSSIONEX PENNKINETIC ER) 10-8 MG/5ML LQCR Take 5 mLs by mouth as needed.        Marland Kitchen  FeFum-FePoly-FA-B Cmp-C-Biot (INTEGRA PLUS) CAPS Take 1 capsule by mouth daily.        . fluticasone (FLONASE) 50 MCG/ACT nasal spray Place 2 sprays into the nose daily.        . Fluticasone-Salmeterol (ADVAIR) 100-50 MCG/DOSE AEPB Inhale 1 puff into the lungs daily.        . furosemide (LASIX) 40 MG tablet Take 1 tablet (40 mg total) by mouth daily.  30 tablet  11  . Ginkgo Biloba EXTR Take 1 tablet by mouth daily.        Marland Kitchen glimepiride (AMARYL) 1 MG tablet Take 1 mg by mouth daily.        . multivitamin (THERAGRAN) per tablet Take 1 tablet by mouth daily.        Marland Kitchen NIFEdipine (PROCARDIA XL/ADALAT-CC) 60 MG 24 hr tablet Take 60 mg by mouth daily.        Marland Kitchen omeprazole (PRILOSEC) 40 MG capsule Take  40 mg by mouth daily.        . pioglitazone (ACTOS) 45 MG tablet Take 45 mg by mouth daily.        . potassium chloride (KLOR-CON) 10 MEQ CR tablet Take 10 mEq by mouth 2 (two) times daily.       . rosuvastatin (CRESTOR) 5 MG tablet Take 5 mg by mouth daily.        Marland Kitchen acetaminophen (TYLENOL) 325 MG tablet Take 650 mg by mouth every 6 (six) hours as needed.        . nitroGLYCERIN (NITROSTAT) 0.4 MG SL tablet Place 1 tablet (0.4 mg total) under the tongue every 5 (five) minutes as needed.  90 tablet  1    Results for orders placed during the hospital encounter of 10/20/11 (from the past 48 hour(s))  CBC     Status: Normal   Collection Time   10/20/11  6:07 PM      Component Value Range Comment   WBC 8.9  4.0 - 10.5 (K/uL)    RBC 4.16  3.87 - 5.11 (MIL/uL)    Hemoglobin 12.9  12.0 - 15.0 (g/dL)    HCT 86.5  78.4 - 69.6 (%)    MCV 90.4  78.0 - 100.0 (fL)    MCH 31.0  26.0 - 34.0 (pg)    MCHC 34.3  30.0 - 36.0 (g/dL)    RDW 29.5  28.4 - 13.2 (%)    Platelets 260  150 - 400 (K/uL)   BASIC METABOLIC PANEL     Status: Abnormal   Collection Time   10/20/11  6:07 PM      Component Value Range Comment   Sodium 137  135 - 145 (mEq/L)    Potassium 4.5  3.5 - 5.1 (mEq/L) HEMOLYSIS AT THIS LEVEL MAY AFFECT RESULT   Chloride 99  96 - 112 (mEq/L)    CO2 27  19 - 32 (mEq/L)    Glucose, Bld 107 (*) 70 - 99 (mg/dL)    BUN 23  6 - 23 (mg/dL)    Creatinine, Ser 4.40  0.50 - 1.10 (mg/dL)    Calcium 9.6  8.4 - 10.5 (mg/dL)    GFR calc non Af Amer 84 (*) >90 (mL/min)    GFR calc Af Amer >90  >90 (mL/min)   PRO B NATRIURETIC PEPTIDE     Status: Abnormal   Collection Time   10/20/11  6:09 PM      Component Value Range Comment   Pro B Natriuretic peptide (BNP)  673.1 (*) 0 - 450 (pg/mL)    Dg Chest 2 View  10/20/2011  *RADIOLOGY REPORT*  Clinical Data: Cough and wheezing.  Coronary artery disease. Hypertension and diabetes.  CHEST - 2 VIEW  Comparison: 10/14/2011 and 09/30/2010  Findings: Mild  bilateral lower lobe scarring is again seen without significant change. No evidence of acute infiltrate or edema.  No evidence of pleural effusion.  Mild to moderate cardiomegaly stable.  Tortuosity of the thoracic aorta is also unchanged.  No mass or lymphadenopathy identified. No significant change seen compared to prior exam.  IMPRESSION: Stable cardiomegaly and mild bibasilar scarring.  No active disease.  Original Report Authenticated By: Danae Orleans, M.D.    Review of Systems  Constitutional: Positive for fever.  HENT: Negative.   Eyes: Negative.   Respiratory: Positive for cough, sputum production, shortness of breath and wheezing.   Cardiovascular: Negative.   Gastrointestinal: Negative.   Genitourinary: Negative.   Musculoskeletal: Negative.   Skin: Negative.   Endo/Heme/Allergies: Negative.   Psychiatric/Behavioral: Negative.     Blood pressure 143/79, pulse 101, temperature 97.5 F (36.4 C), temperature source Oral, resp. rate 16, height 5' (1.524 m), weight 82.555 kg (182 lb), SpO2 93.00%. Physical Exam  Constitutional: She is oriented to person, place, and time. She appears well-developed and well-nourished. No distress.  HENT:  Head: Normocephalic and atraumatic.  Right Ear: External ear normal.  Left Ear: External ear normal.  Nose: Nose normal.  Mouth/Throat: Oropharynx is clear and moist. No oropharyngeal exudate.  Eyes: Conjunctivae and EOM are normal. Pupils are equal, round, and reactive to light. Right eye exhibits no discharge. Left eye exhibits no discharge. No scleral icterus.  Neck: Normal range of motion. Neck supple.  Cardiovascular: Normal rate, regular rhythm and normal heart sounds.   Respiratory: Effort normal. No respiratory distress. She has wheezes. She exhibits no tenderness.  GI: Soft. Bowel sounds are normal. She exhibits no distension. There is no tenderness. There is no rebound.  Musculoskeletal: Normal range of motion. She exhibits no edema  and no tenderness.  Neurological: She is alert and oriented to person, place, and time. She has normal reflexes. No cranial nerve deficit. Coordination normal.  Skin: Skin is warm and dry. No rash noted. She is not diaphoretic. No erythema.  Psychiatric: Her behavior is normal.     Assessment/Plan #1. Acute bronchitis - patient is still short of breath and have failed outpatient therapy Will place patient on IV antibiotics with the steroids and nebulizer along with Pulmicort and close the observed in the hospital. We'll check flu PCR at this time I am going keep patient on Tamiflu. #2. Metastatic carcinoid tumor - patient is being followed by Dr. Edwyna Shell. Presently her symptoms are more likely from bronchitis but may let Dr. Edwyna Shell know about the patient's admission in a.m. #3. Ascending aortic aneurysm - patient is being followed with Dr. Eden Emms, at [facility] cardiology. Presently patient has no chest pain. #4. Diabetes mellitus type 2 - continue Amaryl and will place patient on sliding scale coverage and as patient is going to be on steroids closely follow [facility]. #5. Hypertension - continue her present medications.  CODE STATUS - full code. Eduard Clos. 10/20/2011, 8:11 PM

## 2011-10-20 NOTE — ED Notes (Signed)
[id number] ready Ramon aware

## 2011-10-20 NOTE — ED Notes (Signed)
Pt here with cough and congestion, was seen on 12/20 and dx'ed with asthma and bronchitis.  Pt given prescription for zpack and tussinex.  States that she took all the antibiotics and she is still feeling bad.  Pt very congested and wheezing.  Pt a/o, skin w/d,, mae x 4

## 2011-10-20 NOTE — ED Provider Notes (Signed)
History     CSN: [id number]  Arrival date & time 10/20/11  1327   First MD Initiated Contact with Patient 10/20/11 1656      Chief Complaint  Patient presents with  . Cough    (Consider location/radiation/quality/duration/timing/severity/associated sxs/prior treatment) HPI Patient with history of COPD and bronchitis presents with continued cough and chest congestion. She has taken a course of azithromycin for bronchitis and finished this one to 2 days ago. Family and patient state that her cough and wheezing is the same if not worse. She has not had fever or chills. Her cough is productive. She denies any chest pain or leg swelling. She's had no vomiting and has been drinking fluids well at home. She was not prescribed steroids at her prior visit due to concerns of her diabetes. There no other alleviating or modifying factors. There no other associated systemic symptoms. Symptoms are continuous and described as moderate the  Past Medical History  Diagnosis Date  . Cancer   . Diabetes mellitus   . Hypertension   . Coronary artery disease   . Aortic insufficiency   . Hyperlipidemia   . Aortic aneurysm   . Hypercholesterolemia   . COPD (chronic obstructive pulmonary disease)   . Bronchitis   . Edema   . GERD (gastroesophageal reflux disease)   . Aortic valve disease     stable    Past Surgical History  Procedure Date  . Pars plana vitrectomy     right eye  . Posterior capsulectomy     right eye  . Pars plana vitrectomy w/ endophotocoagulation     right eye  . Fiberoptic bronch with endobronchial u/s 07/02/2010  . Video bronchoscopy 12/09/2009  . Endobronchial excision of right upper lobe tumor with laser bronchi 10/06/2009  . Bronch with endobronchial biopies 09/15/2009    History reviewed. No pertinent family history.  History  Substance Use Topics  . Smoking status: Never Smoker   . Smokeless tobacco: Not on file  . Alcohol Use: No    OB History    Grav  Para Term Preterm Abortions TAB SAB Ect Mult Living                  Review of Systems ROS reviewed and otherwise negative except for mentioned in HPI  Allergies  Review of patient's allergies indicates no known allergies.  Home Medications   Current Outpatient Rx  Name Route Sig Dispense Refill  . ALBUTEROL SULFATE HFA 108 (90 BASE) MCG/ACT IN AERS Inhalation Inhale 2 puffs into the lungs every 6 (six) hours as needed.      . ASPIRIN 81 MG PO TABS Oral Take 81 mg by mouth daily.      Marland Kitchen HYDROCOD POLST-CHLORPHEN POLST 10-8 MG/5ML PO LQCR Oral Take 5 mLs by mouth as needed.      Arnette Schaumann PLUS PO CAPS Oral Take 1 capsule by mouth daily.      Marland Kitchen FLUTICASONE PROPIONATE 50 MCG/ACT NA SUSP Nasal Place 2 sprays into the nose daily.      Marland Kitchen FLUTICASONE-SALMETEROL 100-50 MCG/DOSE IN AEPB Inhalation Inhale 1 puff into the lungs daily.      . FUROSEMIDE 40 MG PO TABS Oral Take 1 tablet (40 mg total) by mouth daily. 30 tablet 11  . GINKGO BILOBA EXTR Oral Take 1 tablet by mouth daily.      Marland Kitchen GLIMEPIRIDE 1 MG PO TABS Oral Take 1 mg by mouth daily.      Marland Kitchen  INDAPAMIDE 2.5 MG PO TABS Oral Take 2.5 mg by mouth every morning.      . MULTIVITAMINS PO TABS Oral Take 1 tablet by mouth daily.      Marland Kitchen NIFEDIPINE ER OSMOTIC 60 MG PO TB24 Oral Take 60 mg by mouth daily.      Marland Kitchen OLMESARTAN MEDOXOMIL 40 MG PO TABS Oral Take 40 mg by mouth daily.      Marland Kitchen OMEPRAZOLE 40 MG PO CPDR Oral Take 40 mg by mouth daily.      Marland Kitchen PIOGLITAZONE HCL 45 MG PO TABS Oral Take 45 mg by mouth daily.      Marland Kitchen POTASSIUM CHLORIDE 10 MEQ PO TBCR Oral Take 10 mEq by mouth 2 (two) times daily.     Marland Kitchen ROSUVASTATIN CALCIUM 5 MG PO TABS Oral Take 5 mg by mouth daily.      . ACETAMINOPHEN 325 MG PO TABS Oral Take 650 mg by mouth every 6 (six) hours as needed.      Marland Kitchen NITROGLYCERIN 0.4 MG SL SUBL Sublingual Place 1 tablet (0.4 mg total) under the tongue every 5 (five) minutes as needed. 90 tablet 1    BP 143/79  Pulse 101  Temp(Src) 97.5 F (36.4  C) (Oral)  Resp 16  Ht 5' (1.524 m)  Wt 182 lb (82.555 kg)  BMI 35.54 kg/m2  SpO2 93% Vitals reviewed Physical Exam Physical Examination: General appearance - alert, well appearing, and in no distress Mental status - alert, oriented to person, place, and time Eyes - pupils equal and reactive, extraocular eye movements intact Mouth - mucous membranes moist, pharynx normal without lesions Neck - supple, no significant adenopathy Chest - diffuse bilateral expiratory wheezes with scattered rhonchi, symmetric air entry, no dyspnea, pt speaking in full sentences Heart - normal rate, regular rhythm, normal S1, S2, no murmurs, rubs, clicks or gallops Abdomen - soft, nontender, nondistended, no masses or organomegaly Neurological - alert, oriented, normal speech, no focal findings or movement disorder noted Musculoskeletal - no joint tenderness, deformity or swelling Extremities - peripheral pulses normal, no pedal edema, no clubbing or cyanosis Skin - normal coloration and turgor, no rashes  ED Course  Procedures (including critical care time)   Date: 10/20/2011  Rate: 76  Rhythm: sinus rhythm with first degree AV block  QRS Axis: left  Intervals: PR prolonged  ST/T Wave abnormalities: normal  Conduction Disutrbances:first-degree A-V block   Narrative Interpretation:   Old EKG Reviewed: changes noted Prolonged PR increased from prior EKG of 10/06/09   7:28 PM d/w [facility] for admission.  Pt to be admitted to team 2, telemetry    Labs Reviewed  BASIC METABOLIC PANEL - Abnormal; Notable for the following:    Glucose, Bld 107 (*)    GFR calc non Af Amer 84 (*)    All other components within normal limits  PRO B NATRIURETIC PEPTIDE - Abnormal; Notable for the following:    Pro B Natriuretic peptide (BNP) 673.1 (*)    All other components within normal limits  CBC   Dg Chest 2 View  10/20/2011  *RADIOLOGY REPORT*  Clinical Data: Cough and wheezing.  Coronary artery disease.  Hypertension and diabetes.  CHEST - 2 VIEW  Comparison: 10/14/2011 and 09/30/2010  Findings: Mild bilateral lower lobe scarring is again seen without significant change. No evidence of acute infiltrate or edema.  No evidence of pleural effusion.  Mild to moderate cardiomegaly stable.  Tortuosity of the thoracic aorta is also unchanged.  No  mass or lymphadenopathy identified. No significant change seen compared to prior exam.  IMPRESSION: Stable cardiomegaly and mild bibasilar scarring.  No active disease.  Original Report Authenticated By: Danae Orleans, M.D.     1. COPD exacerbation       MDM  Patient presenting with cough and wheezing. She is arty taken course of Zithromax which did not help to improve her symptoms. She was treated in the ED with albuterol and Atrovent as well as started on Solu-Medrol and moxifloxacin. She continued to have wheezing and cough despite breathing treatments. Due to her age comorbidities and failure of outpatient antibiotic treatment patient was admitted for acute COPD exacerbation. She will be admitted to [facility] hospitalist service.        Ethelda Chick, MD 10/20/11 [phone]

## 2011-10-20 NOTE — ED Notes (Signed)
Paged [facility] 

## 2011-10-20 NOTE — ED Notes (Signed)
Pt c/o cough and congestion in chest with fever x several days; pt sts was seen for same and given antibiotics but not feeling better

## 2011-10-21 LAB — COMPREHENSIVE METABOLIC PANEL
ALT: 17 U/L (ref 0–35)
AST: 22 U/L (ref 0–37)
Albumin: 3.5 g/dL (ref 3.5–5.2)
Alkaline Phosphatase: 79 U/L (ref 39–117)
Chloride: 100 mEq/L (ref 96–112)
Potassium: 3.8 mEq/L (ref 3.5–5.1)
Total Bilirubin: 0.3 mg/dL (ref 0.3–1.2)

## 2011-10-21 LAB — CBC
MCH: 30.1 pg (ref 26.0–34.0)
MCHC: 33 g/dL (ref 30.0–36.0)
MCV: 91.2 fL (ref 78.0–100.0)
Platelets: 285 10*3/uL (ref 150–400)
RDW: 13.5 % (ref 11.5–15.5)

## 2011-10-21 LAB — HEMOGLOBIN A1C: Hgb A1c MFr Bld: 6.7 % — ABNORMAL HIGH (ref <5.7)

## 2011-10-21 LAB — GLUCOSE, CAPILLARY
Glucose-Capillary: 194 mg/dL — ABNORMAL HIGH (ref 70–99)
Glucose-Capillary: 341 mg/dL — ABNORMAL HIGH (ref 70–99)
Glucose-Capillary: 134 mg/dL — ABNORMAL HIGH (ref 70–99)

## 2011-10-21 MED ORDER — ALBUTEROL SULFATE (5 MG/ML) 0.5% IN NEBU
2.5000 mg | INHALATION_SOLUTION | Freq: Four times a day (QID) | RESPIRATORY_TRACT | Status: DC
  Administered 2011-10-21 – 2011-10-22 (×3): 2.5 mg via RESPIRATORY_TRACT
  Filled 2011-10-21 (×4): qty 0.5

## 2011-10-21 MED ORDER — OLMESARTAN MEDOXOMIL 20 MG PO TABS
20.0000 mg | ORAL_TABLET | Freq: Two times a day (BID) | ORAL | Status: DC
  Administered 2011-10-21: 20 mg via ORAL
  Filled 2011-10-21 (×2): qty 1

## 2011-10-21 MED ORDER — GLUCERNA 1.2 CAL PO LIQD
237.0000 mL | Freq: Three times a day (TID) | ORAL | Status: DC
  Administered 2011-10-21 – 2011-10-22 (×4): 237 mL via ORAL
  Filled 2011-10-21 (×11): qty 237

## 2011-10-21 MED ORDER — OLMESARTAN MEDOXOMIL 20 MG PO TABS
20.0000 mg | ORAL_TABLET | Freq: Once | ORAL | Status: AC
  Administered 2011-10-21: 20 mg via ORAL
  Filled 2011-10-21: qty 1

## 2011-10-21 MED ORDER — BENZONATATE 100 MG PO CAPS
200.0000 mg | ORAL_CAPSULE | Freq: Three times a day (TID) | ORAL | Status: DC | PRN
  Administered 2011-10-21 – 2011-10-25 (×4): 200 mg via ORAL
  Filled 2011-10-21 (×6): qty 2

## 2011-10-21 MED ORDER — OLMESARTAN MEDOXOMIL 40 MG PO TABS
40.0000 mg | ORAL_TABLET | Freq: Two times a day (BID) | ORAL | Status: DC
  Administered 2011-10-22 – 2011-10-23 (×4): 40 mg via ORAL
  Filled 2011-10-21 (×7): qty 1

## 2011-10-21 MED ORDER — GUAIFENESIN-DM 100-10 MG/5ML PO SYRP
5.0000 mL | ORAL_SOLUTION | ORAL | Status: DC | PRN
  Administered 2011-10-21 – 2011-10-22 (×5): 5 mL via ORAL
  Filled 2011-10-21 (×6): qty 5

## 2011-10-21 MED ORDER — IPRATROPIUM BROMIDE 0.02 % IN SOLN
0.5000 mg | Freq: Four times a day (QID) | RESPIRATORY_TRACT | Status: DC
  Administered 2011-10-21 – 2011-10-23 (×8): 0.5 mg via RESPIRATORY_TRACT
  Filled 2011-10-21 (×9): qty 2.5

## 2011-10-21 MED ORDER — FUROSEMIDE 40 MG PO TABS
40.0000 mg | ORAL_TABLET | Freq: Every day | ORAL | Status: DC
  Administered 2011-10-22 – 2011-10-30 (×9): 40 mg via ORAL
  Filled 2011-10-21 (×10): qty 1

## 2011-10-21 NOTE — Progress Notes (Addendum)
Subjective: 77 year-old female with a history of metastatic carcinoid tumor, diabetes mellitus type 2, ascending aortic aneurysm (last measured was 5 cm and is being followed with Dr. Eden Emms cardiology) who presented with on going shortness of breath over the last 2 weeks with subjective feeling of fever chills cough.  Today she reports that her breathing is still somewhat labored.  She continues with a thick productive cough and expiratory wheezing.  She denies chest pain nausea vomiting or abdominal pain.  She does report intermittent chills.  Objective: Weight change:  No intake or output data in the 24 hours ending 10/21/11 1426 Blood pressure 126/64, pulse 89, temperature 98.9 F (37.2 C), temperature source Oral, resp. rate 18, height 5' (1.524 m), weight 80.3 kg (177 lb 0.5 oz), SpO2 94.00%.  Physical Exam: General: No acute respiratory distress Lungs: Mild diffuse wheezes with coarse upper respiratory crackles transmitted throughout Cardiovascular: Regular rate and rhythm without murmur gallop or rub normal S1 and S2 Abdomen: Nontender, nondistended, soft, bowel sounds positive, no rebound, no ascites, no appreciable mass Extremities: No significant cyanosis, clubbing, or edema bilateral lower extremities  Lab Results:  [facility] 10/21/11 0630 10/20/11 1807  NA 140 137  K 3.8 4.5  CL 100 99  CO2 25 27  GLUCOSE 265* 107*  BUN 20 23  CREATININE 0.68 0.64  CALCIUM 9.3 9.6  MG -- --  PHOS -- --    Basename 10/21/11 0630  AST 22  ALT 17  ALKPHOS 79  BILITOT 0.3  PROT 7.7  ALBUMIN 3.5    Basename 10/21/11 0630 10/20/11 1807  WBC 8.0 8.9  NEUTROABS -- --  HGB 12.6 12.9  HCT 38.2 37.6  MCV 91.2 90.4  PLT 285 260    Basename 10/21/11 0630  HGBA1C 6.7*   Assessment/Plan:  Acute infectious BRONCHITIS Influenza screen negative - no focal infiltrate noted on CXR - continue empiric antibiotic and nebulizers - we will continue inhaled steroids but may need to consider  higher dose systemic steroids if her symptoms do not soon improved  DM A1c of 6.7 reflects reasonable control - no changes in treatment plan today  HYPERCHOLESTEROLEMIA Continue medical therapy  KNOWN THORACIC AORTIC ANEURYSM has been followed on a serial basis via CT of chest - deemed to be stable per her last cardiology eval   METASTATIC Carcinoid tumor OF THE LUNGS the patient was seen by Dr. Edwyna Shell earlier this month (09/28/11) at which time he felt that her pulmonary disease was stable - he plans to obtain a f/u CT in 6 months time - he has no other procedures planned at the present time   HYPERTENSION Reasonably well controlled at present    LOS: 1 day   Antavious Spanos T 10/21/2011, 2:26 PM

## 2011-10-22 ENCOUNTER — Ambulatory Visit: Payer: Medicare Other

## 2011-10-22 ENCOUNTER — Other Ambulatory Visit: Payer: Self-pay

## 2011-10-22 LAB — GLUCOSE, CAPILLARY
Glucose-Capillary: 142 mg/dL — ABNORMAL HIGH (ref 70–99)
Glucose-Capillary: 288 mg/dL — ABNORMAL HIGH (ref 70–99)
Glucose-Capillary: 328 mg/dL — ABNORMAL HIGH (ref 70–99)
Glucose-Capillary: 332 mg/dL — ABNORMAL HIGH (ref 70–99)

## 2011-10-22 MED ORDER — LEVALBUTEROL HCL 0.63 MG/3ML IN NEBU
0.6300 mg | INHALATION_SOLUTION | Freq: Four times a day (QID) | RESPIRATORY_TRACT | Status: DC
  Administered 2011-10-23 (×2): 0.63 mg via RESPIRATORY_TRACT
  Filled 2011-10-22 (×7): qty 3

## 2011-10-22 MED ORDER — METHYLPREDNISOLONE SODIUM SUCC 40 MG IJ SOLR
40.0000 mg | Freq: Three times a day (TID) | INTRAMUSCULAR | Status: DC
  Administered 2011-10-22 – 2011-10-23 (×3): 40 mg via INTRAVENOUS
  Filled 2011-10-22 (×6): qty 1

## 2011-10-22 MED ORDER — INSULIN ASPART 100 UNIT/ML ~~LOC~~ SOLN
8.0000 [IU] | Freq: Once | SUBCUTANEOUS | Status: AC
  Administered 2011-10-22: 8 [IU] via SUBCUTANEOUS

## 2011-10-22 MED ORDER — INSULIN ASPART 100 UNIT/ML ~~LOC~~ SOLN
3.0000 [IU] | Freq: Three times a day (TID) | SUBCUTANEOUS | Status: DC
  Administered 2011-10-22 – 2011-10-23 (×2): 3 [IU] via SUBCUTANEOUS

## 2011-10-22 NOTE — Progress Notes (Signed)
Patients heart rate increased to 150 while up in bathroom; also occasionally up in 110-120's sinus tachycardia. Non sustained; Dr. Cleotis Lema text paged to make aware. Pt asymptomatic. [name], [employer]

## 2011-10-22 NOTE — Progress Notes (Signed)
Utilization Review Completed.[name] T12/28/2012   

## 2011-10-22 NOTE — Progress Notes (Signed)
Inpatient Diabetes Program Recommendations  AACE/ADA: New Consensus Statement on Inpatient Glycemic Control (2009)  Target Ranges:  Prepandial:   less than 140 mg/dL      Peak postprandial:   less than 180 mg/dL (1-2 hours)      Critically ill patients:  140 - 180 mg/dL   Reason for Visit: CBG's elevated. Pt. is on IV steroids. A1C 6.7% indicating good glycemic control prior to admit.  Inpatient Diabetes Program Recommendations Insulin - Meal Coverage: Consider adding Novolog meal coverage 3 units tid with meals while on steroids.

## 2011-10-22 NOTE — Progress Notes (Signed)
Subjective: This is seen and examined, complaining of cough, shortness of breath and wheezing.  Objective: Vital signs in last 24 hours: Temp:  [97.3 F (36.3 C)-97.7 F (36.5 C)] 97.7 F (36.5 C) (12/28 1400) Pulse Rate:  [77-88] 82  (12/28 1400) Resp:  [17-18] 17  (12/28 1400) BP: (122-134)/(70-81) 134/79 mmHg (12/28 1400) SpO2:  [93 %-98 %] 94 % (12/28 1400) Weight change:     Intake/Output from previous day: 12/27 0701 - 12/28 0700 In: 250 [IV Piggyback:250] Out: -      Physical Exam: General: Alert, awake, oriented x3, in no acute distress. Heart: Regular rate and rhythm, without murmurs, rubs, gallops. Lungs: Coarse breathing sounds and expiratory wheezes noted Abdomen: Soft, nontender, nondistended, positive bowel sounds. Extremities: No clubbing cyanosis or edema with positive pedal pulses. Neuro: Grossly intact, nonfocal.    Lab Results: Results for orders placed during the hospital encounter of 10/20/11 (from the past 24 hour(s))  GLUCOSE, CAPILLARY     Status: Abnormal   Collection Time   10/21/11  4:58 PM      Component Value Range   Glucose-Capillary 341 (*) 70 - 99 (mg/dL)  GLUCOSE, CAPILLARY     Status: Abnormal   Collection Time   10/21/11  8:48 PM      Component Value Range   Glucose-Capillary 134 (*) 70 - 99 (mg/dL)  GLUCOSE, CAPILLARY     Status: Abnormal   Collection Time   10/22/11  7:46 AM      Component Value Range   Glucose-Capillary 142 (*) 70 - 99 (mg/dL)   Comment 1 Documented in Chart     Comment 2 Notify RN    GLUCOSE, CAPILLARY     Status: Abnormal   Collection Time   10/22/11 11:00 AM      Component Value Range   Glucose-Capillary 218 (*) 70 - 99 (mg/dL)   Comment 1 Documented in Chart     Comment 2 Notify RN      Studies/Results: Dg Chest 2 View  10/20/2011  *RADIOLOGY REPORT*  Clinical Data: Cough and wheezing.  Coronary artery disease.   IMPRESSION: Stable cardiomegaly and mild bibasilar scarring.  No active disease.   Original Report Authenticated By: Danae Orleans, M.D.    Medications:    . albuterol  2.5 mg Nebulization QID  . aspirin  81 mg Oral Daily  . budesonide  0.5 mg Nebulization BID  . feeding supplement (GLUCERNA 1.2 CAL)  237 mL Oral TID WC  . fluticasone  2 spray Each Nare Daily  . furosemide  40 mg Oral Daily  . glimepiride  1 mg Oral Q breakfast  . indapamide  2.5 mg Oral Q0700  . insulin aspart  0-9 Units Subcutaneous TID WC  . ipratropium  0.5 mg Nebulization QID  . methylPREDNISolone (SOLU-MEDROL) injection  40 mg Intravenous Q12H  . moxifloxacin  400 mg Intravenous Q24H  . multivitamins ther. w/minerals  1 tablet Oral Daily  . NIFEdipine  60 mg Oral Daily  . olmesartan  20 mg Oral Once  . olmesartan  40 mg Oral BID  . pantoprazole  40 mg Oral Q1200  . pioglitazone  45 mg Oral Daily  . polysaccharide iron  150 mg Oral Daily  . potassium chloride  10 mEq Oral Once  . rosuvastatin  5 mg Oral Daily  . sodium chloride  3 mL Intravenous Q12H  . DISCONTD: olmesartan  20 mg Oral BID    acetaminophen, acetaminophen, albuterol, benzonatate, guaiFENesin-dextromethorphan,  nitroGLYCERIN, ondansetron (ZOFRAN) IV, ondansetron     Assessment/Plan:  Acute infectious BRONCHITIS  Influenza screen negative - no focal infiltrate noted on CXR - continue empiric antibiotic and nebulizers . Given no improvement I will increase Solu-Medrol to 40 mg every 8 hours. We will consider pulmonary consult if her symptoms don't improve. DM  A1c of 6.7 . CBC is elevated possibly because of steroids. We will continue Glimpride  And Actos , SSI and would add NovoLog coverage 3 units with meals. HYPERCHOLESTEROLEMIA  Continue medical therapy  KNOWN THORACIC AORTIC ANEURYSM  has been followed on a serial basis via CT of chest - deemed to be stable per her last cardiology eval  METASTATIC Carcinoid tumor OF THE LUNGS  the patient was seen by Dr. Edwyna Shell earlier this month (09/28/11) at which time he felt  that her pulmonary disease was stable - he plans to obtain a f/u CT in 6 months time  HYPERTENSION   controlled    LOS: 2 days   [name] 10/22/2011, 3:59 PM

## 2011-10-22 NOTE — Progress Notes (Signed)
Pt's Hr elevated sustaining in the 110-120's earlier this shift. Md made aware. New order for an EKG which showed sinus rhythem and a one time order for novolog for pt's  elevated sugar. No other orders received.  When pt went to sleep her Hr went to the 60-70's. Will cont.  Monitor pt.

## 2011-10-23 DIAGNOSIS — R0602 Shortness of breath: Secondary | ICD-10-CM

## 2011-10-23 DIAGNOSIS — C3490 Malignant neoplasm of unspecified part of unspecified bronchus or lung: Secondary | ICD-10-CM

## 2011-10-23 LAB — GLUCOSE, CAPILLARY
Glucose-Capillary: 222 mg/dL — ABNORMAL HIGH (ref 70–99)
Glucose-Capillary: 265 mg/dL — ABNORMAL HIGH (ref 70–99)
Glucose-Capillary: 241 mg/dL — ABNORMAL HIGH (ref 70–99)

## 2011-10-23 MED ORDER — METOPROLOL TARTRATE 12.5 MG HALF TABLET
12.5000 mg | ORAL_TABLET | Freq: Two times a day (BID) | ORAL | Status: DC
  Administered 2011-10-23 – 2011-10-30 (×15): 12.5 mg via ORAL
  Filled 2011-10-23 (×18): qty 1

## 2011-10-23 MED ORDER — NIFEDIPINE ER 30 MG PO TB24
30.0000 mg | ORAL_TABLET | Freq: Once | ORAL | Status: AC
  Administered 2011-10-23: 30 mg via ORAL
  Filled 2011-10-23: qty 1

## 2011-10-23 MED ORDER — INSULIN ASPART 100 UNIT/ML ~~LOC~~ SOLN
0.0000 [IU] | Freq: Every day | SUBCUTANEOUS | Status: DC
  Filled 2011-10-23: qty 3

## 2011-10-23 MED ORDER — INSULIN ASPART 100 UNIT/ML ~~LOC~~ SOLN
5.0000 [IU] | Freq: Three times a day (TID) | SUBCUTANEOUS | Status: DC
  Administered 2011-10-23 – 2011-10-24 (×4): 5 [IU] via SUBCUTANEOUS
  Filled 2011-10-23: qty 3

## 2011-10-23 MED ORDER — HYDROCOD POLST-CHLORPHEN POLST 10-8 MG/5ML PO LQCR
5.0000 mL | Freq: Two times a day (BID) | ORAL | Status: DC
  Administered 2011-10-23 – 2011-10-30 (×14): 5 mL via ORAL
  Filled 2011-10-23 (×14): qty 5

## 2011-10-23 MED ORDER — NIFEDIPINE ER 30 MG PO TB24
30.0000 mg | ORAL_TABLET | Freq: Every day | ORAL | Status: DC
  Filled 2011-10-23: qty 1

## 2011-10-23 MED ORDER — LEVALBUTEROL HCL 0.63 MG/3ML IN NEBU
0.6300 mg | INHALATION_SOLUTION | Freq: Four times a day (QID) | RESPIRATORY_TRACT | Status: DC | PRN
  Administered 2011-10-24 – 2011-10-25 (×4): 0.63 mg via RESPIRATORY_TRACT
  Filled 2011-10-23 (×3): qty 3

## 2011-10-23 MED ORDER — INSULIN ASPART 100 UNIT/ML ~~LOC~~ SOLN
0.0000 [IU] | Freq: Three times a day (TID) | SUBCUTANEOUS | Status: DC
  Administered 2011-10-23: 11 [IU] via SUBCUTANEOUS
  Administered 2011-10-23 – 2011-10-24 (×2): 7 [IU] via SUBCUTANEOUS
  Administered 2011-10-24: 3 [IU] via SUBCUTANEOUS
  Filled 2011-10-23: qty 3

## 2011-10-23 MED ORDER — LEVALBUTEROL HCL 0.63 MG/3ML IN NEBU: 0.6300 mg | INHALATION_SOLUTION | Freq: Four times a day (QID) | RESPIRATORY_TRACT | Status: DC

## 2011-10-23 MED ORDER — GLUCERNA SHAKE PO LIQD
237.0000 mL | Freq: Three times a day (TID) | ORAL | Status: DC
  Administered 2011-10-23 – 2011-10-30 (×14): 237 mL via ORAL
  Filled 2011-10-23 (×4): qty 237

## 2011-10-23 NOTE — Progress Notes (Signed)
Subjective:  Patient seen and examined, still complaining of hacking cough and shortness of breath.  Objective: Vital signs in last 24 hours: Temp:  [97.3 F (36.3 C)-97.6 F (36.4 C)] 97.6 F (36.4 C) (12/29 0612) Pulse Rate:  [101] 101  (12/29 0612) Resp:  [20] 20  (12/29 0612) BP: (134-158)/(66-85) 158/66 mmHg (12/29 0612) SpO2:  [94 %-99 %] 95 % (12/29 1204) Weight change:  Last BM Date: 10/22/11  Intake/Output from previous day: 12/28 0701 - 12/29 0700 In: 240 [P.O.:240] Out: -      Physical Exam: General: Alert, awake, oriented x3, in no acute distress.  Heart: Regular rate and rhythm, without murmurs, rubs, gallops.  Lungs: Coarse breathing sounds and expiratory wheezes noted  Abdomen: Soft, nontender, nondistended, positive bowel sounds.  Extremities: No clubbing cyanosis or edema with positive pedal pulses.  Neuro: Grossly intact, nonfocal     Lab Results: Results for orders placed during the hospital encounter of 10/20/11 (from the past 24 hour(s))  GLUCOSE, CAPILLARY     Status: Abnormal   Collection Time   10/22/11  4:07 PM      Component Value Range   Glucose-Capillary 332 (*) 70 - 99 (mg/dL)   Comment 1 Documented in Chart     Comment 2 Notify RN    GLUCOSE, CAPILLARY     Status: Abnormal   Collection Time   10/22/11  9:13 PM      Component Value Range   Glucose-Capillary 328 (*) 70 - 99 (mg/dL)  GLUCOSE, CAPILLARY     Status: Abnormal   Collection Time   10/22/11 11:51 PM      Component Value Range   Glucose-Capillary 288 (*) 70 - 99 (mg/dL)  GLUCOSE, CAPILLARY     Status: Abnormal   Collection Time   10/23/11  7:39 AM      Component Value Range   Glucose-Capillary 222 (*) 70 - 99 (mg/dL)  GLUCOSE, CAPILLARY     Status: Abnormal   Collection Time   10/23/11 11:45 AM      Component Value Range   Glucose-Capillary 265 (*) 70 - 99 (mg/dL)    Studies/Results: No results found.  Medications:    . aspirin  81 mg Oral Daily  .  budesonide  0.5 mg Nebulization BID  . feeding supplement  237 mL Oral TID WC  . fluticasone  2 spray Each Nare Daily  . furosemide  40 mg Oral Daily  . glimepiride  1 mg Oral Q breakfast  . indapamide  2.5 mg Oral Q0700  . insulin aspart  0-20 Units Subcutaneous TID WC  . insulin aspart  0-5 Units Subcutaneous QHS  . insulin aspart  5 Units Subcutaneous TID WC  . insulin aspart  8 Units Subcutaneous Once  . ipratropium  0.5 mg Nebulization QID  . levalbuterol  0.63 mg Nebulization Q6H  . methylPREDNISolone (SOLU-MEDROL) injection  40 mg Intravenous Q8H  . metoprolol tartrate  12.5 mg Oral BID  . moxifloxacin  400 mg Intravenous Q24H  . multivitamins ther. w/minerals  1 tablet Oral Daily  . NIFEdipine  30 mg Oral Daily  . NIFEdipine  30 mg Oral Once  . olmesartan  40 mg Oral BID  . pantoprazole  40 mg Oral Q1200  . pioglitazone  45 mg Oral Daily  . polysaccharide iron  150 mg Oral Daily  . potassium chloride  10 mEq Oral Once  . rosuvastatin  5 mg Oral Daily  . sodium chloride  3  mL Intravenous Q12H  . DISCONTD: albuterol  2.5 mg Nebulization QID  . DISCONTD: feeding supplement (GLUCERNA 1.2 CAL)  237 mL Oral TID WC  . DISCONTD: insulin aspart  0-9 Units Subcutaneous TID WC  . DISCONTD: insulin aspart  3 Units Subcutaneous TID WC  . DISCONTD: methylPREDNISolone (SOLU-MEDROL) injection  40 mg Intravenous Q12H  . DISCONTD: NIFEdipine  60 mg Oral Daily    acetaminophen, acetaminophen, benzonatate, guaiFENesin-dextromethorphan, nitroGLYCERIN, ondansetron (ZOFRAN) IV, ondansetron, DISCONTD: albuterol     Assessment/Plan: Acute infectious BRONCHITIS  Influenza screen negative - no focal infiltrate noted on CXR  No improvement noted white on nebs, Avelox and Solu-Medrol which I increased yesterday. Pulmonary service was consulted. Sinus tachycardia Possibly secondary to continuous coughing and nebulizer treatment I change albuterol to levalbuterol. Continue Robitussin for cough.  I also added metoprolol 12.5 mg twice a day. Check TSH level. Diabetes mellitus type 2 HBA1c of 6.7 . CBGS is elevated because of steroids. We will continue Glimpride And Actos , change SSI to resistant and increase NovoLog coverage to 5 units with meals.  HYPERCHOLESTEROLEMIA  Continue medical therapy  KNOWN THORACIC AORTIC ANEURYSM  has been followed on a serial basis via CT of chest - deemed to be stable per her last cardiology eval  METASTATIC Carcinoid tumor OF THE LUNGS  the patient was seen by Dr. Edwyna Shell earlier this month (09/28/11) at which time he felt that her pulmonary disease was stable - he plans to obtain a f/u CT in 6 months time  HYPERTENSION  Fair, continue BENICAR, decreased nifedipine XL 30 mg and add metoprolol 12.5 mg twice a day. Monitor and adjust as needed.    LOS: 3 days   Amere Iott 10/23/2011, 2:28 PM

## 2011-10-23 NOTE — Consult Note (Signed)
#  [id number] 

## 2011-10-24 LAB — TSH: TSH: 0.285 u[IU]/mL — ABNORMAL LOW (ref 0.350–4.500)

## 2011-10-24 LAB — BASIC METABOLIC PANEL
CO2: 32 mEq/L (ref 19–32)
Calcium: 8.8 mg/dL (ref 8.4–10.5)
Creatinine, Ser: 1.16 mg/dL — ABNORMAL HIGH (ref 0.50–1.10)
GFR calc Af Amer: 51 mL/min — ABNORMAL LOW (ref >90)
GFR calc non Af Amer: 44 mL/min — ABNORMAL LOW (ref >90)

## 2011-10-24 LAB — CBC
MCH: 29.8 pg (ref 26.0–34.0)
MCHC: 32.6 g/dL (ref 30.0–36.0)
MCV: 91.5 fL (ref 78.0–100.0)
Platelets: 272 10*3/uL (ref 150–400)
RDW: 13.5 % (ref 11.5–15.5)

## 2011-10-24 LAB — GLUCOSE, CAPILLARY
Glucose-Capillary: 139 mg/dL — ABNORMAL HIGH (ref 70–99)
Glucose-Capillary: 153 mg/dL — ABNORMAL HIGH (ref 70–99)

## 2011-10-24 MED ORDER — NIFEDIPINE ER 60 MG PO TB24
60.0000 mg | ORAL_TABLET | Freq: Every day | ORAL | Status: DC
  Administered 2011-10-24 – 2011-10-30 (×7): 60 mg via ORAL
  Filled 2011-10-24 (×8): qty 1

## 2011-10-24 MED ORDER — POTASSIUM CHLORIDE CRYS ER 20 MEQ PO TBCR
40.0000 meq | EXTENDED_RELEASE_TABLET | ORAL | Status: AC
  Administered 2011-10-24 (×2): 40 meq via ORAL
  Filled 2011-10-24 (×2): qty 2

## 2011-10-24 NOTE — Consult Note (Signed)
[name], CANNY NO.:  [id number]  MEDICAL RECORD NO.:  [id number]  LOCATION:  3731                         FACILITY:  [facility]  PHYSICIAN:  Barbaraann Share, MD,FCCPDATE OF BIRTH:  1934[birth year]03-23  DATE OF CONSULTATION:  10/23/2011 DATE OF DISCHARGE:                                CONSULTATION   HISTORY OF PRESENT ILLNESS:  The patient is a 77 year old female, who I have been asked to see for dyspnea.  The patient has a history of metastatic carcinoid tumor to the lung that is quite extensive, with her most recent CT the beginning of this month shown increased in number and size of her nodules.  She also has a known ascending aortic aneurysm, and a history of aortic insufficiency by echo in 2009.  It is really unclear if she has had a recent echo.  She has chronic dyspnea on exertion, and comes in with a 2-week history of worsening shortness of breath.  The patient speaks very little English, and there is no family members to interpret.  Communication is very difficult.  According to the admission note, she also had significant dry hacking cough but no mucous production.  She was treated with antibiotics by her primary physician prior to admission and did not see improvement.  The patient's chest x-ray on admission showed increased markings at the bases, but no pneumonic infiltrate or obvious source for her worsening shortness of breath.  It should also be noted that her brain natriuretic peptide was very elevated at the time of admission as well.  The patient carries a diagnosis of "COPD," but has never smoked.  As best I can tell from talking with her, she has never had pulmonary function studies.  She has been treated with bronchodilators as well as IV steroids since being in the hospital, with no change in her feeling of shortness of breath.  The patient also has excellent oxygen saturations on room air.  PAST MEDICAL HISTORY:  Significant for her 1.  Metastatic carcinoid. 2. History of diabetes mellitus. 3. History of hypertension. 4. History of aortic insufficiency. 5. History of aortic aneurysm. 6. History of hypercholesterolemia. 7. History of GERD. 8. History of endobronchial involvement with carcinoid, requiring     laser bronchoscopy for airway obstruction.  FAMILY HISTORY:  Unable to be obtained from the patient.  SOCIAL HISTORY:  She has never smoked and does not drink alcohol.  She has no known drug allergies.  REVIEW OF SYSTEMS:  Extremely difficult to obtain due to the language barrier.  PHYSICAL EXAMINATION:  GENERAL:  She is an obese female, in no acute distress. HEENT:  Pupils equal, round, reactive to light, accommodation. Extraocular muscles are intact.  Nares patent without discharge. Oropharynx is clear. NECK:  Supple without jugular venous distention or lymphadenopathies. No palpable thyromegaly. CHEST:  Reveals minimal basilar crackles, but totally clear lung fields otherwise.  There is excellent airflow and no wheezing noted.  There is very mild upper airway pseudo wheezing noted. CARDIAC:  Reveals a regular rate and rhythm with no murmurs, rubs or gallops. ABDOMEN:  Soft, nontender, with good bowel sounds. GENITAL:  Not done and not indicated. RECTAL:  Not done and not indicated. BREASTS:  Not done and not indicated. LOWER EXTREMITIES:  Showed 2+ edema with varicosities.  Pulses are decreased distally but present.  There is no calf tenderness.  No cyanosis noted. NEUROLOGIC:  She is alert and oriented and moves all 4 extremities.  IMPRESSION:  Dyspnea on exertion.  The patient has a history of chronic dyspnea on exertion, but feels this is much worse than her usual baseline.  Despite this, the patient has no increased work of breathing at rest, and her O2 saturations on room air are 95%-99%.  Although she has been given a diagnosis of "chronic obstructive pulmonary disease," she has never smoked,  and she has totally clear lung fields with excellent airflow.  I would find it very unlikely that she does have chronic obstructive pulmonary disease, and therefore would discontinue her aggressive bronchodilators and steroids until we can document this. Possible sources of her increased dyspnea on exertion can include increased tumor bulk in her lung parenchyma, especially since her CT scan earlier in the month showed increase in number and size of nodules present.  I would also wonder whether she could have endobronchial obstruction as she has had in the past that may be leading to reduced air flow.  Finally, the patient does have a history of aortic insufficiency, however, is unclear to me how bad this is at the moment. She does have increased lung markings on chest x-ray and an increased brain natriuretic peptide, and therefore may have an element of congestive heart failure as well.  All of this needs to be considered.  SUGGESTIONS: 1. Would discontinue scheduled bronchodilators as well as steroids     since they do not seemed to be helping, and there is no evidence     for a "COPD exacerbation." 2. Would consider a trial of diuresis to see if this improves. 3. We will schedule for spirometry at the beginning of the week, and     this has been ordered.  If there is no airflow obstruction noted,     would consider whether it is worth doing a CT angio along with high-     resolution cuts to re-evaluate her lung fields. 4. Could consider consulting Dr. Karle Plumber for possible     bronchoscopy to evaluate her airways for endobronchial obstruction. 5. The patient obviously has end-stage metastatic disease, and would     strongly consider a do not resuscitate status and possibly     involving hospice if her dyspnea worsens.     Barbaraann Share, MD,FCCP     [facility]  D:  10/23/2011  T:  10/24/2011  Job:  [id number]  cc:   [facility]

## 2011-10-24 NOTE — Progress Notes (Signed)
Subjective: Patient seen and examined ,reported some improvement in cough and SOB  Objective: Vital signs in last 24 hours: Temp:  [98 F (36.7 C)-98.5 F (36.9 C)] 98.5 F (36.9 C) (12/30 0600) Pulse Rate:  [75-102] 102  (12/30 0600) Resp:  [16-20] 16  (12/30 0600) BP: (136-153)/(66-76) 136/66 mmHg (12/30 0600) SpO2:  [90 %-99 %] 90 % (12/30 0600) Weight change:  Last BM Date: 10/23/11  Intake/Output from previous day:       Physical Exam: General: Alert, awake, oriented x3, in no acute distress. HEENT: No bruits, no goiter. Heart: Regular rate and rhythm, without murmurs, rubs, gallops. Lungs: Clear to auscultation bilaterally  Abdomen: Soft, nontender, nondistended, positive bowel sounds. Extremities: No clubbing cyanosis or edema with positive pedal pulses. Neuro: Grossly intact, nonfocal.    Lab Results: Results for orders placed during the hospital encounter of 10/20/11 (from the past 24 hour(s))  GLUCOSE, CAPILLARY     Status: Abnormal   Collection Time   10/23/11 11:45 AM      Component Value Range   Glucose-Capillary 265 (*) 70 - 99 (mg/dL)  GLUCOSE, CAPILLARY     Status: Abnormal   Collection Time   10/23/11  5:18 PM      Component Value Range   Glucose-Capillary 241 (*) 70 - 99 (mg/dL)  GLUCOSE, CAPILLARY     Status: Abnormal   Collection Time   10/23/11  9:23 PM      Component Value Range   Glucose-Capillary 148 (*) 70 - 99 (mg/dL)  CBC     Status: Abnormal   Collection Time   10/24/11  5:37 AM      Component Value Range   WBC 10.8 (*) 4.0 - 10.5 (K/uL)   RBC 3.89  3.87 - 5.11 (MIL/uL)   Hemoglobin 11.6 (*) 12.0 - 15.0 (g/dL)   HCT 21.3 (*) 08.6 - 46.0 (%)   MCV 91.5  78.0 - 100.0 (fL)   MCH 29.8  26.0 - 34.0 (pg)   MCHC 32.6  30.0 - 36.0 (g/dL)   RDW 57.8  46.9 - 62.9 (%)   Platelets 272  150 - 400 (K/uL)  BASIC METABOLIC PANEL     Status: Abnormal   Collection Time   10/24/11  5:37 AM      Component Value Range   Sodium 136  135 - 145  (mEq/L)   Potassium 3.2 (*) 3.5 - 5.1 (mEq/L)   Chloride 96  96 - 112 (mEq/L)   CO2 32  19 - 32 (mEq/L)   Glucose, Bld 279 (*) 70 - 99 (mg/dL)   BUN 52 (*) 6 - 23 (mg/dL)   Creatinine, Ser 5.28 (*) 0.50 - 1.10 (mg/dL)   Calcium 8.8  8.4 - 41.3 (mg/dL)   GFR calc non Af Amer 44 (*) >90 (mL/min)   GFR calc Af Amer 51 (*) >90 (mL/min)  GLUCOSE, CAPILLARY     Status: Abnormal   Collection Time   10/24/11  8:17 AM      Component Value Range   Glucose-Capillary 209 (*) 70 - 99 (mg/dL)    Studies/Results: No results found.  Medications:    . aspirin  81 mg Oral Daily  . chlorpheniramine-HYDROcodone  5 mL Oral Q12H  . feeding supplement  237 mL Oral TID WC  . fluticasone  2 spray Each Nare Daily  . furosemide  40 mg Oral Daily  . glimepiride  1 mg Oral Q breakfast  . indapamide  2.5 mg Oral Q0700  .  insulin aspart  0-20 Units Subcutaneous TID WC  . insulin aspart  0-5 Units Subcutaneous QHS  . insulin aspart  5 Units Subcutaneous TID WC  . metoprolol tartrate  12.5 mg Oral BID  . moxifloxacin  400 mg Intravenous Q24H  . multivitamins ther. w/minerals  1 tablet Oral Daily  . NIFEdipine  30 mg Oral Daily  . NIFEdipine  30 mg Oral Once  . olmesartan  40 mg Oral BID  . pantoprazole  40 mg Oral Q1200  . pioglitazone  45 mg Oral Daily  . polysaccharide iron  150 mg Oral Daily  . potassium chloride  10 mEq Oral Once  . rosuvastatin  5 mg Oral Daily  . sodium chloride  3 mL Intravenous Q12H  . DISCONTD: budesonide  0.5 mg Nebulization BID  . DISCONTD: feeding supplement (GLUCERNA 1.2 CAL)  237 mL Oral TID WC  . DISCONTD: insulin aspart  0-9 Units Subcutaneous TID WC  . DISCONTD: insulin aspart  3 Units Subcutaneous TID WC  . DISCONTD: ipratropium  0.5 mg Nebulization QID  . DISCONTD: levalbuterol  0.63 mg Nebulization Q6H  . DISCONTD: levalbuterol  0.63 mg Nebulization QID  . DISCONTD: methylPREDNISolone (SOLU-MEDROL) injection  40 mg Intravenous Q8H  . DISCONTD: NIFEdipine  60  mg Oral Daily    acetaminophen, acetaminophen, benzonatate, guaiFENesin-dextromethorphan, levalbuterol, nitroGLYCERIN, ondansetron (ZOFRAN) IV, ondansetron, DISCONTD: albuterol     Assessment/Plan: Dyspnea  Appreciate pulmonary service input  contiue lasix and avelox  Plans for spirometry noted  Sinus tachycardia  Improved  Possibly secondary to continuous coughing and nebulizer treatment  TSH pending  Diabetes mellitus type 2  HBA1c of 6.7 . CBGS improved with discontinuation  of steroids. We will continue Glimpride And Actos ,  SSI to resistant and NovoLog coverage to 5 units with meals.  AKI Rise in creatinine possibly secondary to diuresis ,will hold benicar ,continue lasix and monitor ,replete K  HYPERCHOLESTEROLEMIA  Continue medical therapy  KNOWN THORACIC AORTIC ANEURYSM  has been followed on a serial basis via CT of chest - deemed to be stable per her last cardiology eval  METASTATIC Carcinoid tumor OF THE LUNGS  the patient was seen by Dr. Edwyna Shell earlier this month (09/28/11) at which time he felt that her pulmonary disease was stable - he plans to obtain a f/u CT in 6 months time  HYPERTENSION  Fair, continue  nifedipine XL  and metoprolol ,hold benicar . Monitor and adjust as needed.       LOS: 4 days   [name] 10/24/2011, 8:59 AM

## 2011-10-25 ENCOUNTER — Inpatient Hospital Stay (HOSPITAL_COMMUNITY): Payer: Medicare Other

## 2011-10-25 LAB — GLUCOSE, CAPILLARY: Glucose-Capillary: 259 mg/dL — ABNORMAL HIGH (ref 70–99)

## 2011-10-25 LAB — BASIC METABOLIC PANEL
CO2: 32 mEq/L (ref 19–32)
Chloride: 98 mEq/L (ref 96–112)
Creatinine, Ser: 1.04 mg/dL (ref 0.50–1.10)
Potassium: 3.2 mEq/L — ABNORMAL LOW (ref 3.5–5.1)

## 2011-10-25 LAB — MAGNESIUM: Magnesium: 1.8 mg/dL (ref 1.5–2.5)

## 2011-10-25 MED ORDER — INSULIN ASPART 100 UNIT/ML ~~LOC~~ SOLN
0.0000 [IU] | Freq: Every day | SUBCUTANEOUS | Status: DC
  Administered 2011-10-27 – 2011-10-29 (×3): 2 [IU] via SUBCUTANEOUS

## 2011-10-25 MED ORDER — INSULIN ASPART 100 UNIT/ML ~~LOC~~ SOLN
0.0000 [IU] | Freq: Three times a day (TID) | SUBCUTANEOUS | Status: DC
  Administered 2011-10-25: 3 [IU] via SUBCUTANEOUS
  Administered 2011-10-25: 8 [IU] via SUBCUTANEOUS
  Administered 2011-10-26: 3 [IU] via SUBCUTANEOUS
  Administered 2011-10-26: 2 [IU] via SUBCUTANEOUS
  Administered 2011-10-26: 3 [IU] via SUBCUTANEOUS
  Administered 2011-10-27 (×2): 5 [IU] via SUBCUTANEOUS
  Administered 2011-10-28: 3 [IU] via SUBCUTANEOUS
  Administered 2011-10-28: 5 [IU] via SUBCUTANEOUS
  Administered 2011-10-28: 8 [IU] via SUBCUTANEOUS
  Administered 2011-10-29: 3 [IU] via SUBCUTANEOUS
  Administered 2011-10-29: 11 [IU] via SUBCUTANEOUS
  Administered 2011-10-30: 8 [IU] via SUBCUTANEOUS

## 2011-10-25 MED ORDER — POTASSIUM CHLORIDE CRYS ER 20 MEQ PO TBCR
40.0000 meq | EXTENDED_RELEASE_TABLET | ORAL | Status: AC
  Administered 2011-10-25 (×2): 40 meq via ORAL
  Filled 2011-10-25: qty 2

## 2011-10-25 MED ORDER — DM-GUAIFENESIN ER 30-600 MG PO TB12
2.0000 | ORAL_TABLET | Freq: Two times a day (BID) | ORAL | Status: DC
  Administered 2011-10-25 – 2011-10-26 (×2): 2 via ORAL
  Filled 2011-10-25 (×3): qty 2

## 2011-10-25 MED ORDER — POTASSIUM CHLORIDE CRYS ER 20 MEQ PO TBCR
EXTENDED_RELEASE_TABLET | ORAL | Status: AC
  Filled 2011-10-25: qty 2

## 2011-10-25 MED ORDER — PANTOPRAZOLE SODIUM 40 MG PO TBEC
40.0000 mg | DELAYED_RELEASE_TABLET | Freq: Two times a day (BID) | ORAL | Status: DC
  Administered 2011-10-26 – 2011-10-30 (×9): 40 mg via ORAL
  Filled 2011-10-25 (×9): qty 1

## 2011-10-25 NOTE — Progress Notes (Addendum)
Name: BEAUTY PLESS MRN: [id number] DOB: [birth year]    LOS: 5  PCCM ADMISSION NOTE  History of Present Illness: 77 yo latvian female never smoker  speaks very little english admit 12/26 with cough and sob x 3 weeks pta seen by pulmonary 12/29 for failure to improve on treatment for copd/ab   Micro:   Antibiotics: Avelox (? Bronchitis) 12/26 >>>  Tests / Events: Spirometry orderd 12/30>>>      Vital Signs: Temp:  [98 F (36.7 C)-98.1 F (36.7 C)] 98 F (36.7 C) (12/31 1342) Pulse Rate:  [59-70] 61  (12/31 1342) Resp:  [18-22] 20  (12/31 1342) BP: (100-138)/(64-87) 138/87 mmHg (12/31 1342) SpO2:  [92 %-94 %] 92 % (12/31 1342)    Physical Examination:  Elderly wf nad, congested cough HEENT unremarkable Lungs with insp/ exp rhonchi bilaterally RRR no s3  abd soft Ext warm without calf tenderness / edema    Labs and Imaging:    Lab 10/25/11 0500 10/24/11 0537 10/21/11 0630  NA 138 136 140  K 3.2* 3.2* 3.8  CL 98 96 100  CO2 32 32 25  BUN 56* 52* 20  CREATININE 1.04 1.16* 0.68  GLUCOSE 129* 279* 265*    Lab 10/24/11 0537 10/21/11 0630 10/20/11 1807  HGB 11.6* 12.6 12.9  HCT 35.6* 38.2 37.6  WBC 10.8* 8.0 8.9  PLT 272 285 260   Assessment and Plan:  Refractory cough  - bilateral nodules ? Met carcinoid  - Sinus ct ordered 12/31  - Max gerd rx started 12/31    Sandrea Hughs, MD Pulmonary and Critical Care Medicine [facility] Cell [phone]

## 2011-10-25 NOTE — Progress Notes (Signed)
Subjective: Patient seen and examined ,still c/o cough and congestion with exertional SOB.  Objective: Vital signs in last 24 hours: Temp:  [98 F (36.7 C)-98.1 F (36.7 C)] 98.1 F (36.7 C) (12/31 0530) Pulse Rate:  [59-70] 59  (12/31 0530) Resp:  [18-22] 22  (12/31 0530) BP: (100-133)/(64-68) 133/68 mmHg (12/31 0530) SpO2:  [92 %-94 %] 94 % (12/31 0530) Weight change:  Last BM Date: 10/23/11  Intake/Output from previous day:       Physical Exam: General: Alert, awake, oriented x3, in no acute distress. HEENT: No bruits, no goiter. Heart: Regular rate and rhythm, without murmurs, rubs, gallops. Lungs: Coarse BS ,Few rales  Abdomen: Soft, nontender, nondistended, positive bowel sounds. Extremities: No clubbing cyanosis or edema with positive pedal pulses. Neuro: Grossly intact, nonfocal.    Lab Results: Results for orders placed during the hospital encounter of 10/20/11 (from the past 24 hour(s))  GLUCOSE, CAPILLARY     Status: Normal   Collection Time   10/24/11 11:49 AM      Component Value Range   Glucose-Capillary 95  70 - 99 (mg/dL)  GLUCOSE, CAPILLARY     Status: Abnormal   Collection Time   10/24/11  4:22 PM      Component Value Range   Glucose-Capillary 139 (*) 70 - 99 (mg/dL)  GLUCOSE, CAPILLARY     Status: Abnormal   Collection Time   10/24/11  9:04 PM      Component Value Range   Glucose-Capillary 153 (*) 70 - 99 (mg/dL)  BASIC METABOLIC PANEL     Status: Abnormal   Collection Time   10/25/11  5:00 AM      Component Value Range   Sodium 138  135 - 145 (mEq/L)   Potassium 3.2 (*) 3.5 - 5.1 (mEq/L)   Chloride 98  96 - 112 (mEq/L)   CO2 32  19 - 32 (mEq/L)   Glucose, Bld 129 (*) 70 - 99 (mg/dL)   BUN 56 (*) 6 - 23 (mg/dL)   Creatinine, Ser 1.61  0.50 - 1.10 (mg/dL)   Calcium 8.8  8.4 - 09.6 (mg/dL)   GFR calc non Af Amer 50 (*) >90 (mL/min)   GFR calc Af Amer 59 (*) >90 (mL/min)  GLUCOSE, CAPILLARY     Status: Abnormal   Collection Time   10/25/11  7:36 AM      Component Value Range   Glucose-Capillary 136 (*) 70 - 99 (mg/dL)   Comment 1 Notify RN      Studies/Results: No results found.  Medications:    . aspirin  81 mg Oral Daily  . chlorpheniramine-HYDROcodone  5 mL Oral Q12H  . feeding supplement  237 mL Oral TID WC  . fluticasone  2 spray Each Nare Daily  . furosemide  40 mg Oral Daily  . glimepiride  1 mg Oral Q breakfast  . indapamide  2.5 mg Oral Q0700  . insulin aspart  0-20 Units Subcutaneous TID WC  . insulin aspart  0-5 Units Subcutaneous QHS  . insulin aspart  5 Units Subcutaneous TID WC  . metoprolol tartrate  12.5 mg Oral BID  . moxifloxacin  400 mg Intravenous Q24H  . multivitamins ther. w/minerals  1 tablet Oral Daily  . NIFEdipine  60 mg Oral Daily  . pantoprazole  40 mg Oral Q1200  . pioglitazone  45 mg Oral Daily  . polysaccharide iron  150 mg Oral Daily  . potassium chloride  10 mEq Oral Once  .  potassium chloride SA      . potassium chloride  40 mEq Oral Q4H  . potassium chloride  40 mEq Oral Q4H  . rosuvastatin  5 mg Oral Daily  . sodium chloride  3 mL Intravenous Q12H    acetaminophen, acetaminophen, benzonatate, guaiFENesin-dextromethorphan, levalbuterol, nitroGLYCERIN, ondansetron (ZOFRAN) IV, ondansetron     Assessment/Plan: Dyspnea  Not improving  contiue lasix and avelox  Plans for CTA and  spirometry noted ,? Inpatient vs outpatient ,will defer to pulmonary service  Sinus tachycardia  Resolved  TSH noted to be low ,will check free T4  Diabetes mellitus type 2  HBA1c of 6.7 . CBGS  Now controlled  . We will continue Glimpride And Actos , change SSI to moderate  and  Discontinue NovoLog  Meal coverage AKI  Resolving  ,benicar on hold ,continue lasix and monitor ,replete K  HYPERCHOLESTEROLEMIA  Continue medical therapy  KNOWN THORACIC AORTIC ANEURYSM  has been followed on a serial basis via CT of chest - deemed to be stable per her last cardiology eval  METASTATIC  Carcinoid tumor OF THE LUNGS  the patient was seen by Dr. Edwyna Shell earlier this month (09/28/11) at which time he felt that her pulmonary disease was stable - he plans to obtain a f/u CT in 6 months time  HYPERTENSION  Controlled , continue nifedipine XL and metoprolol . Monitor and adjust as needed.  Disposition: Will d/c  Home when cleared by pulmonary service          LOS: 5 days   Duke Weisensel 10/25/2011, 9:37 AM

## 2011-10-26 ENCOUNTER — Inpatient Hospital Stay (HOSPITAL_COMMUNITY): Payer: Medicare Other

## 2011-10-26 DIAGNOSIS — C7A090 Malignant carcinoid tumor of the bronchus and lung: Secondary | ICD-10-CM

## 2011-10-26 DIAGNOSIS — J441 Chronic obstructive pulmonary disease with (acute) exacerbation: Secondary | ICD-10-CM

## 2011-10-26 DIAGNOSIS — R0602 Shortness of breath: Secondary | ICD-10-CM

## 2011-10-26 DIAGNOSIS — E1165 Type 2 diabetes mellitus with hyperglycemia: Secondary | ICD-10-CM

## 2011-10-26 LAB — GLUCOSE, CAPILLARY
Glucose-Capillary: 157 mg/dL — ABNORMAL HIGH (ref 70–99)
Glucose-Capillary: 198 mg/dL — ABNORMAL HIGH (ref 70–99)
Glucose-Capillary: 181 mg/dL — ABNORMAL HIGH (ref 70–99)

## 2011-10-26 LAB — BASIC METABOLIC PANEL
BUN: 32 mg/dL — ABNORMAL HIGH (ref 6–23)
Calcium: 8.8 mg/dL (ref 8.4–10.5)
Creatinine, Ser: 0.91 mg/dL (ref 0.50–1.10)
GFR calc Af Amer: 69 mL/min — ABNORMAL LOW (ref >90)
GFR calc non Af Amer: 59 mL/min — ABNORMAL LOW (ref >90)
Potassium: 3.8 mEq/L (ref 3.5–5.1)

## 2011-10-26 MED ORDER — GUAIFENESIN ER 600 MG PO TB12
1200.0000 mg | ORAL_TABLET | Freq: Two times a day (BID) | ORAL | Status: DC
  Administered 2011-10-26 – 2011-10-30 (×9): 1200 mg via ORAL
  Filled 2011-10-26 (×12): qty 2

## 2011-10-26 MED ORDER — LEVALBUTEROL HCL 0.63 MG/3ML IN NEBU
0.6300 mg | INHALATION_SOLUTION | Freq: Four times a day (QID) | RESPIRATORY_TRACT | Status: DC
  Administered 2011-10-26 – 2011-10-30 (×15): 0.63 mg via RESPIRATORY_TRACT
  Filled 2011-10-26 (×20): qty 3

## 2011-10-26 NOTE — Progress Notes (Signed)
Name: KAZUE CERRO MRN: [id number] DOB: Jul 30, 1932    LOS: 6  PCCM Progress NOTE  History of Present Illness: 78 yo latvian female never smoker  speaks very little english admit 12/26 with cough and sob x 3 weeks pta seen by pulmonary 12/29 for failure to improve on treatment for copd/ab   Micro:   Antibiotics: Avelox (? Bronchitis) 12/26 >>>  Tests / Events: Spirometry orderd 12/30>>>      Vital Signs: Temp:  [97.4 F (36.3 C)-98.6 F (37 C)] 98.1 F (36.7 C) (01/01 0511) Pulse Rate:  [61-83] 83  (01/01 0650) Resp:  [20] 20  (01/01 0511) BP: (100-146)/(62-87) 110/82 mmHg (01/01 0650) SpO2:  [91 %-95 %] 91 % (01/01 0511) I/O last 3 completed shifts: In: 1383 [P.O.:1380; I.V.:3] Out: -   Physical Examination:  Elderly wf nad, congested cough HEENT unremarkable Lungs with insp/ exp rhonchi bilaterally RRR no s3  abd soft Ext warm without calf tenderness / edema      . aspirin  81 mg Oral Daily  . chlorpheniramine-HYDROcodone  5 mL Oral Q12H  . dextromethorphan-guaiFENesin  2 tablet Oral BID  . feeding supplement  237 mL Oral TID WC  . fluticasone  2 spray Each Nare Daily  . furosemide  40 mg Oral Daily  . glimepiride  1 mg Oral Q breakfast  . indapamide  2.5 mg Oral Q0700  . insulin aspart  0-15 Units Subcutaneous TID WC  . insulin aspart  0-5 Units Subcutaneous QHS  . metoprolol tartrate  12.5 mg Oral BID  . moxifloxacin  400 mg Intravenous Q24H  . multivitamins ther. w/minerals  1 tablet Oral Daily  . NIFEdipine  60 mg Oral Daily  . pantoprazole  40 mg Oral BID AC  . pioglitazone  45 mg Oral Daily  . polysaccharide iron  150 mg Oral Daily  . potassium chloride  10 mEq Oral Once  . potassium chloride SA      . potassium chloride  40 mEq Oral Q4H  . rosuvastatin  5 mg Oral Daily  . sodium chloride  3 mL Intravenous Q12H  . DISCONTD: pantoprazole  40 mg Oral Q1200    Labs and Imaging:    Lab 10/26/11 0635 10/25/11 0500 10/24/11 0537  NA 141 138  136  K 3.8 3.2* 3.2*  CL 98 98 96  CO2 35* 32 32  BUN 32* 56* 52*  CREATININE 0.91 1.04 1.16*  GLUCOSE 155* 129* 279*    Lab 10/24/11 0537 10/21/11 0630 10/20/11 1807  HGB 11.6* 12.6 12.9  HCT 35.6* 38.2 37.6  WBC 10.8* 8.0 8.9  PLT 272 285 260   IMAGING:   12/26>> CXR: IMPRESSION:  Stable cardiomegaly and mild bibasilar scarring. No active  disease.  12/31>> CT Sinuses; IMPRESSION:  1. Chronic left maxillary sinus disease with minimal mucosal  thickening.  2. No evidence for acute disease.  3. Mild cerebral atrophy and white matter disease.  1/1>> CXR: ==> report pending Looks like Lingular opacity.   Assessment and Plan:  ?Lingular opacity, r/o pneumonia>> Refractory cough>> --on IV Avelox> continue --on Xopenex nebs as needed> change to Q6H --on Tussionex as needed --on Mucinex 1200mg  Bid + Fluids --add IS & Flutter   Alberto Pina M. Kriste Basque MD 10/26/11 11:00AM

## 2011-10-26 NOTE — Progress Notes (Signed)
Subjective: 78 year-old female with a history of metastatic carcinoid tumor, diabetes mellitus type 2, ascending aortic aneurysm (last measured was 5 cm and is being followed with Dr. Eden Emms cardiology) who presented with on going shortness of breath over the last  3weeks PTA  with subjective feeling of fever chills cough.Influenza screen negative - no focal infiltrate noted on CXR   She was initially observed in SDU ,stabilized and transitioned to the floor ,she was treated aggressively with BD,solumedrol and antibiotics with no improvement ,pulmonary service was consulted and now following. Patient seen and examined ,continued to have spells of hacking dry cough and SOB .  Objective: Vital signs in last 24 hours: Temp:  [97.4 F (36.3 C)-98.6 F (37 C)] 98.1 F (36.7 C) (01/01 0511) Pulse Rate:  [61-83] 83  (01/01 0650) Resp:  [20] 20  (01/01 0511) BP: (100-146)/(62-87) 140/68 mmHg (01/01 1047) SpO2:  [91 %-95 %] 91 % (01/01 0511) Weight change:  Last BM Date: 10/25/11  Intake/Output from previous day: 12/31 0701 - 01/01 0700 In: 1383 [P.O.:1380; I.V.:3] Out: -  Total I/O In: 400 [P.O.:400] Out: -    Physical Exam: General: Alert, awake, oriented x3, in mild  acute distress. Heart: Regular rate and rhythm, without murmurs, rubs, gallops. Lungs: scattered rhonchi bilaterally . Abdomen: Soft, nontender, nondistended, positive bowel sounds. Extremities: No clubbing cyanosis or edema with positive pedal pulses. Neuro: Grossly intact, nonfocal.    Lab Results: Results for orders placed during the hospital encounter of 10/20/11 (from the past 24 hour(s))  GLUCOSE, CAPILLARY     Status: Abnormal   Collection Time   10/25/11  4:50 PM      Component Value Range   Glucose-Capillary 184 (*) 70 - 99 (mg/dL)   Comment 1 Notify RN    GLUCOSE, CAPILLARY     Status: Abnormal   Collection Time   10/25/11  9:52 PM      Component Value Range   Glucose-Capillary 189 (*) 70 - 99 (mg/dL)    Comment 1 Notify RN    BASIC METABOLIC PANEL     Status: Abnormal   Collection Time   10/26/11  6:35 AM      Component Value Range   Sodium 141  135 - 145 (mEq/L)   Potassium 3.8  3.5 - 5.1 (mEq/L)   Chloride 98  96 - 112 (mEq/L)   CO2 35 (*) 19 - 32 (mEq/L)   Glucose, Bld 155 (*) 70 - 99 (mg/dL)   BUN 32 (*) 6 - 23 (mg/dL)   Creatinine, Ser 1.61  0.50 - 1.10 (mg/dL)   Calcium 8.8  8.4 - 09.6 (mg/dL)   GFR calc non Af Amer 59 (*) >90 (mL/min)   GFR calc Af Amer 69 (*) >90 (mL/min)  GLUCOSE, CAPILLARY     Status: Abnormal   Collection Time   10/26/11  7:32 AM      Component Value Range   Glucose-Capillary 157 (*) 70 - 99 (mg/dL)  GLUCOSE, CAPILLARY     Status: Abnormal   Collection Time   10/26/11 11:47 AM      Component Value Range   Glucose-Capillary 198 (*) 70 - 99 (mg/dL)    Studies/Results: Dg Chest 2 View  10/26/2011  *RADIOLOGY REPORT*  Clinical Data: Cough and congestion.  Shortness of breath.  CHEST - 2 VIEW  Comparison: Two-view chest 10/20/2011.  Findings: Mild cardiomegaly persists.  Mild bibasilar atelectasis is similar to the prior study.  There is slight to increase  in a mild interstitial pattern, raising the possibility of mild edema. Atherosclerotic calcifications are noted.  IMPRESSION:  1.  Stable cardiomegaly with slight increase in interstitial prominence, suggesting the possibility of edema. 2.  Minimal bibasilar airspace disease likely reflects atelectasis.  Original Report Authenticated By: Jamesetta Orleans. MATTERN, M.D.   Ct Maxillofacial  Ltd Wo Cm  10/25/2011  *RADIOLOGY REPORT*  Clinical Data:  Chronic cough.  Metastatic carcinoid to the lung.  CT LIMITED SINUSES WITHOUT CONTRAST  Technique:  Multidetector CT images of the paranasal sinuses were obtained in a single plane without contrast.  Comparison:  None available.  Findings:  Minimal mucosal thickening is present in the left maxillary sinus.  The left maxillary sinus appears smaller than the right ,  compatible with a shrunken sinus and chronic disease.  No other significant mucosal disease is evident.  The mastoid air cells are clear bilaterally.  Limited imaging of the soft tissues of the neck reveal atherosclerotic calcifications at the carotid bifurcations. Limited imaging of the brain demonstrates mild atrophy white matter disease.  IMPRESSION:  1.  Chronic left maxillary sinus disease with minimal mucosal thickening. 2.  No evidence for acute disease. 3.  Mild cerebral atrophy and white matter disease.  Original Report Authenticated By: Jamesetta Orleans. MATTERN, M.D.    Medications:    . aspirin  81 mg Oral Daily  . chlorpheniramine-HYDROcodone  5 mL Oral Q12H  . feeding supplement  237 mL Oral TID WC  . fluticasone  2 spray Each Nare Daily  . furosemide  40 mg Oral Daily  . glimepiride  1 mg Oral Q breakfast  . guaiFENesin  1,200 mg Oral BID  . indapamide  2.5 mg Oral Q0700  . insulin aspart  0-15 Units Subcutaneous TID WC  . insulin aspart  0-5 Units Subcutaneous QHS  . levalbuterol  0.63 mg Nebulization Q6H  . metoprolol tartrate  12.5 mg Oral BID  . moxifloxacin  400 mg Intravenous Q24H  . multivitamins ther. w/minerals  1 tablet Oral Daily  . NIFEdipine  60 mg Oral Daily  . pantoprazole  40 mg Oral BID AC  . pioglitazone  45 mg Oral Daily  . polysaccharide iron  150 mg Oral Daily  . potassium chloride  10 mEq Oral Once  . potassium chloride SA      . potassium chloride  40 mEq Oral Q4H  . rosuvastatin  5 mg Oral Daily  . sodium chloride  3 mL Intravenous Q12H  . DISCONTD: dextromethorphan-guaiFENesin  2 tablet Oral BID  . DISCONTD: pantoprazole  40 mg Oral Q1200    acetaminophen, acetaminophen, benzonatate, nitroGLYCERIN, ondansetron (ZOFRAN) IV, ondansetron, DISCONTD: guaiFENesin-dextromethorphan, DISCONTD: levalbuterol     Assessment/Plan:  Dyspnea  Not improving  continue BD , mucolytic,lasix and avelox .IS&Flutter added today by Dr Kriste Basque. Plans for CTA and  spirometry noted .furtherrecommendations as per pulmonary service . Sinus tachycardia  Resolved ,aluterol was switched to levalbuterol  TSH noted to be low , free T4 WNL  Diabetes mellitus type 2  HBA1c of 6.7 . CBGS Now better controlled . We will continue Glimpride And Actos , SSI moderate scale ,monitor CBGs and adjust . AKI  Resolving ,benicar on hold ,continue lasix and monitor . HYPERCHOLESTEROLEMIA  Continue medical therapy  KNOWN THORACIC AORTIC ANEURYSM  has been followed on a serial basis via CT of chest - deemed to be stable per her last cardiology eval  METASTATIC Carcinoid tumor OF THE LUNGS  the patient was seen  by Dr. Edwyna Shell earlier this month (09/28/11) at which time he felt that her pulmonary disease was stable - he plans to obtain a f/u CT in 6 months time  HYPERTENSION  Controlled , continue nifedipine XL and metoprolol . Monitor and adjust as needed.  Disposition:  Will d/c Home when cleared by pulmonary service          LOS: 6 days   Mikaella Escalona 10/26/2011, 1:39 PM

## 2011-10-27 LAB — CBC
MCH: 30.1 pg (ref 26.0–34.0)
MCHC: 32.7 g/dL (ref 30.0–36.0)
MCV: 92.1 fL (ref 78.0–100.0)
Platelets: 258 10*3/uL (ref 150–400)

## 2011-10-27 LAB — BASIC METABOLIC PANEL
Calcium: 8.7 mg/dL (ref 8.4–10.5)
Creatinine, Ser: 0.78 mg/dL (ref 0.50–1.10)
GFR calc non Af Amer: 79 mL/min — ABNORMAL LOW (ref >90)
Glucose, Bld: 212 mg/dL — ABNORMAL HIGH (ref 70–99)
Sodium: 135 mEq/L (ref 135–145)

## 2011-10-27 LAB — GLUCOSE, CAPILLARY: Glucose-Capillary: 250 mg/dL — ABNORMAL HIGH (ref 70–99)

## 2011-10-27 MED ORDER — MOXIFLOXACIN HCL 400 MG PO TABS
400.0000 mg | ORAL_TABLET | Freq: Every day | ORAL | Status: DC
  Administered 2011-10-28 – 2011-10-29 (×2): 400 mg via ORAL
  Filled 2011-10-27 (×4): qty 1

## 2011-10-27 MED ORDER — POTASSIUM CHLORIDE 10 MEQ/100ML IV SOLN
10.0000 meq | Freq: Once | INTRAVENOUS | Status: AC
  Administered 2011-10-27: 10 meq via INTRAVENOUS
  Filled 2011-10-27: qty 100

## 2011-10-27 MED ORDER — POTASSIUM CHLORIDE CRYS ER 20 MEQ PO TBCR
20.0000 meq | EXTENDED_RELEASE_TABLET | Freq: Two times a day (BID) | ORAL | Status: DC
  Administered 2011-10-27 – 2011-10-28 (×2): 20 meq via ORAL
  Filled 2011-10-27 (×3): qty 1

## 2011-10-27 NOTE — Progress Notes (Signed)
Subjective:  Patient seen and examined ,still complains of cough and shortness of breath.  Objective: Vital signs in last 24 hours: Temp:  [98 F (36.7 C)-98.5 F (36.9 C)] 98.5 F (36.9 C) (01/02 1405) Pulse Rate:  [58-83] 83  (01/02 1405) Resp:  [18-20] 18  (01/02 1405) BP: (121-150)/(62-79) 150/79 mmHg (01/02 1405) SpO2:  [91 %-96 %] 96 % (01/02 1405) Weight change:  Last BM Date: 10/26/11  Intake/Output from previous day: 01/01 0701 - 01/02 0700 In: 1120 [P.O.:1120] Out: -      Physical Exam: General: Alert, awake, oriented x3, in mild  acute distress. Heart: Regular rate and rhythm, without murmurs, rubs, gallops. Lungs: Clear bilaterally. Abdomen: Soft, nontender, nondistended, positive bowel sounds. Extremities: No clubbing cyanosis or edema with positive pedal pulses. Neuro: Grossly intact, nonfocal.    Lab Results: Results for orders placed during the hospital encounter of 10/20/11 (from the past 24 hour(s))  GLUCOSE, CAPILLARY     Status: Abnormal   Collection Time   10/26/11  8:31 PM      Component Value Range   Glucose-Capillary 181 (*) 70 - 99 (mg/dL)   Comment 1 Notify RN    CBC     Status: Abnormal   Collection Time   10/27/11  6:40 AM      Component Value Range   WBC 10.5  4.0 - 10.5 (K/uL)   RBC 3.79 (*) 3.87 - 5.11 (MIL/uL)   Hemoglobin 11.4 (*) 12.0 - 15.0 (g/dL)   HCT 16.1 (*) 09.6 - 46.0 (%)   MCV 92.1  78.0 - 100.0 (fL)   MCH 30.1  26.0 - 34.0 (pg)   MCHC 32.7  30.0 - 36.0 (g/dL)   RDW 04.5  40.9 - 81.1 (%)   Platelets 258  150 - 400 (K/uL)  BASIC METABOLIC PANEL     Status: Abnormal   Collection Time   10/27/11  6:40 AM      Component Value Range   Sodium 135  135 - 145 (mEq/L)   Potassium 3.0 (*) 3.5 - 5.1 (mEq/L)   Chloride 94 (*) 96 - 112 (mEq/L)   CO2 32  19 - 32 (mEq/L)   Glucose, Bld 212 (*) 70 - 99 (mg/dL)   BUN 26 (*) 6 - 23 (mg/dL)   Creatinine, Ser 9.14  0.50 - 1.10 (mg/dL)   Calcium 8.7  8.4 - 78.2 (mg/dL)   GFR calc non  Af Amer 79 (*) >90 (mL/min)   GFR calc Af Amer >90  >90 (mL/min)  GLUCOSE, CAPILLARY     Status: Abnormal   Collection Time   10/27/11  8:24 AM      Component Value Range   Glucose-Capillary 225 (*) 70 - 99 (mg/dL)  GLUCOSE, CAPILLARY     Status: Abnormal   Collection Time   10/27/11 12:09 PM      Component Value Range   Glucose-Capillary 205 (*) 70 - 99 (mg/dL)  GLUCOSE, CAPILLARY     Status: Abnormal   Collection Time   10/27/11  5:08 PM      Component Value Range   Glucose-Capillary 114 (*) 70 - 99 (mg/dL)    Studies/Results: Dg Chest 2 View  10/26/2011  *RADIOLOGY REPORT*  Clinical Data: Cough and congestion.  Shortness of breath.  CHEST - 2 VIEW  Comparison: Two-view chest 10/20/2011.  Findings: Mild cardiomegaly persists.  Mild bibasilar atelectasis is similar to the prior study.  There is slight to increase in a mild interstitial pattern,  raising the possibility of mild edema. Atherosclerotic calcifications are noted.  IMPRESSION:  1.  Stable cardiomegaly with slight increase in interstitial prominence, suggesting the possibility of edema. 2.  Minimal bibasilar airspace disease likely reflects atelectasis.  Original Report Authenticated By: Jamesetta Orleans. MATTERN, M.D.    Medications:    . aspirin  81 mg Oral Daily  . chlorpheniramine-HYDROcodone  5 mL Oral Q12H  . feeding supplement  237 mL Oral TID WC  . fluticasone  2 spray Each Nare Daily  . furosemide  40 mg Oral Daily  . glimepiride  1 mg Oral Q breakfast  . guaiFENesin  1,200 mg Oral BID  . indapamide  2.5 mg Oral Q0700  . insulin aspart  0-15 Units Subcutaneous TID WC  . insulin aspart  0-5 Units Subcutaneous QHS  . levalbuterol  0.63 mg Nebulization Q6H  . metoprolol tartrate  12.5 mg Oral BID  . moxifloxacin  400 mg Oral q1800  . multivitamins ther. w/minerals  1 tablet Oral Daily  . NIFEdipine  60 mg Oral Daily  . pantoprazole  40 mg Oral BID AC  . pioglitazone  45 mg Oral Daily  . polysaccharide iron  150 mg  Oral Daily  . potassium chloride  10 mEq Oral Once  . rosuvastatin  5 mg Oral Daily  . sodium chloride  3 mL Intravenous Q12H  . DISCONTD: moxifloxacin  400 mg Intravenous Q24H    acetaminophen, acetaminophen, benzonatate, nitroGLYCERIN, ondansetron (ZOFRAN) IV, ondansetron     Assessment/Plan:  Dyspnea / Cough Pulmonary following. Continue tessalon pearls, tussionex, Sinus tachycardia  Resolved   Diabetes mellitus type 2  SSI , Moderate. Glucose well controlled. AKI  Resolved.  Hypokalemia: Will replace Potassium. KNOWN THORACIC AORTIC ANEURYSM  Cards following as Outpatient. METASTATIC Carcinoid tumor OF THE LUNGS  Dr Edwyna Shell following as outpatient.  HYPERTENSION  Continue Nifedipine, Metoprolol.         LOS: 7 days   Zayah Keilman S 10/27/2011, 6:25 PM

## 2011-10-27 NOTE — Progress Notes (Signed)
Utilization Review Completed.Porscha Axley T1/11/2011   

## 2011-10-27 NOTE — Progress Notes (Signed)
Name: [name] MRN: [id number] DOB: [birth year]    LOS: 7  PCCM Progress NOTE  History of Present Illness: 78 yo latvian female never smoker  speaks very little english admit 12/26 with cough and sob x 3 weeks pta seen by pulmonary 12/29 for failure to improve on treatment for copd/ab  Cough seems better today 10/26/10  Micro:   Antibiotics: Avelox (? Bronchitis) 12/26 >>>change to po 10/26/10  Tests / Events: Spirometry orderd 12/30>>>      Vital Signs: Temp:  [98 F (36.7 C)-98.5 F (36.9 C)] 98.5 F (36.9 C) (01/02 1405) Pulse Rate:  [58-83] 83  (01/02 1405) Resp:  [18-20] 18  (01/02 1405) BP: (121-150)/(62-79) 150/79 mmHg (01/02 1405) SpO2:  [91 %-96 %] 96 % (01/02 1405) I/O last 3 completed shifts: In: 1300 [P.O.:1300] Out: -   Physical Examination:  Elderly wf nad, congested cough HEENT unremarkable Lungs with insp/ exp rhonchi bilaterally RRR no s3  abd soft Ext warm without calf tenderness / edema      . aspirin  81 mg Oral Daily  . chlorpheniramine-HYDROcodone  5 mL Oral Q12H  . feeding supplement  237 mL Oral TID WC  . fluticasone  2 spray Each Nare Daily  . furosemide  40 mg Oral Daily  . glimepiride  1 mg Oral Q breakfast  . guaiFENesin  1,200 mg Oral BID  . indapamide  2.5 mg Oral Q0700  . insulin aspart  0-15 Units Subcutaneous TID WC  . insulin aspart  0-5 Units Subcutaneous QHS  . levalbuterol  0.63 mg Nebulization Q6H  . metoprolol tartrate  12.5 mg Oral BID  . moxifloxacin  400 mg Intravenous Q24H  . multivitamins ther. w/minerals  1 tablet Oral Daily  . NIFEdipine  60 mg Oral Daily  . pantoprazole  40 mg Oral BID AC  . pioglitazone  45 mg Oral Daily  . polysaccharide iron  150 mg Oral Daily  . potassium chloride  10 mEq Oral Once  . rosuvastatin  5 mg Oral Daily  . sodium chloride  3 mL Intravenous Q12H    Labs and Imaging:    Lab 10/27/11 0640 10/26/11 0635 10/25/11 0500  NA 135 141 138  K 3.0* 3.8 3.2*  CL 94* 98 98  CO2  32 35* 32  BUN 26* 32* 56*  CREATININE 0.78 0.91 1.04  GLUCOSE 212* 155* 129*    Lab 10/27/11 0640 10/24/11 0537 10/21/11 0630  HGB 11.4* 11.6* 12.6  HCT 34.9* 35.6* 38.2  WBC 10.5 10.8* 8.0  PLT 258 272 285   IMAGING:   Dg Chest 2 View  10/26/2011  *RADIOLOGY REPORT*  Clinical Data: Cough and congestion.  Shortness of breath.  CHEST - 2 VIEW  Comparison: Two-view chest 10/20/2011.  Findings: Mild cardiomegaly persists.  Mild bibasilar atelectasis is similar to the prior study.  There is slight to increase in a mild interstitial pattern, raising the possibility of mild edema. Atherosclerotic calcifications are noted.  IMPRESSION:  1.  Stable cardiomegaly with slight increase in interstitial prominence, suggesting the possibility of edema. 2.  Minimal bibasilar airspace disease likely reflects atelectasis.  Original Report Authenticated By: Jamesetta Orleans. MATTERN, M.D.   Assessment and Plan:  ?Lingular opacity, r/o pneumonia>> Refractory cough>> --on IV Avelox> continue change to PO --on Xopenex nebs as needed> change to Q6H --on Tussionex as needed --on Mucinex 1200mg  Bid + Fluids --add IS & Flutter    Shan Levans PCCM Service   Beeper  [phone]  Cell  [phone]

## 2011-10-28 DIAGNOSIS — R0602 Shortness of breath: Secondary | ICD-10-CM

## 2011-10-28 DIAGNOSIS — J441 Chronic obstructive pulmonary disease with (acute) exacerbation: Secondary | ICD-10-CM

## 2011-10-28 DIAGNOSIS — C7A090 Malignant carcinoid tumor of the bronchus and lung: Secondary | ICD-10-CM

## 2011-10-28 LAB — GLUCOSE, CAPILLARY
Glucose-Capillary: 286 mg/dL — ABNORMAL HIGH (ref 70–99)
Glucose-Capillary: 192 mg/dL — ABNORMAL HIGH (ref 70–99)
Glucose-Capillary: 245 mg/dL — ABNORMAL HIGH (ref 70–99)
Glucose-Capillary: 238 mg/dL — ABNORMAL HIGH (ref 70–99)

## 2011-10-28 LAB — BASIC METABOLIC PANEL
CO2: 31 mEq/L (ref 19–32)
Calcium: 8.8 mg/dL (ref 8.4–10.5)
Chloride: 93 mEq/L — ABNORMAL LOW (ref 96–112)
Glucose, Bld: 356 mg/dL — ABNORMAL HIGH (ref 70–99)
Sodium: 136 mEq/L (ref 135–145)

## 2011-10-28 MED ORDER — POTASSIUM CHLORIDE CRYS ER 20 MEQ PO TBCR
20.0000 meq | EXTENDED_RELEASE_TABLET | Freq: Two times a day (BID) | ORAL | Status: AC
  Administered 2011-10-28 – 2011-10-30 (×4): 20 meq via ORAL
  Filled 2011-10-28 (×3): qty 1

## 2011-10-28 NOTE — Progress Notes (Signed)
Inpatient Diabetes Program Recommendations  AACE/ADA: New Consensus Statement on Inpatient Glycemic Control (2009)  Target Ranges:  Prepandial:   less than 140 mg/dL      Peak postprandial:   less than 180 mg/dL (1-2 hours)      Critically ill patients:  140 - 180 mg/dL   Reason for Visit: CBG's = 286 and 192 mg/dL. Inpatient Diabetes Program Recommendations Insulin - Basal: Consider adding Lantus 10 units daily. Insulin - Meal Coverage: .  Note:

## 2011-10-28 NOTE — Progress Notes (Signed)
Name: [name] MRN: [id number] DOB: 1931/12/29    LOS: 8  PCCM Progress NOTE  History of Present Illness: 78 yo latvian female never smoker  speaks very little english admit 12/26 with cough and sob x 3 weeks pta seen by pulmonary 12/29 for failure to improve on treatment for copd/ab      Antibiotics: Avelox (? Bronchitis) 12/26 >>>change to po 1/2   Tests / Events: Spirometry ordered 12/30>>>      Vital Signs: Temp:  [97.8 F (36.6 C)-98.2 F (36.8 C)] 98.2 F (36.8 C) (01/03 1423) Pulse Rate:  [68-87] 68  (01/03 1423) Resp:  [18-20] 18  (01/03 1423) BP: (127-142)/(76-82) 128/76 mmHg (01/03 1423) SpO2:  [92 %-96 %] 92 % (01/03 1423)  on RA  Physical Examination:  Elderly wf nad, minimally congested sounding  cough HEENT unremarkable Lungs with min  insp/ exp rhonchi bilaterally RRR no s3  abd soft Ext warm without calf tenderness / edema      . aspirin  81 mg Oral Daily  . chlorpheniramine-HYDROcodone  5 mL Oral Q12H  . feeding supplement  237 mL Oral TID WC  . fluticasone  2 spray Each Nare Daily  . furosemide  40 mg Oral Daily  . glimepiride  1 mg Oral Q breakfast  . guaiFENesin  1,200 mg Oral BID  . indapamide  2.5 mg Oral Q0700  . insulin aspart  0-15 Units Subcutaneous TID WC  . insulin aspart  0-5 Units Subcutaneous QHS  . levalbuterol  0.63 mg Nebulization Q6H  . metoprolol tartrate  12.5 mg Oral BID  . moxifloxacin  400 mg Oral q1800  . multivitamins ther. w/minerals  1 tablet Oral Daily  . NIFEdipine  60 mg Oral Daily  . pantoprazole  40 mg Oral BID AC  . pioglitazone  45 mg Oral Daily  . polysaccharide iron  150 mg Oral Daily  . potassium chloride  10 mEq Intravenous Once  . potassium chloride  20 mEq Oral BID  . rosuvastatin  5 mg Oral Daily  . sodium chloride  3 mL Intravenous Q12H  . DISCONTD: moxifloxacin  400 mg Intravenous Q24H  . DISCONTD: potassium chloride  10 mEq Oral Once  . DISCONTD: potassium chloride  20 mEq Oral BID     Labs and Imaging:    Lab 10/28/11 0529 10/27/11 0640 10/26/11 0635  NA 136 135 141  K 3.3* 3.0* 3.8  CL 93* 94* 98  CO2 31 32 35*  BUN 30* 26* 32*  CREATININE 0.86 0.78 0.91  GLUCOSE 356* 212* 155*    Lab 10/27/11 0640 10/24/11 0537  HGB 11.4* 11.6*  HCT 34.9* 35.6*  WBC 10.5 10.8*  PLT 258 272   IMAGING:  cxr 1/1 Stable cardiomegaly with slight increase in interstitial  prominence, suggesting the possibility of edema.  2. Minimal bibasilar airspace disease likely reflects atelectasis.   ?Lingular opacity, r/o pneumonia>> Refractory cough>> --avelox since 12/26 --on Xopenex nebs   --on Tussionex as needed --on Mucinex 1200mg  Bid + Fluids --  IS & Flutter  Would complete 10 days avelox on 1/5 and f/u as outpt pulmonary prn - no further inpt f/u needed     Sandrea Hughs, MD Pulmonary and Critical Care Medicine [facility] [phone]

## 2011-10-28 NOTE — Progress Notes (Signed)
Subjective:  Patient seen and examined ,still complains of cough and shortness of breath.  Objective: Vital signs in last 24 hours: Temp:  [97.8 F (36.6 C)-98.5 F (36.9 C)] 97.8 F (36.6 C) (01/03 0500) Pulse Rate:  [83-87] 84  (01/03 0500) Resp:  [18-20] 20  (01/03 0500) BP: (127-150)/(79-82) 142/79 mmHg (01/03 0500) SpO2:  [93 %-96 %] 95 % (01/03 0908) Weight change:  Last BM Date: 10/26/11  Intake/Output from previous day:       Physical Exam: General: Alert, awake, oriented x3, in mild  acute distress. Heart: Regular rate and rhythm, without murmurs, rubs, gallops. Lungs: Clear bilaterally. Abdomen: Soft, nontender, nondistended, positive bowel sounds. Extremities: No clubbing cyanosis or edema with positive pedal pulses. Neuro: Grossly intact, nonfocal.    Lab Results: Results for orders placed during the hospital encounter of 10/20/11 (from the past 24 hour(s))  GLUCOSE, CAPILLARY     Status: Abnormal   Collection Time   10/27/11 12:09 PM      Component Value Range   Glucose-Capillary 205 (*) 70 - 99 (mg/dL)  GLUCOSE, CAPILLARY     Status: Abnormal   Collection Time   10/27/11  5:08 PM      Component Value Range   Glucose-Capillary 114 (*) 70 - 99 (mg/dL)  GLUCOSE, CAPILLARY     Status: Abnormal   Collection Time   10/27/11  9:29 PM      Component Value Range   Glucose-Capillary 250 (*) 70 - 99 (mg/dL)  BASIC METABOLIC PANEL     Status: Abnormal   Collection Time   10/28/11  5:29 AM      Component Value Range   Sodium 136  135 - 145 (mEq/L)   Potassium 3.3 (*) 3.5 - 5.1 (mEq/L)   Chloride 93 (*) 96 - 112 (mEq/L)   CO2 31  19 - 32 (mEq/L)   Glucose, Bld 356 (*) 70 - 99 (mg/dL)   BUN 30 (*) 6 - 23 (mg/dL)   Creatinine, Ser 4.09  0.50 - 1.10 (mg/dL)   Calcium 8.8  8.4 - 81.1 (mg/dL)   GFR calc non Af Amer 64 (*) >90 (mL/min)   GFR calc Af Amer 74 (*) >90 (mL/min)    Studies/Results: No results found.  Medications:    . aspirin  81 mg Oral Daily  .  chlorpheniramine-HYDROcodone  5 mL Oral Q12H  . feeding supplement  237 mL Oral TID WC  . fluticasone  2 spray Each Nare Daily  . furosemide  40 mg Oral Daily  . glimepiride  1 mg Oral Q breakfast  . guaiFENesin  1,200 mg Oral BID  . indapamide  2.5 mg Oral Q0700  . insulin aspart  0-15 Units Subcutaneous TID WC  . insulin aspart  0-5 Units Subcutaneous QHS  . levalbuterol  0.63 mg Nebulization Q6H  . metoprolol tartrate  12.5 mg Oral BID  . moxifloxacin  400 mg Oral q1800  . multivitamins ther. w/minerals  1 tablet Oral Daily  . NIFEdipine  60 mg Oral Daily  . pantoprazole  40 mg Oral BID AC  . pioglitazone  45 mg Oral Daily  . polysaccharide iron  150 mg Oral Daily  . potassium chloride  10 mEq Oral Once  . potassium chloride  10 mEq Intravenous Once  . potassium chloride  20 mEq Oral BID  . rosuvastatin  5 mg Oral Daily  . sodium chloride  3 mL Intravenous Q12H  . DISCONTD: moxifloxacin  400 mg Intravenous  Q24H    acetaminophen, acetaminophen, benzonatate, nitroGLYCERIN, ondansetron (ZOFRAN) IV, ondansetron     Assessment/Plan:  Dyspnea / Cough Pulmonary following. Continue tessalon pearls, tussionex. Continue moxifloxacin.  Sinus tachycardia  Resolved   Diabetes mellitus type 2  SSI , Moderate. Glucose well controlled.  AKI  Resolved.  Hypokalemia: Will replace Potassium.  KNOWN THORACIC AORTIC ANEURYSM  Cards following as Outpatient.  METASTATIC Carcinoid tumor OF THE LUNGS  Dr Edwyna Shell following as outpatient.   HYPERTENSION  Continue Nifedipine, Metoprolol.  DVT Prophylaxis: SCD's.       LOS: 8 days   Vivika Poythress S 10/28/2011, 10:33 AM

## 2011-10-29 ENCOUNTER — Inpatient Hospital Stay (HOSPITAL_COMMUNITY): Payer: Medicare Other

## 2011-10-29 DIAGNOSIS — I359 Nonrheumatic aortic valve disorder, unspecified: Secondary | ICD-10-CM

## 2011-10-29 LAB — BASIC METABOLIC PANEL
BUN: 18 mg/dL (ref 6–23)
CO2: 33 mEq/L — ABNORMAL HIGH (ref 19–32)
Calcium: 8.8 mg/dL (ref 8.4–10.5)
Chloride: 91 mEq/L — ABNORMAL LOW (ref 96–112)
Creatinine, Ser: 0.71 mg/dL (ref 0.50–1.10)
Glucose, Bld: 357 mg/dL — ABNORMAL HIGH (ref 70–99)

## 2011-10-29 LAB — GLUCOSE, CAPILLARY
Glucose-Capillary: 305 mg/dL — ABNORMAL HIGH (ref 70–99)
Glucose-Capillary: 82 mg/dL (ref 70–99)

## 2011-10-29 LAB — SEDIMENTATION RATE: Sed Rate: 73 mm/hr — ABNORMAL HIGH (ref 0–22)

## 2011-10-29 NOTE — Progress Notes (Signed)
Subjective:  Patient seen and examined , Pulmonary recommended to discharge her home and follow up as outpatient. Patient does not want to leave the hospital and wants to talk to Dr Delford Field.  Objective: Vital signs in last 24 hours: Temp:  [97.4 F (36.3 C)-98.2 F (36.8 C)] 97.4 F (36.3 C) (01/04 0500) Pulse Rate:  [57-82] 57  (01/04 0500) Resp:  [18-20] 20  (01/04 0500) BP: (124-135)/(63-76) 135/63 mmHg (01/04 0500) SpO2:  [92 %-95 %] 94 % (01/04 0854) Weight change:  Last BM Date: 10/28/11  Intake/Output from previous day:       Physical Exam: General: Alert, awake, oriented x3, in mild  acute distress. Heart: Regular rate and rhythm, without murmurs, rubs, gallops. Lungs: Scattered wheezing Abdomen: Soft, nontender, nondistended, positive bowel sounds. Extremities: No clubbing cyanosis or edema with positive pedal pulses. Neuro: Grossly intact, nonfocal.    Lab Results: Results for orders placed during the hospital encounter of 10/20/11 (from the past 24 hour(s))  GLUCOSE, CAPILLARY     Status: Abnormal   Collection Time   10/28/11 11:33 AM      Component Value Range   Glucose-Capillary 192 (*) 70 - 99 (mg/dL)  GLUCOSE, CAPILLARY     Status: Abnormal   Collection Time   10/28/11  4:35 PM      Component Value Range   Glucose-Capillary 245 (*) 70 - 99 (mg/dL)  GLUCOSE, CAPILLARY     Status: Abnormal   Collection Time   10/28/11  8:31 PM      Component Value Range   Glucose-Capillary 238 (*) 70 - 99 (mg/dL)   Comment 1 Notify RN    GLUCOSE, CAPILLARY     Status: Abnormal   Collection Time   10/29/11  7:23 AM      Component Value Range   Glucose-Capillary 159 (*) 70 - 99 (mg/dL)   Comment 1 Notify RN      Studies/Results: No results found.  Medications:    . aspirin  81 mg Oral Daily  . chlorpheniramine-HYDROcodone  5 mL Oral Q12H  . feeding supplement  237 mL Oral TID WC  . fluticasone  2 spray Each Nare Daily  . furosemide  40 mg Oral Daily  .  glimepiride  1 mg Oral Q breakfast  . guaiFENesin  1,200 mg Oral BID  . indapamide  2.5 mg Oral Q0700  . insulin aspart  0-15 Units Subcutaneous TID WC  . insulin aspart  0-5 Units Subcutaneous QHS  . levalbuterol  0.63 mg Nebulization Q6H  . metoprolol tartrate  12.5 mg Oral BID  . moxifloxacin  400 mg Oral q1800  . multivitamins ther. w/minerals  1 tablet Oral Daily  . NIFEdipine  60 mg Oral Daily  . pantoprazole  40 mg Oral BID AC  . pioglitazone  45 mg Oral Daily  . polysaccharide iron  150 mg Oral Daily  . potassium chloride  20 mEq Oral BID  . rosuvastatin  5 mg Oral Daily  . sodium chloride  3 mL Intravenous Q12H  . DISCONTD: potassium chloride  10 mEq Oral Once  . DISCONTD: potassium chloride  20 mEq Oral BID    acetaminophen, acetaminophen, benzonatate, nitroGLYCERIN, ondansetron (ZOFRAN) IV, ondansetron     Assessment/Plan:  Dyspnea / Cough Pulmonary following. Continue tessalon pearls, tussionex. Continue moxifloxacin. Dr Sherene Sires has been contacted and will see the patient today.  Sinus tachycardia  Resolved   Diabetes mellitus type 2  SSI , Moderate. Glucose well controlled.  AKI  Resolved.  Hypokalemia: Will check BMP today.  ? Pulmonary edema: Patient on digoxin.  KNOWN THORACIC AORTIC ANEURYSM  Cards following as Outpatient.  METASTATIC Carcinoid tumor OF THE LUNGS  Dr Edwyna Shell following as outpatient.   HYPERTENSION  Continue Nifedipine, Metoprolol.  DVT Prophylaxis: SCD's.       LOS: 9 days   LAMA,GAGAN S 10/29/2011, 10:08 AM

## 2011-10-29 NOTE — Progress Notes (Signed)
  Echocardiogram 2D Echocardiogram has been performed.  Lenin Kuhnle, Real Cons 10/29/2011, 3:36 PM

## 2011-10-30 LAB — GLUCOSE, CAPILLARY
Glucose-Capillary: 266 mg/dL — ABNORMAL HIGH (ref 70–99)
Glucose-Capillary: 100 mg/dL — ABNORMAL HIGH (ref 70–99)

## 2011-10-30 MED ORDER — MOXIFLOXACIN HCL 400 MG PO TABS: 400.0000 mg | ORAL_TABLET | Freq: Every day | ORAL | Status: AC

## 2011-10-30 MED ORDER — METOPROLOL TARTRATE 12.5 MG HALF TABLET: 12.5000 mg | ORAL_TABLET | Freq: Two times a day (BID) | ORAL | Status: DC

## 2011-10-30 MED ORDER — BENZONATATE 200 MG PO CAPS: 200.0000 mg | ORAL_CAPSULE | Freq: Three times a day (TID) | ORAL | Status: AC | PRN

## 2011-10-30 MED ORDER — OLMESARTAN MEDOXOMIL 20 MG PO TABS: 20.0000 mg | ORAL_TABLET | Freq: Every day | ORAL | Status: DC

## 2011-10-30 MED ORDER — GUAIFENESIN ER 600 MG PO TB12: 1200.0000 mg | ORAL_TABLET | Freq: Two times a day (BID) | ORAL | Status: DC

## 2011-10-30 NOTE — Discharge Summary (Addendum)
[name], 78 y.o., DOB 21-Apr-1932, MRN [id number]. Admission date: 10/20/2011 Discharge Date 10/30/2011 Primary MD [name], MD Admitting Physician Clint Lipps  Admission Diagnosis  COPD exacerbation [491.21] COPD Exacerbation sneeze  Discharge Diagnosis   Principal Problem:  *BRONCHITIS Active Problems:  DM  HYPERCHOLESTEROLEMIA  AORTIC ANEURYSM  Carcinoid tumor    *  Past Medical History  Diagnosis Date  . Cancer   . Diabetes mellitus   . Hypertension   . Coronary artery disease   . Aortic insufficiency   . Hyperlipidemia   . Aortic aneurysm   . Hypercholesterolemia   . COPD (chronic obstructive pulmonary disease)   . Bronchitis   . Edema   . GERD (gastroesophageal reflux disease)   . Aortic valve disease     stable    Past Surgical History  Procedure Date  . Pars plana vitrectomy     right eye  . Posterior capsulectomy     right eye  . Pars plana vitrectomy w/ endophotocoagulation     right eye  . Fiberoptic bronch with endobronchial u/s 07/02/2010  . Video bronchoscopy 12/09/2009  . Endobronchial excision of right upper lobe tumor with laser bronchi 10/06/2009  . Bronch with endobronchial biopies 09/15/2009    Brief History and physical HPI: 78 year-old female with a history of metastatic carcinoid tumor, diabetes mellitus type 2, ascending aortic aneurysm last measured was 5 cm and is being followed with Dr. Eden [name] cardiology present with on going shortness of breath over the last 2 weeks with subjective feeling of fever chills cough. She had gone to her primary care physician last week and was prescribed antibiotics despite which the patient did not improve so patient came to the ER. Patient denies any chest pain, nausea vomiting, abdominal pain, diarrhea or any flushing symptoms. Chest x-ray at this time does not show any infiltrates patient will be admitted for bronchitis.   Hospital Course   Acute Bronchitis: Patient was admitted with  shortness of breath which was presumed to be bronchitis influenza screen was negative chest x-ray showed no focal infiltrate patient was started on empiric antibiotic nebulizers and also on inhaled steroids. Patient also was given IV steroids but did not have much improvement Superwet consultation was obtained. Patient was started on diuresis as per Dr. Shelle Iron, her BNP did come out elevated 2-D echo showed only mild LVH. Patient's breathing has improved though she still has cough. Dr. Sherene Sires, Dr. Delford Field, Dr. Shelle Iron has followed the patient during hospitalization. At this time recommend Mucinex, Tessalon Perles and completing the 10 days of antibiotics with Avelox. Patient will be given 3 more days of Avelox to be taken by mouth and will followup with Dr. Delford Field, pulmonary in his office in 10 days  Pulmonary edema Patient's chest x-ray showed pulmonary edema and BNP also was elevated to 600. Patient was given Lasix and indapamide during the hospitalization though she was taking these medications at home too. Patient does have a history of leg swelling. 2-D echo couldn't land on the hospital showed LVH with an EF of 50-60% wall motion was normal systolic function was normal but wall thickness was increased in a pattern of moderate LVH. At this time I would continue patient on Lasix and indapamide since she has clinically improved with diuresis and had BNP also was elevated to 900 as of 10/28/2010. Patient is to followup with her primary care physician who can check her potassium levels and adjust medications accordingly.  Sinus tachycardia Patient developed sinus  tachycardia in the hospital, she was started on low-dose metoprolol 12.5 mg by mouth twice a day. Also she was started on Xopenex inhalation. At this time I'm going to send the patient home on metoprolol 12.5 by mouth twice a day since her heart rate is well controlled. Patient will be sent on albuterol inhaler to be used when necessary for shortness of  breath. Due to the cost issues we'll not prescribe Xopenex. This was discussed with Dr. Maple Hudson.  Hypertension Patient has been on multiple antihypertensive medications. Will cut down the dose of Benicar to 20 mg by mouth daily and continue furosemide indapamide metoprolol nifedipine.  Metastatic carcinoid tumor of the lungs  Patient saw Dr. Edwyna Shell on 09/28/2011 at that time he felt that pulmonary disease was stable, the plan is to follow Dr. Edwyna Shell in 6 months time with another CT scan this was confirmed with the patient's family as well as calling a doctor but his office.  Known thoracic aortic aneurysm He should follow Dr. Eden [name] as outpatient. This it this has been stable as per last couple evaluation on 05/13/10.       Consults  Pulmonary  Significant Tests:  See full reports for all details    Dg Chest 2 View  10/29/2011  *RADIOLOGY REPORT*  Clinical Data: Cough  CHEST - 2 VIEW  Comparison: 10/26/2011  Findings: Cardiomegaly again noted.  Minimal perihilar interstitial prominence without convincing pulmonary edema.  Slight improvement in aeration.  Mild basilar atelectasis again noted.  IMPRESSION: Minimal perihilar interstitial prominence without convincing pulmonary edema.  Slight improvement in aeration.  Mild basilar atelectasis again noted.  Original Report Authenticated By: Natasha Mead, M.D.   Dg Chest 2 View  10/26/2011  *RADIOLOGY REPORT*  Clinical Data: Cough and congestion.  Shortness of breath.  CHEST - 2 VIEW  Comparison: Two-view chest 10/20/2011.  Findings: Mild cardiomegaly persists.  Mild bibasilar atelectasis is similar to the prior study.  There is slight to increase in a mild interstitial pattern, raising the possibility of mild edema. Atherosclerotic calcifications are noted.  IMPRESSION:  1.  Stable cardiomegaly with slight increase in interstitial prominence, suggesting the possibility of edema. 2.  Minimal bibasilar airspace disease likely reflects atelectasis.   Original Report Authenticated By: Jamesetta Orleans. MATTERN, M.D.   Dg Chest 2 View  10/20/2011  *RADIOLOGY REPORT*  Clinical Data: Cough and wheezing.  Coronary artery disease. Hypertension and diabetes.  CHEST - 2 VIEW  Comparison: 10/14/2011 and 09/30/2010  Findings: Mild bilateral lower lobe scarring is again seen without significant change. No evidence of acute infiltrate or edema.  No evidence of pleural effusion.  Mild to moderate cardiomegaly stable.  Tortuosity of the thoracic aorta is also unchanged.  No mass or lymphadenopathy identified. No significant change seen compared to prior exam.  IMPRESSION: Stable cardiomegaly and mild bibasilar scarring.  No active disease.  Original Report Authenticated By: Danae Orleans, M.D.   Dg Chest 2 View  10/14/2011  *RADIOLOGY REPORT*  Clinical Data: Cough  CHEST - 2 VIEW  Comparison: CT chest dated 09/28/2011.  Findings: Bilateral lower lobe opacities, likely atelectasis / scarring.  Known numerous bilateral pulmonary nodules are better demonstrated on CT.  Mild cardiomegaly.  Degenerative changes of the visualized thoracolumbar spine.  IMPRESSION: Bilateral lower lobe opacities, likely atelectasis / scarring.  Known numerous bilateral pulmonary nodules are better demonstrated on CT.  Mild cardiomegaly.  Original Report Authenticated By: Charline Bills, M.D.   Ct Maxillofacial  Ltd Wo Cm  10/25/2011  *  RADIOLOGY REPORT*  Clinical Data:  Chronic cough.  Metastatic carcinoid to the lung.  CT LIMITED SINUSES WITHOUT CONTRAST  Technique:  Multidetector CT images of the paranasal sinuses were obtained in a single plane without contrast.  Comparison:  None available.  Findings:  Minimal mucosal thickening is present in the left maxillary sinus.  The left maxillary sinus appears smaller than the right , compatible with a shrunken sinus and chronic disease.  No other significant mucosal disease is evident.  The mastoid air cells are clear bilaterally.  Limited  imaging of the soft tissues of the neck reveal atherosclerotic calcifications at the carotid bifurcations. Limited imaging of the brain demonstrates mild atrophy white matter disease.  IMPRESSION:  1.  Chronic left maxillary sinus disease with minimal mucosal thickening. 2.  No evidence for acute disease. 3.  Mild cerebral atrophy and white matter disease.  Original Report Authenticated By: Jamesetta Orleans. MATTERN, M.D.     Today   Subjective:   Tiann Pavlock today has no headache,no chest abdominal pain,no shortness of breath. Objective:   Blood pressure 121/78, pulse 80, temperature 97.9 F (36.6 C), temperature source Oral, resp. rate 18, height 5' (1.524 m), weight 80.3 kg (177 lb 0.5 oz), SpO2 96.00%.  Intake/Output Summary (Last 24 hours) at 10/30/11 1712 Last data filed at 10/30/11 1300  Gross per 24 hour  Intake    480 ml  Output      0 ml  Net    480 ml    Exam Awake Alert, Oriented *3, No new F.N deficits, Normal affect [place].AT,PERRAL Supple Neck,No JVD, No cervical lymphadenopathy appriciated.  Symmetrical Chest wall movement, Good air movement bilaterally, CTAB RRR,No Gallops,Rubs or new Murmurs, No Parasternal Heave +ve B.Sounds, Abd Soft, Non tender, No organomegaly appriciated, No rebound -guarding or rigidity. No Cyanosis, Clubbing or edema, No new Rash or bruise  Data Review   Cultures -  CBC w Diff: Lab Results  Component Value Date   WBC 10.5 10/27/2011   WBC 5.5 11/25/2010   HGB 11.4* 10/27/2011   HGB 12.2 11/25/2010   HCT 34.9* 10/27/2011   HCT 36.7 11/25/2010   PLT 258 10/27/2011   PLT 213 11/25/2010   LYMPHOPCT 22.0 11/25/2010   MONOPCT 7.6 11/25/2010   EOSPCT 1.3 11/25/2010   BASOPCT 0.4 11/25/2010   CMP: Lab Results  Component Value Date   NA 134* 10/29/2011   NA 141 11/25/2010   K 3.5 10/29/2011   K 3.8 11/25/2010   CL 91* 10/29/2011   CL 97* 11/25/2010   CO2 33* 10/29/2011   CO2 31 11/25/2010   BUN 18 10/29/2011   BUN 18 11/25/2010   CREATININE 0.71 10/29/2011   CREATININE 0.7  11/25/2010   PROT 7.7 10/21/2011   PROT 7.0 11/25/2010   ALBUMIN 3.5 10/21/2011   BILITOT 0.3 10/21/2011   BILITOT 0.60 11/25/2010   ALKPHOS 79 10/21/2011   ALKPHOS 68 11/25/2010   AST 22 10/21/2011   AST 29 11/25/2010   ALT 17 10/21/2011  .  Micro Results No results found for this or any previous visit (from the past 240 hour(s)).   Discharge Instructions      Follow-up Information    Follow up with [name],[name] in 2 weeks.      Follow up with Shan Levans, MD in 10 days.   Contact information:   [address] [facility] [address] [place] [address] [place] [address] [phone]  Discharge Medications   Medication List  As of 10/30/2011  5:12 PM   START taking these medications         benzonatate 200 MG capsule   Commonly known as: TESSALON   Take 1 capsule (200 mg total) by mouth 3 (three) times daily as needed for cough.      guaiFENesin 600 MG 12 hr tablet   Commonly known as: MUCINEX   Take 2 tablets (1,200 mg total) by mouth 2 (two) times daily.      metoprolol tartrate 12.5 mg Tabs   Commonly known as: LOPRESSOR   Take 0.5 tablets (12.5 mg total) by mouth 2 (two) times daily.      moxifloxacin 400 MG tablet   Commonly known as: AVELOX   Take 1 tablet (400 mg total) by mouth daily at 6 PM.         CONTINUE taking these medications         acetaminophen 325 MG tablet   Commonly known as: TYLENOL      aspirin 81 MG tablet      FLONASE 50 MCG/ACT nasal spray   Generic drug: fluticasone      Fluticasone-Salmeterol 100-50 MCG/DOSE Aepb   Commonly known as: ADVAIR      furosemide 40 MG tablet   Commonly known as: LASIX   Take 1 tablet (40 mg total) by mouth daily.      Ginkgo Biloba Extr      glimepiride 1 MG tablet   Commonly known as: AMARYL      indapamide 2.5 MG tablet   Commonly known as: LOZOL      INTEGRA PLUS Caps      multivitamin per tablet      NIFEdipine 60 MG 24 hr tablet   Commonly known as: PROCARDIA XL/ADALAT-CC       nitroGLYCERIN 0.4 MG SL tablet   Commonly known as: NITROSTAT   Place 1 tablet (0.4 mg total) under the tongue every 5 (five) minutes as needed.      omeprazole 40 MG capsule   Commonly known as: PRILOSEC      pioglitazone 45 MG tablet   Commonly known as: ACTOS      potassium chloride 10 MEQ CR tablet   Commonly known as: KLOR-CON      PROVENTIL HFA 108 (90 BASE) MCG/ACT inhaler   Generic drug: albuterol      rosuvastatin 5 MG tablet   Commonly known as: CRESTOR      TUSSIONEX PENNKINETIC ER 10-8 MG/5ML Lqcr   Generic drug: chlorpheniramine-HYDROcodone         STOP taking these medications         olmesartan 40 MG tablet          Where to get your medications    These are the prescriptions that you need to pick up.   You may get these medications from any pharmacy.         benzonatate 200 MG capsule   guaiFENesin 600 MG 12 hr tablet   metoprolol tartrate 12.5 mg Tabs   moxifloxacin 400 MG tablet             Total Time in preparing paper work, data evaluation and todays exam - 35 minutes  Fatma Rutten S M.D on 10/30/2011 at 5:12 PM  [facility] Office  [phone]

## 2011-10-31 NOTE — Progress Notes (Signed)
Patient was discharged on 10/29/10.[name] and talked to the patient's daughter and asked not to give lasix 40 mg po daily. Earlier patient was taking it only on as needed basis for leg selling. Asked to continue taking on prn basis for leg swelling.

## 2011-11-02 ENCOUNTER — Encounter: Payer: Medicare Other | Attending: Physical Medicine & Rehabilitation

## 2011-11-02 ENCOUNTER — Ambulatory Visit: Payer: Medicare Other | Admitting: Physical Medicine & Rehabilitation

## 2011-11-09 ENCOUNTER — Ambulatory Visit: Payer: Medicare Other

## 2011-11-12 ENCOUNTER — Ambulatory Visit: Payer: Medicare Other

## 2011-11-18 ENCOUNTER — Ambulatory Visit: Payer: Medicare Other | Admitting: Physical Medicine & Rehabilitation

## 2011-11-30 ENCOUNTER — Ambulatory Visit: Payer: Medicare Other

## 2011-12-01 ENCOUNTER — Other Ambulatory Visit: Payer: Self-pay | Admitting: *Deleted

## 2011-12-01 ENCOUNTER — Other Ambulatory Visit: Payer: Self-pay | Admitting: Cardiovascular Disease

## 2011-12-01 MED ORDER — OLMESARTAN MEDOXOMIL 20 MG PO TABS: 20.0000 mg | ORAL_TABLET | Freq: Every day | ORAL | Status: DC

## 2011-12-01 NOTE — Telephone Encounter (Signed)
Pt calling re refill request not called in yet for benecar, per pharmacy they have sent fax, needs called in tomorrow will be out then, she said she takes it TWO times a day, [facility]

## 2011-12-02 ENCOUNTER — Other Ambulatory Visit (HOSPITAL_BASED_OUTPATIENT_CLINIC_OR_DEPARTMENT_OTHER): Payer: Medicare Other | Admitting: Lab

## 2011-12-02 ENCOUNTER — Inpatient Hospital Stay (HOSPITAL_COMMUNITY)
Admission: RE | Admit: 2011-12-02 | Discharge: 2011-12-02 | Payer: Medicare Other | Source: Ambulatory Visit | Attending: Internal Medicine | Admitting: Internal Medicine

## 2011-12-02 DIAGNOSIS — D381 Neoplasm of uncertain behavior of trachea, bronchus and lung: Secondary | ICD-10-CM

## 2011-12-02 LAB — CBC WITH DIFFERENTIAL/PLATELET
Basophils Absolute: 0 10*3/uL (ref 0.0–0.1)
Eosinophils Absolute: 0.1 10*3/uL (ref 0.0–0.5)
HGB: 12.4 g/dL (ref 11.6–15.9)
NEUT#: 4.2 10*3/uL (ref 1.5–6.5)
RBC: 4.03 10*6/uL (ref 3.70–5.45)
RDW: 13.9 % (ref 11.2–14.5)
WBC: 5.8 10*3/uL (ref 3.9–10.3)
lymph#: 1.1 10*3/uL (ref 0.9–3.3)

## 2011-12-02 LAB — CMP (CANCER CENTER ONLY)
AST: 30 U/L (ref 11–38)
Albumin: 3.9 g/dL (ref 3.3–5.5)
BUN, Bld: 20 mg/dL (ref 7–22)
Calcium: 9.1 mg/dL (ref 8.0–10.3)
Chloride: 100 mEq/L (ref 98–108)
Glucose, Bld: 164 mg/dL — ABNORMAL HIGH (ref 73–118)
Potassium: 3.7 mEq/L (ref 3.3–4.7)
Sodium: 144 mEq/L (ref 128–145)
Total Protein: 7.6 g/dL (ref 6.4–8.1)

## 2011-12-02 MED ORDER — OLMESARTAN MEDOXOMIL 40 MG PO TABS: 40.0000 mg | ORAL_TABLET | Freq: Two times a day (BID) | ORAL | Status: DC

## 2011-12-02 NOTE — Telephone Encounter (Signed)
New Msg: Pt calling stating that she needs a refill of benicar 40 mg 2x/day. Please call rx in with correct rx and dosage and directions. Pt said every time she gets a refill we get the dosage and directions incorrect. Please call the correct RX in.

## 2011-12-07 ENCOUNTER — Ambulatory Visit (HOSPITAL_BASED_OUTPATIENT_CLINIC_OR_DEPARTMENT_OTHER): Payer: Medicare Other | Admitting: Internal Medicine

## 2011-12-07 ENCOUNTER — Telehealth: Payer: Self-pay | Admitting: Internal Medicine

## 2011-12-07 VITALS — BP 184/81 | HR 74 | Temp 98.1°F | Ht 60.0 in | Wt 179.0 lb

## 2011-12-07 DIAGNOSIS — D3A00 Benign carcinoid tumor of unspecified site: Secondary | ICD-10-CM

## 2011-12-07 NOTE — Telephone Encounter (Signed)
appt made and printed for 8/9 and 8/12   aom

## 2011-12-07 NOTE — Progress Notes (Signed)
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  PRIOR THERAPY: 1. Status post resection of multiple nodules from the left lung under the care of Dr Edwyna Shell on July 15, 2005. 2. Status post repeat bronchoscopy with endobronchial excision of the right upper lobe tumor and laser bronchoscopy under the care of Dr Edwyna Shell on October 08, 2009.  CURRENT THERAPY:  Observation.  INTERVAL HISTORY: [name] 78 y.o. female returns to the clinic today for annual followup visit. The patient has a little improvement in her English and she was able to communicate very well today. She denied having any significant chest pain or shortness of breath, no cough or hemoptysis. She denied having any significant weight loss or night sweats. She was diagnosed with pneumonia in December of 2012. She has repeat CT scan of the chest performed 09/28/2011 and she is here for evaluation and discussion of her scan results.  MEDICAL HISTORY: Past Medical History  Diagnosis Date  . Cancer   . Diabetes mellitus   . Hypertension   . Coronary artery disease   . Aortic insufficiency   . Hyperlipidemia   . Aortic aneurysm   . Hypercholesterolemia   . COPD (chronic obstructive pulmonary disease)   . Bronchitis   . Edema   . GERD (gastroesophageal reflux disease)   . Aortic valve disease     stable  . Pain in joint, forearm   . Pain in joint, hand   . Pain in joint, lower leg   . Difficulty in walking   . Disturbance of skin sensation     ALLERGIES:   has no known allergies.  MEDICATIONS:  Current Outpatient Prescriptions  Medication Sig Dispense Refill  . acetaminophen (TYLENOL) 325 MG tablet Take 650 mg by mouth every 6 (six) hours as needed.        Marland Kitchen albuterol (PROVENTIL HFA) 108 (90 BASE) MCG/ACT inhaler Inhale 2 puffs into the lungs every 6 (six) hours as needed.          Marland Kitchen aspirin 81 MG tablet Take 81 mg by mouth daily.        . chlorpheniramine-HYDROcodone (TUSSIONEX PENNKINETIC ER) 10-8 MG/5ML LQCR Take 5 mLs by mouth as needed.        . diclofenac sodium (VOLTAREN) 1 % GEL Apply 1 application topically 4 (four) times daily. To hands and wrists      . FeFum-FePoly-FA-B Cmp-C-Biot (INTEGRA PLUS) CAPS Take 1 capsule by mouth daily.        . fluticasone (FLONASE) 50 MCG/ACT nasal spray Place 2 sprays into the nose daily.        . Fluticasone-Salmeterol (ADVAIR) 100-50 MCG/DOSE AEPB Inhale 1 puff into the lungs daily.        . furosemide (LASIX) 40 MG tablet Take 1 tablet (40 mg total) by mouth daily.  30 tablet  11  . Ginkgo Biloba EXTR Take 1 tablet by mouth daily.        Marland Kitchen glimepiride (AMARYL) 1 MG tablet Take 1 mg by mouth daily.        Marland Kitchen guaiFENesin (MUCINEX) 600 MG 12 hr tablet Take 2 tablets (1,200 mg total) by mouth 2 (two) times daily.  30 tablet  0  . ibuprofen (ADVIL,MOTRIN) 200 MG tablet Take 400 mg by mouth every 6 (six) hours as needed.      Marland Kitchen  indapamide (LOZOL) 2.5 MG tablet Take 2.5 mg by mouth every morning.        . metoprolol tartrate (LOPRESSOR) 12.5 mg TABS Take 0.5 tablets (12.5 mg total) by mouth 2 (two) times daily.  30 tablet  1  . multivitamin (THERAGRAN) per tablet Take 1 tablet by mouth daily.        Marland Kitchen NIFEdipine (PROCARDIA XL/ADALAT-CC) 60 MG 24 hr tablet Take 60 mg by mouth daily.        . nitroGLYCERIN (NITROSTAT) 0.4 MG SL tablet Place 1 tablet (0.4 mg total) under the tongue every 5 (five) minutes as needed.  90 tablet  1  . olmesartan (BENICAR) 40 MG tablet Take 1 tablet (40 mg total) by mouth 2 (two) times daily.  60 tablet  12  . omeprazole (PRILOSEC) 40 MG capsule Take 40 mg by mouth 2 (two) times daily.       . pioglitazone (ACTOS) 45 MG tablet Take 45 mg by mouth daily.        . potassium chloride (K-DUR,KLOR-CON) 10 MEQ tablet Take 20 mEq by mouth daily.      . potassium chloride (KLOR-CON) 10 MEQ CR tablet Take 10 mEq by  mouth 2 (two) times daily.       . rosuvastatin (CRESTOR) 5 MG tablet Take 10 mg by mouth daily.       . traMADol-acetaminophen (ULTRACET) 37.5-325 MG per tablet Take 1 tablet by mouth 2 (two) times daily.        SURGICAL HISTORY:  Past Surgical History  Procedure Date  . Pars plana vitrectomy     right eye  . Posterior capsulectomy     right eye  . Pars plana vitrectomy w/ endophotocoagulation     right eye  . Fiberoptic bronch with endobronchial u/s 07/02/2010  . Video bronchoscopy 12/09/2009  . Endobronchial excision of right upper lobe tumor with laser bronchi 10/06/2009  . Bronch with endobronchial biopies 09/15/2009    REVIEW OF SYSTEMS:  A comprehensive review of systems was negative.   PHYSICAL EXAMINATION: General appearance: alert, cooperative and no distress Neck: no adenopathy Resp: clear to auscultation bilaterally Back: no kyphosis present, no scoliosis present, symmetric, no curvature. ROM normal. No CVA tenderness. Cardio: regular rate and rhythm, S1, S2 normal, no murmur, click, rub or gallop GI: soft, non-tender; bowel sounds normal; no masses,  no organomegaly Extremities: extremities normal, atraumatic, no cyanosis or edema  ECOG PERFORMANCE STATUS: 0 - Asymptomatic  Blood pressure 184/81, pulse 74, temperature 98.1 F (36.7 C), temperature source Oral, height 5' (1.524 m), weight 179 lb (81.194 kg).  LABORATORY DATA: Lab Results  Component Value Date   WBC 5.8 12/02/2011   HGB 12.4 12/02/2011   HCT 36.8 12/02/2011   MCV 91.2 12/02/2011   PLT 231 12/02/2011      Chemistry      Component Value Date/Time   NA 144 12/02/2011 1053   NA 134* 10/29/2011 1103   K 3.7 12/02/2011 1053   K 3.5 10/29/2011 1103   CL 100 12/02/2011 1053   CL 91* 10/29/2011 1103   CO2 30 12/02/2011 1053   CO2 33* 10/29/2011 1103   BUN 20 12/02/2011 1053   BUN 18 10/29/2011 1103   CREATININE 0.7 12/02/2011 1053   CREATININE 0.71 10/29/2011 1103      Component Value Date/Time   CALCIUM 9.1 12/02/2011  1053   CALCIUM 8.8 10/29/2011 1103   ALKPHOS 73 12/02/2011 1053   ALKPHOS 79 10/21/2011 0630  AST 30 12/02/2011 1053   AST 22 10/21/2011 0630   ALT 17 10/21/2011 0630   BILITOT 0.80 12/02/2011 1053   BILITOT 0.3 10/21/2011 0630       RADIOGRAPHIC STUDIES: CT CHEST WITHOUT CONTRAST  Technique: Multidetector CT imaging of the chest was performed  following the standard protocol without IV contrast.  Comparison: CT chest of 03/03/2011 and 11/25/2010  Findings: Innumerable pulmonary nodules are present scattered  throughout both lungs. These nodules have minus there are  increased in size, with the largest in the right middle lobe  measuring 8 mm compared 7 mm previously. However there does appear  to have been an increase in number of these nodules very suspicious  for progressive metastatic involvement of the lungs. No pleural  effusion is seen.  On soft tissue window images, no mediastinal or hilar adenopathy is  seen on this unenhanced study. Coronary artery calcifications are  present in cardiomegaly is stable. No definite hepatic lesion is  seen. There are diffuse degenerative changes throughout the  thoracic spine.  IMPRESSION:  1. Multiple pulmonary nodules diffusely throughout the lungs which  appear to have increased somewhat in number in the interval,  worrisome for progression of diffuse metastases.  Original Report Authenticated By: Juline Patch, M.D.   ASSESSMENT: This is a very pleasant 78 years old [place] female with history of a carcinoid tumor of the lung. Status post resection of what montelukast from the left lung. The patient has been observation. CT scan of the chest recently showed multiple myeloma there is increased from the previous scan but the patient had inflammatory process and pneumonia at that time. She is feeling fine today with no significant complaints.  PLAN: I would continue her on observation for now with repeat CT scan of the chest in 6 months. The  patient was advised to call me immediately if she has any concerning symptoms in the interval.   All questions were answered. The patient knows to call the clinic with any problems, questions or concerns. We can certainly see the patient much sooner if necessary.

## 2011-12-13 ENCOUNTER — Other Ambulatory Visit: Payer: Self-pay | Admitting: Cardiovascular Disease

## 2011-12-23 ENCOUNTER — Ambulatory Visit
Admission: RE | Admit: 2011-12-23 | Discharge: 2011-12-23 | Disposition: A | Discharge status: Home / Self Care | Payer: Medicare Other | Source: Ambulatory Visit | Attending: Obstetrics & Gynecology | Admitting: Obstetrics & Gynecology

## 2011-12-23 DIAGNOSIS — Z1231 Encounter for screening mammogram for malignant neoplasm of breast: Secondary | ICD-10-CM

## 2011-12-30 ENCOUNTER — Other Ambulatory Visit: Payer: Self-pay | Admitting: Internal Medicine

## 2011-12-30 DIAGNOSIS — D3A090 Benign carcinoid tumor of the bronchus and lung: Secondary | ICD-10-CM

## 2011-12-30 DIAGNOSIS — D649 Anemia, unspecified: Secondary | ICD-10-CM

## 2012-01-28 ENCOUNTER — Emergency Department (HOSPITAL_COMMUNITY)
Admission: EM | Admit: 2012-01-28 | Discharge: 2012-01-28 | Disposition: A | Discharge status: Home / Self Care | Payer: Medicare Other | Attending: Emergency Medicine | Admitting: Emergency Medicine

## 2012-01-28 ENCOUNTER — Encounter (HOSPITAL_COMMUNITY): Payer: Self-pay | Admitting: *Deleted

## 2012-01-28 DIAGNOSIS — I10 Essential (primary) hypertension: Secondary | ICD-10-CM | POA: Insufficient documentation

## 2012-01-28 DIAGNOSIS — R040 Epistaxis: Secondary | ICD-10-CM

## 2012-01-28 DIAGNOSIS — I359 Nonrheumatic aortic valve disorder, unspecified: Secondary | ICD-10-CM | POA: Insufficient documentation

## 2012-01-28 DIAGNOSIS — J4489 Other specified chronic obstructive pulmonary disease: Secondary | ICD-10-CM | POA: Insufficient documentation

## 2012-01-28 DIAGNOSIS — E7800 Pure hypercholesterolemia, unspecified: Secondary | ICD-10-CM | POA: Insufficient documentation

## 2012-01-28 DIAGNOSIS — E785 Hyperlipidemia, unspecified: Secondary | ICD-10-CM | POA: Insufficient documentation

## 2012-01-28 DIAGNOSIS — E119 Type 2 diabetes mellitus without complications: Secondary | ICD-10-CM | POA: Insufficient documentation

## 2012-01-28 DIAGNOSIS — K219 Gastro-esophageal reflux disease without esophagitis: Secondary | ICD-10-CM | POA: Insufficient documentation

## 2012-01-28 DIAGNOSIS — J449 Chronic obstructive pulmonary disease, unspecified: Secondary | ICD-10-CM | POA: Insufficient documentation

## 2012-01-28 LAB — CBC
Hemoglobin: 12.5 g/dL (ref 12.0–15.0)
MCH: 30.6 pg (ref 26.0–34.0)
MCHC: 33.2 g/dL (ref 30.0–36.0)
Platelets: 221 10*3/uL (ref 150–400)

## 2012-01-28 MED ORDER — OXYMETAZOLINE HCL 0.05 % NA SOLN
2.0000 | Freq: Once | NASAL | Status: AC
  Administered 2012-01-28: 2 via NASAL
  Filled 2012-01-28: qty 15

## 2012-01-28 NOTE — ED Provider Notes (Signed)
History     CSN: [id number]  Arrival date & time 01/28/12  1827   First MD Initiated Contact with Patient 01/28/12 2128      Chief Complaint  Patient presents with  . Epistaxis    (Consider location/radiation/quality/duration/timing/severity/associated sxs/prior treatment) Patient is a 78 y.o. female presenting with nosebleeds. The history is provided by the patient.  Epistaxis  This is a recurrent problem. The current episode started 6 to 12 hours ago. The problem occurs constantly. The problem has been resolved. The problem is associated with an unknown factor. The bleeding has been from the left nare. She has tried applying pressure for the symptoms. The treatment provided significant (resolved) relief. Her past medical history does not include bleeding disorder or frequent nosebleeds.    Past Medical History  Diagnosis Date  . Cancer   . Diabetes mellitus   . Hypertension   . Coronary artery disease   . Aortic insufficiency   . Hyperlipidemia   . Aortic aneurysm   . Hypercholesterolemia   . COPD (chronic obstructive pulmonary disease)   . Bronchitis   . Edema   . GERD (gastroesophageal reflux disease)   . Aortic valve disease     stable  . Pain in joint, forearm   . Pain in joint, hand   . Pain in joint, lower leg   . Difficulty in walking   . Disturbance of skin sensation     Past Surgical History  Procedure Date  . Pars plana vitrectomy     right eye  . Posterior capsulectomy     right eye  . Pars plana vitrectomy w/ endophotocoagulation     right eye  . Fiberoptic bronch with endobronchial u/s 07/02/2010  . Video bronchoscopy 12/09/2009  . Endobronchial excision of right upper lobe tumor with laser bronchi 10/06/2009  . Bronch with endobronchial biopies 09/15/2009    History reviewed. No pertinent family history.  History  Substance Use Topics  . Smoking status: Never Smoker   . Smokeless tobacco: Not on file  . Alcohol Use: No    OB History    Grav Para Term Preterm Abortions TAB SAB Ect Mult Living                  Review of Systems  Constitutional: Negative for fever and chills.  HENT: Positive for nosebleeds. Negative for congestion and rhinorrhea.   Respiratory: Negative for shortness of breath.   Cardiovascular: Negative for chest pain and leg swelling.  Gastrointestinal: Negative for nausea, vomiting, abdominal pain and constipation.  Genitourinary: Negative for urgency, decreased urine volume and difficulty urinating.  Skin: Negative for wound.  Neurological: Positive for light-headedness. Negative for headaches.  Psychiatric/Behavioral: Negative for confusion.  All other systems reviewed and are negative.    Allergies  Review of patient's allergies indicates no known allergies.  Home Medications   Current Outpatient Rx  Name Route Sig Dispense Refill  . ACETAMINOPHEN 325 MG PO TABS Oral Take 325 mg by mouth every 6 (six) hours as needed. For pain    . ALBUTEROL SULFATE HFA 108 (90 BASE) MCG/ACT IN AERS Inhalation Inhale 2 puffs into the lungs every 6 (six) hours as needed. For wheezing    . ASPIRIN 81 MG PO TABS Oral Take 81 mg by mouth daily.      Marland Kitchen HYDROCOD POLST-CPM POLST ER 10-8 MG/5ML PO LQCR Oral Take 5 mLs by mouth as needed. For cough    . INTEGRA PLUS PO CAPS Oral Take  1 capsule by mouth daily.     Marland Kitchen FLUTICASONE PROPIONATE 50 MCG/ACT NA SUSP Nasal Place 2 sprays into the nose daily.      Marland Kitchen GINKGO BILOBA EXTR Oral Take 1 tablet by mouth daily.     Marland Kitchen GLIMEPIRIDE 1 MG PO TABS Oral Take 1 mg by mouth daily.      . INDAPAMIDE 2.5 MG PO TABS Oral Take 2.5 mg by mouth every morning.      . ADULT MULTIVITAMIN W/MINERALS CH Oral Take 1 tablet by mouth daily.    Marland Kitchen NIFEDIPINE ER OSMOTIC 60 MG PO TB24 Oral Take 60 mg by mouth daily.     Marland Kitchen NITROGLYCERIN 0.4 MG SL SUBL Sublingual Place 0.4 mg under the tongue every 5 (five) minutes as needed. For chest pain    . OLMESARTAN MEDOXOMIL 40 MG PO TABS Oral Take 40 mg by  mouth 2 (two) times daily.    Marland Kitchen OMEPRAZOLE 20 MG PO CPDR Oral Take 20 mg by mouth daily.    Marland Kitchen PIOGLITAZONE HCL 45 MG PO TABS Oral Take 45 mg by mouth daily.      Marland Kitchen POTASSIUM CHLORIDE CRYS ER 10 MEQ PO TBCR Oral Take 20 mEq by mouth daily.    Marland Kitchen ROSUVASTATIN CALCIUM 20 MG PO TABS Oral Take 10 mg by mouth daily.      BP 110/67  Pulse 77  Temp 98.9 F (37.2 C)  Resp 18  SpO2 95%  Physical Exam  Constitutional: She is oriented to person, place, and time. She appears well-developed and well-nourished. No distress.  HENT:  Head: Normocephalic and atraumatic.  Right Ear: External ear normal.  Left Ear: External ear normal.  Nose: Nose normal. No septal deviation or nasal septal hematoma. No epistaxis.  Mouth/Throat: Oropharynx is clear and moist.       Very small anterior blood clot to lateral left nare, no active bleeding.  Neck: Neck supple.  Cardiovascular: Normal rate, regular rhythm, normal heart sounds and intact distal pulses.   Pulmonary/Chest: Effort normal and breath sounds normal. No respiratory distress. She has no wheezes. She has no rales.  Abdominal: Soft. She exhibits no distension. There is no tenderness.  Musculoskeletal: She exhibits no edema.  Lymphadenopathy:    She has no cervical adenopathy.  Neurological: She is alert and oriented to person, place, and time.  Skin: Skin is warm and dry. She is not diaphoretic. No pallor.    ED Course  Procedures (including critical care time)   Labs Reviewed  CBC     1. Epistaxis       MDM  78 yo female with recurrent epistaxis, most recent episode a couple hours ago, was controlled with pressure. Only anti-coagulant is 81 mg ASA. Saw an ENT 2 days ago, no procedures done per patient. Source likely anterior left nare, but no intervention needed due to no active bleeding. Patient says she's lightheaded, so CBC ordered, which shows no anemia or thrombocytopenia. Discussed symptomatic Tx and will give afrin, and  encouraged close follow up with her ENT MD. VSS in ED, no tachycardia.         Pricilla Loveless, MD 01/29/12 [phone]

## 2012-01-28 NOTE — ED Notes (Addendum)
To ed for eval of nose bleed since 6pm. Intermittent bleeding. Not bleeding at present

## 2012-01-28 NOTE — ED Notes (Signed)
MD at bedside. 

## 2012-01-28 NOTE — ED Notes (Signed)
Pt d/c home in NAD. Pt ambulated with quick steady gait. Voiced understanding of d/c instructions

## 2012-01-28 NOTE — ED Notes (Signed)
Pt had this Rn speak to daughter. Daughter very upset that pt is not seeing a "nose doctor" and was told to follow up with family dr. Rock Nephew daughter stated that she needs to know why she is bleeding and many reasons were given to her but pt daughter began to yell at this RN stating that the pt would have to come back with each nose bleed she has. MD notified that pt and pt daughter very upset with disposition.

## 2012-01-28 NOTE — Discharge Instructions (Signed)
Nosebleed  A nosebleed can be caused by many things, including:   Getting hit hard in the nose.   Infections.   Dry nose.   Colds.   Medicines.  Your doctor may do lab testing if you get nosebleeds a lot and the cause is not known.  HOME CARE    If your nose was packed with material, keep it there until your doctor takes it out. Put the pack back in your nose if the pack falls out.   Do not blow your nose for 12 hours after the nosebleed.   Sit up and bend forward if your nose starts bleeding again. Pinch the front half of your nose nonstop for 20 minutes.   Put petroleum jelly inside your nose every morning if you have a dry nose.   Use a humidifier to make the air less dry.   Do not take aspirin.   Try not to strain, lift, or bend at the waist for many days after the nosebleed.  GET HELP RIGHT AWAY IF:    Nosebleeds keep happening and are hard to stop or control.   You have bleeding or bruises that are not normal on other parts of the body.   You have a fever.   The nosebleeds get worse.   You get lightheaded, feel faint, sweaty, or throw up (vomit) blood.  MAKE SURE YOU:    Understand these instructions.   Will watch your condition.   Will get help right away if you are not doing well or get worse.  Document Released: 07/20/2008 Document Revised: 09/30/2011 Document Reviewed: 07/20/2008  [employer] Patient Information 2012 [employer].

## 2012-01-28 NOTE — ED Notes (Signed)
MD at bedside with Pt.

## 2012-02-02 NOTE — ED Provider Notes (Signed)
I saw and evaluated the patient, reviewed the resident's note and I agree with the findings and plan.   .Face to face Exam:  General:  Awake HEENT:  Atraumatic Resp:  Normal effort Abd:  Nondistended Neuro:No focal weakness Lymph: No adenopathy   Nelia Shi, MD 02/02/12 2200

## 2012-02-10 ENCOUNTER — Other Ambulatory Visit: Payer: Self-pay | Admitting: Cardiovascular Disease

## 2012-02-10 NOTE — Telephone Encounter (Signed)
Refilled generic lozol

## 2012-02-26 ENCOUNTER — Encounter (HOSPITAL_COMMUNITY): Payer: Self-pay

## 2012-02-26 ENCOUNTER — Emergency Department (HOSPITAL_COMMUNITY): Payer: Medicare Other

## 2012-02-26 ENCOUNTER — Emergency Department (HOSPITAL_COMMUNITY)
Admission: EM | Admit: 2012-02-26 | Discharge: 2012-02-26 | Disposition: A | Discharge status: Home / Self Care | Payer: Medicare Other | Attending: Emergency Medicine | Admitting: Emergency Medicine

## 2012-02-26 DIAGNOSIS — E785 Hyperlipidemia, unspecified: Secondary | ICD-10-CM | POA: Insufficient documentation

## 2012-02-26 DIAGNOSIS — R05 Cough: Secondary | ICD-10-CM | POA: Insufficient documentation

## 2012-02-26 DIAGNOSIS — I2510 Atherosclerotic heart disease of native coronary artery without angina pectoris: Secondary | ICD-10-CM | POA: Insufficient documentation

## 2012-02-26 DIAGNOSIS — K219 Gastro-esophageal reflux disease without esophagitis: Secondary | ICD-10-CM | POA: Insufficient documentation

## 2012-02-26 DIAGNOSIS — J189 Pneumonia, unspecified organism: Secondary | ICD-10-CM | POA: Insufficient documentation

## 2012-02-26 DIAGNOSIS — E7800 Pure hypercholesterolemia, unspecified: Secondary | ICD-10-CM | POA: Insufficient documentation

## 2012-02-26 DIAGNOSIS — J449 Chronic obstructive pulmonary disease, unspecified: Secondary | ICD-10-CM | POA: Insufficient documentation

## 2012-02-26 DIAGNOSIS — E119 Type 2 diabetes mellitus without complications: Secondary | ICD-10-CM | POA: Insufficient documentation

## 2012-02-26 DIAGNOSIS — Z79899 Other long term (current) drug therapy: Secondary | ICD-10-CM | POA: Insufficient documentation

## 2012-02-26 DIAGNOSIS — Z7982 Long term (current) use of aspirin: Secondary | ICD-10-CM | POA: Insufficient documentation

## 2012-02-26 DIAGNOSIS — J4489 Other specified chronic obstructive pulmonary disease: Secondary | ICD-10-CM | POA: Insufficient documentation

## 2012-02-26 DIAGNOSIS — R059 Cough, unspecified: Secondary | ICD-10-CM | POA: Insufficient documentation

## 2012-02-26 DIAGNOSIS — I10 Essential (primary) hypertension: Secondary | ICD-10-CM | POA: Insufficient documentation

## 2012-02-26 MED ORDER — AZITHROMYCIN 250 MG PO TABS: 250.0000 mg | ORAL_TABLET | Freq: Every day | ORAL | Status: AC

## 2012-02-26 MED ORDER — AZITHROMYCIN 250 MG PO TABS
500.0000 mg | ORAL_TABLET | Freq: Once | ORAL | Status: AC
  Administered 2012-02-26: 500 mg via ORAL
  Filled 2012-02-26 (×2): qty 2

## 2012-02-26 NOTE — ED Notes (Signed)
Cough and fever for 2 weeks

## 2012-02-26 NOTE — ED Provider Notes (Addendum)
History   This chart was scribed for Nat Christen, MD by Melba Coon. The patient was seen in room STRE1/STRE1 and the patient's care was started at 1:27PM.    CSN: [id number]  Arrival date & time 02/26/12  1249   First MD Initiated Contact with Patient 02/26/12 1325      Chief Complaint  Patient presents with  . Cough    (Consider location/radiation/quality/duration/timing/severity/associated sxs/prior treatment) HPI [name] is a 78 y.o. female who presents to the Emergency Department complaining of persistent, moderate to severe productive cough with an onset 2 weeks ago. White phlegm. Pt uses an inhaler which slightly alleviates her symptoms. Subjective chills present. No HA, fever, neck pain, sore throat, rash, back pain, CP, SOB, abd pain, n/v/d, dysuria, or extremity pain, edema, weakness, numbness, or tingling. Hx of bronchitis and COPD. No known allergies. No other pertinent medical symptoms. Non-smoker.   Past Medical History  Diagnosis Date  . Cancer   . Diabetes mellitus   . Hypertension   . Coronary artery disease   . Aortic insufficiency   . Hyperlipidemia   . Aortic aneurysm   . Hypercholesterolemia   . COPD (chronic obstructive pulmonary disease)   . Bronchitis   . Edema   . GERD (gastroesophageal reflux disease)   . Aortic valve disease     stable  . Pain in joint, forearm   . Pain in joint, hand   . Pain in joint, lower leg   . Difficulty in walking   . Disturbance of skin sensation     Past Surgical History  Procedure Date  . Pars plana vitrectomy     right eye  . Posterior capsulectomy     right eye  . Pars plana vitrectomy w/ endophotocoagulation     right eye  . Fiberoptic bronch with endobronchial u/s 07/02/2010  . Video bronchoscopy 12/09/2009  . Endobronchial excision of right upper lobe tumor with laser bronchi 10/06/2009  . Bronch with endobronchial biopies 09/15/2009    No family history on file.  History  Substance  Use Topics  . Smoking status: Never Smoker   . Smokeless tobacco: Not on file  . Alcohol Use: No    OB History    Grav Para Term Preterm Abortions TAB SAB Ect Mult Living                  Review of Systems 10 Systems reviewed and all are negative for acute change except as noted in the HPI.   Allergies  Review of patient's allergies indicates no known allergies.  Home Medications   Current Outpatient Rx  Name Route Sig Dispense Refill  . ACETAMINOPHEN 325 MG PO TABS Oral Take 325 mg by mouth every 6 (six) hours as needed. For pain    . ALBUTEROL SULFATE HFA 108 (90 BASE) MCG/ACT IN AERS Inhalation Inhale 2 puffs into the lungs every 6 (six) hours as needed. For wheezing    . ASPIRIN 81 MG PO TABS Oral Take 81 mg by mouth daily.      Arnette Schaumann PLUS PO CAPS Oral Take 1 capsule by mouth daily.     Marland Kitchen FLUTICASONE PROPIONATE 50 MCG/ACT NA SUSP Nasal Place 2 sprays into the nose daily.      Marland Kitchen GINKGO BILOBA EXTR Oral Take 1 tablet by mouth daily.     Marland Kitchen GLIMEPIRIDE 1 MG PO TABS Oral Take 1 mg by mouth daily.      . INDAPAMIDE 2.5  MG PO TABS Oral Take 2.5 mg by mouth every morning.      . ADULT MULTIVITAMIN W/MINERALS CH Oral Take 1 tablet by mouth daily.    Marland Kitchen NIFEDIPINE ER OSMOTIC 60 MG PO TB24 Oral Take 60 mg by mouth daily.     Marland Kitchen NITROGLYCERIN 0.4 MG SL SUBL Sublingual Place 0.4 mg under the tongue every 5 (five) minutes as needed. For chest pain    . OLMESARTAN MEDOXOMIL 40 MG PO TABS Oral Take 40 mg by mouth 2 (two) times daily.    Marland Kitchen OMEPRAZOLE 20 MG PO CPDR Oral Take 20 mg by mouth daily.    Marland Kitchen PIOGLITAZONE HCL 45 MG PO TABS Oral Take 45 mg by mouth daily.      Marland Kitchen POTASSIUM CHLORIDE CRYS ER 10 MEQ PO TBCR Oral Take 20 mEq by mouth daily.    Marland Kitchen ROSUVASTATIN CALCIUM 20 MG PO TABS Oral Take 10 mg by mouth daily.      BP 146/69  Pulse 64  Temp(Src) 98.7 F (37.1 C) (Oral)  Resp 22  SpO2 95%  Physical Exam  Nursing note and vitals reviewed. Constitutional: She is oriented to  person, place, and time. She appears well-developed and well-nourished. No distress.  HENT:  Head: Normocephalic and atraumatic.  Mouth/Throat: Oropharynx is clear and moist. No oropharyngeal exudate.       No oropharyngeal erythema  Eyes: EOM are normal. Pupils are equal, round, and reactive to light.  Neck: Normal range of motion. Neck supple. No tracheal deviation present.  Cardiovascular: Normal rate.   No murmur heard. Pulmonary/Chest: Effort normal and breath sounds normal. No respiratory distress. She has no wheezes. She has no rales.       Lungs are clear  Abdominal: Soft. There is no tenderness.  Musculoskeletal: Normal range of motion. She exhibits no edema and no tenderness.  Neurological: She is alert and oriented to person, place, and time.  Skin: Skin is warm and dry.  Psychiatric: She has a normal mood and affect. Her behavior is normal.    ED Course  Procedures (including critical care time)  DIAGNOSTIC STUDIES: Oxygen Saturation is 95% on room air, adequate by my interpretation.    COORDINATION OF CARE:  1:31PM - EDMD will order CXR for the pt.  Dg Chest 2 View  02/26/2012  *RADIOLOGY REPORT*  Clinical Data: 2-week history of productive cough.  History of COPD, diabetes, hypertension, and coronary artery disease.  CHEST - 2 VIEW 02/26/2012:  Comparison: Two-view chest x-ray 10/29/2011, 10/20/2011, 09/30/2010.  Findings: Cardiac silhouette markedly enlarged but stable. Thoracic aorta tortuous and atherosclerotic, unchanged.  Hilar and mediastinal contours otherwise unremarkable.  Chronic scar and bronchiectasis in the right middle lobe.  New patchy airspace opacities in the right lower lobe.  No new pulmonary parenchymal abnormalities elsewhere.  Pulmonary vascularity normal.  No pleural effusions.  Degenerative changes and DISH involving the thoracic spine.  IMPRESSION: Acute right lower lobe pneumonia.  Chronic scar and bronchiectasis in the right middle lobe.  Stable  marked cardiomegaly without pulmonary edema.  Original Report Authenticated By: Arnell Sieving, M.D.      MDM  Patient with no wheezing or decreased breath sounds on exam to demonstrate an acute COPD exacerbation.  Patient has normal vital signs here.  I will obtain a chest x-ray since the patient has had persistent cough for the last 2 weeks.  Patient otherwise appears well.  I anticipate her discharge home with antibiotics if there is an infiltrate  on her chest x-ray.   I personally performed the services described in this documentation, which was scribed in my presence. The recorded information has been reviewed and considered.        Nat Christen, MD 02/26/12 1342  Nat Christen, MD 02/26/12 [phone]

## 2012-02-26 NOTE — Discharge Instructions (Signed)
Pneumonia, Adult Pneumonia is an infection of the lungs.  CAUSES Pneumonia may be caused by bacteria or a virus. Usually, these infections are caused by breathing infectious particles into the lungs (respiratory tract). SYMPTOMS   Cough.   Fever.   Chest pain.   Increased rate of breathing.   Wheezing.   Mucus production.  DIAGNOSIS  If you have the common symptoms of pneumonia, your caregiver will typically confirm the diagnosis with a chest X-ray. The X-ray will show an abnormality in the lung (pulmonary infiltrate) if you have pneumonia. Other tests of your blood, urine, or sputum may be done to find the specific cause of your pneumonia. Your caregiver may also do tests (blood gases or pulse oximetry) to see how well your lungs are working. TREATMENT  Some forms of pneumonia may be spread to other people when you cough or sneeze. You may be asked to wear a mask before and during your exam. Pneumonia that is caused by bacteria is treated with antibiotic medicine. Pneumonia that is caused by the influenza virus may be treated with an antiviral medicine. Most other viral infections must run their course. These infections will not respond to antibiotics.  PREVENTION A pneumococcal shot (vaccine) is available to prevent a common bacterial cause of pneumonia. This is usually suggested for:  People over 65 years old.   Patients on chemotherapy.   People with chronic lung problems, such as bronchitis or emphysema.   People with immune system problems.  If you are over 65 or have a high risk condition, you may receive the pneumococcal vaccine if you have not received it before. In some countries, a routine influenza vaccine is also recommended. This vaccine can help prevent some cases of pneumonia.You may be offered the influenza vaccine as part of your care. If you smoke, it is time to quit. You may receive instructions on how to stop smoking. Your caregiver can provide medicines and  counseling to help you quit. HOME CARE INSTRUCTIONS   Cough suppressants may be used if you are losing too much rest. However, coughing protects you by clearing your lungs. You should avoid using cough suppressants if you can.   Your caregiver may have prescribed medicine if he or she thinks your pneumonia is caused by a bacteria or influenza. Finish your medicine even if you start to feel better.   Your caregiver may also prescribe an expectorant. This loosens the mucus to be coughed up.   Only take over-the-counter or prescription medicines for pain, discomfort, or fever as directed by your caregiver.   Do not smoke. Smoking is a common cause of bronchitis and can contribute to pneumonia. If you are a smoker and continue to smoke, your cough may last several weeks after your pneumonia has cleared.   A cold steam vaporizer or humidifier in your room or home may help loosen mucus.   Coughing is often worse at night. Sleeping in a semi-upright position in a recliner or using a couple pillows under your head will help with this.   Get rest as you feel it is needed. Your body will usually let you know when you need to rest.  SEEK IMMEDIATE MEDICAL CARE IF:   Your illness becomes worse. This is especially true if you are elderly or weakened from any other disease.   You cannot control your cough with suppressants and are losing sleep.   You begin coughing up blood.   You develop pain which is getting worse or   is uncontrolled with medicines.   You have a fever.   Any of the symptoms which initially brought you in for treatment are getting worse rather than better.   You develop shortness of breath or chest pain.  MAKE SURE YOU:   Understand these instructions.   Will watch your condition.   Will get help right away if you are not doing well or get worse.  Document Released: 10/11/2005 Document Revised: 09/30/2011 Document Reviewed: 12/31/2010 [facility] Patient Information 2012  [employer], [place].  ????????? ? ???????? (Pneumonia, Adult) ????????? - ???????????? ??????????? ??????.  ??????? ??????????? - ???????? ??? ??????. ?????? ???????? ?????????? ????? ????????? ???????? ? ?????? ??? ??????? (??????????? ????). ????????   ??????.   ?????????? ???????????.   ???? ? ?????.   ????????? ???????.   ????????? ???????.   ????????? ???????.  ???????  ???? ? ??? ???????????? ??????? ???????? ?????????, ??? ??????? ????, ??? ???????, ???????? ???????-?????? ??? ????????????? ????????. ???????-?????? ??????? ??????????? ???????? ? ?????? (???????? ??????????), ???? ? ??? ????????????? ?????????. ?????? ??????? - ?????, ???? ??? ??????? - ????? ???? ????????? ??? ????, ????? ?????????? ?????????? ??????? ?????????. ??? ??????? ???? ????? ????????? ???????????? (??????????? ????????????? ? ????? ????????? ??? ???????????? ? ???????????????), ????? ??????????, ????????? ?????? ???????? ???? ??????. ??????? ????????? ???? ????????? ????? ???????????? ?????? ????? ????????-????????? ????? (????? ?? ???????? ??? ???????). ??? ????? ????????????? ??????? ???????? ????? ?? ?? ????? ????????????. ?????????, ????????? ????????????? ?????????, ??????? ??????? ????????????. ?????????, ????????? ??????? ??????, ????? ?????? ????????????? ???????????. ??????????? ?????? ????? ???????? ???????? ?????? ?????? ????. ???????? ???????? ?? ????????? ?? ???????????.  ???????????? ?????????????? ??????? ????? ???? ????????? ??? ?????????????? ??????????? ????????????? ????????. ??? ?????? ????????:  ????????? ?????? 65.   ?????????, ??????? ???????? ???? ????????????.   ??????? ? ???????????? ????????????? ??????, ????????, ????????? ??? ?????????.   ????????? ? ???????????? ???????? ???????.  ???? ?? ?????? 65 ??? ? ??? ???? ???? ??????? ?????, ??? ????? ??????? ???????? ?? ?????????, ???? ??? ?? ???? ??????? ?????. ? ????????? ??????? ????????????? ???????????? ?????????? ?? ?????????.  ??? ??????? ????? ?? ????????? ???????? ????????? ????? ?????????.??? ????? ??????? ??? ????? ??????? ???????? ?? ??????.  ???? ?? ??????, ?????????? ???????. ??? ????? ??????? ??????????????? ??????, ??? ????? ??????? ??????. ??? ??????? ???? ????? ????????? ????????? ? ??????????????????, ??? ????? ??????? ??????. ???????????? ?? ????? ? ???????? ????????  ???? ?? ?? ?????? ????? ??-?? ??????? ????????? ?????, ??? ????? ????????? ????????? ??? ?????????? ?????. ??????? ???????, ??????, ??? ?????? - ??? ???????? ???????? ???????? ??? ??????? ?????? ?? ???????????? ??????. ?? ???? ??????????? ??????? ???????? ?????? ?????????? ?? ?????.   ??? ??????? ???? ????? ????????? ????? ?????????????? ????????? ? ??????????? ?? ????, ???, ?? ???/?? ??????, ??????? ????????? - ????????????? ???????? ??? ????? ??????. ?? ?????? ????????? ??????????? ???? ???????????????? ???????, ???? ???? ?? ?????????? ???? ?????.   ??? ????? ????? ????????? ???????? ??? ????????????. ??? ????????? ???????, ????? ??????? ??? ????? ?????????????.   ?????? ???????? ??????????? ??? ????????? ??????????? ??? ????, ???????????, ??? ????????? ??? ?????????? ????? ????.   ????????. ??????? - ???????????????? ??????? ???????? ? ????? ????? ????????????? ???????? ???????? ?????????. ???? ?? ?????? ? ??????????? ??????, ?????? ? ??? ????? ??????? ????????? ?????? ????? ????????? ?????????.   ????????? ????????? ???? ??? ??????????? ??????? ? ??? ? ??????? ????? ?????????????? ????? ???????? ?????? ???????.   ?????? ????? ??????????? ?????. ??? ? ????????? ???????? ??? ? ???????????? ????????? ????? ????????? ?????? ??????.   ????????? ?? ???? ?????????????. ??? ???????? ?????? ??? ?????, ????? ??? ????????? ?????.  ?????????? ?????????? ? ?????, ????:  ???? ????????? ??????????. ???????? ????????????? ? ????????? ???? ????????? ???? ???? ????????? ???????? ???????? ??? ???, ??? ???????? ????????? ????? ?????????????.   ????????  ??????, ????? ???????????? ??????? ?? ????? ???????, ?? ?? ?????? ??????? ??-?? ???????? ?????.   ??? ???????????? ? ???????? ???? ?????.   ????????? ????, ????? ?????????????? ?? ??????????.   ? ??? ?????????? ???????????.   ?????????? ????????, ??????? ???????? ??? ?????????? ? ????? ??????????, ??? ??? ????????????? ????????.   ? ??? ???????? ???????? ??????, ??? ????????? ???? ? ?????.  ?? ??????:  ?? ????????? ?????? ?????????? ?? ????? ????? ???????.   ?????? ?????????????? ????????? ????? ??????? ?? ????? ??????????.   ?????????? ?????????? ?? ??????????? ??????? ???????? ????????????.  Document Released: 10/11/2005 Document Revised: 09/30/2011 [facility] Patient Information 2012 [place], [place].

## 2012-02-26 NOTE — ED Notes (Signed)
Discharged with family with instructions and prescription. NAD at time of discharge.

## 2012-02-26 NOTE — ED Notes (Signed)
Patient transported to X-ray 

## 2012-03-02 ENCOUNTER — Other Ambulatory Visit: Payer: Self-pay | Admitting: Thoracic Surgery

## 2012-03-02 DIAGNOSIS — C3410 Malignant neoplasm of upper lobe, unspecified bronchus or lung: Secondary | ICD-10-CM

## 2012-03-04 ENCOUNTER — Emergency Department (HOSPITAL_COMMUNITY)
Admission: EM | Admit: 2012-03-04 | Discharge: 2012-03-04 | Disposition: A | Discharge status: Home / Self Care | Payer: Medicare Other | Attending: Emergency Medicine | Admitting: Emergency Medicine

## 2012-03-04 ENCOUNTER — Encounter (HOSPITAL_COMMUNITY): Payer: Self-pay | Admitting: Emergency Medicine

## 2012-03-04 ENCOUNTER — Emergency Department (HOSPITAL_COMMUNITY): Payer: Medicare Other

## 2012-03-04 DIAGNOSIS — I2510 Atherosclerotic heart disease of native coronary artery without angina pectoris: Secondary | ICD-10-CM | POA: Insufficient documentation

## 2012-03-04 DIAGNOSIS — E785 Hyperlipidemia, unspecified: Secondary | ICD-10-CM | POA: Insufficient documentation

## 2012-03-04 DIAGNOSIS — C3490 Malignant neoplasm of unspecified part of unspecified bronchus or lung: Secondary | ICD-10-CM

## 2012-03-04 DIAGNOSIS — I359 Nonrheumatic aortic valve disorder, unspecified: Secondary | ICD-10-CM | POA: Insufficient documentation

## 2012-03-04 DIAGNOSIS — Z79899 Other long term (current) drug therapy: Secondary | ICD-10-CM | POA: Insufficient documentation

## 2012-03-04 DIAGNOSIS — K219 Gastro-esophageal reflux disease without esophagitis: Secondary | ICD-10-CM | POA: Insufficient documentation

## 2012-03-04 DIAGNOSIS — I10 Essential (primary) hypertension: Secondary | ICD-10-CM | POA: Insufficient documentation

## 2012-03-04 DIAGNOSIS — J189 Pneumonia, unspecified organism: Secondary | ICD-10-CM | POA: Insufficient documentation

## 2012-03-04 DIAGNOSIS — J4489 Other specified chronic obstructive pulmonary disease: Secondary | ICD-10-CM | POA: Insufficient documentation

## 2012-03-04 DIAGNOSIS — J449 Chronic obstructive pulmonary disease, unspecified: Secondary | ICD-10-CM | POA: Insufficient documentation

## 2012-03-04 DIAGNOSIS — R000 Tachycardia, unspecified: Secondary | ICD-10-CM | POA: Insufficient documentation

## 2012-03-04 DIAGNOSIS — E119 Type 2 diabetes mellitus without complications: Secondary | ICD-10-CM | POA: Insufficient documentation

## 2012-03-04 MED ORDER — AMOXICILLIN 500 MG PO TABS: 1000.0000 mg | ORAL_TABLET | Freq: Two times a day (BID) | ORAL | Status: AC

## 2012-03-04 NOTE — ED Notes (Addendum)
Patient's daughter states the patient has had non-productive cough x 1 week. Denies N/V/F/D./SOB. Patient does not speak english, daughter interprets.

## 2012-03-04 NOTE — Discharge Instructions (Signed)
Pneumonia, Adult Pneumonia is an infection of the lungs. It may be caused by a germ (virus or bacteria). Some types of pneumonia can spread easily from person to person. This can happen when you cough or sneeze. HOME CARE  Only take medicine as told by your doctor.   Take your medicine (antibiotics) as told. Finish it even if you start to feel better.   Do not smoke.   You may use a vaporizer or humidifier in your room. This can help loosen thick spit (mucus).   Sleep so you are almost sitting up (semi-upright). This helps reduce coughing.   Rest.  A shot (vaccine) can help prevent pneumonia. Shots are often advised for:  People over 65 years old.   Patients on chemotherapy.   People with long-term (chronic) lung problems.   People with immune system problems.  GET HELP RIGHT AWAY IF:   You are getting worse.   You cannot control your cough, and you are losing sleep.   You cough up blood.   Your pain gets worse, even with medicine.   You have a fever.   Any of your problems are getting worse, not better.   You have shortness of breath or chest pain.  MAKE SURE YOU:   Understand these instructions.   Will watch your condition.   Will get help right away if you are not doing well or get worse.  Document Released: 03/29/2008 Document Revised: 09/30/2011 Document Reviewed: 01/01/2011 [employer] Patient Information 2012 [employer]  RETURN IMMEDIATELY IF you develop shortness of breath, confusion or altered mental status, a new rash, become dizzy, faint, or poorly responsive, or are unable to be cared for at home. 

## 2012-03-04 NOTE — ED Notes (Signed)
Patient transported to X-ray 

## 2012-03-04 NOTE — ED Provider Notes (Signed)
History   This chart was scribed for Hurman Horn, MD scribed by Magnus Sinning. The patient was seen in room STRE5/STRE5 seen at 13:.   CSN: [id number]  Arrival date & time 03/04/12  1209   None     Chief Complaint  Patient presents with  . Croup    (Consider location/radiation/quality/duration/timing/severity/associated sxs/prior treatment) HPI [name] is a 78 y.o. female who presents to the Emergency Department complaining of persistent moderate cough, onset a few weeks. Per report, she has been coughing for a few weeks and was seen and treated 7 days ago for pneumonia with zithromax. Hx of COPD and uses an inhaler at home. Denies fever,wheezing, dizziness, rash ,n/v/d, or confusion. Past Medical History  Diagnosis Date  . Cancer   . Diabetes mellitus   . Hypertension   . Coronary artery disease   . Aortic insufficiency   . Hyperlipidemia   . Aortic aneurysm   . Hypercholesterolemia   . COPD (chronic obstructive pulmonary disease)   . Bronchitis   . Edema   . GERD (gastroesophageal reflux disease)   . Aortic valve disease     stable  . Pain in joint, forearm   . Pain in joint, hand   . Pain in joint, lower leg   . Difficulty in walking   . Disturbance of skin sensation     Past Surgical History  Procedure Date  . Pars plana vitrectomy     right eye  . Posterior capsulectomy     right eye  . Pars plana vitrectomy w/ endophotocoagulation     right eye  . Fiberoptic bronch with endobronchial u/s 07/02/2010  . Video bronchoscopy 12/09/2009  . Endobronchial excision of right upper lobe tumor with laser bronchi 10/06/2009  . Bronch with endobronchial biopies 09/15/2009    No family history on file.  History  Substance Use Topics  . Smoking status: Never Smoker   . Smokeless tobacco: Not on file  . Alcohol Use: No   Review of Systems  Constitutional: Negative for fever.       10 Systems reviewed and are negative for acute change except as noted in  the HPI.  HENT: Negative for congestion.   Eyes: Negative for discharge and redness.  Respiratory: Positive for cough. Negative for shortness of breath.   Cardiovascular: Negative for chest pain.  Gastrointestinal: Negative for vomiting and abdominal pain.  Musculoskeletal: Negative for back pain.  Skin: Negative for rash.  Neurological: Negative for syncope, numbness and headaches.  Psychiatric/Behavioral:       No behavior change.  All other systems reviewed and are negative.    Allergies  Review of patient's allergies indicates no known allergies.  Home Medications   Current Outpatient Rx  Name Route Sig Dispense Refill  . ACETAMINOPHEN 325 MG PO TABS Oral Take 325 mg by mouth every 6 (six) hours as needed. For pain    . ALBUTEROL SULFATE HFA 108 (90 BASE) MCG/ACT IN AERS Inhalation Inhale 2 puffs into the lungs every 6 (six) hours as needed. For wheezing    . ASPIRIN 81 MG PO TABS Oral Take 81 mg by mouth daily.      Marland Kitchen BENZONATATE 100 MG PO CAPS Oral Take 100 mg by mouth 3 (three) times daily as needed. For cough    . INTEGRA PLUS PO CAPS Oral Take 1 capsule by mouth daily.     Marland Kitchen FLUTICASONE PROPIONATE 50 MCG/ACT NA SUSP Nasal Place 2 sprays into the  nose daily.      Marland Kitchen GINKGO BILOBA EXTR Oral Take 1 tablet by mouth daily.     Marland Kitchen GLIMEPIRIDE 1 MG PO TABS Oral Take 1 mg by mouth daily.      . INDAPAMIDE 2.5 MG PO TABS Oral Take 2.5 mg by mouth every morning.      . ADULT MULTIVITAMIN W/MINERALS CH Oral Take 1 tablet by mouth daily.    Marland Kitchen NIFEDIPINE ER OSMOTIC 60 MG PO TB24 Oral Take 60 mg by mouth daily.     Marland Kitchen NITROGLYCERIN 0.4 MG SL SUBL Sublingual Place 0.4 mg under the tongue every 5 (five) minutes as needed. For chest pain    . OLMESARTAN MEDOXOMIL 40 MG PO TABS Oral Take 40 mg by mouth 2 (two) times daily.    Marland Kitchen OMEPRAZOLE 20 MG PO CPDR Oral Take 20 mg by mouth daily.    Marland Kitchen PIOGLITAZONE HCL 45 MG PO TABS Oral Take 45 mg by mouth daily.      Marland Kitchen POTASSIUM CHLORIDE CRYS ER 10 MEQ  PO TBCR Oral Take 20 mEq by mouth daily.    Marland Kitchen ROSUVASTATIN CALCIUM 20 MG PO TABS Oral Take 10 mg by mouth daily.    . AMOXICILLIN 500 MG PO TABS Oral Take 2 tablets (1,000 mg total) by mouth 2 (two) times daily. 28 tablet 0    BP 119/56  Pulse 73  Temp 98.1 F (36.7 C)  Resp 22  SpO2 93%  Physical Exam  Nursing note and vitals reviewed. Constitutional:       Awake, alert, nontoxic appearance.  HENT:  Head: Atraumatic.  Mouth/Throat: Oropharynx is clear and moist.  Eyes: Right eye exhibits no discharge. Left eye exhibits no discharge.  Neck: Neck supple.  Cardiovascular: Regular rhythm.  Tachycardia present.   No murmur heard.      Mild tachycardia.  Pulmonary/Chest: Effort normal. She has no wheezes. She has no rales. She exhibits no tenderness.       Lungs clear  Abdominal: Soft. There is no tenderness. There is no rebound.  Musculoskeletal: She exhibits no tenderness.       Baseline ROM, no obvious new focal weakness.  Neurological:       Mental status and motor strength appears baseline for patient and situation.  Skin: No rash noted.  Psychiatric: She has a normal mood and affect.    ED Course  Procedures (including critical care time) DIAGNOSTIC STUDIES: Oxygen Saturation is 93% on adequate, normal by my interpretation.    COORDINATION OF CARE: 14:50: EDMD informs patient of radiology results and notes that pneumonia has improved since last visit. EDMD notifies patient to expect cough for the next few weeks and to follow-up with a chest x-ray with physician. Plans to d/c with prescription for amoxil with recommendations to complete Zithromax. Patient and family agree with the plan of action set at this time.   Dg Chest 2 View  03/04/2012  *RADIOLOGY REPORT*  Clinical Data: Cough, right lower lobe pneumonia  CHEST - 2 VIEW  Comparison: Chest radiograph 02/2012, CT 09/28/2011  Findings: Stable enlarged heart silhouette with ectatic aorta. There is nodular air space  disease in the right lower lobe which is improved compared to prior.  There is a background of bilateral pulmonary nodules which is not unchanged from CT 09/28/2011.  There is chronic left lower lobe atelectasis.  IMPRESSION:  1.  Mild improvement in the right lower lobe air space disease. 2.  Bilateral pulmonary nodules again demonstrated  concerning for pulmonary metastasis. 3.  Left lower lobe atelectasis.  Original Report Authenticated By: Genevive Bi, M.D.     1. CAP (community acquired pneumonia)   2. Lung cancer       MDM   I personally performed the services described in this documentation, which was scribed in my presence. The recorded information has been reviewed and considered. Pt stable in ED with no significant deterioration in condition.Patient / Family / Caregiver informed of clinical course, understand medical decision-making process, and agree with plan.I doubt any other EMC precluding discharge at this time including, but not necessarily limited to the following:sepsis.     Hurman Horn, MD 03/05/12 [phone]

## 2012-03-04 NOTE — ED Notes (Signed)
Daughter stated, she's been coughing for 5 days, she might have pneumonia

## 2012-04-12 ENCOUNTER — Ambulatory Visit
Admission: RE | Admit: 2012-04-12 | Discharge: 2012-04-12 | Disposition: A | Discharge status: Home / Self Care | Payer: Medicare Other | Source: Ambulatory Visit | Attending: Thoracic Surgery | Admitting: Thoracic Surgery

## 2012-04-12 ENCOUNTER — Encounter: Payer: Self-pay | Admitting: Thoracic Surgery

## 2012-04-12 ENCOUNTER — Ambulatory Visit (INDEPENDENT_AMBULATORY_CARE_PROVIDER_SITE_OTHER): Payer: Medicare Other | Admitting: Thoracic Surgery

## 2012-04-12 VITALS — BP 151/79 | HR 82 | Resp 20 | Ht 61.0 in | Wt 172.0 lb

## 2012-04-12 DIAGNOSIS — C3410 Malignant neoplasm of upper lobe, unspecified bronchus or lung: Secondary | ICD-10-CM

## 2012-04-12 NOTE — Progress Notes (Signed)
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 She had a recent bout of pneumonia in the right lung. CT scan today showed multiple nodules the right lower lobe and left lower lobe and less nodules in the right upper lobe and right middle and the left upper lobe. Do not have the final reading but the nodules appear to be stable. There is no adenopathy and no endobronchial lesions. She is stable overall. She knows that I am retiring. She'll be followed up by Dr. Dorris Fetch. We will see her again in 6 months with another CT scan   Current Outpatient Prescriptions  Medication Sig Dispense Refill  . acetaminophen (TYLENOL) 325 MG tablet Take 325 mg by mouth every 6 (six) hours as needed. For pain      . albuterol (PROVENTIL HFA) 108 (90 BASE) MCG/ACT inhaler Inhale 2 puffs into the lungs every 6 (six) hours as needed. For wheezing      . aspirin 81 MG tablet Take 81 mg by mouth daily.        . benzonatate (TESSALON) 100 MG capsule Take 100 mg by mouth 3 (three) times daily as needed. For cough      . FeFum-FePoly-FA-B Cmp-C-Biot (INTEGRA PLUS) CAPS Take 1 capsule by mouth daily.       . fluticasone (FLONASE) 50 MCG/ACT nasal spray Place 2 sprays into the nose daily.        . Ginkgo Biloba EXTR Take 1 tablet by mouth daily.       Marland Kitchen glimepiride (AMARYL) 1 MG tablet Take 1 mg by mouth daily.        . indapamide (LOZOL) 2.5 MG tablet Take 2.5 mg by mouth every morning.        . Multiple Vitamin (MULITIVITAMIN WITH MINERALS) TABS Take 1 tablet by mouth daily.      Marland Kitchen NIFEdipine (PROCARDIA XL/ADALAT-CC) 60 MG 24 hr tablet Take 60 mg by mouth daily.       . nitroGLYCERIN (NITROSTAT) 0.4 MG SL tablet Place 0.4 mg under the tongue every 5 (five) minutes as needed. For chest pain      . olmesartan (BENICAR) 40 MG tablet Take 40 mg by mouth 2 (two) times daily.      Marland Kitchen omeprazole (PRILOSEC) 20 MG capsule Take 20 mg by mouth daily.      . pioglitazone (ACTOS) 45 MG tablet Take 45 mg by mouth daily.         . potassium chloride (K-DUR,KLOR-CON) 10 MEQ tablet Take 20 mEq by mouth daily.      . rosuvastatin (CRESTOR) 20 MG tablet Take 10 mg by mouth daily.         Review of Systems: Recent bout of pneumonia   Physical Exam lungs are clear auscultation and percussion Diagnostic Tests: CT scan showed no major changes in the multiple nodules   Impression: Multiple carcinoid syndrome stable   Plan: Return in 6 months with CT scan. Followup Dr. Dorris Fetch

## 2012-05-11 DIAGNOSIS — L304 Erythema intertrigo: Secondary | ICD-10-CM | POA: Insufficient documentation

## 2012-05-11 DIAGNOSIS — L821 Other seborrheic keratosis: Secondary | ICD-10-CM | POA: Insufficient documentation

## 2012-05-31 ENCOUNTER — Telehealth: Payer: Self-pay | Admitting: Medical Oncology

## 2012-05-31 NOTE — Telephone Encounter (Signed)
Dtr wants to cancel CT this month  because pt just had one in June/

## 2012-06-02 ENCOUNTER — Other Ambulatory Visit: Payer: Medicare Other | Admitting: Lab

## 2012-06-02 ENCOUNTER — Other Ambulatory Visit (HOSPITAL_COMMUNITY): Payer: Medicare Other

## 2012-06-05 ENCOUNTER — Ambulatory Visit: Payer: Medicare Other | Admitting: Internal Medicine

## 2012-06-07 ENCOUNTER — Other Ambulatory Visit: Payer: Self-pay | Admitting: Cardiovascular Disease

## 2012-06-12 ENCOUNTER — Emergency Department (HOSPITAL_COMMUNITY)
Admission: EM | Admit: 2012-06-12 | Discharge: 2012-06-12 | Disposition: A | Discharge status: Home / Self Care | Payer: Medicare Other | Attending: Emergency Medicine | Admitting: Emergency Medicine

## 2012-06-12 ENCOUNTER — Emergency Department (HOSPITAL_COMMUNITY): Payer: Medicare Other

## 2012-06-12 ENCOUNTER — Encounter (HOSPITAL_COMMUNITY): Payer: Self-pay | Admitting: Emergency Medicine

## 2012-06-12 DIAGNOSIS — E7800 Pure hypercholesterolemia, unspecified: Secondary | ICD-10-CM | POA: Insufficient documentation

## 2012-06-12 DIAGNOSIS — K219 Gastro-esophageal reflux disease without esophagitis: Secondary | ICD-10-CM | POA: Insufficient documentation

## 2012-06-12 DIAGNOSIS — E785 Hyperlipidemia, unspecified: Secondary | ICD-10-CM | POA: Insufficient documentation

## 2012-06-12 DIAGNOSIS — I10 Essential (primary) hypertension: Secondary | ICD-10-CM | POA: Insufficient documentation

## 2012-06-12 DIAGNOSIS — E119 Type 2 diabetes mellitus without complications: Secondary | ICD-10-CM | POA: Insufficient documentation

## 2012-06-12 DIAGNOSIS — J441 Chronic obstructive pulmonary disease with (acute) exacerbation: Secondary | ICD-10-CM | POA: Insufficient documentation

## 2012-06-12 DIAGNOSIS — I2510 Atherosclerotic heart disease of native coronary artery without angina pectoris: Secondary | ICD-10-CM | POA: Insufficient documentation

## 2012-06-12 LAB — CBC WITH DIFFERENTIAL/PLATELET
Eosinophils Absolute: 0.1 10*3/uL (ref 0.0–0.7)
HCT: 32.5 % — ABNORMAL LOW (ref 36.0–46.0)
Lymphs Abs: 1.4 10*3/uL (ref 0.7–4.0)
MCH: 29.6 pg (ref 26.0–34.0)
Monocytes Relative: 9 % (ref 3–12)
Neutro Abs: 4.9 10*3/uL (ref 1.7–7.7)
Neutrophils Relative %: 70 % (ref 43–77)
WBC: 7.1 10*3/uL (ref 4.0–10.5)

## 2012-06-12 LAB — BASIC METABOLIC PANEL
BUN: 40 mg/dL — ABNORMAL HIGH (ref 6–23)
Chloride: 98 mEq/L (ref 96–112)
Glucose, Bld: 115 mg/dL — ABNORMAL HIGH (ref 70–99)
Potassium: 4 mEq/L (ref 3.5–5.1)

## 2012-06-12 MED ORDER — PREDNISONE 20 MG PO TABS
60.0000 mg | ORAL_TABLET | Freq: Once | ORAL | Status: AC
  Administered 2012-06-12: 60 mg via ORAL
  Filled 2012-06-12: qty 3

## 2012-06-12 MED ORDER — PREDNISONE 20 MG PO TABS: 40.0000 mg | ORAL_TABLET | Freq: Every day | ORAL | Status: AC

## 2012-06-12 MED ORDER — DOXYCYCLINE HYCLATE 100 MG PO CAPS: 100.0000 mg | ORAL_CAPSULE | Freq: Two times a day (BID) | ORAL | Status: AC

## 2012-06-12 MED ORDER — ALBUTEROL SULFATE (5 MG/ML) 0.5% IN NEBU
5.0000 mg | INHALATION_SOLUTION | Freq: Once | RESPIRATORY_TRACT | Status: AC
  Administered 2012-06-12: 5 mg via RESPIRATORY_TRACT
  Filled 2012-06-12: qty 1

## 2012-06-12 NOTE — ED Provider Notes (Signed)
I saw and evaluated the patient, reviewed the resident's note and I agree with the findings and plan.  Pt with mild exp wheezing, symptoms of bronchitis, neg CXR, RA sats are 92-94%which is baseline for her.  No CP.  Will give nebs, steroids, oral abx and pt can follow up with PCP in [facility].    Gavin Pound. Oletta Lamas, MD 06/12/12 2329

## 2012-06-12 NOTE — ED Notes (Signed)
Pt c/o productive cough with yellow sputum starting yesterday; pt denies fever

## 2012-06-12 NOTE — ED Provider Notes (Signed)
History     CSN: [id number]  Arrival date & time 06/12/12  1549   First MD Initiated Contact with Patient 06/12/12 2044      Chief Complaint  Patient presents with  . Cough    (Consider location/radiation/quality/duration/timing/severity/associated sxs/prior treatment) Patient is a 78 y.o. female presenting with cough. The history is provided by the patient.  Cough This is a recurrent problem. The current episode started more than 1 week ago. The problem occurs every few minutes. The problem has been gradually worsening. The cough is productive of sputum. There has been no fever. Associated symptoms include wheezing. Pertinent negatives include no chest pain, no chills, no ear congestion, no ear pain, no headaches and no shortness of breath. Treatments tried: Albuterol inhaler, Robitussin. The treatment provided mild relief. She is not a smoker. Her past medical history is significant for COPD.    Past Medical History  Diagnosis Date  . Cancer   . Diabetes mellitus   . Hypertension   . Coronary artery disease   . Aortic insufficiency   . Hyperlipidemia   . Aortic aneurysm   . Hypercholesterolemia   . COPD (chronic obstructive pulmonary disease)   . Bronchitis   . Edema   . GERD (gastroesophageal reflux disease)   . Aortic valve disease     stable  . Pain in joint, forearm   . Pain in joint, hand   . Pain in joint, lower leg   . Difficulty in walking   . Disturbance of skin sensation     Past Surgical History  Procedure Date  . Pars plana vitrectomy     right eye  . Posterior capsulectomy     right eye  . Pars plana vitrectomy w/ endophotocoagulation     right eye  . Fiberoptic bronch with endobronchial u/s 07/02/2010  . Video bronchoscopy 12/09/2009  . Endobronchial excision of right upper lobe tumor with laser bronchi 10/06/2009  . Bronch with endobronchial biopies 09/15/2009    History reviewed. No pertinent family history.  History  Substance Use Topics    . Smoking status: Never Smoker   . Smokeless tobacco: Not on file  . Alcohol Use: No    OB History    Grav Para Term Preterm Abortions TAB SAB Ect Mult Living                  Review of Systems  Constitutional: Negative for fever and chills.  HENT: Negative.  Negative for ear pain and congestion.   Eyes: Negative.   Respiratory: Positive for cough and wheezing. Negative for shortness of breath.   Cardiovascular: Negative for chest pain and palpitations.  Gastrointestinal: Negative.   Genitourinary: Negative.   Musculoskeletal: Negative.   Skin: Negative.   Neurological: Negative.  Negative for headaches.  All other systems reviewed and are negative.    Allergies  Review of patient's allergies indicates no known allergies.  Home Medications   Current Outpatient Rx  Name Route Sig Dispense Refill  . ACETAMINOPHEN 325 MG PO TABS Oral Take 325 mg by mouth every 6 (six) hours as needed. For pain    . ALBUTEROL SULFATE HFA 108 (90 BASE) MCG/ACT IN AERS Inhalation Inhale 2 puffs into the lungs every 6 (six) hours as needed. For wheezing    . ASPIRIN 81 MG PO TABS Oral Take 81 mg by mouth daily.      Marland Kitchen BENZONATATE 100 MG PO CAPS Oral Take 100 mg by mouth 3 (three) times daily  as needed. For cough    . INTEGRA PLUS PO CAPS Oral Take 1 capsule by mouth daily.     Marland Kitchen FLUTICASONE PROPIONATE 50 MCG/ACT NA SUSP Nasal Place 2 sprays into the nose daily.      Marland Kitchen GINKGO BILOBA EXTR Oral Take 1 tablet by mouth daily.     Marland Kitchen GLIMEPIRIDE 1 MG PO TABS Oral Take 1 mg by mouth daily.      . INDAPAMIDE 2.5 MG PO TABS Oral Take 2.5 mg by mouth every morning.      . ADULT MULTIVITAMIN W/MINERALS CH Oral Take 1 tablet by mouth daily.    Marland Kitchen NIFEDIPINE ER OSMOTIC 60 MG PO TB24 Oral Take 60 mg by mouth daily.     Marland Kitchen NITROGLYCERIN 0.4 MG SL SUBL Sublingual Place 0.4 mg under the tongue every 5 (five) minutes as needed. For chest pain    . OLMESARTAN MEDOXOMIL 40 MG PO TABS Oral Take 40 mg by mouth 2 (two)  times daily.    Marland Kitchen OMEPRAZOLE 20 MG PO CPDR Oral Take 20 mg by mouth daily.    Marland Kitchen PIOGLITAZONE HCL 45 MG PO TABS Oral Take 45 mg by mouth daily.      Marland Kitchen POTASSIUM CHLORIDE CRYS ER 10 MEQ PO TBCR Oral Take 20 mEq by mouth daily.    Marland Kitchen ROSUVASTATIN CALCIUM 20 MG PO TABS Oral Take 10 mg by mouth daily.    Marland Kitchen DOXYCYCLINE HYCLATE 100 MG PO CAPS Oral Take 1 capsule (100 mg total) by mouth 2 (two) times daily. 14 capsule 0  . PREDNISONE 20 MG PO TABS Oral Take 2 tablets (40 mg total) by mouth daily. 8 tablet 0    BP 128/58  Pulse 77  Temp 97.8 F (36.6 C) (Oral)  Resp 18  SpO2 92%  Physical Exam  Nursing note and vitals reviewed. Constitutional: She is oriented to person, place, and time. She appears well-developed and well-nourished. No distress.  HENT:  Head: Normocephalic and atraumatic.  Eyes: Conjunctivae are normal.  Neck: Neck supple.  Cardiovascular: Normal rate, regular rhythm, normal heart sounds and intact distal pulses.   Pulmonary/Chest: Effort normal. She has wheezes (all fields). She has no rales.  Abdominal: Soft. She exhibits no distension. There is no tenderness.  Musculoskeletal: Normal range of motion.  Neurological: She is alert and oriented to person, place, and time.  Skin: Skin is warm and dry.    ED Course  Procedures (including critical care time)  Labs Reviewed  CBC WITH DIFFERENTIAL - Abnormal; Notable for the following:    RBC 3.61 (*)     Hemoglobin 10.7 (*)     HCT 32.5 (*)     All other components within normal limits  BASIC METABOLIC PANEL - Abnormal; Notable for the following:    Glucose, Bld 115 (*)     BUN 40 (*)     Creatinine, Ser 1.15 (*)     GFR calc non Af Amer 44 (*)     GFR calc Af Amer 51 (*)     All other components within normal limits   Dg Chest 2 View  06/12/2012  *RADIOLOGY REPORT*  Clinical Data: Cough.  CHEST - 2 VIEW  Comparison: 03/04/2012  Findings: Cardiomegaly.  Multiple nodular densities project over the lungs bilaterally  as seen on prior CT.  Bibasilar atelectasis. No effusions.  No acute bony abnormality.  IMPRESSION: Bilateral nodular densities, likely stable since prior CT.  Cardiomegaly, bibasilar atelectasis.   Original Report  Authenticated By: Cyndie Chime, M.D.      1. COPD exacerbation       MDM  78 yo female with PMHx of COPD, DM, CAD, HTN who presents with one week history of worsening productive cough.  No fever, chest pain or shortness of breath.  Mild relief of sx with albuterol inhaler and Robitussin.  AF, VSS, NAD at presentation.  Physical exam with wheezing in all fields.  No rales.  CXR with bibasilar atelectasis.  WBC 7.1.  Presentation consistent with COPD exacerbation.  Will give albuterol neb and Prednisone 60 mg in the ED.  Wheezing improved after albuterol neb.  Will DC home with course of prednisone and doxycycline.  Tx plan discussed with pt who voiced understanding.  Return precautions provided.        Cherre Robins, MD 06/12/12 [phone]

## 2012-06-12 NOTE — ED Notes (Signed)
Pt c/o cough that started yesterday.  Pt reports copious amount of yellow/green-tinged sputum.  Pt denies having any pain or weakness.  Nad.

## 2012-06-15 ENCOUNTER — Telehealth: Payer: Self-pay | Admitting: Internal Medicine

## 2012-06-15 NOTE — Telephone Encounter (Signed)
s/w daughter and she changed appt to 9/13 and 9/17     aom

## 2012-06-16 ENCOUNTER — Other Ambulatory Visit: Payer: Medicare Other | Admitting: Lab

## 2012-06-19 ENCOUNTER — Ambulatory Visit: Payer: Medicare Other | Admitting: Internal Medicine

## 2012-06-22 ENCOUNTER — Ambulatory Visit: Payer: Medicare Other | Admitting: Cardiovascular Disease

## 2012-06-29 ENCOUNTER — Ambulatory Visit (INDEPENDENT_AMBULATORY_CARE_PROVIDER_SITE_OTHER): Payer: Medicare Other | Admitting: Cardiovascular Disease

## 2012-06-29 ENCOUNTER — Encounter: Payer: Self-pay | Admitting: Cardiovascular Disease

## 2012-06-29 VITALS — BP 108/64 | HR 96 | Ht 62.0 in | Wt 164.8 lb

## 2012-06-29 DIAGNOSIS — J449 Chronic obstructive pulmonary disease, unspecified: Secondary | ICD-10-CM

## 2012-06-29 DIAGNOSIS — I719 Aortic aneurysm of unspecified site, without rupture: Secondary | ICD-10-CM

## 2012-06-29 DIAGNOSIS — I359 Nonrheumatic aortic valve disorder, unspecified: Secondary | ICD-10-CM

## 2012-06-29 DIAGNOSIS — J4489 Other specified chronic obstructive pulmonary disease: Secondary | ICD-10-CM

## 2012-06-29 DIAGNOSIS — E7800 Pure hypercholesterolemia, unspecified: Secondary | ICD-10-CM

## 2012-06-29 DIAGNOSIS — D3A00 Benign carcinoid tumor of unspecified site: Secondary | ICD-10-CM

## 2012-06-29 DIAGNOSIS — I10 Essential (primary) hypertension: Secondary | ICD-10-CM

## 2012-06-29 DIAGNOSIS — E119 Type 2 diabetes mellitus without complications: Secondary | ICD-10-CM

## 2012-06-29 MED ORDER — NITROGLYCERIN 0.4 MG SL SUBL: 0.4000 mg | SUBLINGUAL_TABLET | SUBLINGUAL | Status: DC | PRN

## 2012-06-29 MED ORDER — FUROSEMIDE 20 MG PO TABS: 20.0000 mg | ORAL_TABLET | Freq: Every day | ORAL | Status: DC

## 2012-06-29 NOTE — Assessment & Plan Note (Signed)
F/U CVTS  Consider repeat contrast CT  Not an ideal operative candidate

## 2012-06-29 NOTE — Assessment & Plan Note (Signed)
Cholesterol is at goal.  Continue current dose of statin and diet Rx.  No myalgias or side effects.  F/U  LFT's in 6 months. No results found for this basename: LDLCALC             

## 2012-06-29 NOTE — Assessment & Plan Note (Signed)
Discussed low carb diet.  Target hemoglobin A1c is 6.5 or less.  Continue current medications.  

## 2012-06-29 NOTE — Assessment & Plan Note (Signed)
F/U Dr Bernette Mayers CT stable lung nodules

## 2012-06-29 NOTE — Assessment & Plan Note (Signed)
Well controlled.  Continue current medications and low sodium Dash type diet.    

## 2012-06-29 NOTE — Patient Instructions (Signed)
Your physician wants you to follow-up in: YEAR WITH DR NISHAN  You will receive a reminder letter in the mail two months in advance. If you don't receive a letter, please call our office to schedule the follow-up appointment.  Your physician recommends that you continue on your current medications as directed. Please refer to the Current Medication list given to you today. 

## 2012-06-29 NOTE — Assessment & Plan Note (Signed)
Mild on exam see last echo report with AV sclerosis

## 2012-06-29 NOTE — Assessment & Plan Note (Signed)
Improved since ER visit two weeks ago  Inhaler and F/U primary

## 2012-06-29 NOTE — Progress Notes (Signed)
Patient ID: [name], female   DOB: [birth year], 78 y.o.   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 I reviewed a letter from Dr. Edwyna [name] indicating that her ascending aorta had reached 5 cm. She was referred to [facility] for further evaluation. She does not strike me as an ideal operative candidate. Her hypertension has been under good control. She has had her norvas stopped and Procardia increased to 60 mg which I think is fine. She has significant lower extremity edema from varicose veins and this is harder to treat while on this particular oral hypoglycemic. Otherwise she's not had any significant chest pain. She has mild chronic edema she has mild chronic exertional dyspnea. I believe she sees Dr. Bernette [name] for her carcinoidNeeds F/U with CVTS  regarding thoracic aneurysm My understanding is [facility] is stenting some throracic disease and she may be a candidate for this  Cr 2 weeks ago was normal 1.14  Recent ER visit for COPD exacerbation.    Echo 10/29/11 reviewed Study Conclusions  - Left ventricle: The cavity size was normal. Wall thickness was increased in a pattern of moderate LVH. Systolic function was normal. The estimated ejection fraction was in the range of 55% to 60%. Wall motion was normal; there were no regional wall motion abnormalities. - Aortic valve: Sclerosis without stenosis. Moderate regurgitation. - Mitral valve: Calcified annulus. Mild regurgitation. - Right ventricle: The cavity size was normal. Systolic function was normal. - Right atrium: The atrium was mildly dilated. - Pulmonary arteries: PA peak pressure: 50mm Hg (S).  Noncontrast CT 5/13  With fairly stable nodules Contrast CT 1/12  Aorta 4.9 cm  IMPRESSION: Multiple pulmonary nodules of varying sizes which appear relatively stable. The only nodule which does appear to have  increased somewhat lies just above the right hemidiaphragm measuring 18 mm compared to 14 mm previously.       ROS: Denies fever, malais, weight loss, blurry vision, decreased visual acuity, cough, sputum, SOB, hemoptysis, pleuritic pain, palpitaitons, heartburn, abdominal pain, melena, lower extremity edema, claudication, or rash.  All other systems reviewed and negative  General: Affect appropriate Chronically ill female HEENT: normal Neck supple with no adenopathy JVP normal no bruits no thyromegaly Lungs clear with no wheezing and good diaphragmatic motion Heart:  S1/S2 systolic  murmur, no rub, gallop or click PMI normal Abdomen: benighn, BS positve, no tenderness, no AAA no bruit.  No HSM or HJR Distal pulses intact with no bruits Trace bilateral  edema Neuro non-focal Skin warm and dry No muscular weakness   Current Outpatient Prescriptions  Medication Sig Dispense Refill  . acetaminophen (TYLENOL) 325 MG tablet Take 325 mg by mouth every 6 (six) hours as needed. For pain      . albuterol (PROVENTIL HFA) 108 (90 BASE) MCG/ACT inhaler Inhale 2 puffs into the lungs every 6 (six) hours as needed. For wheezing      . aspirin 81 MG tablet Take 81 mg by mouth daily.        . benzonatate (TESSALON) 100 MG capsule Take 100 mg by mouth 3 (three) times daily as needed. For cough      . FeFum-FePoly-FA-B Cmp-C-Biot (INTEGRA PLUS) CAPS Take 1 capsule by mouth daily.       . fluticasone (FLONASE) 50 MCG/ACT nasal spray Place 2 sprays into the nose daily.        . Ginkgo Biloba EXTR Take  1 tablet by mouth daily.       Marland Kitchen glimepiride (AMARYL) 1 MG tablet Take 1 mg by mouth daily.        . indapamide (LOZOL) 2.5 MG tablet Take 2.5 mg by mouth every morning.        . Multiple Vitamin (MULITIVITAMIN WITH MINERALS) TABS Take 1 tablet by mouth daily.      Marland Kitchen NIFEdipine (PROCARDIA XL/ADALAT-CC) 60 MG 24 hr tablet Take 60 mg by mouth daily.       . nitroGLYCERIN (NITROSTAT) 0.4 MG SL tablet  Place 0.4 mg under the tongue every 5 (five) minutes as needed. For chest pain      . olmesartan (BENICAR) 40 MG tablet Take 40 mg by mouth 2 (two) times daily.      Marland Kitchen omeprazole (PRILOSEC) 20 MG capsule Take 20 mg by mouth daily.      . pioglitazone (ACTOS) 45 MG tablet Take 45 mg by mouth daily.        . potassium chloride (K-DUR,KLOR-CON) 10 MEQ tablet Take 20 mEq by mouth daily.      . rosuvastatin (CRESTOR) 20 MG tablet Take 10 mg by mouth daily.        Allergies  Review of patient's allergies indicates no known allergies.  Electrocardiogram:  SR rate 75 PVC  LAD LVH  Assessment and Plan

## 2012-07-07 ENCOUNTER — Other Ambulatory Visit (HOSPITAL_BASED_OUTPATIENT_CLINIC_OR_DEPARTMENT_OTHER): Payer: Medicare Other | Admitting: Lab

## 2012-07-07 DIAGNOSIS — D3A00 Benign carcinoid tumor of unspecified site: Secondary | ICD-10-CM

## 2012-07-07 LAB — COMPREHENSIVE METABOLIC PANEL (CC13)
BUN: 23 mg/dL (ref 7.0–26.0)
CO2: 28 mEq/L (ref 22–29)
Calcium: 9.4 mg/dL (ref 8.4–10.4)
Chloride: 104 mEq/L (ref 98–107)
Creatinine: 0.8 mg/dL (ref 0.6–1.1)

## 2012-07-07 LAB — CBC WITH DIFFERENTIAL/PLATELET
BASO%: 0.4 % (ref 0.0–2.0)
Basophils Absolute: 0 10*3/uL (ref 0.0–0.1)
HCT: 34.5 % — ABNORMAL LOW (ref 34.8–46.6)
HGB: 11.3 g/dL — ABNORMAL LOW (ref 11.6–15.9)
MCHC: 32.9 g/dL (ref 31.5–36.0)
MONO#: 0.5 10*3/uL (ref 0.1–0.9)
NEUT%: 72.1 % (ref 38.4–76.8)
WBC: 6.6 10*3/uL (ref 3.9–10.3)
lymph#: 1.2 10*3/uL (ref 0.9–3.3)

## 2012-07-11 ENCOUNTER — Ambulatory Visit (HOSPITAL_BASED_OUTPATIENT_CLINIC_OR_DEPARTMENT_OTHER): Payer: Medicare Other | Admitting: Internal Medicine

## 2012-07-11 VITALS — BP 130/83 | HR 119 | Temp 97.7°F | Resp 20 | Ht 62.0 in | Wt 170.4 lb

## 2012-07-11 DIAGNOSIS — D3A00 Benign carcinoid tumor of unspecified site: Secondary | ICD-10-CM

## 2012-07-11 DIAGNOSIS — Z85118 Personal history of other malignant neoplasm of bronchus and lung: Secondary | ICD-10-CM

## 2012-07-11 NOTE — Progress Notes (Signed)
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   PRIOR THERAPY:  1. Status post resection of multiple nodules from the left lung under the care of Dr Edwyna Shell on July 15, 2005. 2. Status post repeat bronchoscopy with endobronchial excision of the right upper lobe tumor and laser bronchoscopy under the care of Dr Edwyna Shell on October 08, 2009.  CURRENT THERAPY: Observation.  INTERVAL HISTORY: [name] 78 y.o. female returns to the clinic today for routine six-month followup visit accompanied by her daughter. The patient is feeling fine today with no specific complaints. She denied having any significant chest pain but continues to have shortness breath with exertion and mild cough. She denied having any hemoptysis. The patient has no significant weight loss or night sweats. She has repeat chest x-ray and lab work done recently and she is here for evaluation and discussion of her results.  MEDICAL HISTORY: Past Medical History  Diagnosis Date  . Cancer   . Diabetes mellitus   . Hypertension   . Coronary artery disease   . Aortic insufficiency   . Hyperlipidemia   . Aortic aneurysm   . Hypercholesterolemia   . COPD (chronic obstructive pulmonary disease)   . Bronchitis   . Edema   . GERD (gastroesophageal reflux disease)   . Aortic valve disease     stable  . Pain in joint, forearm   . Pain in joint, hand   . Pain in joint, lower leg   . Difficulty in walking   . Disturbance of skin sensation     ALLERGIES:   has no known allergies.  MEDICATIONS:  Current Outpatient Prescriptions  Medication Sig Dispense Refill  . acetaminophen (TYLENOL) 325 MG tablet Take 325 mg by mouth every 6 (six) hours as needed. For pain      . albuterol (PROVENTIL HFA) 108 (90 BASE) MCG/ACT inhaler Inhale 2 puffs into the lungs  every 6 (six) hours as needed. For wheezing      . aspirin 81 MG tablet Take 81 mg by mouth daily.        . benzonatate (TESSALON) 100 MG capsule Take 100 mg by mouth 3 (three) times daily as needed. For cough      . FeFum-FePoly-FA-B Cmp-C-Biot (INTEGRA PLUS) CAPS Take 1 capsule by mouth daily.       . fluticasone (FLONASE) 50 MCG/ACT nasal spray Place 2 sprays into the nose daily.        . furosemide (LASIX) 20 MG tablet Take 1 tablet (20 mg total) by mouth daily.  90 tablet  3  . Ginkgo Biloba EXTR Take 1 tablet by mouth daily.       Marland Kitchen glimepiride (AMARYL) 1 MG tablet Take 1 mg by mouth daily.        . indapamide (LOZOL) 2.5 MG tablet Take 2.5 mg by mouth every morning.        . Multiple Vitamin (MULITIVITAMIN WITH MINERALS) TABS Take 1 tablet by mouth daily.      Marland Kitchen NIFEdipine (PROCARDIA XL/ADALAT-CC) 60 MG 24 hr tablet Take 60 mg by mouth daily.       Marland Kitchen olmesartan (BENICAR) 40 MG tablet Take 40 mg by mouth 2 (two) times daily.      Marland Kitchen omeprazole (PRILOSEC) 20 MG capsule Take 20 mg by mouth daily.      Marland Kitchen  pioglitazone (ACTOS) 45 MG tablet Take 45 mg by mouth daily.        . potassium chloride (K-DUR,KLOR-CON) 10 MEQ tablet Take 20 mEq by mouth daily.      . rosuvastatin (CRESTOR) 20 MG tablet Take 10 mg by mouth daily.      . nitroGLYCERIN (NITROSTAT) 0.4 MG SL tablet Place 1 tablet (0.4 mg total) under the tongue every 5 (five) minutes as needed. For chest pain  25 tablet  4    SURGICAL HISTORY:  Past Surgical History  Procedure Date  . Pars plana vitrectomy     right eye  . Posterior capsulectomy     right eye  . Pars plana vitrectomy w/ endophotocoagulation     right eye  . Fiberoptic bronch with endobronchial u/s 07/02/2010  . Video bronchoscopy 12/09/2009  . Endobronchial excision of right upper lobe tumor with laser bronchi 10/06/2009  . Bronch with endobronchial biopies 09/15/2009    REVIEW OF SYSTEMS:  A comprehensive review of systems was negative except for:  Constitutional: positive for fatigue Respiratory: positive for cough and dyspnea on exertion   PHYSICAL EXAMINATION: General appearance: alert, cooperative and no distress Head: Normocephalic, without obvious abnormality, atraumatic Neck: no adenopathy Lymph nodes: Cervical, supraclavicular, and axillary nodes normal. Resp: clear to auscultation bilaterally Cardio: regular rate and rhythm, S1, S2 normal, no murmur, click, rub or gallop GI: soft, non-tender; bowel sounds normal; no masses,  no organomegaly Extremities: extremities normal, atraumatic, no cyanosis or edema Neurologic: Alert and oriented X 3, normal strength and tone. Normal symmetric reflexes. Normal coordination and gait  ECOG PERFORMANCE STATUS: 1 - Symptomatic but completely ambulatory  Blood pressure 130/83, pulse 119, temperature 97.7 F (36.5 C), resp. rate 20, height 5\' 2"  (1.575 m), weight 170 lb 6.4 oz (77.293 kg).  LABORATORY DATA: Lab Results  Component Value Date   WBC 6.6 07/07/2012   HGB 11.3* 07/07/2012   HCT 34.5* 07/07/2012   MCV 91.7 07/07/2012   PLT 240 07/07/2012      Chemistry      Component Value Date/Time   NA 140 07/07/2012 1435   NA 137 06/12/2012 2124   NA 144 12/02/2011 1053   K 3.7 07/07/2012 1435   K 4.0 06/12/2012 2124   K 3.7 12/02/2011 1053   CL 104 07/07/2012 1435   CL 98 06/12/2012 2124   CL 100 12/02/2011 1053   CO2 28 07/07/2012 1435   CO2 28 06/12/2012 2124   CO2 30 12/02/2011 1053   BUN 23.0 07/07/2012 1435   BUN 40* 06/12/2012 2124   BUN 20 12/02/2011 1053   CREATININE 0.8 07/07/2012 1435   CREATININE 1.15* 06/12/2012 2124   CREATININE 0.7 12/02/2011 1053      Component Value Date/Time   CALCIUM 9.4 07/07/2012 1435   CALCIUM 9.3 06/12/2012 2124   CALCIUM 9.1 12/02/2011 1053   ALKPHOS 77 07/07/2012 1435   ALKPHOS 73 12/02/2011 1053   ALKPHOS 79 10/21/2011 0630   AST 18 07/07/2012 1435   AST 30 12/02/2011 1053   AST 22 10/21/2011 0630   ALT 14 07/07/2012 1435   ALT 17 10/21/2011 0630   BILITOT  0.40 07/07/2012 1435   BILITOT 0.80 12/02/2011 1053   BILITOT 0.3 10/21/2011 0630       RADIOGRAPHIC STUDIES: Dg Chest 2 View  06/12/2012  *RADIOLOGY REPORT*  Clinical Data: Cough.  CHEST - 2 VIEW  Comparison: 03/04/2012  Findings: Cardiomegaly.  Multiple nodular densities project over the lungs bilaterally  as seen on prior CT.  Bibasilar atelectasis. No effusions.  No acute bony abnormality.  IMPRESSION: Bilateral nodular densities, likely stable since prior CT.  Cardiomegaly, bibasilar atelectasis.   Original Report Authenticated By: Cyndie Chime, M.D.     ASSESSMENT: This is a very pleasant 78 years old white female with history of carcinoid tumor of the lung status post resection and the patient has been observation for the last 7 years with no evidence for disease progression.  PLAN: I discussed the x-ray results and lab work with the patient and her daughter today. I recommended for her to continue on observation with repeat chest x-ray and CBC and comprehensive metabolic panel in one year. She would come back for followup visit at that time. She was advised to call me immediately if she has any concerning symptoms in the interval  All questions were answered. The patient knows to call the clinic with any problems, questions or concerns. We can certainly see the patient much sooner if necessary.

## 2012-07-11 NOTE — Patient Instructions (Signed)
Her chest x-ray and lab work was unremarkable today except for mild anemia. Continue on Integra plus 1 capsule  by mouth daily Followup in one year with repeat chest x-ray and blood work Please call if you have any questions in the interval

## 2012-07-14 ENCOUNTER — Telehealth: Payer: Self-pay | Admitting: Internal Medicine

## 2012-07-14 NOTE — Telephone Encounter (Signed)
pt kept hanging up.....mailed sept 2014 schedule to pt....sed

## 2012-08-04 ENCOUNTER — Other Ambulatory Visit: Payer: Self-pay | Admitting: Internal Medicine

## 2012-09-06 ENCOUNTER — Other Ambulatory Visit: Payer: Self-pay | Admitting: Thoracic Surgery (Cardiothoracic Vascular Surgery)

## 2012-09-06 DIAGNOSIS — D381 Neoplasm of uncertain behavior of trachea, bronchus and lung: Secondary | ICD-10-CM

## 2012-09-12 ENCOUNTER — Other Ambulatory Visit: Payer: Self-pay | Admitting: Thoracic Surgery (Cardiothoracic Vascular Surgery)

## 2012-09-12 DIAGNOSIS — D381 Neoplasm of uncertain behavior of trachea, bronchus and lung: Secondary | ICD-10-CM

## 2012-10-04 DIAGNOSIS — J45991 Cough variant asthma: Secondary | ICD-10-CM | POA: Insufficient documentation

## 2012-10-10 ENCOUNTER — Other Ambulatory Visit: Payer: Medicare Other

## 2012-10-10 ENCOUNTER — Ambulatory Visit: Payer: Medicare Other | Admitting: Thoracic Surgery (Cardiothoracic Vascular Surgery)

## 2012-10-24 ENCOUNTER — Ambulatory Visit: Payer: Medicare Other | Admitting: Thoracic Surgery (Cardiothoracic Vascular Surgery)

## 2012-10-24 ENCOUNTER — Other Ambulatory Visit: Payer: Medicare Other

## 2012-11-07 ENCOUNTER — Encounter: Payer: Self-pay | Admitting: Thoracic Surgery (Cardiothoracic Vascular Surgery)

## 2012-11-07 ENCOUNTER — Telehealth: Payer: Self-pay | Admitting: Cardiovascular Disease

## 2012-11-07 ENCOUNTER — Ambulatory Visit (INDEPENDENT_AMBULATORY_CARE_PROVIDER_SITE_OTHER): Payer: Medicare Other | Admitting: Thoracic Surgery (Cardiothoracic Vascular Surgery)

## 2012-11-07 ENCOUNTER — Ambulatory Visit
Admission: RE | Admit: 2012-11-07 | Discharge: 2012-11-07 | Disposition: A | Discharge status: Home / Self Care | Payer: Medicare Other | Source: Ambulatory Visit | Attending: Thoracic Surgery (Cardiothoracic Vascular Surgery) | Admitting: Thoracic Surgery (Cardiothoracic Vascular Surgery)

## 2012-11-07 VITALS — BP 113/76 | HR 128 | Resp 20 | Ht 61.0 in | Wt 180.0 lb

## 2012-11-07 DIAGNOSIS — Z8639 Personal history of other endocrine, nutritional and metabolic disease: Secondary | ICD-10-CM

## 2012-11-07 DIAGNOSIS — R918 Other nonspecific abnormal finding of lung field: Secondary | ICD-10-CM

## 2012-11-07 DIAGNOSIS — D3A00 Benign carcinoid tumor of unspecified site: Secondary | ICD-10-CM

## 2012-11-07 DIAGNOSIS — Z862 Personal history of diseases of the blood and blood-forming organs and certain disorders involving the immune mechanism: Secondary | ICD-10-CM

## 2012-11-07 DIAGNOSIS — D381 Neoplasm of uncertain behavior of trachea, bronchus and lung: Secondary | ICD-10-CM

## 2012-11-07 NOTE — Telephone Encounter (Signed)
Walk In pt Form " Pt Needs Autho For Medication" Sent to Christine/Nishan  11/07/12/KM

## 2012-11-07 NOTE — Progress Notes (Signed)
HPI:  Mrs. Tauer returns today for a scheduled 6 month followup visit. She is a 79 year old woman with a history of multiple carcinoid syndrome. She previously been followed by Dr. Edwyna Shell. Dr. Edwyna Shell had done and endoscopic resection of a right upper lobe tumor with laser bronchoscopy in December of 2010. He had subsequent on followup video bronchoscopies in February and September of 2011, which showed some scarring at the area of previous resection but no recurrent tumor. She has multiple bilateral lung nodules which been followed with CT scans at six-month intervals.. She says that since her last visit with Dr. Edwyna Shell she continues to have a persistent cough. This is not present at all times but she just has frequent spells of coughing. She did recently have bronchitis.  Past Medical History  Diagnosis Date  . Cancer   . Diabetes mellitus   . Hypertension   . Coronary artery disease   . Aortic insufficiency   . Hyperlipidemia   . Aortic aneurysm   . Hypercholesterolemia   . COPD (chronic obstructive pulmonary disease)   . Bronchitis   . Edema   . GERD (gastroesophageal reflux disease)   . Aortic valve disease     stable  . Pain in joint, forearm   . Pain in joint, hand   . Pain in joint, lower leg   . Difficulty in walking   . Disturbance of skin sensation       Current Outpatient Prescriptions  Medication Sig Dispense Refill  . acetaminophen (TYLENOL) 325 MG tablet Take 325 mg by mouth every 6 (six) hours as needed. For pain      . albuterol (PROVENTIL HFA) 108 (90 BASE) MCG/ACT inhaler Inhale 2 puffs into the lungs every 6 (six) hours as needed. For wheezing      . aspirin 81 MG tablet Take 81 mg by mouth daily.        . benzonatate (TESSALON) 100 MG capsule Take 100 mg by mouth 3 (three) times daily as needed. For cough      . FeFum-FePoly-FA-B Cmp-C-Biot (INTEGRA PLUS) CAPS TAKE 1 CAPSULE BY MOUTH EVERY DAY  30 capsule  5  . fluticasone (FLONASE) 50 MCG/ACT nasal spray  Place 2 sprays into the nose daily.        . furosemide (LASIX) 20 MG tablet Take 1 tablet (20 mg total) by mouth daily.  90 tablet  3  . Ginkgo Biloba EXTR Take 1 tablet by mouth daily.       Marland Kitchen glimepiride (AMARYL) 1 MG tablet Take 1 mg by mouth daily.        . indapamide (LOZOL) 2.5 MG tablet Take 2.5 mg by mouth every morning.        . Multiple Vitamin (MULITIVITAMIN WITH MINERALS) TABS Take 1 tablet by mouth daily.      Marland Kitchen NIFEdipine (PROCARDIA XL/ADALAT-CC) 60 MG 24 hr tablet Take 60 mg by mouth daily.       . nitroGLYCERIN (NITROSTAT) 0.4 MG SL tablet Place 1 tablet (0.4 mg total) under the tongue every 5 (five) minutes as needed. For chest pain  25 tablet  4  . olmesartan (BENICAR) 40 MG tablet Take 40 mg by mouth 2 (two) times daily.      Marland Kitchen omeprazole (PRILOSEC) 20 MG capsule Take 20 mg by mouth daily.      . pioglitazone (ACTOS) 45 MG tablet Take 45 mg by mouth daily.        . potassium chloride (K-DUR,KLOR-CON) 10 MEQ  tablet Take 20 mEq by mouth daily.      . rosuvastatin (CRESTOR) 20 MG tablet Take 10 mg by mouth daily.        Physical Exam BP 113/76  Pulse 128  Resp 20  Ht 5\' 1"  (1.549 m)  Wt 180 lb (81.647 kg)  BMI 34.01 kg/m2  SpO70 34% 79 year old woman in no acute distress General obese Neck no palpable cervical or clavicular adenopathy Cardiac regular rate and rhythm, normal S1 and S2, 2/6 systolic murmur Lungs diminished breath sounds bilaterally, no rales or wheezes  Diagnostic Tests: CT of chest 11/07/2012  *RADIOLOGY REPORT*  Clinical Data: Cough, history of left lung cancer status post  surgery in 2010  CT CHEST WITHOUT CONTRAST  Technique: Multidetector CT imaging of the chest was performed  following the standard protocol without IV contrast.  Comparison: 04/12/2012  Findings: Prior left upper lobe wedge resection.  Numerous bilateral pulmonary nodules, grossly unchanged, including:  --5 mm posterior right upper lobe nodule (series 4/image 20),    unchanged  --8 mm nodule along the right minor fissure (series 4/image 33),  unchanged  --16 x 16 mm nodular opacity at the right lung base (series 4/image  38), previously 18 x 14 mm, favored to reflect a cluster of smaller  nodules  No pleural effusion or pneumothorax.  Visualized thyroid is grossly unremarkable.  Mild cardiomegaly. No pericardial effusion. Coronary  atherosclerosis. Atherosclerotic calcifications of the aortic  arch. Stable 5.0 cm ascending thoracic aortic aneurysm, unchanged  across multiple prior studies.  No suspicious mediastinal or axillary lymphadenopathy.  Visualized upper abdomen is unremarkable.  Degenerative changes of the visualized thoracolumbar spine.  IMPRESSION:  Prior left upper lobe wedge resection.  Numerous bilateral pulmonary nodules, grossly unchanged, as  described above.    Impression: 79 year old woman with history of multiple carcinoid syndrome. Her CT today shows multiple bilateral lung nodules which are essentially unchanged. She also has a stable 5 cm descending thoracic aortic aneurysm which also is unchanged.   Plan: We'll continue to follow her with CT scans. We'll continue six-month intervals for now.

## 2012-11-15 ENCOUNTER — Telehealth: Payer: Self-pay | Admitting: Cardiovascular Disease

## 2012-11-15 NOTE — Telephone Encounter (Signed)
Pt's dtr calling re status of paperwork sent by kim in medical records ,  [phone] reina

## 2012-11-15 NOTE — Telephone Encounter (Signed)
LMTCB ./CY 

## 2012-11-20 NOTE — Telephone Encounter (Signed)
INFO GIVEN   FOR PRIOR AUTH MAY TAKE  UP TO 72 HOURS  FOR  ANSWER./CY

## 2012-11-27 ENCOUNTER — Telehealth: Payer: Self-pay | Admitting: Cardiovascular Disease

## 2012-11-27 NOTE — Telephone Encounter (Signed)
Pt needs auth for benecar , dtr said is was denied once before because we didn't state it was medically necessary , pls call auth to [phone]

## 2012-11-28 NOTE — Telephone Encounter (Signed)
Advised pt that prior auth was faxed by Scherrie Bateman on 11/27/2012 at 4:16pm and that prior auths can take up to 72 hours to process.  Also advised pt that the form stated that the medication in question was medically necessary.  Pt hung up on me.

## 2012-11-28 NOTE — Telephone Encounter (Signed)
PT'S DTR CALLING BACK, THIS AUTH NOT DONE , VERY UPSET, CAN THIS BE DONE ASAP?

## 2012-11-29 NOTE — Telephone Encounter (Signed)
New Problem     Appeals for Fatumata are received should be reviewed within the next 7 days.

## 2012-12-04 NOTE — Telephone Encounter (Signed)
PT AWARE BENICAR HAS BEEN APPROVED THRU END OF 2015 ./CY

## 2012-12-05 ENCOUNTER — Encounter: Payer: Medicare Other | Attending: Physical Medicine & Rehabilitation

## 2012-12-05 ENCOUNTER — Ambulatory Visit (HOSPITAL_BASED_OUTPATIENT_CLINIC_OR_DEPARTMENT_OTHER): Payer: Medicare Other | Admitting: Physical Medicine & Rehabilitation

## 2012-12-05 ENCOUNTER — Encounter: Payer: Self-pay | Admitting: Physical Medicine & Rehabilitation

## 2012-12-05 VITALS — BP 145/50 | HR 59 | Resp 14 | Ht 60.0 in | Wt 182.0 lb

## 2012-12-05 DIAGNOSIS — M169 Osteoarthritis of hip, unspecified: Secondary | ICD-10-CM | POA: Insufficient documentation

## 2012-12-05 DIAGNOSIS — M25559 Pain in unspecified hip: Secondary | ICD-10-CM | POA: Insufficient documentation

## 2012-12-05 DIAGNOSIS — M79609 Pain in unspecified limb: Secondary | ICD-10-CM | POA: Insufficient documentation

## 2012-12-05 DIAGNOSIS — M549 Dorsalgia, unspecified: Secondary | ICD-10-CM | POA: Insufficient documentation

## 2012-12-05 DIAGNOSIS — M1611 Unilateral primary osteoarthritis, right hip: Secondary | ICD-10-CM

## 2012-12-05 DIAGNOSIS — M1610 Unilateral primary osteoarthritis, unspecified hip: Secondary | ICD-10-CM | POA: Insufficient documentation

## 2012-12-05 MED ORDER — TRAMADOL HCL 50 MG PO TABS: 50.0000 mg | ORAL_TABLET | Freq: Three times a day (TID) | ORAL | Status: DC | PRN

## 2012-12-05 NOTE — Progress Notes (Signed)
Subjective:    Patient ID: [name], female    DOB: 1931/12/05, 79 y.o.   MRN: [id number]  HPI Right-sided back pain as well as hip and thigh pain. No falls. Pain has persisted despite tramadol twice a day Reviewed interval medical history. Last visit was October 2012 ordered x-rays but patient did not followup No allergies. Has GERD. Not taking any anti-inflammatories. Pain Inventory Average Pain 4 Pain Right Now 4 My pain is constant  In the last 24 hours, has pain interfered with the following? General activity 5 Relation with others 5 Enjoyment of life 5 What TIME of day is your pain at its worst? night Sleep (in general) Fair  Pain is worse with: some activites Pain improves with: medication and TENS Relief from Meds: 5  Mobility how many minutes can you walk? 30 ability to climb steps?  yes do you drive?  no Do you have any goals in this area?  no  Function retired  Neuro/Psych No problems in this area  Prior Studies Any changes since last visit?  no  Physicians involved in your care Any changes since last visit?  no   History reviewed. No pertinent family history. History   Social History  . Marital Status: Married    Spouse Name: N/A    Number of Children: N/A  . Years of Education: N/A   Social History Main Topics  . Smoking status: Never Smoker   . Smokeless tobacco: None  . Alcohol Use: No  . Drug Use:   . Sexually Active:    Other Topics Concern  . None   Social History Narrative  . None   Past Surgical History  Procedure Laterality Date  . Pars plana vitrectomy      right eye  . Posterior capsulectomy      right eye  . Pars plana vitrectomy w/ endophotocoagulation      right eye  . Fiberoptic bronch with endobronchial u/s  07/02/2010  . Video bronchoscopy  12/09/2009  . 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 Bronch with endobronchial biopies  09/15/2009   Past Medical History   Diagnosis Date  . Cancer   . Diabetes mellitus   . Hypertension   . Coronary artery disease   . Aortic insufficiency   . Hyperlipidemia   . Aortic aneurysm   . Hypercholesterolemia   . COPD (chronic obstructive pulmonary disease)   . Bronchitis   . Edema   . GERD (gastroesophageal reflux disease)   . Aortic valve disease     stable  . Pain in joint, forearm   . Pain in joint, hand   . Pain in joint, lower leg   . Difficulty in walking   . Disturbance of skin sensation    BP 145/50  Pulse 59  Resp 14  Ht 5' (1.524 m)  Wt 182 lb (82.555 kg)  BMI 35.54 kg/m2  SpO2 96%     Review of Systems  Respiratory: Positive for cough.   Musculoskeletal: Positive for back pain.  Neurological: Positive for weakness.  All other systems reviewed and are negative.       Objective:   Physical Exam  Constitutional: She appears well-developed.  obese  Eyes: Conjunctivae and EOM are normal. Pupils are equal, round, and reactive to light.  Neurological: She is alert. She has normal strength. Gait abnormal.  Reflex Scores:      Patellar reflexes are 2+ on the right  side and 2+ on the left side.      Achilles reflexes are 2+ on the right side and 2+ on the left side. Negative straight leg raising Antalgic gait favoring right lower extremity  Psychiatric: She has a normal mood and affect.  Reduced range of motion extension lumbar spine however for flexion is full Reduced range of motion right hip internal and external rotation, left is normal     Assessment & Plan:  1. Right hip osteoarthritis seen on prior x-ray becoming more symptomatic. Will trial anti-inflammatories for 3 weeks with physical therapy. Failing this we'll do fluoroscopic guided injection Increase tramadol to 3 times a day

## 2012-12-05 NOTE — Patient Instructions (Signed)
Ibuprofen 200 mg 2 or 3 tablets With food or milk Tramadol 1 tablet 3 times per day Physical therapy 2 times a week for 3 weeks

## 2012-12-13 ENCOUNTER — Other Ambulatory Visit: Payer: Self-pay | Admitting: Obstetrics & Gynecology

## 2012-12-13 DIAGNOSIS — Z1231 Encounter for screening mammogram for malignant neoplasm of breast: Secondary | ICD-10-CM

## 2012-12-14 ENCOUNTER — Telehealth: Payer: Self-pay

## 2012-12-14 NOTE — Telephone Encounter (Signed)
Need to find out where the pain is and schedule injection

## 2012-12-14 NOTE — Telephone Encounter (Signed)
Patient is in increased pain tramadol does not seem to be helping.  Please advise.

## 2012-12-15 NOTE — Telephone Encounter (Signed)
Left message for patient to call and schedule an appointment.

## 2012-12-27 ENCOUNTER — Other Ambulatory Visit: Payer: Self-pay | Admitting: *Deleted

## 2012-12-27 MED ORDER — OLMESARTAN MEDOXOMIL 40 MG PO TABS: 40.0000 mg | ORAL_TABLET | Freq: Two times a day (BID) | ORAL | Status: DC

## 2012-12-30 ENCOUNTER — Telehealth: Payer: Self-pay | Admitting: Physician Assistant

## 2012-12-30 MED ORDER — NIFEDIPINE ER OSMOTIC RELEASE 60 MG PO TB24: 60.0000 mg | ORAL_TABLET | Freq: Every day | ORAL | Status: DC

## 2012-12-30 MED ORDER — INDAPAMIDE 2.5 MG PO TABS: 2.5000 mg | ORAL_TABLET | ORAL | Status: DC

## 2012-12-30 NOTE — Telephone Encounter (Signed)
Patient's daughter called - pt needs Indapamide, Benicar, and Nifedipine refilled. Doses verified. The Benicar was already sent in on 12/27/12. Will defer additional refills on this medicine to Dr. Eden Emms given dose of 40mg  BID. Sent in other prescriptions into [facility] on [address]. Dayna Dunn PA-C

## 2013-01-01 ENCOUNTER — Ambulatory Visit: Payer: Medicare Other | Attending: Physical Medicine & Rehabilitation | Admitting: Physical Therapy

## 2013-01-01 DIAGNOSIS — IMO0001 Reserved for inherently not codable concepts without codable children: Secondary | ICD-10-CM | POA: Insufficient documentation

## 2013-01-01 DIAGNOSIS — M25559 Pain in unspecified hip: Secondary | ICD-10-CM | POA: Insufficient documentation

## 2013-01-01 DIAGNOSIS — M545 Low back pain, unspecified: Secondary | ICD-10-CM | POA: Insufficient documentation

## 2013-01-08 ENCOUNTER — Ambulatory Visit: Payer: Medicare Other | Admitting: Physical Therapy

## 2013-01-08 ENCOUNTER — Encounter: Payer: Medicare Other | Admitting: Physical Therapy

## 2013-01-10 ENCOUNTER — Ambulatory Visit: Payer: Medicare Other

## 2013-01-10 ENCOUNTER — Ambulatory Visit: Payer: Medicare Other | Admitting: Physical Therapy

## 2013-01-15 ENCOUNTER — Ambulatory Visit: Payer: Medicare Other | Admitting: Physical Therapy

## 2013-01-16 ENCOUNTER — Ambulatory Visit: Payer: Medicare Other | Admitting: Physical Medicine & Rehabilitation

## 2013-01-23 ENCOUNTER — Ambulatory Visit: Payer: Medicare Other | Attending: Physical Medicine & Rehabilitation | Admitting: Physical Therapy

## 2013-01-23 DIAGNOSIS — M25559 Pain in unspecified hip: Secondary | ICD-10-CM | POA: Insufficient documentation

## 2013-01-23 DIAGNOSIS — M545 Low back pain, unspecified: Secondary | ICD-10-CM | POA: Insufficient documentation

## 2013-01-23 DIAGNOSIS — IMO0001 Reserved for inherently not codable concepts without codable children: Secondary | ICD-10-CM | POA: Insufficient documentation

## 2013-01-25 ENCOUNTER — Ambulatory Visit: Payer: Medicare Other | Admitting: Physical Therapy

## 2013-02-02 ENCOUNTER — Ambulatory Visit
Admission: RE | Admit: 2013-02-02 | Discharge: 2013-02-02 | Disposition: A | Discharge status: Home / Self Care | Payer: Medicare Other | Source: Ambulatory Visit | Attending: Obstetrics & Gynecology | Admitting: Obstetrics & Gynecology

## 2013-02-02 ENCOUNTER — Ambulatory Visit: Payer: Medicare Other | Admitting: Physical Therapy

## 2013-02-02 DIAGNOSIS — Z1231 Encounter for screening mammogram for malignant neoplasm of breast: Secondary | ICD-10-CM

## 2013-02-05 ENCOUNTER — Ambulatory Visit: Payer: Medicare Other | Admitting: Physical Therapy

## 2013-02-14 ENCOUNTER — Other Ambulatory Visit: Payer: Self-pay | Admitting: Internal Medicine

## 2013-02-15 ENCOUNTER — Ambulatory Visit: Payer: Medicare Other | Admitting: Physical Therapy

## 2013-02-23 ENCOUNTER — Ambulatory Visit: Payer: Medicare Other | Attending: Physical Medicine & Rehabilitation | Admitting: Physical Therapy

## 2013-02-23 DIAGNOSIS — M25559 Pain in unspecified hip: Secondary | ICD-10-CM | POA: Insufficient documentation

## 2013-02-23 DIAGNOSIS — M545 Low back pain, unspecified: Secondary | ICD-10-CM | POA: Insufficient documentation

## 2013-02-23 DIAGNOSIS — IMO0001 Reserved for inherently not codable concepts without codable children: Secondary | ICD-10-CM | POA: Insufficient documentation

## 2013-02-27 ENCOUNTER — Ambulatory Visit: Payer: Medicare Other | Admitting: Physical Therapy

## 2013-03-06 ENCOUNTER — Ambulatory Visit: Payer: Medicare Other | Admitting: Physical Therapy

## 2013-03-06 IMAGING — CR DG CHEST 2V
2 series · 2 of 2 positions shown · non-contrast
Comparison: CT chest dated 09/28/2011.

CLINICAL DATA: Cough

CHEST - 2 VIEW

[view not recorded (1 of 2)]
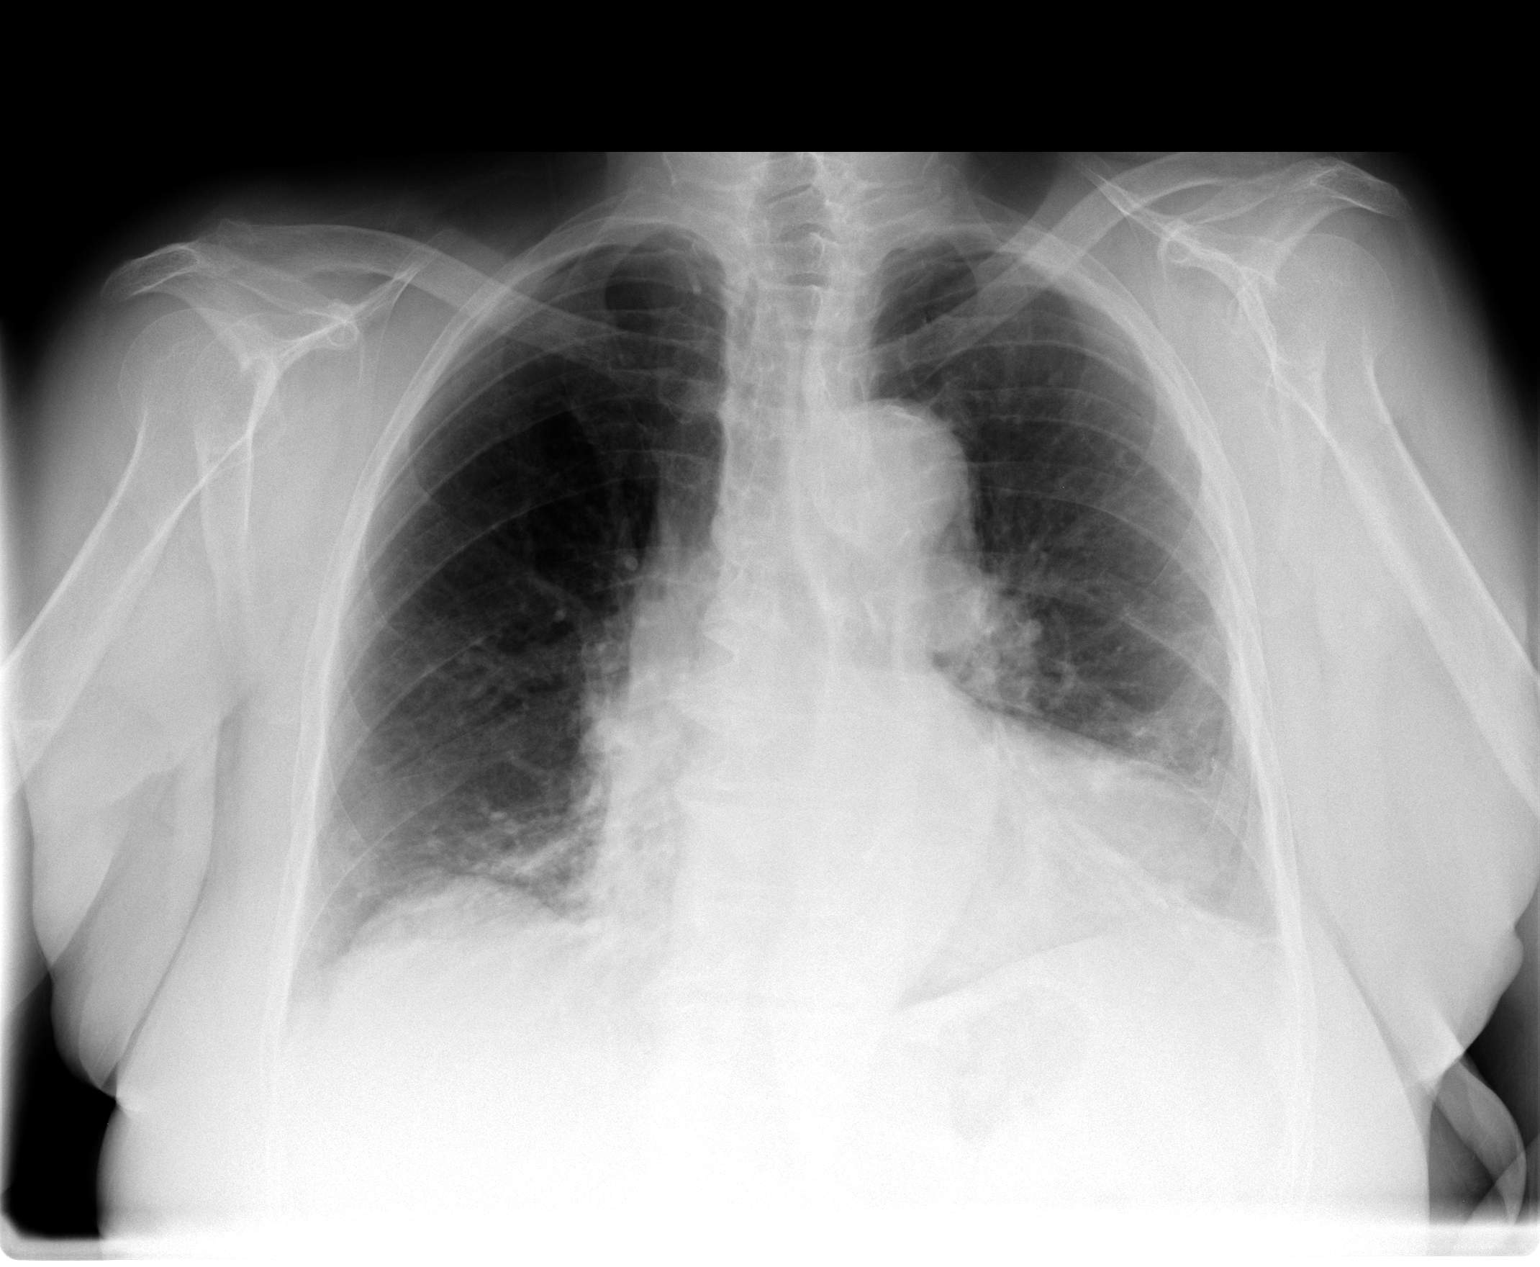

[view not recorded (2 of 2)]
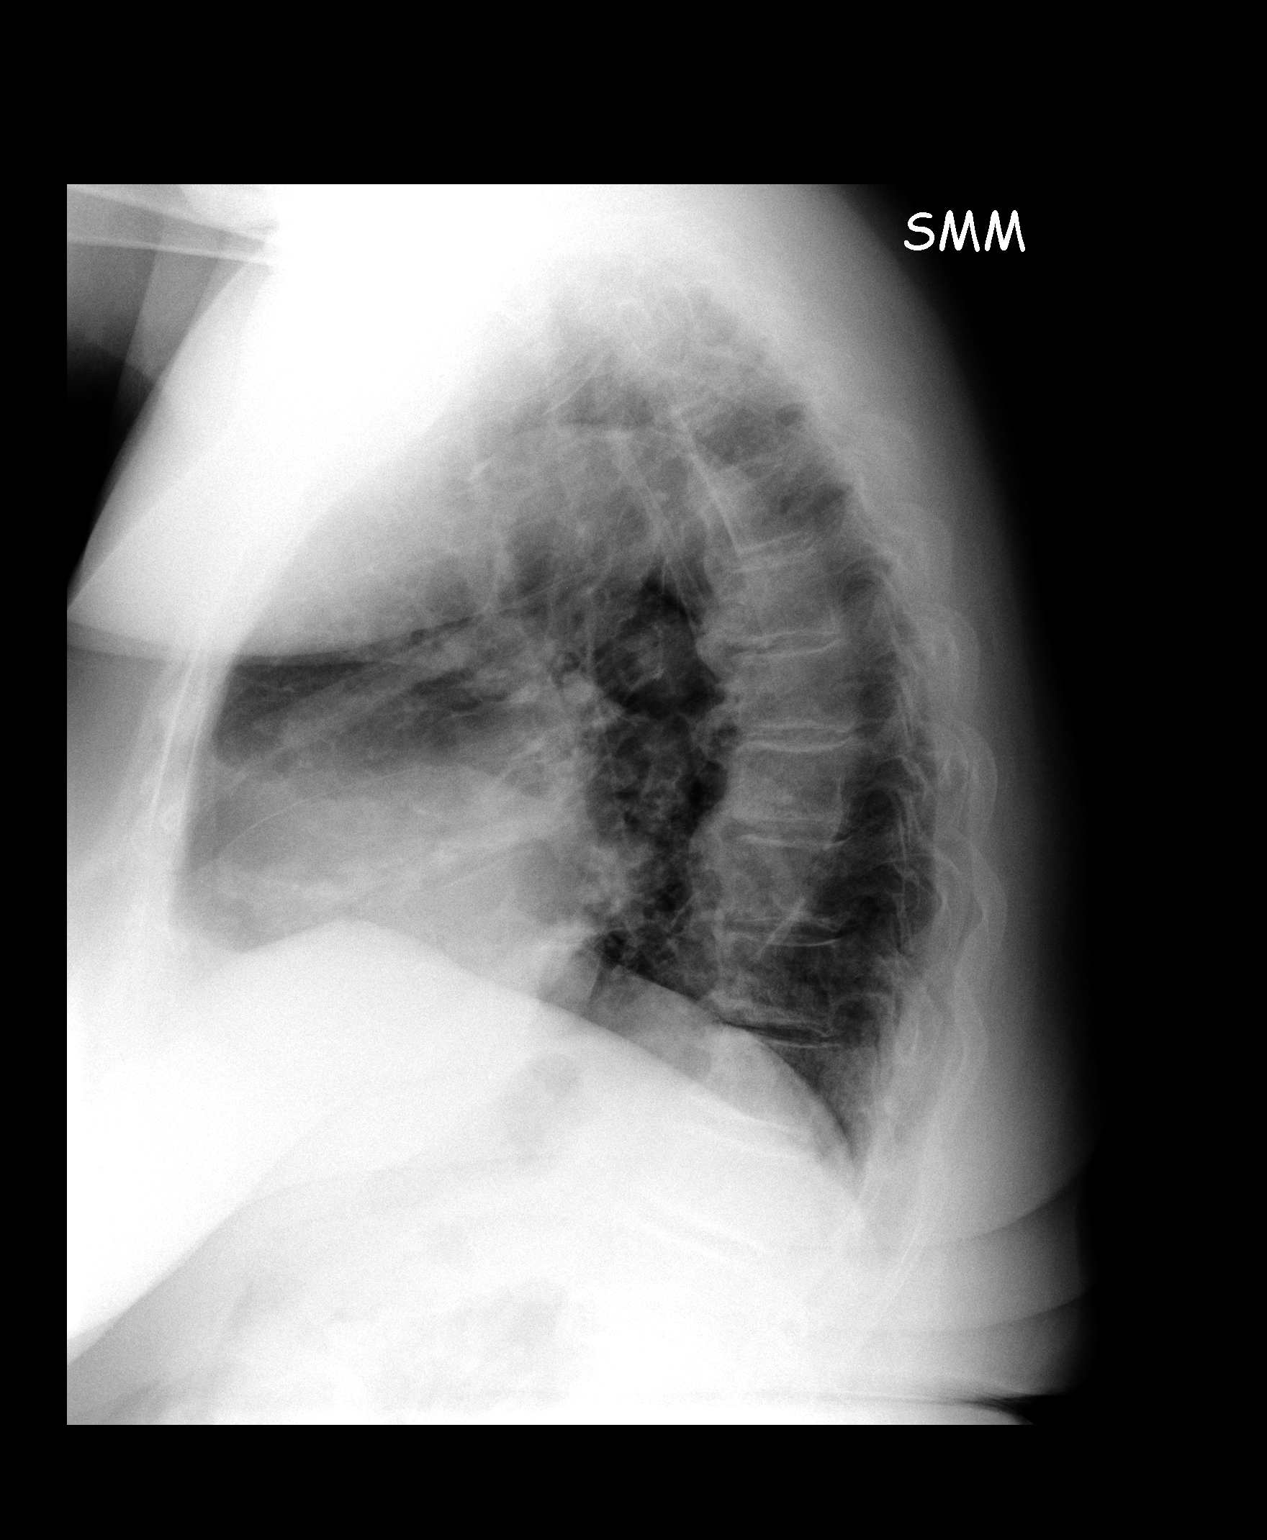

[2 of 2 positions shown; findings below may reference images not displayed]

FINDINGS: Bilateral lower lobe opacities, likely atelectasis /
scarring.  Known numerous bilateral pulmonary nodules are better
demonstrated on CT.

Mild cardiomegaly.

Degenerative changes of the visualized thoracolumbar spine.
IMPRESSION: Bilateral lower lobe opacities, likely atelectasis / scarring.

Known numerous bilateral pulmonary nodules are better demonstrated
on CT.

Mild cardiomegaly.

## 2013-03-08 ENCOUNTER — Ambulatory Visit: Payer: Medicare Other | Admitting: Physical Therapy

## 2013-03-09 DIAGNOSIS — I8390 Asymptomatic varicose veins of unspecified lower extremity: Secondary | ICD-10-CM | POA: Insufficient documentation

## 2013-03-13 ENCOUNTER — Ambulatory Visit: Payer: Medicare Other | Admitting: Physical Therapy

## 2013-03-20 ENCOUNTER — Ambulatory Visit: Payer: Medicare Other | Admitting: Physical Therapy

## 2013-03-21 ENCOUNTER — Ambulatory Visit: Payer: Medicare Other | Admitting: Physical Therapy

## 2013-03-22 ENCOUNTER — Ambulatory Visit: Payer: Medicare Other | Admitting: Physical Therapy

## 2013-03-26 ENCOUNTER — Ambulatory Visit: Payer: Medicare Other | Attending: Physical Medicine & Rehabilitation | Admitting: Physical Therapy

## 2013-03-26 DIAGNOSIS — M25559 Pain in unspecified hip: Secondary | ICD-10-CM | POA: Insufficient documentation

## 2013-03-26 DIAGNOSIS — IMO0001 Reserved for inherently not codable concepts without codable children: Secondary | ICD-10-CM | POA: Insufficient documentation

## 2013-03-26 DIAGNOSIS — M545 Low back pain, unspecified: Secondary | ICD-10-CM | POA: Insufficient documentation

## 2013-03-27 ENCOUNTER — Ambulatory Visit: Payer: Medicare Other | Admitting: Physical Therapy

## 2013-04-02 ENCOUNTER — Ambulatory Visit: Payer: Medicare Other | Admitting: Physical Therapy

## 2013-04-04 DIAGNOSIS — J4520 Mild intermittent asthma, uncomplicated: Secondary | ICD-10-CM | POA: Insufficient documentation

## 2013-04-05 ENCOUNTER — Ambulatory Visit: Payer: Medicare Other | Admitting: Physical Therapy

## 2013-04-11 ENCOUNTER — Other Ambulatory Visit: Payer: Self-pay

## 2013-04-11 ENCOUNTER — Ambulatory Visit: Payer: Medicare Other | Admitting: Physical Therapy

## 2013-04-11 DIAGNOSIS — D381 Neoplasm of uncertain behavior of trachea, bronchus and lung: Secondary | ICD-10-CM

## 2013-04-12 ENCOUNTER — Ambulatory Visit: Payer: Medicare Other | Admitting: Physical Therapy

## 2013-04-18 ENCOUNTER — Ambulatory Visit: Payer: Medicare Other | Admitting: Physical Therapy

## 2013-04-23 ENCOUNTER — Other Ambulatory Visit: Payer: Self-pay | Admitting: Physician Assistant

## 2013-04-24 ENCOUNTER — Ambulatory Visit: Payer: Medicare Other | Admitting: Physical Therapy

## 2013-05-03 ENCOUNTER — Ambulatory Visit: Payer: Medicare Other | Attending: Physical Medicine & Rehabilitation | Admitting: Physical Therapy

## 2013-05-03 DIAGNOSIS — M545 Low back pain, unspecified: Secondary | ICD-10-CM | POA: Insufficient documentation

## 2013-05-03 DIAGNOSIS — IMO0001 Reserved for inherently not codable concepts without codable children: Secondary | ICD-10-CM | POA: Insufficient documentation

## 2013-05-03 DIAGNOSIS — M25559 Pain in unspecified hip: Secondary | ICD-10-CM | POA: Insufficient documentation

## 2013-05-04 ENCOUNTER — Ambulatory Visit: Payer: Medicare Other | Admitting: Physical Therapy

## 2013-05-07 ENCOUNTER — Other Ambulatory Visit: Payer: Self-pay | Admitting: Physician Assistant

## 2013-05-08 ENCOUNTER — Ambulatory Visit: Payer: Medicare Other | Admitting: Physical Therapy

## 2013-05-09 ENCOUNTER — Ambulatory Visit: Payer: Medicare Other | Admitting: Physical Therapy

## 2013-05-14 ENCOUNTER — Ambulatory Visit: Payer: Medicare Other | Admitting: Physical Therapy

## 2013-05-15 ENCOUNTER — Ambulatory Visit (INDEPENDENT_AMBULATORY_CARE_PROVIDER_SITE_OTHER): Payer: Medicare Other | Admitting: Thoracic Surgery (Cardiothoracic Vascular Surgery)

## 2013-05-15 ENCOUNTER — Encounter: Payer: Self-pay | Admitting: Thoracic Surgery (Cardiothoracic Vascular Surgery)

## 2013-05-15 ENCOUNTER — Ambulatory Visit
Admission: RE | Admit: 2013-05-15 | Discharge: 2013-05-15 | Disposition: A | Discharge status: Home / Self Care | Payer: Medicare Other | Source: Ambulatory Visit | Attending: Thoracic Surgery (Cardiothoracic Vascular Surgery) | Admitting: Thoracic Surgery (Cardiothoracic Vascular Surgery)

## 2013-05-15 VITALS — BP 123/66 | HR 76 | Resp 20 | Ht 60.0 in | Wt 182.0 lb

## 2013-05-15 DIAGNOSIS — I712 Thoracic aortic aneurysm, without rupture, unspecified: Secondary | ICD-10-CM

## 2013-05-15 DIAGNOSIS — R918 Other nonspecific abnormal finding of lung field: Secondary | ICD-10-CM

## 2013-05-15 DIAGNOSIS — D381 Neoplasm of uncertain behavior of trachea, bronchus and lung: Secondary | ICD-10-CM

## 2013-05-15 DIAGNOSIS — D3A00 Benign carcinoid tumor of unspecified site: Secondary | ICD-10-CM

## 2013-05-15 NOTE — Progress Notes (Signed)
HPI:  [name] returns today for a scheduled 6 month followup visit.   She is a 79 year old woman with a history of multiple carcinoid syndrome. She previously was followed by Dr. Edwyna Shell.   Dr. Edwyna Shell had done and endoscopic resection of a right upper lobe tumor with laser bronchoscopy in December of 2010. He had done followup video bronchoscopies in February and September of 2011, which showed some scarring at the area of previous resection but no recurrent tumor. She has multiple bilateral lung nodules which been followed with CT scans at six-month intervals.  She is accompanied by her daughter who provides interpretation.  Since her last visit [name] has been doing well. She has an occasional cough but it is decreased from her previous visit. Her cough has been nonproductive. She does get short of breath with exertion, but her breathing is stable. She is able to take care of her activities of daily living.  She denies any chest pain. She has some mild swelling in her legs when she is up on her feet for a long time.  Past Medical History  Diagnosis Date  . Cancer   . Diabetes mellitus   . Hypertension   . Coronary artery disease   . Aortic insufficiency   . Hyperlipidemia   . Aortic aneurysm   . Hypercholesterolemia   . COPD (chronic obstructive pulmonary disease)   . Bronchitis   . Edema   . GERD (gastroesophageal reflux disease)   . Aortic valve disease     stable  . Pain in joint, forearm   . Pain in joint, hand   . Pain in joint, lower leg   . Difficulty in walking(719.7)   . Disturbance of skin sensation        Current Outpatient Prescriptions  Medication Sig Dispense Refill  . acetaminophen (TYLENOL) 325 MG tablet Take 325 mg by mouth every 6 (six) hours as needed. For pain      . ADVAIR HFA 230-21 MCG/ACT inhaler Inhale 2 puffs into the lungs 2 (two) times daily.       Marland Kitchen albuterol (PROVENTIL HFA) 108 (90 BASE) MCG/ACT inhaler Inhale 2 puffs into the  lungs every 6 (six) hours as needed. For wheezing      . aspirin 81 MG tablet Take 81 mg by mouth daily.        Marland Kitchen FeFum-FePoly-FA-B Cmp-C-Biot (INTEGRA PLUS) CAPS TAKE 1 CAPSULE BY MOUTH EVERY DAY  30 capsule  5  . furosemide (LASIX) 20 MG tablet Take 1 tablet (20 mg total) by mouth daily.  90 tablet  3  . glimepiride (AMARYL) 1 MG tablet Take 1 mg by mouth daily.        . indapamide (LOZOL) 2.5 MG tablet TAKE 1 TABLET BY MOUTH EVERY MORNING  30 tablet  2  . montelukast (SINGULAIR) 10 MG tablet       . Multiple Vitamin (MULITIVITAMIN WITH MINERALS) TABS Take 1 tablet by mouth daily.      Marland Kitchen NIFEdipine (PROCARDIA-XL/ADALAT CC) 60 MG 24 hr tablet TAKE 1 TABLET BY MOUTH EVERY DAY  30 tablet  3  . nitroGLYCERIN (NITROSTAT) 0.4 MG SL tablet Place 1 tablet (0.4 mg total) under the tongue every 5 (five) minutes as needed. For chest pain  25 tablet  4  . olmesartan (BENICAR) 40 MG tablet Take 1 tablet (40 mg total) by mouth 2 (two) times daily.  60 tablet  12  . omeprazole (PRILOSEC) 20 MG capsule Take 20 mg  by mouth daily.      . pioglitazone (ACTOS) 15 MG tablet Take 15 mg by mouth daily.      . potassium chloride (K-DUR,KLOR-CON) 10 MEQ tablet Take 20 mEq by mouth daily.      . rosuvastatin (CRESTOR) 20 MG tablet Take 10 mg by mouth daily.       No current facility-administered medications for this visit.    Physical Exam BP 123/66  Pulse 76  Resp 20  Ht 5' (1.524 m)  Wt 182 lb (82.555 kg)  BMI 35.54 kg/m2  SpO2 94% Morbidly obese 79 year old woman in no acute distress Neurologic alert with no focal deficits Neck no carotid bruits Cardiac regular rate and rhythm 2/6 diastolic murmur Lungs diminished but equal breath sounds bilaterally, no wheezing  Diagnostic Tests: CT chest *RADIOLOGY REPORT*  Clinical Data: History of lung cancer. Follow-up evaluation of  lung nodules. Ongoing cough.  CT CHEST WITHOUT CONTRAST  Technique: Multidetector CT imaging of the chest was performed   following the standard protocol without IV contrast.  Comparison: Chest CT 11/07/2012.  Findings:  Mediastinum: Heart size is moderately enlarged. There is no  significant pericardial fluid, thickening or pericardial  calcification. There is atherosclerosis of the thoracic aorta, the  great vessels of the mediastinum and the coronary arteries,  including calcified atherosclerotic plaque in the left main, left  anterior descending, left circumflex and right coronary arteries.  Extensive calcifications of the mitral annulus and the aortic  valve. Ascending thoracic aorta is aneurysmal and appears to have  increased in size compared to the prior study, currently measuring  approximately 5.7 cm. The thoracic aortic arch is also ectatic  measuring 3.5 cm in diameter, and the descending thoracic aorta is  slightly prominent measuring 3 cm in diameter.No pathologically  enlarged mediastinal or hilar lymph nodes. Esophagus is  unremarkable in appearance.  Lungs/Pleura: Postoperative changes of wedge resection are noted in  the posterior aspect of the left upper lobe. There are again  innumerable small pulmonary nodules scattered throughout the lungs  bilaterally. These appear very similar in size, number and pattern  of distribution. The largest single nodule is macrolobulated  measuring 1.6 x 1.5 cm in the inferior aspect of the right lower  lobe (image 37 of series 4), but additional prominent nodules  include 8 mm nodules in the left lower lobe and anterolateral  aspect of the right lower lobe demonstrated on image 34 of series  4. No definite enlarging suspicious appearing pulmonary nodules or  masses are otherwise identified. There is evidence of some mild  air trapping in the lungs bilaterally, particularly in the medial  aspect of the right upper lobe, suggesting some small airways  disease. No acute consolidative airspace disease. No pleural  effusions.  Upper Abdomen: Unremarkable.   Musculoskeletal: There are no aggressive appearing lytic or blastic  lesions noted in the visualized portions of the skeleton.  IMPRESSION:  1. No significant change in the numerous bilateral pulmonary  nodules compared to the prior examination. These nodules are also  essentially unchanged in retrospect compared to remote prior study  from 11/25/2010, and accordingly, given their greater than 2 years  of stability these are strongly favored to be benign. Given the  patient's known history of prior carcinoid tumor, however, the  possibility that any one of these nodules could represent a slow  growing tumor such as a carcinoid tumor is difficult to entirely  exclude (although not favored).  2. Atherosclerosis, including left  main and three-vessel coronary  artery disease. In addition, the patient has an ascending thoracic  aortic aneurysm which appears increased in size compared to the  prior study, currently measuring up to 5.7 cm in diameter.  3. Evidence of mild air trapping in the lungs bilaterally.  Original Report Authenticated By: Trudie Reed, M.D.   Impression: 79 year old woman with multiple carcinoid syndrome and in the ascending aortic aneurysm.  1. Multiple carcinoid syndrome-she has multiple lung nodules that are stable  2. Ascending aortic aneurysm-previously noted to be 5.0 cm in maximal diameter on CT scan. The report today reads 5.7 cm in maximal diameter. I reviewed the films very carefully and compared them side by side with the films from 6 months ago. In my opinion the aneurysm has not changed in size. It was clearly measured at a different spot on the ascending aorta. It appears that the spot that was measured is within the sinuses of Valsalva on an oblique angle to the true diameter the aorta. This is evident because of the coronary calcification in the LAD clearly seen on the same slice as the ruler. When measured at this exact site of the previous scan it  measured 5.5-5.6 cm. In the descending aorta above the sinuses of Valsalva looking at both coronal and sagittal slices in addition to the conventional cross sections it appears to diameter is approximately 5.0 cm.  I long discussion with the patient and her daughter about the findings on CT in regards to the aneurysm and reviewed the films side-by-side with them. They understand that she is at risk for rupture or dissection with an aneurysm of this size, but that in my opinion is no different than her risk was at her previous visit. While that risk is substantial her risk with surgery would also be extremely high. I do think this bears close watching. She will need another CT in 6 months so we will do that with contrast so that we can get a better idea of the true diameter of the aorta.  She has an appointment with Dr. Eden Emms in about a month and can get his opinion at that time.  Plan: Return in 6 months with CT angiogram of the chest

## 2013-05-16 ENCOUNTER — Ambulatory Visit: Payer: Medicare Other | Admitting: Physical Therapy

## 2013-05-28 ENCOUNTER — Telehealth: Payer: Self-pay | Admitting: Internal Medicine

## 2013-05-28 NOTE — Telephone Encounter (Signed)
Pt dtr called and moved 9/9 lb to 2pm and 9/11 f/u to 9/15. dtr has new d/t's and will call [facility] rad to change time of cxr.

## 2013-07-03 ENCOUNTER — Other Ambulatory Visit (HOSPITAL_COMMUNITY): Payer: Medicare Other

## 2013-07-03 ENCOUNTER — Telehealth: Payer: Self-pay | Admitting: Medical Oncology

## 2013-07-03 ENCOUNTER — Other Ambulatory Visit (HOSPITAL_BASED_OUTPATIENT_CLINIC_OR_DEPARTMENT_OTHER): Payer: Medicare Other | Admitting: Lab

## 2013-07-03 ENCOUNTER — Ambulatory Visit (HOSPITAL_COMMUNITY)
Admission: RE | Admit: 2013-07-03 | Discharge: 2013-07-03 | Disposition: A | Discharge status: Home / Self Care | Payer: Medicare Other | Source: Ambulatory Visit | Attending: Internal Medicine | Admitting: Internal Medicine

## 2013-07-03 DIAGNOSIS — M1610 Unilateral primary osteoarthritis, unspecified hip: Secondary | ICD-10-CM | POA: Insufficient documentation

## 2013-07-03 DIAGNOSIS — D3A00 Benign carcinoid tumor of unspecified site: Secondary | ICD-10-CM

## 2013-07-03 LAB — CBC WITH DIFFERENTIAL/PLATELET
BASO%: 0.4 % (ref 0.0–2.0)
Basophils Absolute: 0 10*3/uL (ref 0.0–0.1)
Eosinophils Absolute: 0.1 10*3/uL (ref 0.0–0.5)
HCT: 36.6 % (ref 34.8–46.6)
HGB: 12.3 g/dL (ref 11.6–15.9)
MONO#: 0.5 10*3/uL (ref 0.1–0.9)
NEUT%: 69.5 % (ref 38.4–76.8)
WBC: 7 10*3/uL (ref 3.9–10.3)
lymph#: 1.5 10*3/uL (ref 0.9–3.3)

## 2013-07-03 LAB — COMPREHENSIVE METABOLIC PANEL (CC13)
ALT: 14 U/L (ref 0–55)
BUN: 20 mg/dL (ref 7.0–26.0)
CO2: 29 mEq/L (ref 22–29)
Calcium: 9.2 mg/dL (ref 8.4–10.4)
Chloride: 103 mEq/L (ref 98–109)
Creatinine: 0.8 mg/dL (ref 0.6–1.1)
Glucose: 160 mg/dl — ABNORMAL HIGH (ref 70–140)

## 2013-07-03 NOTE — Telephone Encounter (Signed)
Pt only had a 1 view abd xray. Does Dr Arbutus Ped want CXR. Per Dr Arbutus Ped pt needs CXR -order sent

## 2013-07-04 ENCOUNTER — Telehealth: Payer: Self-pay | Admitting: Internal Medicine

## 2013-07-04 NOTE — Telephone Encounter (Signed)
lvm for pt the MD would like for her to have cxr done b4 visit on 9.15

## 2013-07-04 NOTE — Telephone Encounter (Signed)
lvm for pt regarding to 9.15 appt moved to 9.22.14 due to Dr MM on call

## 2013-07-05 ENCOUNTER — Ambulatory Visit: Payer: Medicare Other | Admitting: Internal Medicine

## 2013-07-09 ENCOUNTER — Other Ambulatory Visit (HOSPITAL_COMMUNITY): Payer: Medicare Other

## 2013-07-09 ENCOUNTER — Ambulatory Visit: Payer: Medicare Other | Admitting: Internal Medicine

## 2013-07-12 ENCOUNTER — Ambulatory Visit (INDEPENDENT_AMBULATORY_CARE_PROVIDER_SITE_OTHER): Payer: Medicare Other | Admitting: Cardiovascular Disease

## 2013-07-12 ENCOUNTER — Encounter: Payer: Self-pay | Admitting: Cardiovascular Disease

## 2013-07-12 ENCOUNTER — Ambulatory Visit (HOSPITAL_COMMUNITY)
Admission: RE | Admit: 2013-07-12 | Discharge: 2013-07-12 | Disposition: A | Discharge status: Home / Self Care | Payer: Medicare Other | Source: Ambulatory Visit | Attending: Internal Medicine | Admitting: Internal Medicine

## 2013-07-12 VITALS — BP 124/62 | HR 72 | Wt 174.0 lb

## 2013-07-12 DIAGNOSIS — J984 Other disorders of lung: Secondary | ICD-10-CM | POA: Insufficient documentation

## 2013-07-12 DIAGNOSIS — D3A00 Benign carcinoid tumor of unspecified site: Secondary | ICD-10-CM

## 2013-07-12 DIAGNOSIS — I10 Essential (primary) hypertension: Secondary | ICD-10-CM

## 2013-07-12 DIAGNOSIS — Z859 Personal history of malignant neoplasm, unspecified: Secondary | ICD-10-CM | POA: Insufficient documentation

## 2013-07-12 DIAGNOSIS — E785 Hyperlipidemia, unspecified: Secondary | ICD-10-CM

## 2013-07-12 DIAGNOSIS — I2510 Atherosclerotic heart disease of native coronary artery without angina pectoris: Secondary | ICD-10-CM

## 2013-07-12 DIAGNOSIS — J449 Chronic obstructive pulmonary disease, unspecified: Secondary | ICD-10-CM | POA: Insufficient documentation

## 2013-07-12 DIAGNOSIS — J4489 Other specified chronic obstructive pulmonary disease: Secondary | ICD-10-CM | POA: Insufficient documentation

## 2013-07-12 DIAGNOSIS — I359 Nonrheumatic aortic valve disorder, unspecified: Secondary | ICD-10-CM

## 2013-07-12 DIAGNOSIS — I719 Aortic aneurysm of unspecified site, without rupture: Secondary | ICD-10-CM

## 2013-07-12 MED ORDER — FUROSEMIDE 40 MG PO TABS: 40.0000 mg | ORAL_TABLET | ORAL | Status: DC | PRN

## 2013-07-12 NOTE — Progress Notes (Signed)
Patient ID: [name], female   DOB: November 16, 1931, 79 y.o.   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 I reviewed a letter from Dr. Edwyna Shell indicating that her ascending aorta had reached 5 cm. She was referred to [facility] for further evaluation. She does not strike me as an ideal operative candidate. Her hypertension has been under good control. She has had her norvas stopped and Procardia increased to 60 mg which I think is fine. She has significant lower extremity edema from varicose veins and this is harder to treat while on this particular oral hypoglycemic. Otherwise she's not had any significant chest pain. She has mild chronic edema she has mild chronic exertional dyspnea. I believe she sees Dr. Bernette Mayers for her carcinoid  CT done in June showed throracic aneurysm of 5.7 cm Seen by Dr Dorris Fetch and not thought to be an operative candidate     Study Conclusions  - Left ventricle: The cavity size was normal. Wall thickness was increased in a pattern of moderate LVH. Systolic function was normal. The estimated ejection fraction was in the range of 55% to 60%. Wall motion was normal; there were no regional wall motion abnormalities. - Aortic valve: Sclerosis without stenosis. Moderate regurgitation. - Mitral valve: Calcified annulus. Mild regurgitation. - Right ventricle: The cavity size was normal. Systolic function was normal. - Right atrium: The atrium was mildly dilated. - Pulmonary arteries: PA peak pressure: 50mm Hg (S).  Noncontrast CT 5/13 With fairly stable nodules  Contrast CT 1/12 Aorta 4.9 cm  IMPRESSION: Multiple pulmonary nodules of varying sizes which appear relatively stable. The only nodule which does appear to have increased somewhat lies just above the right hemidiaphragm measuring 18 mm compared to 14 mm previously.  ROS: Denies  fever, malais, weight loss, blurry vision, decreased visual acuity, cough, sputum, SOB, hemoptysis, pleuritic pain, palpitaitons, heartburn, abdominal pain, melena, lower extremity edema, claudication, or rash.  All other systems reviewed and negative  General: Affect appropriate Healthy:  appears stated age HEENT: normal Neck supple with no adenopathy JVP normal no bruits no thyromegaly Lungs clear with no wheezing and good diaphragmatic motion Heart:  S1/S2 no murmur, no rub, gallop or click PMI normal Abdomen: benighn, BS positve, no tenderness, no AAA no bruit.  No HSM or HJR Distal pulses intact with no bruits No edema Neuro non-focal Skin warm and dry No muscular weakness   Current Outpatient Prescriptions  Medication Sig Dispense Refill  . acetaminophen (TYLENOL) 325 MG tablet Take 325 mg by mouth every 6 (six) hours as needed. For pain      . ADVAIR HFA 230-21 MCG/ACT inhaler Inhale 2 puffs into the lungs 2 (two) times daily.       Marland Kitchen albuterol (PROVENTIL HFA) 108 (90 BASE) MCG/ACT inhaler Inhale 2 puffs into the lungs every 6 (six) hours as needed. For wheezing      . aspirin 81 MG tablet Take 81 mg by mouth daily.        Marland Kitchen FeFum-FePoly-FA-B Cmp-C-Biot (INTEGRA PLUS) CAPS TAKE 1 CAPSULE BY MOUTH EVERY DAY  30 capsule  5  . furosemide (LASIX) 40 MG tablet Take 40 mg by mouth as needed.      Marland Kitchen glimepiride (AMARYL) 1 MG tablet Take 1 mg by mouth daily.        . indapamide (LOZOL) 2.5 MG tablet TAKE 1 TABLET BY MOUTH EVERY MORNING  30  tablet  2  . Multiple Vitamin (MULITIVITAMIN WITH MINERALS) TABS Take 1 tablet by mouth daily.      Marland Kitchen NIFEdipine (PROCARDIA-XL/ADALAT CC) 60 MG 24 hr tablet TAKE 1 TABLET BY MOUTH EVERY DAY  30 tablet  3  . nitroGLYCERIN (NITROSTAT) 0.4 MG SL tablet Place 1 tablet (0.4 mg total) under the tongue every 5 (five) minutes as needed. For chest pain  25 tablet  4  . olmesartan (BENICAR) 40 MG tablet Take 1 tablet (40 mg total) by mouth 2 (two) times  daily.  60 tablet  12  . omeprazole (PRILOSEC) 20 MG capsule Take 20 mg by mouth daily.      . pioglitazone (ACTOS) 15 MG tablet Take 15 mg by mouth daily.      . potassium chloride (K-DUR,KLOR-CON) 10 MEQ tablet Take 20 mEq by mouth daily.      . rosuvastatin (CRESTOR) 20 MG tablet Take 10 mg by mouth daily.       No current facility-administered medications for this visit.    Allergies  Review of patient's allergies indicates no known allergies.  Electrocardiogram: sr rate 82  PR 288 PAC LAFB LVH  Assessment and Plan

## 2013-07-12 NOTE — Assessment & Plan Note (Signed)
F/U Dr Dorris Fetch Does not appear to be a surgical candidate

## 2013-07-12 NOTE — Assessment & Plan Note (Signed)
Stable with no angina and good activity level.  Continue medical Rx  

## 2013-07-12 NOTE — Assessment & Plan Note (Signed)
No change in recent CT with multiple nodules  F/U Dr Shelle Iron

## 2013-07-12 NOTE — Assessment & Plan Note (Signed)
Cholesterol is at goal.  Continue current dose of statin and diet Rx.  No myalgias or side effects.  F/U  LFT's in 6 months. No results found for this basename: LDLCALC             

## 2013-07-12 NOTE — Patient Instructions (Addendum)
Your physician wants you to follow-up in: YEAR WITH DR NISHAN  You will receive a reminder letter in the mail two months in advance. If you don't receive a letter, please call our office to schedule the follow-up appointment.  Your physician recommends that you continue on your current medications as directed. Please refer to the Current Medication list given to you today. 

## 2013-07-12 NOTE — Assessment & Plan Note (Signed)
Well controlled.  Continue current medications and low sodium Dash type diet.    

## 2013-07-16 ENCOUNTER — Telehealth: Payer: Self-pay | Admitting: Internal Medicine

## 2013-07-16 ENCOUNTER — Ambulatory Visit: Payer: Medicare Other | Admitting: Internal Medicine

## 2013-07-16 NOTE — Telephone Encounter (Signed)
Pt called and r/s MD to 10/13

## 2013-07-18 ENCOUNTER — Other Ambulatory Visit: Payer: Self-pay | Admitting: Cardiovascular Disease

## 2013-07-30 ENCOUNTER — Other Ambulatory Visit: Payer: Self-pay | Admitting: Cardiovascular Disease

## 2013-08-06 ENCOUNTER — Encounter (INDEPENDENT_AMBULATORY_CARE_PROVIDER_SITE_OTHER): Payer: Self-pay

## 2013-08-06 ENCOUNTER — Encounter: Payer: Self-pay | Admitting: Internal Medicine

## 2013-08-06 ENCOUNTER — Ambulatory Visit (HOSPITAL_BASED_OUTPATIENT_CLINIC_OR_DEPARTMENT_OTHER): Payer: Medicare Other | Admitting: Internal Medicine

## 2013-08-06 VITALS — BP 154/62 | HR 68 | Temp 97.8°F | Resp 20 | Ht 60.0 in | Wt 177.5 lb

## 2013-08-06 DIAGNOSIS — C7A090 Malignant carcinoid tumor of the bronchus and lung: Secondary | ICD-10-CM

## 2013-08-06 DIAGNOSIS — D3A00 Benign carcinoid tumor of unspecified site: Secondary | ICD-10-CM

## 2013-08-06 NOTE — Patient Instructions (Signed)
Followup visit in one year with repeat chest x-ray.

## 2013-08-06 NOTE — Progress Notes (Signed)
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   PRIOR THERAPY:  1. Status post resection of multiple nodules from the left lung under the care of Dr Edwyna Shell on July 15, 2005. 2. Status post repeat bronchoscopy with endobronchial excision of the right upper lobe tumor and laser bronchoscopy under the care of Dr Edwyna Shell on October 08, 2009.  CURRENT THERAPY: Observation.  INTERVAL HISTORY: [name] 79 y.o. female returns to the clinic today for annual followup visit accompanied by her daughter. The patient is feeling fine today with no specific complaints except for occasional dry cough. She denied having any significant chest pain, shortness breath or hemoptysis. The patient denied having any nausea or vomiting. She has no significant weight loss or night sweats. She had repeat chest x-ray performed recently and she is here for evaluation and discussion of her imaging and lab results.   MEDICAL HISTORY: Past Medical History  Diagnosis Date  . Cancer   . Diabetes mellitus   . Hypertension   . Coronary artery disease   . Aortic insufficiency   . Hyperlipidemia   . Aortic aneurysm   . Hypercholesterolemia   . COPD (chronic obstructive pulmonary disease)   . Bronchitis   . Edema   . GERD (gastroesophageal reflux disease)   . Aortic valve disease     stable  . Pain in joint, forearm   . Pain in joint, hand   . Pain in joint, lower leg   . Difficulty in walking(719.7)   . Disturbance of skin sensation     ALLERGIES:  has No Known Allergies.  MEDICATIONS:  Current Outpatient Prescriptions  Medication Sig Dispense Refill  . acetaminophen (TYLENOL) 325 MG tablet Take 325 mg by mouth every 6 (six) hours as needed. For pain      . ADVAIR HFA 230-21 MCG/ACT inhaler Inhale 2 puffs into the lungs 2 (two) times  daily.       Marland Kitchen albuterol (PROVENTIL HFA) 108 (90 BASE) MCG/ACT inhaler Inhale 2 puffs into the lungs every 6 (six) hours as needed. For wheezing      . aspirin 81 MG tablet Take 81 mg by mouth daily.        Marland Kitchen FeFum-FePoly-FA-B Cmp-C-Biot (INTEGRA PLUS) CAPS TAKE 1 CAPSULE BY MOUTH EVERY DAY  30 capsule  5  . furosemide (LASIX) 20 MG tablet TAKE 1 TABLET BY MOUTH EVERY DAY  90 tablet  3  . glimepiride (AMARYL) 1 MG tablet Take 1 mg by mouth daily.        . indapamide (LOZOL) 2.5 MG tablet TAKE 1 TABLET BY MOUTH EVERY MORNING  30 tablet  6  . Multiple Vitamin (MULITIVITAMIN WITH MINERALS) TABS Take 1 tablet by mouth daily.      Marland Kitchen NIFEdipine (PROCARDIA-XL/ADALAT CC) 60 MG 24 hr tablet TAKE 1 TABLET BY MOUTH EVERY DAY  30 tablet  3  . NITROSTAT 0.4 MG SL tablet PLACE 1 TABLET UNDER THE TONGUE EVERY 5 MINUTES AS NEEDED FOR CHEST PAIN  25 tablet  2  . olmesartan (BENICAR) 40 MG tablet Take 1 tablet (40 mg total) by mouth 2 (two) times daily.  60 tablet  12  . omeprazole (PRILOSEC) 20 MG capsule Take 20 mg by mouth daily.      . pioglitazone (ACTOS) 15 MG tablet Take 15 mg  by mouth daily.      . potassium chloride (K-DUR,KLOR-CON) 10 MEQ tablet Take 20 mEq by mouth daily.      . rosuvastatin (CRESTOR) 20 MG tablet Take 10 mg by mouth daily.       No current facility-administered medications for this visit.    SURGICAL HISTORY:  Past Surgical History  Procedure Laterality Date  . Pars plana vitrectomy      right eye  . Posterior capsulectomy      right eye  . Pars plana vitrectomy w/ endophotocoagulation      right eye  . Fiberoptic bronch with endobronchial u/s  07/02/2010  . Video bronchoscopy  12/09/2009  . 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 Bronch with endobronchial biopies  09/15/2009    REVIEW OF SYSTEMS:  A comprehensive review of systems was negative except for: Respiratory: positive for cough   PHYSICAL EXAMINATION: General  appearance: alert, cooperative and no distress Head: Normocephalic, without obvious abnormality, atraumatic Neck: no adenopathy, no JVD, supple, symmetrical, trachea midline and thyroid not enlarged, symmetric, no tenderness/mass/nodules Lymph nodes: Cervical, supraclavicular, and axillary nodes normal. Resp: clear to auscultation bilaterally Cardio: normal apical impulse GI: soft, non-tender; bowel sounds normal; no masses,  no organomegaly Extremities: extremities normal, atraumatic, no cyanosis or edema  ECOG PERFORMANCE STATUS: 1 - Symptomatic but completely ambulatory  Blood pressure 154/62, pulse 68, temperature 97.8 F (36.6 C), temperature source Oral, resp. rate 20, height 5' (1.524 m), weight 177 lb 8 oz (80.513 kg).  LABORATORY DATA: Lab Results  Component Value Date   WBC 7.0 07/03/2013   HGB 12.3 07/03/2013   HCT 36.6 07/03/2013   MCV 90.7 07/03/2013   PLT 223 07/03/2013      Chemistry      Component Value Date/Time   NA 141 07/03/2013 1346   NA 137 06/12/2012 2124   NA 144 12/02/2011 1053   K 3.9 07/03/2013 1346   K 4.0 06/12/2012 2124   K 3.7 12/02/2011 1053   CL 104 07/07/2012 1435   CL 98 06/12/2012 2124   CL 100 12/02/2011 1053   CO2 29 07/03/2013 1346   CO2 28 06/12/2012 2124   CO2 30 12/02/2011 1053   BUN 20.0 07/03/2013 1346   BUN 40* 06/12/2012 2124   BUN 20 12/02/2011 1053   CREATININE 0.8 07/03/2013 1346   CREATININE 1.15* 06/12/2012 2124   CREATININE 0.7 12/02/2011 1053      Component Value Date/Time   CALCIUM 9.2 07/03/2013 1346   CALCIUM 9.3 06/12/2012 2124   CALCIUM 9.1 12/02/2011 1053   ALKPHOS 81 07/03/2013 1346   ALKPHOS 73 12/02/2011 1053   ALKPHOS 79 10/21/2011 0630   AST 19 07/03/2013 1346   AST 30 12/02/2011 1053   AST 22 10/21/2011 0630   ALT 14 07/03/2013 1346   ALT 22 12/02/2011 1053   ALT 17 10/21/2011 0630   BILITOT 0.42 07/03/2013 1346   BILITOT 0.80 12/02/2011 1053   BILITOT 0.3 10/21/2011 0630       RADIOGRAPHIC STUDIES: Dg Chest 2 View  07/12/2013   *RADIOLOGY  REPORT*  Clinical Data: History of carcinoma.  History of COPD.  CHEST - 2 VIEW  Comparison: Prior chest CTs and chest radiographs dating 05/15/2013 and 11/07/2012  Findings: Stable cardiac and mediastinal contours with tortuosity and calcification of the thoracic aorta.  Several bilateral nodular densities are redemonstrated and grossly stable compared to prior examinations.  Unchanged left lung base  scarring and/or atelectasis.  No large consolidative pulmonary opacity.  No pleural effusion pneumothorax.  Mid thoracic spine degenerative change.  IMPRESSION: No significant interval change in subtle bilateral nodular densities.  No acute cardiopulmonary process.   Original Report Authenticated By: Annia Belt, M.D    ASSESSMENT AND PLAN: This is a very pleasant 79 years old [place] female with history of carcinoid tumor of the lung status post resection and has been observation since December of 2010 with no significant evidence for disease progression. I discussed the chest x-ray lab result with the patient and her daughter today. I recommended for her to continue on observation with repeat CBC, comprehensive metabolic panel and chest x-ray in one year. She was advised to call immediately if she has any concerning symptoms in the interval.   The patient voices understanding of current disease status and treatment options and is in agreement with the current care plan.  All questions were answered. The patient knows to call the clinic with any problems, questions or concerns. We can certainly see the patient much sooner if necessary.

## 2013-08-07 ENCOUNTER — Telehealth: Payer: Self-pay | Admitting: Internal Medicine

## 2013-08-07 NOTE — Telephone Encounter (Signed)
LVM FOR PT REGARDING TO 10.12 AND 10.14 APPTS.Marland KitchenMAILED PT AVS AND LETTER

## 2013-08-16 ENCOUNTER — Other Ambulatory Visit: Payer: Self-pay | Admitting: Internal Medicine

## 2013-08-16 DIAGNOSIS — D3A00 Benign carcinoid tumor of unspecified site: Secondary | ICD-10-CM

## 2013-08-25 ENCOUNTER — Other Ambulatory Visit: Payer: Self-pay | Admitting: Cardiovascular Disease

## 2013-08-27 ENCOUNTER — Encounter (HOSPITAL_COMMUNITY): Payer: Self-pay | Admitting: Pharmacy Technician

## 2013-08-31 ENCOUNTER — Encounter (HOSPITAL_COMMUNITY): Payer: Self-pay | Admitting: *Deleted

## 2013-09-05 NOTE — Progress Notes (Signed)
09-05-13 1450 Daughter -Buyer, [employer] of ([employer]) came to PAT area, desiring which meds patient should take AM of and which not to take. Instructed to take these AM meds day of procedure: Benicar, Omeprazole, Indapamide, Nifedipine. Use Inhalers as usual and bring. Do not take any Diabetic meds or insulin AM of procedure. This was written out and daughter given copy. Reminded , should be nothing by mouth after midnight. W. Kennon Portela

## 2013-09-07 ENCOUNTER — Other Ambulatory Visit: Payer: Self-pay | Admitting: Gastroenterology

## 2013-09-07 NOTE — Addendum Note (Signed)
Addended byVida Rigger on: 09/07/2013 01:46 PM   Modules accepted: Orders

## 2013-09-10 ENCOUNTER — Telehealth: Payer: Self-pay | Admitting: Cardiovascular Disease

## 2013-09-10 NOTE — Telephone Encounter (Signed)
New problem    benicar

## 2013-09-11 ENCOUNTER — Telehealth: Payer: Self-pay | Admitting: *Deleted

## 2013-09-11 ENCOUNTER — Other Ambulatory Visit: Payer: Self-pay | Admitting: *Deleted

## 2013-09-11 MED ORDER — OLMESARTAN MEDOXOMIL 40 MG PO TABS: 40.0000 mg | ORAL_TABLET | Freq: Two times a day (BID) | ORAL | Status: DC

## 2013-09-11 NOTE — Telephone Encounter (Signed)
Patient called about benicar. She stated that Francine Graven is only going to pay for one pill starting in January. [phone] is the number that she provided me for [employer]. Will route to Addison Lank, RN, patient care advocate for review.

## 2013-09-13 ENCOUNTER — Encounter (HOSPITAL_COMMUNITY): Payer: Self-pay

## 2013-09-13 ENCOUNTER — Ambulatory Visit (HOSPITAL_COMMUNITY)
Admission: RE | Admit: 2013-09-13 | Discharge: 2013-09-13 | Disposition: A | Discharge status: Home / Self Care | Payer: Medicare Other | Source: Ambulatory Visit | Attending: Gastroenterology | Admitting: Gastroenterology

## 2013-09-13 ENCOUNTER — Encounter (HOSPITAL_COMMUNITY)
Admission: RE | Disposition: A | Discharge status: Home / Self Care | Payer: Self-pay | Source: Ambulatory Visit | Attending: Gastroenterology

## 2013-09-13 ENCOUNTER — Encounter (HOSPITAL_COMMUNITY): Payer: Medicare Other | Admitting: Anesthesiology

## 2013-09-13 ENCOUNTER — Ambulatory Visit (HOSPITAL_COMMUNITY): Payer: Medicare Other | Admitting: Anesthesiology

## 2013-09-13 DIAGNOSIS — I739 Peripheral vascular disease, unspecified: Secondary | ICD-10-CM | POA: Insufficient documentation

## 2013-09-13 DIAGNOSIS — I2510 Atherosclerotic heart disease of native coronary artery without angina pectoris: Secondary | ICD-10-CM | POA: Insufficient documentation

## 2013-09-13 DIAGNOSIS — Z860100 Personal history of colon polyps, unspecified: Secondary | ICD-10-CM | POA: Insufficient documentation

## 2013-09-13 DIAGNOSIS — K219 Gastro-esophageal reflux disease without esophagitis: Secondary | ICD-10-CM | POA: Insufficient documentation

## 2013-09-13 DIAGNOSIS — E119 Type 2 diabetes mellitus without complications: Secondary | ICD-10-CM | POA: Insufficient documentation

## 2013-09-13 DIAGNOSIS — D126 Benign neoplasm of colon, unspecified: Secondary | ICD-10-CM | POA: Insufficient documentation

## 2013-09-13 DIAGNOSIS — K648 Other hemorrhoids: Secondary | ICD-10-CM | POA: Insufficient documentation

## 2013-09-13 DIAGNOSIS — I10 Essential (primary) hypertension: Secondary | ICD-10-CM | POA: Insufficient documentation

## 2013-09-13 DIAGNOSIS — I719 Aortic aneurysm of unspecified site, without rupture: Secondary | ICD-10-CM | POA: Insufficient documentation

## 2013-09-13 DIAGNOSIS — J4489 Other specified chronic obstructive pulmonary disease: Secondary | ICD-10-CM | POA: Insufficient documentation

## 2013-09-13 DIAGNOSIS — J449 Chronic obstructive pulmonary disease, unspecified: Secondary | ICD-10-CM | POA: Insufficient documentation

## 2013-09-13 DIAGNOSIS — K644 Residual hemorrhoidal skin tags: Secondary | ICD-10-CM | POA: Insufficient documentation

## 2013-09-13 HISTORY — PX: COLONOSCOPY: SHX5424

## 2013-09-13 SURGERY — COLONOSCOPY
Anesthesia: Monitor Anesthesia Care

## 2013-09-13 MED ORDER — PROPOFOL INFUSION 10 MG/ML OPTIME
INTRAVENOUS | Status: DC | PRN
  Administered 2013-09-13: 300 ug/kg/min via INTRAVENOUS

## 2013-09-13 MED ORDER — SODIUM CHLORIDE 0.9 % IV SOLN: INTRAVENOUS | Status: DC

## 2013-09-13 MED ORDER — PROPOFOL 10 MG/ML IV BOLUS
INTRAVENOUS | Status: AC
  Filled 2013-09-13: qty 20

## 2013-09-13 MED ORDER — PROPOFOL 10 MG/ML IV BOLUS
INTRAVENOUS | Status: DC | PRN
  Administered 2013-09-13: 30 mg via INTRAVENOUS

## 2013-09-13 MED ORDER — LACTATED RINGERS IV SOLN
INTRAVENOUS | Status: DC
  Administered 2013-09-13: 1000 mL via INTRAVENOUS

## 2013-09-13 NOTE — Telephone Encounter (Signed)
Prior authorization for Benicar 40 mg bid faxed to [facility].

## 2013-09-13 NOTE — Preoperative (Signed)
Beta Blockers   Reason not to administer Beta Blockers:Not Applicable 

## 2013-09-13 NOTE — Transfer of Care (Signed)
Immediate Anesthesia Transfer of Care Note  Patient: [name]  Procedure(s) Performed: Procedure(s): COLONOSCOPY (N/A)  Patient Location: PACU and Endoscopy Unit  Anesthesia Type:MAC  Level of Consciousness: sedated and patient cooperative  Airway & Oxygen Therapy: Patient Spontanous Breathing and Patient connected to nasal cannula oxygen  Post-op Assessment: Report given to PACU RN and Post -op Vital signs reviewed and stable  Post vital signs: Reviewed and stable  Complications: No apparent anesthesia complications

## 2013-09-13 NOTE — Anesthesia Preprocedure Evaluation (Addendum)
Anesthesia Evaluation  Patient identified by MRN, date of birth, ID band Patient awake    Reviewed: Allergy & Precautions, H&P , NPO status , Patient's Chart, lab work & pertinent test results  Airway Mallampati: II TM Distance: >3 FB Neck ROM: full    Dental  (+) Edentulous Upper and Edentulous Lower   Pulmonary COPD COPD inhaler,  breath sounds clear to auscultation  Pulmonary exam normal       Cardiovascular hypertension, Pt. on medications + CAD and + Peripheral Vascular Disease + Valvular Problems/Murmurs AI Rhythm:regular Rate:Normal + Diastolic murmurs Aortic aneurysm   Neuro/Psych negative neurological ROS  negative psych ROS   GI/Hepatic negative GI ROS, Neg liver ROS, GERD-  Medicated and Controlled,  Endo/Other  diabetes, Well Controlled, Type 2, Oral Hypoglycemic Agents  Renal/GU negative Renal ROS  negative genitourinary   Musculoskeletal   Abdominal   Peds  Hematology negative hematology ROS (+)   Anesthesia Other Findings Carcinoid tumor  Reproductive/Obstetrics negative OB ROS                          Anesthesia Physical Anesthesia Plan  ASA: III  Anesthesia Plan: MAC   Post-op Pain Management:    Induction:   Airway Management Planned: Simple Face Mask  Additional Equipment:   Intra-op Plan:   Post-operative Plan:   Informed Consent: I have reviewed the patients History and Physical, chart, labs and discussed the procedure including the risks, benefits and alternatives for the proposed anesthesia with the patient or authorized representative who has indicated his/her understanding and acceptance.   Dental Advisory Given  Plan Discussed with: CRNA and Surgeon  Anesthesia Plan Comments:         Anesthesia Quick Evaluation

## 2013-09-13 NOTE — Progress Notes (Signed)
[name] 11:18 AM  Subjective: Patient without any new complaints and no problem with prep and no obvious new medical problems since I saw her in the office  Objective: Vital signs stable afebrile no acute distress physical exam please see pre-assessment evaluation  Assessment: History of colon polyps  Plan: Okay to proceed with colonoscopy with anesthesia as help and we discussed her previous problems with propofol last time  [facility]

## 2013-09-13 NOTE — Op Note (Signed)
[facility] [address] [place], [address]   COLONOSCOPY PROCEDURE REPORT  PATIENT: [name], [name]  MR#: [id number] BIRTHDATE: January 29, 1932 , 81  yrs. old GENDER: Female ENDOSCOPIST: Vida Rigger, MD REFERRED BY: PROCEDURE DATE:  09/13/2013 PROCEDURE:   Colonoscopy with snare polypectomy ASA CLASS:   Class II INDICATIONS:Patient's personal history of adenomatous colon polyps.  MEDICATIONS: propofol (Diprivan) 350mg  IV  DESCRIPTION OF PROCEDURE:   After the risks benefits and alternatives of the procedure were thoroughly explained, informed consent was obtained.  The Pentax Ped Colon [id number]  endoscope was introduced through the anus and advanced to the terminal ileum which was intubated for a short distance , limited by No adverse events experienced.   The quality of the prep was adequate. .  The instrument was then slowly withdrawn as the colon was fully examined.the findings are recorded below and the patient tolerated the procedure well there was no obvious immediate complication and this exam did not require any abdominal pressure or any position changes and the scope was inserted at the end of the procedure after anal rectal pull-through and retroflexion a short ways up the left side of the colon and air was suctioned and the scope was removed          FINDINGS:  1. Small internal/external hemorrhoids 2. Small proximal sigmoid polyp status post hot snare and remove 3 otherwise within normal limits to the terminal ileum  COMPLICATIONS: none  IMPRESSION:  above  RECOMMENDATIONS: await pathology advance diet doubt I would rescreen any further and GI followup when necessary   _______________________________ eSigned:  Vida Rigger, MD 09/13/2013 12:07 PM   CC:  PATIENT NAME:  [name], [name] MR#: [id number]

## 2013-09-13 NOTE — Anesthesia Postprocedure Evaluation (Signed)
  Anesthesia Post-op Note  Patient: [name]  Procedure(s) Performed: Procedure(s) (LRB): COLONOSCOPY (N/A)  Patient Location: PACU  Anesthesia Type: MAC  Level of Consciousness: awake and alert   Airway and Oxygen Therapy: Patient Spontanous Breathing  Post-op Pain: mild  Post-op Assessment: Post-op Vital signs reviewed, Patient's Cardiovascular Status Stable, Respiratory Function Stable, Patent Airway and No signs of Nausea or vomiting  Last Vitals:  Filed Vitals:   09/13/13 1220  BP: 172/79  Pulse:   Temp:   Resp: 30    Post-op Vital Signs: stable   Complications: No apparent anesthesia complications

## 2013-09-14 ENCOUNTER — Encounter (HOSPITAL_COMMUNITY): Payer: Self-pay | Admitting: Gastroenterology

## 2013-09-17 ENCOUNTER — Telehealth: Payer: Self-pay | Admitting: *Deleted

## 2013-09-17 MED ORDER — OLMESARTAN MEDOXOMIL 40 MG PO TABS: 40.0000 mg | ORAL_TABLET | Freq: Every day | ORAL | Status: DC

## 2013-09-17 MED ORDER — OLMESARTAN MEDOXOMIL 40 MG PO TABS: 40.0000 mg | ORAL_TABLET | Freq: Two times a day (BID) | ORAL | Status: DC

## 2013-09-17 NOTE — Telephone Encounter (Signed)
Spoke with daughter regarding denial for benicar bid from [facility] for 2015, see Dr Eden Emms note regarding change dose to daily, have informed daughter of this, we will follow patient in 2015 for her hypertension.

## 2013-09-17 NOTE — Telephone Encounter (Signed)
Message copied by Carmela Hurt on Mon Sep 17, 2013  4:16 PM ------      Message from: Wendall Stade      Created: Mon Sep 17, 2013  9:16 AM      Regarding: RE: deniel from [place] on benicar 40 mg bid       They should cover 40mg  daily I think it is the bid that is the issue      ----- Message -----         From: Carmela Hurt, RN         Sent: 09/17/2013   8:45 AM           To: Wendall Stade, MD, Carmela Hurt, RN      Subject: deniel from [place] [facility] on benicar 40 mg bid                  Dr Eden Emms, I can appeal if you have more information than what I submitted below..............      Thanks, Addison Lank, RN Patient Care Advocate                              [employer] DENIED PA of [facility] due to maximum dispensing limit, the information submitted doesn't meet medical necessary, I had submitted the ineffectiveness if benicar 20 mg, lopressor, procardia XL, patient presently on lozol 2.5 mg daily      Lopressor dates 10/2011-01/2012      Procardia 05/2011-01/2012      Will send to Dr Eden Emms for review and any other information I may submit for an appeal       ------

## 2013-09-17 NOTE — Telephone Encounter (Signed)
[employer] DENIED PA of BENICAR due to maximum dispensing limit, the information submitted doesn't meet medical necessary, I had submitted the ineffectiveness if benicar 20 mg, lopressor, procardia XL, patient presently on lozol 2.5 mg daily Lopressor dates 10/2011-01/2012 Procardia 05/2011-01/2012 Will send to Dr Eden Emms for review and any other information I may submit for an appeal

## 2013-10-26 ENCOUNTER — Other Ambulatory Visit: Payer: Self-pay | Admitting: *Deleted

## 2013-10-26 DIAGNOSIS — I712 Thoracic aortic aneurysm, without rupture, unspecified: Secondary | ICD-10-CM

## 2013-10-26 DIAGNOSIS — R911 Solitary pulmonary nodule: Secondary | ICD-10-CM

## 2013-11-09 LAB — CREATININE, SERUM: Creat: 0.74 mg/dL (ref 0.50–1.10)

## 2013-11-09 LAB — BUN: BUN: 34 mg/dL — ABNORMAL HIGH (ref 6–23)

## 2013-11-13 ENCOUNTER — Ambulatory Visit
Admission: RE | Admit: 2013-11-13 | Discharge: 2013-11-13 | Disposition: A | Discharge status: Home / Self Care | Payer: Medicare Other | Source: Ambulatory Visit | Attending: Thoracic Surgery (Cardiothoracic Vascular Surgery) | Admitting: Thoracic Surgery (Cardiothoracic Vascular Surgery)

## 2013-11-13 ENCOUNTER — Encounter: Payer: Medicare Other | Admitting: Thoracic Surgery (Cardiothoracic Vascular Surgery)

## 2013-11-13 ENCOUNTER — Encounter: Payer: Self-pay | Admitting: Thoracic Surgery (Cardiothoracic Vascular Surgery)

## 2013-11-13 ENCOUNTER — Ambulatory Visit (INDEPENDENT_AMBULATORY_CARE_PROVIDER_SITE_OTHER): Payer: Medicare Other | Admitting: Thoracic Surgery (Cardiothoracic Vascular Surgery)

## 2013-11-13 VITALS — BP 133/64 | HR 76 | Resp 20 | Ht 60.0 in | Wt 181.0 lb

## 2013-11-13 DIAGNOSIS — I712 Thoracic aortic aneurysm, without rupture, unspecified: Secondary | ICD-10-CM

## 2013-11-13 DIAGNOSIS — R911 Solitary pulmonary nodule: Secondary | ICD-10-CM

## 2013-11-13 DIAGNOSIS — Z8639 Personal history of other endocrine, nutritional and metabolic disease: Secondary | ICD-10-CM

## 2013-11-13 DIAGNOSIS — Z862 Personal history of diseases of the blood and blood-forming organs and certain disorders involving the immune mechanism: Secondary | ICD-10-CM

## 2013-11-13 DIAGNOSIS — R918 Other nonspecific abnormal finding of lung field: Secondary | ICD-10-CM

## 2013-11-13 MED ORDER — IOHEXOL 350 MG/ML SOLN
80.0000 mL | Freq: Once | INTRAVENOUS | Status: AC | PRN
  Administered 2013-11-13: 80 mL via INTRAVENOUS

## 2013-11-13 NOTE — Progress Notes (Signed)
HPI:  [name] returns today for a scheduled 6 month follow up visit regarding her ascending aortic aneurysm and her multiple carcinoid tumors. Her daughter accompanies her to the visit today and is her interpreter.  She has been doing well since her last visit. Her activities are limited. She's not having any chest pain. She does get some shortness of breath with exertion, but that has been stable.  Past Medical History  Diagnosis Date  . Diabetes mellitus   . Hypertension   . Coronary artery disease   . Aortic insufficiency   . Hyperlipidemia   . Aortic aneurysm   . Hypercholesterolemia   . COPD (chronic obstructive pulmonary disease)   . Bronchitis   . Edema   . GERD (gastroesophageal reflux disease)   . Aortic valve disease     stable  . Pain in joint, forearm   . Pain in joint, hand   . Pain in joint, lower leg   . Difficulty in walking(719.7)   . Disturbance of skin sensation   . Cancer       Current Outpatient Prescriptions  Medication Sig Dispense Refill  . acetaminophen (TYLENOL) 325 MG tablet Take 325 mg by mouth every 6 (six) hours as needed for pain. For pain      . ADVAIR HFA 230-21 MCG/ACT inhaler Inhale 2 puffs into the lungs 2 (two) times daily.       Marland Kitchen albuterol (PROVENTIL HFA) 108 (90 BASE) MCG/ACT inhaler Inhale 2 puffs into the lungs every 6 (six) hours as needed for wheezing or shortness of breath. For wheezing      . aspirin 81 MG tablet Take 81 mg by mouth daily.       Marland Kitchen FeFum-FePoly-FA-B Cmp-C-Biot (INTEGRA PLUS) CAPS TAKE 1 CAPSULE BY MOUTH EVERY DAY      . furosemide (LASIX) 20 MG tablet Take 20 mg by mouth daily as needed for fluid.      Marland Kitchen glimepiride (AMARYL) 1 MG tablet Take 1 mg by mouth daily.       . indapamide (LOZOL) 2.5 MG tablet Take 2.5 mg by mouth every morning.      . Multiple Vitamin (MULITIVITAMIN WITH MINERALS) TABS Take 1 tablet by mouth daily.      Marland Kitchen NIFEdipine (PROCARDIA XL/ADALAT-CC) 60 MG 24 hr tablet Take 60 mg by mouth  daily.      . nitroGLYCERIN (NITROSTAT) 0.4 MG SL tablet Place 0.4 mg under the tongue every 5 (five) minutes as needed for chest pain.      Marland Kitchen olmesartan (BENICAR) 40 MG tablet Take 1 tablet (40 mg total) by mouth 2 (two) times daily.  30 tablet  1  . omeprazole (PRILOSEC) 20 MG capsule Take 20 mg by mouth daily.      . pioglitazone (ACTOS) 15 MG tablet Take 15 mg by mouth daily.      . potassium chloride (K-DUR,KLOR-CON) 10 MEQ tablet Take 20 mEq by mouth daily.      . rosuvastatin (CRESTOR) 20 MG tablet Take 10 mg by mouth at bedtime as needed and may repeat dose one time if needed.        No current facility-administered medications for this visit.    Physical Exam BP 133/64  Pulse 76  Resp 20  Ht 5' (1.524 m)  Wt 181 lb (82.101 kg)  BMI 35.35 kg/m2  SpO53 59% 80 year old woman in no acute distress Obese Alert and oriented with no focal motor deficits Cardiac regular rate and rhythm,  2/6 systolic and diastolic murmur heard best at the right upper sternal border Lungs diminished breath sounds bilaterally No cervical or subclavicular adenopathy No carotid bruits  Diagnostic Tests: CT chest 11/13/2013 CT ANGIOGRAPHY CHEST WITH CONTRAST  TECHNIQUE:  Multidetector CT imaging of the chest was performed using the  standard protocol during bolus administration of intravenous  contrast. Multiplanar CT image reconstructions including MIPs were  obtained to evaluate the vascular anatomy.  CONTRAST: 26mL OMNIPAQUE IOHEXOL 350 MG/ML SOLN  COMPARISON: 05/15/2013 and 09/28/2011  FINDINGS:  Lungs are adequately inflated again demonstrate numerous bilateral  pulmonary nodules with the largest over the right base measuring 1.5  cm as these are essentially unchanged in size and number compared to  the prior exams. There is no focal consolidation or effusion. The  there is minimal scarring over the lingula. There is moderate stable  cardiomegaly. There is continued evidence of  atherosclerotic  coronary artery disease. There is no change in patient's known at  ascending thoracic aortic aneurysm measuring approximately 5.4 cm in  AP diameter. There is no significant hilar, mediastinal or axillary  adenopathy.  Images through the upper abdomen demonstrate subtle nodular density  over the posterior lateral right diaphragmatic region unchanged.  Images through the upper abdomen are otherwise unremarkable. There  are degenerative changes of the spine.  Review of the MIP images confirms the above findings.  IMPRESSION:  Stable ascending thoracic aortic aneurysm measuring 5.4 cm in AP  diameter.  Numerous bilateral pulmonary nodules unchanged in size and number.  While stable for greater than 2 years, would continue surveillance  as clinically indicated in this patient with a known history of lung  cancer.  Stable cardiomegaly and evidence of atherosclerotic coronary artery  disease.  Stable lingular scarring.  Stable nodular soft tissue density along the most posterior lateral  aspect of the right diaphragmatic surface.  Electronically Signed  By: Marin Olp M.D.  On: 11/13/2013 14:47   Impression: 80 year old woman with multiple medical problems including an ascending aortic aneurysm and multiple carcinoid syndrome of the lungs.  Her lung nodules have remained stable over the past 6 months.  Her aneurysm also is unchanged in size of the past 6 months. This is measured at a different size each time it is read, but when closing compared side-by-side but doesn't appear to be any significant increase in size. I had a long discussion with the patient through her daughter as well as with her daughter regarding management of her aneurysm. In my opinion she is a poor operative candidate. I suspect she would be better off in the long run if just managed medically. She does have some risk for a dissection or rupture, which could happen at any time. But she would be an  extremely high-risk surgical candidate and I think even if she survives with likely a poor quality-of-life. The patient and her daughter understand my reasoning and agree with the plan to continue to follow.  Plan:  Return in 6 months with CT of chest to reassess aneurysm and lung nodules.

## 2013-12-25 ENCOUNTER — Other Ambulatory Visit: Payer: Self-pay | Admitting: Cardiovascular Disease

## 2013-12-28 ENCOUNTER — Other Ambulatory Visit: Payer: Self-pay | Admitting: Cardiovascular Disease

## 2014-01-08 ENCOUNTER — Other Ambulatory Visit: Payer: Self-pay

## 2014-01-08 DIAGNOSIS — Z1231 Encounter for screening mammogram for malignant neoplasm of breast: Secondary | ICD-10-CM

## 2014-02-05 ENCOUNTER — Ambulatory Visit
Admission: RE | Admit: 2014-02-05 | Discharge: 2014-02-05 | Disposition: A | Discharge status: Home / Self Care | Payer: Medicare Other | Source: Ambulatory Visit

## 2014-02-05 DIAGNOSIS — Z1231 Encounter for screening mammogram for malignant neoplasm of breast: Secondary | ICD-10-CM

## 2014-02-08 ENCOUNTER — Ambulatory Visit: Payer: Medicare Other | Admitting: Physical Medicine & Rehabilitation

## 2014-02-18 ENCOUNTER — Encounter: Payer: Self-pay | Admitting: Obstetrics & Gynecology

## 2014-02-18 ENCOUNTER — Ambulatory Visit (INDEPENDENT_AMBULATORY_CARE_PROVIDER_SITE_OTHER): Payer: Medicare Other | Admitting: Obstetrics & Gynecology

## 2014-02-18 VITALS — BP 168/83 | HR 84 | Temp 97.6°F | Ht 60.0 in | Wt 181.0 lb

## 2014-02-18 DIAGNOSIS — Z01419 Encounter for gynecological examination (general) (routine) without abnormal findings: Secondary | ICD-10-CM

## 2014-02-18 DIAGNOSIS — Z124 Encounter for screening for malignant neoplasm of cervix: Secondary | ICD-10-CM

## 2014-02-18 NOTE — Progress Notes (Signed)
Subjective:     [name] is a 80 y.o. female here for a routine exam.  Current complaints: none.     Gynecologic History No LMP recorded. Patient has had a hysterectomy.  Last mammogram: 4/15. Results were: normal  Obstetric History OB History  Gravida Para Term Preterm AB SAB TAB Ectopic Multiple Living  2 1 1  1 1    1     # Outcome Date GA Lbr Len/2nd Weight Sex Delivery Anes PTL Lv  2 SAB 1965          1 TRM 08/07/57 [redacted]w[redacted]d  3.175 kg (7 lb) F SVD None  Y      Past Medical History  Diagnosis Date  . Diabetes mellitus   . Hypertension   . Coronary artery disease   . Aortic insufficiency   . Hyperlipidemia   . Aortic aneurysm   . Hypercholesterolemia   . COPD (chronic obstructive pulmonary disease)   . Bronchitis   . Edema   . GERD (gastroesophageal reflux disease)   . Aortic valve disease     stable  . Pain in joint, forearm   . Pain in joint, hand   . Pain in joint, lower leg   . Difficulty in walking(719.7)   . Disturbance of skin sensation   . Cancer     Past Surgical History  Procedure Laterality Date  . Pars plana vitrectomy      right eye  . Posterior capsulectomy      right eye  . Pars plana vitrectomy w/ endophotocoagulation      right eye  . Fiberoptic bronch with endobronchial u/s  07/02/2010  . Video bronchoscopy  12/09/2009  . 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 Bronch with endobronchial biopies  09/15/2009  . Colonoscopy N/A 09/13/2013    Procedure: COLONOSCOPY;  Surgeon: Jeryl Columbia, MD;  Location: [facility];  Service: Endoscopy;  Laterality: N/A;    Current outpatient prescriptions:acetaminophen (TYLENOL) 325 MG tablet, Take 325 mg by mouth every 6 (six) hours as needed for pain. For pain, Disp: , Rfl: ;  ADVAIR HFA 230-21 MCG/ACT inhaler, Inhale 2 puffs into the lungs 2 (two) times daily. , Disp: , Rfl: ;  aspirin 81 MG tablet, Take 81 mg by mouth daily. , Disp: , Rfl: ;   FeFum-FePoly-FA-B Cmp-C-Biot (INTEGRA PLUS) CAPS, TAKE 1 CAPSULE BY MOUTH EVERY DAY, Disp: , Rfl:  furosemide (LASIX) 20 MG tablet, Take 20 mg by mouth daily as needed for fluid., Disp: , Rfl: ;  glimepiride (AMARYL) 1 MG tablet, Take 1 mg by mouth daily. , Disp: , Rfl: ;  indapamide (LOZOL) 2.5 MG tablet, Take 2.5 mg by mouth every morning., Disp: , Rfl: ;  Multiple Vitamin (MULITIVITAMIN WITH MINERALS) TABS, Take 1 tablet by mouth daily., Disp: , Rfl:  NIFEdipine (PROCARDIA XL/ADALAT-CC) 60 MG 24 hr tablet, TAKE 1 TABLET BY MOUTH EVERY DAY, Disp: 30 tablet, Rfl: 3;  olmesartan (BENICAR) 40 MG tablet, Take 1 tablet (40 mg total) by mouth 2 (two) times daily., Disp: 30 tablet, Rfl: 1;  omeprazole (PRILOSEC) 20 MG capsule, Take 20 mg by mouth daily., Disp: , Rfl: ;  pioglitazone (ACTOS) 15 MG tablet, Take 15 mg by mouth daily., Disp: , Rfl:  potassium chloride (K-DUR,KLOR-CON) 10 MEQ tablet, Take 20 mEq by mouth daily., Disp: , Rfl: ;  rosuvastatin (CRESTOR) 20 MG tablet, Take 10 mg by mouth at bedtime as needed and may  repeat dose one time if needed. , Disp: , Rfl: ;  nitroGLYCERIN (NITROSTAT) 0.4 MG SL tablet, Place 0.4 mg under the tongue every 5 (five) minutes as needed for chest pain., Disp: , Rfl:  No Known Allergies  History  Substance Use Topics  . Smoking status: Never Smoker   . Smokeless tobacco: Never Used  . Alcohol Use: No    Family History  Problem Relation Age of Onset  . Hypertension Mother   . Hypertension Father       Review of Systems  Constitutional: negative for fatigue and weight loss Respiratory: negative for cough and wheezing Cardiovascular: negative for chest pain, fatigue and palpitations Gastrointestinal: negative for abdominal pain and change in bowel habits Musculoskeletal:c/o backache Neurological: negative for gait problems and tremors Behavioral/Psych: negative for abusive relationship, depression Endocrine: negative for temperature intolerance    Genitourinary:negative for abnormal menstrual periods, genital lesions, hot flashes, sexual problems and vaginal discharge Integument/breast: negative for breast lump, breast tenderness, nipple discharge and skin lesion(s)    Objective:       General:   alert  Skin:   no rash, multiple raised, scaly, hyperpigmented lesions  Lungs:   clear to auscultation bilaterally  Heart:   regular rate and rhythm, S1, S2 normal, no murmur, click, rub or gallop  Breasts:   normal without suspicious masses, skin or nipple changes or axillary nodes  Abdomen:  normal findings: no organomegaly, soft, non-tender and no hernia  Pelvis:  External genitalia: normal general appearance Urinary system: urethral meatus normal and bladder without fullness, nontender Vaginal: normal without tenderness, induration or masses Cervix: absent Adnexa: normal bimanual exam Uterus: absent   Lab Review Urine pregnancy test Labs reviewed no Radiologic studies reviewed yes    Assessment:    Healthy female exam.    Plan:    Education reviewed: calcium supplements and weight bearing exercise.  Follow up as needed.

## 2014-02-18 NOTE — Patient Instructions (Signed)

## 2014-02-19 ENCOUNTER — Other Ambulatory Visit: Payer: Self-pay

## 2014-02-19 MED ORDER — INDAPAMIDE 2.5 MG PO TABS: 2.5000 mg | ORAL_TABLET | ORAL | Status: DC

## 2014-03-04 ENCOUNTER — Encounter: Payer: Medicare Other | Attending: Physical Medicine & Rehabilitation

## 2014-03-04 ENCOUNTER — Encounter: Payer: Self-pay | Admitting: Physical Medicine & Rehabilitation

## 2014-03-04 ENCOUNTER — Ambulatory Visit (HOSPITAL_BASED_OUTPATIENT_CLINIC_OR_DEPARTMENT_OTHER): Payer: Medicare Other | Admitting: Physical Medicine & Rehabilitation

## 2014-03-04 VITALS — BP 168/83 | HR 83 | Resp 14 | Ht 60.0 in | Wt 180.8 lb

## 2014-03-04 DIAGNOSIS — E119 Type 2 diabetes mellitus without complications: Secondary | ICD-10-CM | POA: Insufficient documentation

## 2014-03-04 DIAGNOSIS — I2510 Atherosclerotic heart disease of native coronary artery without angina pectoris: Secondary | ICD-10-CM | POA: Insufficient documentation

## 2014-03-04 DIAGNOSIS — M545 Low back pain, unspecified: Secondary | ICD-10-CM

## 2014-03-04 DIAGNOSIS — G8929 Other chronic pain: Secondary | ICD-10-CM

## 2014-03-04 DIAGNOSIS — E7800 Pure hypercholesterolemia, unspecified: Secondary | ICD-10-CM | POA: Insufficient documentation

## 2014-03-04 DIAGNOSIS — M47817 Spondylosis without myelopathy or radiculopathy, lumbosacral region: Secondary | ICD-10-CM

## 2014-03-04 DIAGNOSIS — J4489 Other specified chronic obstructive pulmonary disease: Secondary | ICD-10-CM | POA: Insufficient documentation

## 2014-03-04 DIAGNOSIS — I10 Essential (primary) hypertension: Secondary | ICD-10-CM | POA: Insufficient documentation

## 2014-03-04 DIAGNOSIS — J449 Chronic obstructive pulmonary disease, unspecified: Secondary | ICD-10-CM | POA: Insufficient documentation

## 2014-03-04 MED ORDER — TRAMADOL HCL 50 MG PO TABS: 50.0000 mg | ORAL_TABLET | Freq: Two times a day (BID) | ORAL | Status: DC

## 2014-03-04 NOTE — Progress Notes (Signed)
Subjective:    Patient ID: [name], female    DOB: 03-26-1932, 80 y.o.   MRN: [id number]  HPI  Chief complaint is back pain however she complains of pain all over her body. Her daughter indicates that the patient is quite sedentary. She has gained weight over time. Her husband is also debilitating secondary to COPD with recent hospitalization for pneumonia. Daughter does help out with housework as well as getting to doctors appointments. Patient has no numbness or tingling. No weakness in the legs. No bowel or bladder issues no weight loss Patient speaks many [place] daughter helps interpret although her English is limited as well  Past medical history significant for his carcinoma tumor under surveillance from oncology. No evidence of progression as per last note October 2014  Reviewed x-rays of the abdominal spine which show severe facet arthropathy right L5-S1 Pain Inventory Average Pain 5 Pain Right Now 5 My pain is other  In the last 24 hours, has pain interfered with the following? General activity 7 Relation with others 7 Enjoyment of life 7 What TIME of day is your pain at its worst? varies Sleep (in general) Fair  Pain is worse with: walking, bending, sitting and some activites Pain improves with: medication, TENS and injections Relief from Meds: 5  Mobility how many minutes can you walk? 15 ability to climb steps?  no do you drive?  no  Function not employed: date last employed . I need assistance with the following:  meal prep, household duties and shopping  Neuro/Psych weakness spasms  Prior Studies Any changes since last visit?  no  Physicians involved in your care Any changes since last visit?  no   Family History  Problem Relation Age of Onset  . Hypertension Mother   . Hypertension Father    History   Social History  . Marital Status: Married    Spouse Name: N/A    Number of Children: N/A  . Years of Education: N/A   Social  History Main Topics  . Smoking status: Never Smoker   . Smokeless tobacco: Never Used  . Alcohol Use: No  . Drug Use: No  . Sexual Activity: Not Currently   Other Topics Concern  . None   Social History Narrative  . None   Past Surgical History  Procedure Laterality Date  . Pars plana vitrectomy      right eye  . Posterior capsulectomy      right eye  . Pars plana vitrectomy w/ endophotocoagulation      right eye  . Fiberoptic bronch with endobronchial u/s  07/02/2010  . Video bronchoscopy  12/09/2009  . 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 Bronch with endobronchial biopies  09/15/2009  . Colonoscopy N/A 09/13/2013    Procedure: COLONOSCOPY;  Surgeon: Jeryl Columbia, MD;  Location: [facility];  Service: Endoscopy;  Laterality: N/A;   Past Medical History  Diagnosis Date  . Diabetes mellitus   . Hypertension   . Coronary artery disease   . Aortic insufficiency   . Hyperlipidemia   . Aortic aneurysm   . Hypercholesterolemia   . COPD (chronic obstructive pulmonary disease)   . Bronchitis   . Edema   . GERD (gastroesophageal reflux disease)   . Aortic valve disease     stable  . Pain in joint, forearm   . Pain in joint, hand   . Pain in joint, lower leg   .  Difficulty in walking(719.7)   . Disturbance of skin sensation   . Cancer    BP 168/83  Pulse 83  Resp 14  Ht 5' (1.524 m)  Wt 180 lb 12.8 oz (82.01 kg)  BMI 35.31 kg/m2  SpO2 92%  Opioid Risk Score:   Fall Risk Score: Moderate Fall Risk (6-13 points) (educated and handout given for fall prevention in the home)  Review of Systems  Musculoskeletal: Positive for back pain.       Spasms  Neurological: Positive for weakness.  All other systems reviewed and are negative.      Objective:   Physical Exam  Nursing note and vitals reviewed. Constitutional: She is oriented to person, place, and time. She appears well-developed and well-nourished.  HENT:  Head:  Normocephalic and atraumatic.  Eyes: Conjunctivae and EOM are normal. Pupils are equal, round, and reactive to light.  Musculoskeletal:       Lumbar back: She exhibits decreased range of motion and deformity. She exhibits no tenderness, no pain and no spasm.  Mild thoracic kyphosis  Neurological: She is alert and oriented to person, place, and time. She has normal strength.  Psychiatric: She has a normal mood and affect.   Negative straight leg raising test Normal strength in both upper and lower limbs Knees have no evidence of effusion       Assessment & Plan:  1. Lumbar spondylosis particularly right L5-S1. She does not have any signs or symptoms of radiculopathy. She does have chronic low back pain. She has decreased range of motion. I think she could benefit from therapy. She like to try aquatic therapy I think this is reasonable for her will make referral. We'll start her on tramadol once again although she's not sure she wants to take this. She only gets partially from Tylenol.  Daughter would like her to try the Lidoderm patch however she realizes that Medicaid no longer pays for this.  Return to clinic 6 months

## 2014-03-04 NOTE — Patient Instructions (Signed)
[facility] [address] [phone] Aquatic therapy, please progress to land-based therapy. 2-3 times per week x3 weeks

## 2014-03-19 ENCOUNTER — Other Ambulatory Visit: Payer: Self-pay | Admitting: Internal Medicine

## 2014-04-08 ENCOUNTER — Other Ambulatory Visit: Payer: Self-pay

## 2014-04-08 DIAGNOSIS — I712 Thoracic aortic aneurysm, without rupture, unspecified: Secondary | ICD-10-CM

## 2014-04-08 DIAGNOSIS — I7101 Dissection of thoracic aorta: Secondary | ICD-10-CM

## 2014-04-08 DIAGNOSIS — I71019 Dissection of thoracic aorta, unspecified: Secondary | ICD-10-CM

## 2014-05-07 ENCOUNTER — Ambulatory Visit: Payer: Medicare Other | Admitting: Thoracic Surgery (Cardiothoracic Vascular Surgery)

## 2014-05-07 ENCOUNTER — Other Ambulatory Visit: Payer: Medicare Other

## 2014-05-21 ENCOUNTER — Ambulatory Visit: Payer: Medicare Other | Admitting: Thoracic Surgery (Cardiothoracic Vascular Surgery)

## 2014-05-28 ENCOUNTER — Other Ambulatory Visit: Payer: Medicare Other

## 2014-05-28 ENCOUNTER — Ambulatory Visit: Payer: Medicare Other | Admitting: Thoracic Surgery (Cardiothoracic Vascular Surgery)

## 2014-05-29 ENCOUNTER — Other Ambulatory Visit: Payer: Self-pay | Admitting: *Deleted

## 2014-05-29 DIAGNOSIS — I712 Thoracic aortic aneurysm, without rupture, unspecified: Secondary | ICD-10-CM

## 2014-05-29 LAB — CREATININE, SERUM: Creat: 0.6 mg/dL (ref 0.50–1.10)

## 2014-05-29 LAB — BUN: BUN: 25 mg/dL — ABNORMAL HIGH (ref 6–23)

## 2014-06-04 ENCOUNTER — Ambulatory Visit
Admission: RE | Admit: 2014-06-04 | Discharge: 2014-06-04 | Disposition: A | Discharge status: Home / Self Care | Payer: Medicare Other | Source: Ambulatory Visit | Attending: Thoracic Surgery (Cardiothoracic Vascular Surgery) | Admitting: Thoracic Surgery (Cardiothoracic Vascular Surgery)

## 2014-06-04 ENCOUNTER — Encounter: Payer: Self-pay | Admitting: Thoracic Surgery (Cardiothoracic Vascular Surgery)

## 2014-06-04 ENCOUNTER — Ambulatory Visit (INDEPENDENT_AMBULATORY_CARE_PROVIDER_SITE_OTHER): Payer: Medicare Other | Admitting: Thoracic Surgery (Cardiothoracic Vascular Surgery)

## 2014-06-04 VITALS — BP 145/86 | HR 85 | Ht 60.0 in | Wt 180.0 lb

## 2014-06-04 DIAGNOSIS — I712 Thoracic aortic aneurysm, without rupture, unspecified: Secondary | ICD-10-CM

## 2014-06-04 MED ORDER — IOHEXOL 350 MG/ML SOLN
80.0000 mL | Freq: Once | INTRAVENOUS | Status: AC | PRN
  Administered 2014-06-04: 80 mL via INTRAVENOUS

## 2014-06-04 NOTE — Progress Notes (Signed)
HPI:  [name] returns today for a scheduled 6 month followup visit.   She is an 80 year old woman with a history of multiple carcinoid syndrome. She previously was followed by Dr. Arlyce Dice.   Dr. Arlyce Dice had done an endoscopic resection of a right upper lobe tumor with laser bronchoscopy in December of 2010. He did followup video bronchoscopies in February and September of 2011, which showed some scarring at the area of previous resection but no recurrent tumor. She has multiple bilateral lung nodules which been followed with CT scans at six-month intervals since then. She has also had an ascending aneurysm thathas been followed as well.   She is accompanied by her daughter who provides interpretation.   Since her last visit [name] has been doing well. She does get short of breath with exertion. This is unchanged over time, and she is able to take care of her activities of daily living. Her activities are very limited. She denies any chest pain. She has some mild swelling in her legs when she is up on her feet for a long time.   Past Medical History  Diagnosis Date  . Diabetes mellitus   . Hypertension   . Coronary artery disease   . Aortic insufficiency   . Hyperlipidemia   . Aortic aneurysm   . Hypercholesterolemia   . COPD (chronic obstructive pulmonary disease)   . Bronchitis   . Edema   . GERD (gastroesophageal reflux disease)   . Aortic valve disease     stable  . Pain in joint, forearm   . Pain in joint, hand   . Pain in joint, lower leg   . Difficulty in walking(719.7)   . Disturbance of skin sensation   . Cancer      Current Outpatient Prescriptions  Medication Sig Dispense Refill  . ADVAIR HFA 230-21 MCG/ACT inhaler Inhale 2 puffs into the lungs 2 (two) times daily.       Marland Kitchen aspirin 81 MG tablet Take 81 mg by mouth daily.       Marland Kitchen FeFum-FePoly-FA-B Cmp-C-Biot (INTEGRA PLUS) CAPS TAKE ONE CAPSULE BY MOUTH EVERY DAY *NOT COVERED*  30 capsule  5  . furosemide  (LASIX) 20 MG tablet Take 20 mg by mouth daily as needed for fluid.      Marland Kitchen glimepiride (AMARYL) 1 MG tablet Take 1 mg by mouth daily.       . indapamide (LOZOL) 2.5 MG tablet Take 1 tablet (2.5 mg total) by mouth every morning.  30 tablet  6  . Multiple Vitamin (MULITIVITAMIN WITH MINERALS) TABS Take 1 tablet by mouth daily.      Marland Kitchen NIFEdipine (PROCARDIA XL/ADALAT-CC) 60 MG 24 hr tablet TAKE 1 TABLET BY MOUTH EVERY DAY  30 tablet  3  . olmesartan (BENICAR) 40 MG tablet Take 1 tablet (40 mg total) by mouth 2 (two) times daily.  30 tablet  1  . omeprazole (PRILOSEC) 20 MG capsule Take 20 mg by mouth daily.      . pioglitazone (ACTOS) 15 MG tablet Take 15 mg by mouth daily.      . potassium chloride (K-DUR,KLOR-CON) 10 MEQ tablet Take 20 mEq by mouth daily.      . rosuvastatin (CRESTOR) 20 MG tablet Take 10 mg by mouth at bedtime as needed and may repeat dose one time if needed.       . traMADol (ULTRAM) 50 MG tablet Take 1 tablet (50 mg total) by mouth 2 (two) times daily.  60 tablet  5  . acetaminophen (TYLENOL) 325 MG tablet Take 325 mg by mouth every 6 (six) hours as needed for pain. For pain      . nitroGLYCERIN (NITROSTAT) 0.4 MG SL tablet Place 0.4 mg under the tongue every 5 (five) minutes as needed for chest pain.       No current facility-administered medications for this visit.    Physical Exam Obese 80 yo woman in NAD Alert and oriented, no focal deficits No carotid bruits Cardiac regular rate and rhythm, positive diastolic murmur Lungs diminished breath sounds bilaterally, no wheezing   Diagnostic Tests: CT chest final reading pending I see no difference in comparison to side by side with the scan from January of this year  Impression: 80 year old woman with multiple medical problems who has been followed for a long time now for multiple carcinoid syndrome and an ascending aortic aneurysm. Her aneurysm is measured by radiology at 5.4 cm, however that is a tangential  measurement. The largest diameter I see on the coronal and sagittal views is 4.6-4.7 cm when measured perpendicular to the axis of blood flow. In any event I don't think she is a candidate for an elective repair. She and her daughter do understand that she is at risk for rupture or dissection and blood pressure control is important to manage that risk.  I also don't see any difference in her bilateral lung nodules.   Plan: We'll review final CT report when available  I will plan to see her back in 6 months with a repeat CT.

## 2014-07-18 ENCOUNTER — Encounter: Payer: Self-pay | Admitting: Cardiovascular Disease

## 2014-07-18 ENCOUNTER — Ambulatory Visit (INDEPENDENT_AMBULATORY_CARE_PROVIDER_SITE_OTHER): Payer: Medicare Other | Admitting: Cardiovascular Disease

## 2014-07-18 VITALS — BP 134/58 | HR 69 | Ht 59.0 in | Wt 172.0 lb

## 2014-07-18 DIAGNOSIS — D3A00 Benign carcinoid tumor of unspecified site: Secondary | ICD-10-CM

## 2014-07-18 DIAGNOSIS — E119 Type 2 diabetes mellitus without complications: Secondary | ICD-10-CM

## 2014-07-18 DIAGNOSIS — I719 Aortic aneurysm of unspecified site, without rupture: Secondary | ICD-10-CM

## 2014-07-18 DIAGNOSIS — I10 Essential (primary) hypertension: Secondary | ICD-10-CM

## 2014-07-18 NOTE — Progress Notes (Signed)
Patient ID: [name], female   DOB: 1932/05/29, 80 y.o.   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 I reviewed a letter from Dr. Arlyce Dice indicating that her ascending aorta had reached 5 cm. She was referred to [facility] for further evaluation. She does not strike me as an ideal operative candidate. Her hypertension has been under good control. She has had her norvas stopped and Procardia increased to 60 mg which I think is fine. She has significant lower extremity edema from varicose veins and this is harder to treat while on this particular oral hypoglycemic. Otherwise she's not had any significant chest pain. She has mild chronic edema she has mild chronic exertional dyspnea. I believe she sees Dr. Maryellen Pile for her carcinoid CT done in June showed throracic aneurysm of 5.7 cm Seen by Dr Roxan Hockey and not thought to be an operative candidate  Study Conclusions  - Left ventricle: The cavity size was normal. Wall thickness was increased in a pattern of moderate LVH. Systolic function was normal. The estimated ejection fraction was in the range of 55% to 60%. Wall motion was normal; there were no regional wall motion abnormalities. - Aortic valve: Sclerosis without stenosis. Moderate regurgitation. - Mitral valve: Calcified annulus. Mild regurgitation. - Right ventricle: The cavity size was normal. Systolic function was normal. - Right atrium: The atrium was mildly dilated. - Pulmonary arteries: PA peak pressure: 60mm Hg (S).  Noncontrast CT 5/13 With fairly stable nodules   Contrast CT 8/15 IMPRESSION:  Stable aneurysmal dilatation of ascending thoracic aorta 5.4 x 5.0  cm.  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  Numerous pulmonary nodules throughout both lungs, majority of which  appear stable though  there appear to be a few new small nodules at  the RIGHT lung base though potentially these could have been  obscured on prior imaging due to differences in slice averaging.  Metastatic disease not completely excluded, recommend continued  surveillance in this patient with a history of malignancy.  REviewed CT and this is tangential measurement closer to 4.7 cm    ROS: Denies fever, malais, weight loss, blurry vision, decreased visual acuity, cough, sputum, SOB, hemoptysis, pleuritic pain, palpitaitons, heartburn, abdominal pain, melena, lower extremity edema, claudication, or rash.  All other systems reviewed and negative  General: Affect appropriate Overweight r[place]ian female  HEENT: normal Neck supple with no adenopathy JVP normal no bruits no thyromegaly Lungs clear with no wheezing and good diaphragmatic motion Heart:  S1/S2 1/6 SEM murmur, no rub, gallop or click PMI normal Abdomen: benighn, BS positve, no tenderness, no AAA no bruit.  No HSM or HJR Distal pulses intact with no bruits Plus one  Edema bilaterally and varicosities Neuro non-focal Skin warm and dry No muscular weakness   Current Outpatient Prescriptions  Medication Sig Dispense Refill  . acetaminophen (TYLENOL) 325 MG tablet Take 325 mg by mouth every 6 (six) hours as needed for pain. For pain      . ADVAIR HFA 230-21 MCG/ACT inhaler Inhale 2 puffs into the lungs 2 (two) times daily.       Marland Kitchen aspirin 81 MG tablet Take 81 mg by mouth daily.       Marland Kitchen FeFum-FePoly-FA-B Cmp-C-Biot (INTEGRA PLUS) CAPS TAKE ONE CAPSULE BY MOUTH EVERY DAY *NOT COVERED*  30 capsule  5  . furosemide (LASIX) 20  MG tablet Take 20 mg by mouth daily as needed for fluid.      Marland Kitchen glimepiride (AMARYL) 1 MG tablet Take 1 mg by mouth daily.       . indapamide (LOZOL) 2.5 MG tablet Take 1 tablet (2.5 mg total) by mouth every morning.  30 tablet  6  . Multiple Vitamin (MULITIVITAMIN WITH MINERALS) TABS Take 1 tablet by mouth daily.      Marland Kitchen  NIFEdipine (PROCARDIA XL/ADALAT-CC) 60 MG 24 hr tablet TAKE 1 TABLET BY MOUTH EVERY DAY  30 tablet  3  . nitroGLYCERIN (NITROSTAT) 0.4 MG SL tablet Place 0.4 mg under the tongue every 5 (five) minutes as needed for chest pain.      Marland Kitchen olmesartan (BENICAR) 40 MG tablet Take 1 tablet (40 mg total) by mouth 2 (two) times daily.  30 tablet  1  . omeprazole (PRILOSEC) 20 MG capsule Take 20 mg by mouth daily.      . pioglitazone (ACTOS) 15 MG tablet Take 15 mg by mouth daily.      . potassium chloride (K-DUR,KLOR-CON) 10 MEQ tablet Take 20 mEq by mouth daily.      . rosuvastatin (CRESTOR) 20 MG tablet Take 10 mg by mouth at bedtime as needed and may repeat dose one time if needed.       . traMADol (ULTRAM) 50 MG tablet Take 1 tablet (50 mg total) by mouth 2 (two) times daily.  60 tablet  5  . montelukast (SINGULAIR) 10 MG tablet        No current facility-administered medications for this visit.    Allergies  Review of patient's allergies indicates no known allergies.  Electrocardiogram:  07/12/13  SR rate 82 PR 288 LAFB  LVH  Today no significant change SR rate 69 LAFB PR 318 LVH   Assessment and Plan

## 2014-07-18 NOTE — Assessment & Plan Note (Signed)
Well controlled.  Continue current medications and low sodium Dash type diet.    

## 2014-07-18 NOTE — Patient Instructions (Addendum)
Your physician wants you to follow-up in:   [place] will receive a reminder letter in the mail two months in advance. If you don't receive a letter, please call our office to schedule the follow-up appointment. Your physician recommends that you continue on your current medications as directed. Please refer to the Current Medication list given to you today. MAY   TAKE  MAG  OX  OVER T HE  COUNTER  AS  DIRECTED  ON PACKAGING

## 2014-07-18 NOTE — Assessment & Plan Note (Signed)
Discussed low carb diet.  Target hemoglobin A1c is 6.5 or less.  Continue current medications.  

## 2014-07-18 NOTE — Assessment & Plan Note (Signed)
F/U CT at discretion of Dr Roxan Hockey  Last measure 4.7 cm  If she is not a candidate for surgery due to age and prior lung surgery with ongoing carcinoid not clear why repeat CT scans needed

## 2014-07-18 NOTE — Assessment & Plan Note (Signed)
Lung exam stable  Multiple nodules on recent CT but stable  F/u Dr Maryellen Pile

## 2014-07-23 ENCOUNTER — Other Ambulatory Visit: Payer: Self-pay

## 2014-07-23 MED ORDER — OLMESARTAN MEDOXOMIL 40 MG PO TABS: 40.0000 mg | ORAL_TABLET | Freq: Two times a day (BID) | ORAL | Status: DC

## 2014-07-25 ENCOUNTER — Other Ambulatory Visit: Payer: Self-pay | Admitting: *Deleted

## 2014-07-25 MED ORDER — OLMESARTAN MEDOXOMIL 40 MG PO TABS: 40.0000 mg | ORAL_TABLET | Freq: Two times a day (BID) | ORAL | Status: DC

## 2014-08-05 ENCOUNTER — Other Ambulatory Visit (HOSPITAL_BASED_OUTPATIENT_CLINIC_OR_DEPARTMENT_OTHER): Payer: Medicare Other

## 2014-08-05 ENCOUNTER — Ambulatory Visit (HOSPITAL_COMMUNITY)
Admission: RE | Admit: 2014-08-05 | Discharge: 2014-08-05 | Disposition: A | Discharge status: Home / Self Care | Payer: Medicare Other | Source: Ambulatory Visit | Attending: Internal Medicine | Admitting: Internal Medicine

## 2014-08-05 DIAGNOSIS — D3A00 Benign carcinoid tumor of unspecified site: Secondary | ICD-10-CM

## 2014-08-05 DIAGNOSIS — D3A090 Benign carcinoid tumor of the bronchus and lung: Secondary | ICD-10-CM | POA: Insufficient documentation

## 2014-08-05 DIAGNOSIS — C7A090 Malignant carcinoid tumor of the bronchus and lung: Secondary | ICD-10-CM

## 2014-08-05 LAB — COMPREHENSIVE METABOLIC PANEL (CC13)
ALT: 16 U/L (ref 0–55)
AST: 21 U/L (ref 5–34)
Albumin: 3.5 g/dL (ref 3.5–5.0)
Alkaline Phosphatase: 96 U/L (ref 40–150)
Anion Gap: 10 mEq/L (ref 3–11)
BUN: 23.1 mg/dL (ref 7.0–26.0)
CALCIUM: 9.5 mg/dL (ref 8.4–10.4)
CHLORIDE: 102 meq/L (ref 98–109)
CO2: 29 mEq/L (ref 22–29)
CREATININE: 0.8 mg/dL (ref 0.6–1.1)
Glucose: 156 mg/dl — ABNORMAL HIGH (ref 70–140)
Potassium: 4.2 mEq/L (ref 3.5–5.1)
Sodium: 141 mEq/L (ref 136–145)
Total Bilirubin: 0.35 mg/dL (ref 0.20–1.20)
Total Protein: 7.1 g/dL (ref 6.4–8.3)

## 2014-08-05 LAB — CBC WITH DIFFERENTIAL/PLATELET
BASO%: 0.3 % (ref 0.0–2.0)
BASOS ABS: 0 10*3/uL (ref 0.0–0.1)
EOS%: 2.1 % (ref 0.0–7.0)
Eosinophils Absolute: 0.2 10*3/uL (ref 0.0–0.5)
HCT: 38.5 % (ref 34.8–46.6)
HEMOGLOBIN: 12.3 g/dL (ref 11.6–15.9)
LYMPH#: 1.5 10*3/uL (ref 0.9–3.3)
LYMPH%: 19.9 % (ref 14.0–49.7)
MCH: 29.4 pg (ref 25.1–34.0)
MCHC: 31.9 g/dL (ref 31.5–36.0)
MCV: 92.1 fL (ref 79.5–101.0)
MONO#: 0.5 10*3/uL (ref 0.1–0.9)
MONO%: 6.7 % (ref 0.0–14.0)
NEUT#: 5.2 10*3/uL (ref 1.5–6.5)
NEUT%: 71 % (ref 38.4–76.8)
PLATELETS: 246 10*3/uL (ref 145–400)
RBC: 4.18 10*6/uL (ref 3.70–5.45)
RDW: 13.7 % (ref 11.2–14.5)
WBC: 7.3 10*3/uL (ref 3.9–10.3)

## 2014-08-07 ENCOUNTER — Ambulatory Visit: Payer: Medicare Other | Admitting: Internal Medicine

## 2014-08-07 ENCOUNTER — Telehealth: Payer: Self-pay | Admitting: Internal Medicine

## 2014-08-07 DIAGNOSIS — I351 Nonrheumatic aortic (valve) insufficiency: Secondary | ICD-10-CM | POA: Insufficient documentation

## 2014-08-07 DIAGNOSIS — I38 Endocarditis, valve unspecified: Secondary | ICD-10-CM | POA: Insufficient documentation

## 2014-08-07 NOTE — Telephone Encounter (Signed)
pt called to r/s appt..done...pt aware of new d.t °

## 2014-08-14 ENCOUNTER — Ambulatory Visit (HOSPITAL_BASED_OUTPATIENT_CLINIC_OR_DEPARTMENT_OTHER): Payer: Medicare Other | Admitting: Internal Medicine

## 2014-08-14 ENCOUNTER — Telehealth: Payer: Self-pay | Admitting: Internal Medicine

## 2014-08-14 ENCOUNTER — Encounter: Payer: Self-pay | Admitting: Internal Medicine

## 2014-08-14 VITALS — BP 143/66 | HR 64 | Temp 98.4°F | Resp 22 | Ht 59.0 in | Wt 170.0 lb

## 2014-08-14 DIAGNOSIS — Z86012 Personal history of benign carcinoid tumor: Secondary | ICD-10-CM

## 2014-08-14 DIAGNOSIS — D3A00 Benign carcinoid tumor of unspecified site: Secondary | ICD-10-CM

## 2014-08-14 NOTE — Progress Notes (Signed)
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   PRIOR THERAPY:  1. Status post resection of multiple nodules from the left lung under the care of Dr Arlyce Dice on July 15, 2005. 2. Status post repeat bronchoscopy with endobronchial excision of the right upper lobe tumor and laser bronchoscopy under the care of Dr Arlyce Dice on October 08, 2009.  CURRENT THERAPY: Observation.  INTERVAL HISTORY: [name] 80 y.o. female returns to the clinic today for annual followup visit accompanied by her daughter. The patient is feeling fine today with no specific complaints except for occasional dry cough that has been going on for years and she is followed by a pulmonologist at [facility]. She denied having any significant chest pain, shortness of breath or hemoptysis. The patient denied having any nausea or vomiting. She has no significant weight loss or night sweats. She had repeat chest x-ray as well as CT scan of the chest performed recently and she is here for evaluation and discussion of her imaging and lab results.   MEDICAL HISTORY: Past Medical History  Diagnosis Date  . Diabetes mellitus   . Hypertension   . Coronary artery disease   . Aortic insufficiency   . Hyperlipidemia   . Aortic aneurysm   . Hypercholesterolemia   . COPD (chronic obstructive pulmonary disease)   . Bronchitis   . Edema   . GERD (gastroesophageal reflux disease)   . Aortic valve disease     stable  . Pain in joint, forearm   . Pain in joint, hand   . Pain in joint, lower leg   . Difficulty in walking(719.7)   . Disturbance of skin sensation   . Cancer     ALLERGIES:  has No Known Allergies.  MEDICATIONS:  Current Outpatient Prescriptions  Medication Sig Dispense Refill  . acetaminophen (TYLENOL) 325  MG tablet Take 325 mg by mouth every 6 (six) hours as needed for pain. For pain      . ADVAIR HFA 230-21 MCG/ACT inhaler Inhale 2 puffs into the lungs 2 (two) times daily.       Marland Kitchen aspirin 81 MG tablet Take 81 mg by mouth daily.       Marland Kitchen FeFum-FePoly-FA-B Cmp-C-Biot (INTEGRA PLUS) CAPS TAKE ONE CAPSULE BY MOUTH EVERY DAY *NOT COVERED*  30 capsule  5  . furosemide (LASIX) 20 MG tablet Take 20 mg by mouth daily as needed for fluid.      Marland Kitchen glimepiride (AMARYL) 1 MG tablet Take 1 mg by mouth daily.       . indapamide (LOZOL) 2.5 MG tablet Take 1 tablet (2.5 mg total) by mouth every morning.  30 tablet  6  . montelukast (SINGULAIR) 10 MG tablet       . Multiple Vitamin (MULITIVITAMIN WITH MINERALS) TABS Take 1 tablet by mouth daily.      Marland Kitchen NIFEdipine (PROCARDIA XL/ADALAT-CC) 60 MG 24 hr tablet TAKE 1 TABLET BY MOUTH EVERY DAY  30 tablet  3  . nitroGLYCERIN (NITROSTAT) 0.4 MG SL tablet Place 0.4 mg under the tongue every 5 (five) minutes as needed for chest pain.      Marland Kitchen olmesartan (BENICAR) 40 MG tablet Take 1 tablet (40 mg total) by mouth 2 (two) times daily.  60 tablet  10  . omeprazole (PRILOSEC) 20  MG capsule Take 20 mg by mouth daily.      . pioglitazone (ACTOS) 15 MG tablet Take 15 mg by mouth daily.      . potassium chloride (K-DUR,KLOR-CON) 10 MEQ tablet Take 20 mEq by mouth daily.      . rosuvastatin (CRESTOR) 20 MG tablet Take 10 mg by mouth at bedtime as needed and may repeat dose one time if needed.       . traMADol (ULTRAM) 50 MG tablet Take 1 tablet (50 mg total) by mouth 2 (two) times daily.  60 tablet  5   No current facility-administered medications for this visit.    SURGICAL HISTORY:  Past Surgical History  Procedure Laterality Date  . Pars plana vitrectomy      right eye  . Posterior capsulectomy      right eye  . Pars plana vitrectomy w/ endophotocoagulation      right eye  . Fiberoptic bronch with endobronchial u/s  07/02/2010  . Video bronchoscopy  12/09/2009  .  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 Bronch with endobronchial biopies  09/15/2009  . Colonoscopy N/A 09/13/2013    Procedure: COLONOSCOPY;  Surgeon: Jeryl Columbia, MD;  Location: [facility];  Service: Endoscopy;  Laterality: N/A;    REVIEW OF SYSTEMS:  A comprehensive review of systems was negative except for: Respiratory: positive for cough   PHYSICAL EXAMINATION: General appearance: alert, cooperative and no distress Head: Normocephalic, without obvious abnormality, atraumatic Neck: no adenopathy, no JVD, supple, symmetrical, trachea midline and thyroid not enlarged, symmetric, no tenderness/mass/nodules Lymph nodes: Cervical, supraclavicular, and axillary nodes normal. Resp: clear to auscultation bilaterally Cardio: normal apical impulse GI: soft, non-tender; bowel sounds normal; no masses,  no organomegaly Extremities: extremities normal, atraumatic, no cyanosis or edema  ECOG PERFORMANCE STATUS: 1 - Symptomatic but completely ambulatory  Blood pressure 143/66, pulse 64, temperature 98.4 F (36.9 C), temperature source Oral, resp. rate 22, height 4\' 11"  (1.499 m), weight 170 lb (77.111 kg), SpO2 95.00%.  LABORATORY DATA: Lab Results  Component Value Date   WBC 7.3 08/05/2014   HGB 12.3 08/05/2014   HCT 38.5 08/05/2014   MCV 92.1 08/05/2014   PLT 246 08/05/2014      Chemistry      Component Value Date/Time   NA 141 08/05/2014 1051   NA 137 06/12/2012 2124   NA 144 12/02/2011 1053   K 4.2 08/05/2014 1051   K 4.0 06/12/2012 2124   K 3.7 12/02/2011 1053   CL 104 07/07/2012 1435   CL 98 06/12/2012 2124   CL 100 12/02/2011 1053   CO2 29 08/05/2014 1051   CO2 28 06/12/2012 2124   CO2 30 12/02/2011 1053   BUN 23.1 08/05/2014 1051   BUN 25* 05/29/2014 1551   BUN 20 12/02/2011 1053   CREATININE 0.8 08/05/2014 1051   CREATININE 0.60 05/29/2014 1543   CREATININE 1.15* 06/12/2012 2124      Component Value Date/Time   CALCIUM 9.5 08/05/2014  1051   CALCIUM 9.3 06/12/2012 2124   CALCIUM 9.1 12/02/2011 1053   ALKPHOS 96 08/05/2014 1051   ALKPHOS 73 12/02/2011 1053   ALKPHOS 79 10/21/2011 0630   AST 21 08/05/2014 1051   AST 30 12/02/2011 1053   AST 22 10/21/2011 0630   ALT 16 08/05/2014 1051   ALT 22 12/02/2011 1053   ALT 17 10/21/2011 0630   BILITOT 0.35 08/05/2014 1051   BILITOT 0.80 12/02/2011 1053  BILITOT 0.3 10/21/2011 0630       RADIOGRAPHIC STUDIES: Dg Chest 1 View  08/05/2014   CLINICAL DATA:  Staging carcinoid lung tumors.  EXAM: CHEST - 1 VIEW  COMPARISON:  Chest CT 06/04/2014  FINDINGS: Single view of the chest demonstrates an enlarged ascending thoracic aorta. Heart size is upper limits of normal but unchanged. There are scattered small pulmonary nodules throughout both lungs. Difficult to evaluate for progression of disease based on this single view chest radiograph. Atherosclerotic calcifications at the aortic arch. No focal airspace disease. Few streaky densities in the lungs could represent atelectasis.  IMPRESSION: Scattered bilateral small pulmonary nodules. Difficult to evaluate for interval change.  Enlargement of the ascending thoracic aorta consistent with known aneurysm.  Few streaky lung densities are most compatible with atelectasis. No significant airspace disease.   Electronically Signed   By: Markus Daft M.D.   On: 08/05/2014 15:07   ASSESSMENT AND PLAN: This is a very pleasant 80 years old [place] female with history of carcinoid tumor of the lung status post resection and has been observation since December of 2010 with no significant evidence for disease progression. I discussed the chest x-ray lab result with the patient and her daughter today. I recommended for her to continue on observation with repeat CBC, comprehensive metabolic panel and CT scan of the chest without contrast in one year. She was advised to call immediately if she has any concerning symptoms in the interval.   The patient voices  understanding of current disease status and treatment options and is in agreement with the current care plan.  All questions were answered. The patient knows to call the clinic with any problems, questions or concerns. We can certainly see the patient much sooner if necessary.  Disclaimer: This note was dictated with voice recognition software. Similar sounding words can inadvertently be transcribed and may be missed upon review.

## 2014-08-14 NOTE — Telephone Encounter (Signed)
LM to Confirm appt d/t for Oct. 2016. mailed cal.

## 2014-08-26 ENCOUNTER — Encounter: Payer: Self-pay | Admitting: Internal Medicine

## 2014-09-02 ENCOUNTER — Ambulatory Visit: Payer: Medicare Other | Admitting: Physical Medicine & Rehabilitation

## 2014-09-11 ENCOUNTER — Other Ambulatory Visit: Payer: Self-pay | Admitting: Cardiovascular Disease

## 2014-09-12 ENCOUNTER — Other Ambulatory Visit: Payer: Self-pay

## 2014-09-12 MED ORDER — NIFEDIPINE ER OSMOTIC RELEASE 60 MG PO TB24: 60.0000 mg | ORAL_TABLET | Freq: Every day | ORAL | Status: DC

## 2014-09-16 ENCOUNTER — Ambulatory Visit: Payer: Medicare Other | Admitting: Physical Medicine & Rehabilitation

## 2014-09-23 ENCOUNTER — Other Ambulatory Visit: Payer: Self-pay | Admitting: Cardiovascular Disease

## 2014-10-04 ENCOUNTER — Ambulatory Visit (HOSPITAL_BASED_OUTPATIENT_CLINIC_OR_DEPARTMENT_OTHER): Payer: Medicare Other | Admitting: Physical Medicine & Rehabilitation

## 2014-10-04 ENCOUNTER — Encounter: Payer: Self-pay | Admitting: Physical Medicine & Rehabilitation

## 2014-10-04 ENCOUNTER — Encounter: Payer: Medicare Other | Attending: Physical Medicine & Rehabilitation

## 2014-10-04 VITALS — BP 123/46 | HR 77 | Resp 14 | Wt 174.0 lb

## 2014-10-04 DIAGNOSIS — M47816 Spondylosis without myelopathy or radiculopathy, lumbar region: Secondary | ICD-10-CM

## 2014-10-04 NOTE — Patient Instructions (Signed)
Lumbar medial branch facet injections may be helpful for low back  Please call to schedule

## 2014-10-04 NOTE — Progress Notes (Signed)
Subjective:    Patient ID: [name], female    DOB: 1932/06/06, 80 y.o.   MRN: [id number]  HPI No new concern. Continues with low back pain. Reviewed last x-ray No pain around the mid back area pain is around the buttocks area mainly No radiating pain to the feet Pain Inventory Average Pain 4 Pain Right Now 4 My pain is .  In the last 24 hours, has pain interfered with the following? General activity 3 Relation with others 3 Enjoyment of life 3 What TIME of day is your pain at its worst? na Sleep (in general) NA  Pain is worse with: sitting and standing Pain improves with: na Relief from Meds: 4  Mobility ability to climb steps?  no do you drive?  no  Function not employed: date last employed . I need assistance with the following:  meal prep, household duties and shopping  Neuro/Psych bowel control problems trouble walking  Prior Studies Any changes since last visit?  no  Clinical Data: Back pain. Right buttock and groin pain for years. No known injury.  LUMBAR SPINE - 2-3 VIEW 11-25-2010  Comparison: 09/30/2010 chest x-ray.  Findings: Moderate degenerative changes lower thoracic and lumbar spine. Minimal scoliosis.  Slight indistinctness of the endplates surrounding the T11-12 disc space. This is less well appreciated on the lateral view. If there were any clinical suspicion of infection, follow-up imaging (preferably MRI was recommended).  Vascular calcifications.  IMPRESSION: Slight indistinctness of the endplates surrounding the T11-12 disc space. This is less well appreciated on the lateral view. If there were any clinical suspicion of infection, follow-up imaging (preferably MRI was recommended).  Moderate degenerative changes lower thoracic and lumbar spine.  Minimal scoliosis.  Vascular calcifications Physicians involved in your care Any changes since last visit?  no   Family History  Problem Relation Age of Onset    . Hypertension Mother   . Hypertension Father    History   Social History  . Marital Status: Married    Spouse Name: N/A    Number of Children: N/A  . Years of Education: N/A   Social History Main Topics  . Smoking status: Never Smoker   . Smokeless tobacco: Never Used  . Alcohol Use: No  . Drug Use: No  . Sexual Activity: Not Currently   Other Topics Concern  . None   Social History Narrative   Past Surgical History  Procedure Laterality Date  . Pars plana vitrectomy      right eye  . Posterior capsulectomy      right eye  . Pars plana vitrectomy w/ endophotocoagulation      right eye  . Fiberoptic bronch with endobronchial u/s  07/02/2010  . Video bronchoscopy  12/09/2009  . 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 Bronch with endobronchial biopies  09/15/2009  . Colonoscopy N/A 09/13/2013    Procedure: COLONOSCOPY;  Surgeon: Jeryl Columbia, MD;  Location: [facility];  Service: Endoscopy;  Laterality: N/A;   Past Medical History  Diagnosis Date  . Diabetes mellitus   . Hypertension   . Coronary artery disease   . Aortic insufficiency   . Hyperlipidemia   . Aortic aneurysm   . Hypercholesterolemia   . COPD (chronic obstructive pulmonary disease)   . Bronchitis   . Edema   . GERD (gastroesophageal reflux disease)   . Aortic valve disease     stable  . Pain in joint,  forearm   . Pain in joint, hand   . Pain in joint, lower leg   . Difficulty in walking(719.7)   . Disturbance of skin sensation   . Cancer    BP 123/46 mmHg  Pulse 77  Resp 14  Wt 174 lb (78.926 kg)  SpO2 94%  Opioid Risk Score:   Fall Risk Score: High Fall Risk (>13 points) (previously educated and given handout)  Review of Systems  Respiratory: Positive for cough.   Gastrointestinal: Positive for abdominal pain.  Endocrine:       High blood sugar  Musculoskeletal: Positive for gait problem.  All other systems reviewed and are  negative.      Objective:   Physical Exam  Constitutional: She is oriented to person, place, and time. She appears well-developed and well-nourished.  HENT:  Head: Normocephalic and atraumatic.  Eyes: Conjunctivae and EOM are normal. Pupils are equal, round, and reactive to light.  Neck: Normal range of motion.  Musculoskeletal:       Right hip: She exhibits tenderness.       Left hip: She exhibits decreased range of motion and tenderness.       Lumbar back: She exhibits decreased range of motion and tenderness. She exhibits no spasm.  Neurological: She is alert and oriented to person, place, and time.  Psychiatric: She has a normal mood and affect.  Nursing note and vitals reviewed.   Motor strength is 4/5 bilateral hip flexors 5/5 bilateral knee extensors bilateral ankle dorsiflexors.  No pain around the lower thoracic area    Negative straight leg raising Assessment & Plan:  1. Lumbar spondylosis with chronic pain last x-ray several years ago demonstrating some arthritis, will recheck given gradually increasing symptoms. Schedule for lumbar medial branch blocks once family researches this Continue tramadol as needed for pain We discussed physical therapy however patient has been through this doesn't feel like she wants to try to again Discussed exercise including using her husband's exercise bike which would be helpful overall for her pain as well as conditioning

## 2014-10-04 NOTE — Progress Notes (Deleted)
   Subjective:    Patient ID: [name], female    DOB: 1932/05/21, 80 y.o.   MRN: [id number]  HPI    Review of Systems     Objective:   Physical Exam        Assessment & Plan:

## 2014-10-21 ENCOUNTER — Encounter: Payer: Self-pay | Admitting: *Deleted

## 2014-10-22 ENCOUNTER — Encounter: Payer: Self-pay | Admitting: Obstetrics & Gynecology

## 2014-10-30 ENCOUNTER — Other Ambulatory Visit: Payer: Self-pay | Admitting: *Deleted

## 2014-10-30 DIAGNOSIS — I351 Nonrheumatic aortic (valve) insufficiency: Secondary | ICD-10-CM

## 2014-10-30 DIAGNOSIS — R911 Solitary pulmonary nodule: Secondary | ICD-10-CM

## 2014-11-10 ENCOUNTER — Other Ambulatory Visit: Payer: Self-pay | Admitting: Internal Medicine

## 2014-12-10 ENCOUNTER — Telehealth: Payer: Self-pay | Admitting: Cardiovascular Disease

## 2014-12-10 ENCOUNTER — Other Ambulatory Visit: Payer: Self-pay

## 2014-12-10 MED ORDER — INDAPAMIDE 2.5 MG PO TABS: ORAL_TABLET | ORAL | Status: DC

## 2014-12-10 NOTE — Telephone Encounter (Signed)
New message      Refill indapamide 2.5mg  to [facility] at [address]

## 2014-12-17 ENCOUNTER — Ambulatory Visit: Payer: Medicare Other | Admitting: Thoracic Surgery (Cardiothoracic Vascular Surgery)

## 2014-12-17 ENCOUNTER — Telehealth: Payer: Self-pay | Admitting: *Deleted

## 2014-12-17 ENCOUNTER — Inpatient Hospital Stay: Admission: RE | Admit: 2014-12-17 | Payer: Medicare Other | Source: Ambulatory Visit

## 2014-12-19 ENCOUNTER — Telehealth: Payer: Self-pay | Admitting: *Deleted

## 2015-01-07 ENCOUNTER — Other Ambulatory Visit: Payer: Self-pay | Admitting: Cardiovascular Disease

## 2015-01-07 ENCOUNTER — Encounter: Payer: Self-pay | Admitting: Physical Medicine & Rehabilitation

## 2015-01-07 ENCOUNTER — Ambulatory Visit (HOSPITAL_BASED_OUTPATIENT_CLINIC_OR_DEPARTMENT_OTHER): Payer: Medicare Other | Admitting: Physical Medicine & Rehabilitation

## 2015-01-07 ENCOUNTER — Encounter: Payer: Medicare Other | Attending: Physical Medicine & Rehabilitation

## 2015-01-07 VITALS — BP 134/74 | HR 74 | Resp 14

## 2015-01-07 DIAGNOSIS — M24551 Contracture, right hip: Secondary | ICD-10-CM

## 2015-01-07 DIAGNOSIS — M47816 Spondylosis without myelopathy or radiculopathy, lumbar region: Secondary | ICD-10-CM | POA: Diagnosis not present

## 2015-01-07 DIAGNOSIS — M25551 Pain in right hip: Secondary | ICD-10-CM

## 2015-01-07 DIAGNOSIS — M1611 Unilateral primary osteoarthritis, right hip: Secondary | ICD-10-CM | POA: Diagnosis not present

## 2015-01-07 DIAGNOSIS — M24559 Contracture, unspecified hip: Secondary | ICD-10-CM | POA: Insufficient documentation

## 2015-01-07 MED ORDER — DICLOFENAC SODIUM 1 % TD GEL: 2.0000 g | Freq: Four times a day (QID) | TRANSDERMAL | Status: DC

## 2015-01-07 NOTE — Patient Instructions (Signed)
Lidocaine cream to the hands as well as the right hip   Get an x-ray of the right hip  Return for right hip injection

## 2015-01-07 NOTE — Progress Notes (Signed)
Subjective:    Patient ID: [name], female    DOB: Jun 05, 1932, 81 y.o.   MRN: [id number]  HPI  81 year old female with history of lumbar spondylosis Right hip pain, worsened with ambulation, no recent trauma or falls. No fever or chills. Does not feel like this is coming from her back.  Pain Inventory Average Pain 4 Pain Right Now 5 My pain is constant, sharp, tingling and aching  In the last 24 hours, has pain interfered with the following? General activity 5 Relation with others 5 Enjoyment of life 5 What TIME of day is your pain at its worst? daytime Sleep (in general) Fair  Pain is worse with: walking Pain improves with: rest Relief from Meds: 3  Mobility walk without assistance ability to climb steps?  no do you drive?  no  Function retired  Neuro/Psych trouble walking  Prior Studies Any changes since last visit?  no  Physicians involved in your care Any changes since last visit?  no   Family History  Problem Relation Age of Onset  . Hypertension Mother   . Hypertension Father    History   Social History  . Marital Status: Married    Spouse Name: N/A  . Number of Children: N/A  . Years of Education: N/A   Social History Main Topics  . Smoking status: Never Smoker   . Smokeless tobacco: Never Used  . Alcohol Use: No  . Drug Use: No  . Sexual Activity: Not Currently   Other Topics Concern  . None   Social History Narrative   Past Surgical History  Procedure Laterality Date  . Pars plana vitrectomy      right eye  . Posterior capsulectomy      right eye  . Pars plana vitrectomy w/ endophotocoagulation      right eye  . Fiberoptic bronch with endobronchial u/s  07/02/2010  . Video bronchoscopy  12/09/2009  . 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 Bronch with endobronchial biopies  09/15/2009  . Colonoscopy N/A 09/13/2013    Procedure: COLONOSCOPY;  Surgeon: Jeryl Columbia, MD;   Location: [facility];  Service: Endoscopy;  Laterality: N/A;   Past Medical History  Diagnosis Date  . Diabetes mellitus   . Hypertension   . Coronary artery disease   . Aortic insufficiency   . Hyperlipidemia   . Aortic aneurysm   . Hypercholesterolemia   . COPD (chronic obstructive pulmonary disease)   . Bronchitis   . Edema   . GERD (gastroesophageal reflux disease)   . Aortic valve disease     stable  . Pain in joint, forearm   . Pain in joint, hand   . Pain in joint, lower leg   . Difficulty in walking(719.7)   . Disturbance of skin sensation   . Cancer    BP 134/74 mmHg  Pulse 74  Resp 14  SpO2 94%  Opioid Risk Score:   Fall Risk Score: Low Fall Risk (0-5 points)  Review of Systems  Constitutional: Negative.   HENT: Negative.   Eyes: Negative.   Respiratory: Positive for cough.   Cardiovascular: Negative.   Gastrointestinal: Negative.   Endocrine: Negative.        High blood sugar  Genitourinary: Negative.   Musculoskeletal: Positive for back pain.       Hip pain  Allergic/Immunologic: Negative.   Neurological:       Trouble walking  Hematological: Negative.  Psychiatric/Behavioral: Negative.        Objective:   Physical Exam  Constitutional: She is oriented to person, place, and time.  obese  HENT:  Head: Normocephalic and atraumatic.  Eyes: Conjunctivae and EOM are normal. Pupils are equal, round, and reactive to light.  Neck: Normal range of motion.  Musculoskeletal:       Right hip: She exhibits decreased range of motion. She exhibits no tenderness.  Left hip has good internal and external rotation without pain  Right hip has essentially no internal or external rotation abduction and abduction are moderately limited  Flexion right hip is full Extension is limited, has 10 right hip flexion contracture  No knee swelling on the right side no tenderness over the right thigh  Neurological: She is alert and oriented to person, place, and  time. She has normal strength.  Psychiatric: She has a normal mood and affect.  Nursing note and vitals reviewed.   Patient without joint erythema of the hands. She has mild osteoarthritic changes PIP DIP      Assessment & Plan:  1. Right hip contracture. Reviewed x-rays from 2012 demonstrated moderate degenerative arthritis at that time, will repeat films to compare, I suspect that her degenerative arthritis has progressed. We discussed orthopedic surgical referral however patient does not want this. She is willing to try medications. Given her age I would be reluctant to start her on oral nonsteroidal anti-inflammatories She is already on tramadol as well as acetaminophen with only partial relief. We discussed doubling up on the tramadol. Her daughter would like some type of patch like Lidoderm however I explained that she does not have a diagnosis for which Lidoderm is paid for  We discussed fluoroscopic guided injection, patient is willing to try this. No anticoagulants.  If the patient does not benefit from hip injection, consider Bu trans-patch  Given the degree of contracture I doubt physical therapy would be of much benefit  2. Hand pain and osteoarthritis, Voltaren gel

## 2015-01-09 ENCOUNTER — Ambulatory Visit (HOSPITAL_COMMUNITY)
Admission: RE | Admit: 2015-01-09 | Discharge: 2015-01-09 | Disposition: A | Discharge status: Home / Self Care | Payer: Medicare Other | Source: Ambulatory Visit | Attending: Physical Medicine & Rehabilitation | Admitting: Physical Medicine & Rehabilitation

## 2015-01-09 DIAGNOSIS — M25551 Pain in right hip: Secondary | ICD-10-CM | POA: Diagnosis present

## 2015-01-09 DIAGNOSIS — M1611 Unilateral primary osteoarthritis, right hip: Secondary | ICD-10-CM | POA: Insufficient documentation

## 2015-01-10 ENCOUNTER — Other Ambulatory Visit: Payer: Self-pay | Admitting: *Deleted

## 2015-01-10 ENCOUNTER — Other Ambulatory Visit: Payer: Self-pay | Admitting: Cardiovascular Disease

## 2015-01-10 MED ORDER — NIFEDIPINE ER OSMOTIC RELEASE 60 MG PO TB24: 60.0000 mg | ORAL_TABLET | Freq: Every day | ORAL | Status: DC

## 2015-01-10 NOTE — Telephone Encounter (Signed)
Please review for refill, thank you.

## 2015-01-31 ENCOUNTER — Encounter: Payer: Self-pay | Admitting: Physical Medicine & Rehabilitation

## 2015-01-31 ENCOUNTER — Encounter: Payer: Medicare Other | Attending: Physical Medicine & Rehabilitation

## 2015-01-31 ENCOUNTER — Ambulatory Visit (HOSPITAL_BASED_OUTPATIENT_CLINIC_OR_DEPARTMENT_OTHER): Payer: Medicare Other | Admitting: Physical Medicine & Rehabilitation

## 2015-01-31 VITALS — BP 162/60 | HR 68 | Resp 14

## 2015-01-31 DIAGNOSIS — M47816 Spondylosis without myelopathy or radiculopathy, lumbar region: Secondary | ICD-10-CM | POA: Insufficient documentation

## 2015-01-31 DIAGNOSIS — M25551 Pain in right hip: Secondary | ICD-10-CM

## 2015-01-31 NOTE — Progress Notes (Signed)
Subjective:    Patient ID: [name], female    DOB: Mar 01, 1932, 81 y.o.   MRN: [id number]  HPI 81 year old female with hip pain that has been going on for several years. X-ray of the right hip performed in 2012 demonstrated moderate osteoarthritis. The left hip showed no evidence of osteoarthritis. Over the last 2 months the patient is had increasing hip pain. She has tried over-the-counter analgesics as well as tramadol without much success. She states she tried physical therapy in the past but this was not helpful either. She has no numbness or tingling in the leg. Has not tried any injections. Pain Inventory Average Pain 7 Pain Right Now 8 My pain is constant, sharp, stabbing and aching  In the last 24 hours, has pain interfered with the following? General activity 5 Relation with others 5 Enjoyment of life 5 What TIME of day is your pain at its worst? daytime Sleep (in general) Fair  Pain is worse with: walking, standing and some activites Pain improves with: rest and medication Relief from Meds: 1  Mobility walk without assistance how many minutes can you walk? 10 ability to climb steps?  no do you drive?  no  Function retired  Neuro/Psych trouble walking  Prior Studies Any changes since last visit?  no  Physicians involved in your care Any changes since last visit?  no   Family History  Problem Relation Age of Onset  . Hypertension Mother   . Hypertension Father    History   Social History  . Marital Status: Married    Spouse Name: N/A  . Number of Children: N/A  . Years of Education: N/A   Social History Main Topics  . Smoking status: Never Smoker   . Smokeless tobacco: Never Used  . Alcohol Use: No  . Drug Use: No  . Sexual Activity: Not Currently   Other Topics Concern  . None   Social History Narrative   Past Surgical History  Procedure Laterality Date  . Pars plana vitrectomy      right eye  . Posterior capsulectomy     right eye  . Pars plana vitrectomy w/ endophotocoagulation      right eye  . Fiberoptic bronch with endobronchial u/s  07/02/2010  . Video bronchoscopy  12/09/2009  . 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 Bronch with endobronchial biopies  09/15/2009  . Colonoscopy N/A 09/13/2013    Procedure: COLONOSCOPY;  Surgeon: Jeryl Columbia, MD;  Location: [facility];  Service: Endoscopy;  Laterality: N/A;   Past Medical History  Diagnosis Date  . Diabetes mellitus   . Hypertension   . Coronary artery disease   . Aortic insufficiency   . Hyperlipidemia   . Aortic aneurysm   . Hypercholesterolemia   . COPD (chronic obstructive pulmonary disease)   . Bronchitis   . Edema   . GERD (gastroesophageal reflux disease)   . Aortic valve disease     stable  . Pain in joint, forearm   . Pain in joint, hand   . Pain in joint, lower leg   . Difficulty in walking(719.7)   . Disturbance of skin sensation   . Cancer    BP 162/60 mmHg  Pulse 68  Resp 14  SpO2 93%  Opioid Risk Score:   Fall Risk Score: Low Fall Risk (0-5 points)`1  Depression screen PHQ 2/9  Depression screen [facility] 2/9 01/07/2015  Decreased Interest 0  Down, Depressed, Hopeless 0  PHQ - 2 Score 0  Altered sleeping 3  Tired, decreased energy 2  Change in appetite 0  Feeling bad or failure about yourself  3  Trouble concentrating 0  Moving slowly or fidgety/restless 3  Suicidal thoughts 0  PHQ-9 Score 11     Review of Systems  Constitutional: Negative.   HENT: Negative.   Eyes: Negative.   Respiratory: Negative.   Cardiovascular: Negative.   Gastrointestinal: Negative.   Endocrine: Negative.   Genitourinary: Negative.   Musculoskeletal: Positive for myalgias and arthralgias.       Right hip pain  Skin: Negative.   Allergic/Immunologic: Negative.   Neurological:       Trouble walking  Hematological: Negative.   Psychiatric/Behavioral: Negative.        Objective:     Physical Exam  Constitutional: She is oriented to person, place, and time. She appears well-developed and well-nourished.  Musculoskeletal:       Right hip: She exhibits decreased range of motion. She exhibits no tenderness and no deformity.  Neurological: She is alert and oriented to person, place, and time.  Psychiatric: She has a normal mood and affect.  Nursing note and vitals reviewed.   Limited internal and external rotation of the right hip, mildly limited in the left hip. There is pain in the right groin area with internal and external rotation of the hip. There is full knee and ankle range of motion. Mild peripheral edema with mild stasis edema bilateral pretibial area Negative straight leg raising Ambulates with an antalgic gait decreased step length on the right side     Assessment & Plan:  1. Right hip osteoarthritis this has progressed from moderate in 2012 to severe in 2016. She has failed over-the-counter as well as mild narcotic analgesic medications. She has not tried intra-articular injection. We'll schedule her for this as she is reluctant to see a Psychologist, sport and exercise. I discussed this with her husband as well as her daughter. If she does not get good relief from the intra-articular injection, I would recommend surgical evaluation

## 2015-01-31 NOTE — Patient Instructions (Signed)
Try arthritis medicine take with food Try injection  If injection is not helpful then see Orthopedic surgeon

## 2015-02-11 ENCOUNTER — Ambulatory Visit (HOSPITAL_BASED_OUTPATIENT_CLINIC_OR_DEPARTMENT_OTHER): Payer: Medicare Other | Admitting: Physical Medicine & Rehabilitation

## 2015-02-11 ENCOUNTER — Encounter: Payer: Self-pay | Admitting: Physical Medicine & Rehabilitation

## 2015-02-11 VITALS — BP 146/68 | HR 80 | Resp 14

## 2015-02-11 DIAGNOSIS — M47816 Spondylosis without myelopathy or radiculopathy, lumbar region: Secondary | ICD-10-CM | POA: Diagnosis not present

## 2015-02-11 DIAGNOSIS — M1611 Unilateral primary osteoarthritis, right hip: Secondary | ICD-10-CM

## 2015-02-11 MED ORDER — TRAMADOL HCL 50 MG PO TABS: 50.0000 mg | ORAL_TABLET | Freq: Three times a day (TID) | ORAL | Status: DC | PRN

## 2015-02-11 NOTE — Progress Notes (Signed)
Aspiration/Injection Procedure Note [name] [id number] [birth year]  Procedure: Injection Indications: Severe right hip osteoarthritis  Procedure Details Consent: Risks of procedure as well as the alternatives and risks of each were explained to the (patient/caregiver).  Consent for procedure obtained. Time Out: Verified patient identification, verified procedure, site/side was marked, verified correct patient position, special equipment/implants available, medications/allergies/relevent history reviewed, required imaging and test results available.  Performed   Local Anesthesia Used:Lidocaine 1% plain; 39m Amount of Fluid Aspirated: minimal amount Character of Fluid: clear Fluid was sent for:NA A sterile dressing was applied.  Patient did tolerate procedure well. Estimated blood loss: 0 Fluoro guided Right intraarticular hip injection celestone '6mg'$ /ml x 123mand 4 ml 1% lidocaine KIRSTEINS,ANDREW E 02/11/2015, 2:28 PM

## 2015-02-11 NOTE — Progress Notes (Signed)
  PROCEDURE RECORD [facility]   Name: [name] DOB:05-15-1932 MRN: [id number]  Date:02/11/2015  Physician: Alysia Penna, MD    Nurse/CMA: Mancel Parsons  Allergies: No Known Allergies   Consent Signed: Yes.    Is patient diabetic? No.  CBG today? .  Pregnant: No. LMP: No LMP recorded. Patient has had a hysterectomy. (age [age])  Anticoagulants: no Anti-inflammatory: no Antibiotics: no  Procedure: Right Hip under Fluoro  Position: Supine Start Time: 2:19pm  End Time: 2:21pm  Fluoro Time:  12  RN/CMA Reathel Turi Ken Wesslin    Time 2:06pm 2:25pm    BP 146/80 153/52    Pulse 80 83    Respirations 14 14    O2 Sat 94 94    S/S 6 5    Pain Level 10/10  10/10     D/C home with daughter, [name] & O X 3, D/C instructions reviewed, and sits independently.

## 2015-02-13 ENCOUNTER — Ambulatory Visit: Payer: Medicare Other | Admitting: Physical Medicine & Rehabilitation

## 2015-02-14 ENCOUNTER — Ambulatory Visit: Payer: Medicare Other | Admitting: Physical Medicine & Rehabilitation

## 2015-02-18 ENCOUNTER — Ambulatory Visit: Payer: Medicare Other | Admitting: Physical Medicine & Rehabilitation

## 2015-02-18 ENCOUNTER — Other Ambulatory Visit: Payer: Self-pay | Admitting: Physical Medicine & Rehabilitation

## 2015-02-18 ENCOUNTER — Ambulatory Visit (HOSPITAL_BASED_OUTPATIENT_CLINIC_OR_DEPARTMENT_OTHER): Payer: Medicare Other | Admitting: Physical Medicine & Rehabilitation

## 2015-02-18 ENCOUNTER — Encounter: Payer: Self-pay | Admitting: Physical Medicine & Rehabilitation

## 2015-02-18 ENCOUNTER — Telehealth: Payer: Self-pay | Admitting: *Deleted

## 2015-02-18 VITALS — BP 140/74 | HR 80 | Resp 14

## 2015-02-18 DIAGNOSIS — M25551 Pain in right hip: Secondary | ICD-10-CM

## 2015-02-18 DIAGNOSIS — M1611 Unilateral primary osteoarthritis, right hip: Secondary | ICD-10-CM

## 2015-02-18 DIAGNOSIS — M24551 Contracture, right hip: Secondary | ICD-10-CM

## 2015-02-18 DIAGNOSIS — M47816 Spondylosis without myelopathy or radiculopathy, lumbar region: Secondary | ICD-10-CM | POA: Diagnosis present

## 2015-02-18 MED ORDER — ACETAMINOPHEN-CODEINE #4 300-60 MG PO TABS: 1.0000 | ORAL_TABLET | Freq: Three times a day (TID) | ORAL | Status: DC | PRN

## 2015-02-18 NOTE — Progress Notes (Signed)
Subjective:    Patient ID: [name], female    DOB: 01/29/1932, 81 y.o.   MRN: [id number]  HPI 81 year old female with end-stage osteoarthritis of the right hip who Has had increasing left hip pain over the last 6-8 weeks. X-ray 01/09/2015 demonstrated advanced osteoarthritis of the hip. She underwent left hip intra-articular injection under fluoroscopic guidance proxy one week ago which was not helpful. She has an appointment with orthopedic surgery Dr. Ihor Gully on June 1. She returns today wondering what all she can do to help with her pain in the meantime. We tried tramadol but this was not helpful, she also has been prescribed Tylenol for but has not picked this up yet. She tried one of her husband's hydrocodones but said it didn't really help. She ambulates with a walker and in fact ambulated from the car to the office which is more than 100 feet.  She's had no falls or other trauma to the hip No fevers or chills, no systemic illness. She has chronic low back pain but no other joint discomfort Pain Inventory Average Pain 10 Pain Right Now 10 My pain is constant, sharp and stabbing  In the last 24 hours, has pain interfered with the following? General activity 10 Relation with others 10 Enjoyment of life 10 What TIME of day is your pain at its worst? ALL Sleep (in general) Poor  Pain is worse with: walking, sitting, standing and some activites Pain improves with: NOTHING HELPS Relief from Meds: 0  Mobility use a walker how many minutes can you walk? 5 ability to climb steps?  no do you drive?  no  Function retired  Neuro/Psych trouble walking depression  Prior Studies Any changes since last visit?  no  Physicians involved in your care Any changes since last visit?  no   QUALITY OF LIFE IS EXTREMELY BAD. PATIENT IS GETTING NO RELIEF.     Family History  Problem Relation Age of Onset  . Hypertension Mother   . Hypertension Father    History   Social  History  . Marital Status: Married    Spouse Name: N/A  . Number of Children: N/A  . Years of Education: N/A   Social History Main Topics  . Smoking status: Never Smoker   . Smokeless tobacco: Never Used  . Alcohol Use: No  . Drug Use: No  . Sexual Activity: Not Currently   Other Topics Concern  . None   Social History Narrative   Past Surgical History  Procedure Laterality Date  . Pars plana vitrectomy      right eye  . Posterior capsulectomy      right eye  . Pars plana vitrectomy w/ endophotocoagulation      right eye  . Fiberoptic bronch with endobronchial u/s  07/02/2010  . Video bronchoscopy  12/09/2009  . 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 Bronch with endobronchial biopies  09/15/2009  . Colonoscopy N/A 09/13/2013    Procedure: COLONOSCOPY;  Surgeon: Jeryl Columbia, MD;  Location: [facility];  Service: Endoscopy;  Laterality: N/A;   Past Medical History  Diagnosis Date  . Diabetes mellitus   . Hypertension   . Coronary artery disease   . Aortic insufficiency   . Hyperlipidemia   . Aortic aneurysm   . Hypercholesterolemia   . COPD (chronic obstructive pulmonary disease)   . Bronchitis   . Edema   . GERD (gastroesophageal reflux disease)   .  Aortic valve disease     stable  . Pain in joint, forearm   . Pain in joint, hand   . Pain in joint, lower leg   . Difficulty in walking(719.7)   . Disturbance of skin sensation   . Cancer    BP 140/74 mmHg  Pulse 80  Resp 14  SpO2 92%  Opioid Risk Score:   Fall Risk Score:  `1  Depression screen PHQ 2/9  Depression screen PHQ 2/9 01/07/2015  Decreased Interest 0  Down, Depressed, Hopeless 0  PHQ - 2 Score 0  Altered sleeping 3  Tired, decreased energy 2  Change in appetite 0  Feeling bad or failure about yourself  3  Trouble concentrating 0  Moving slowly or fidgety/restless 3  Suicidal thoughts 0  PHQ-9 Score 11     Review of Systems    Constitutional:       Not sleeping due to pain  HENT: Negative.   Eyes: Negative.   Respiratory: Positive for cough.   Cardiovascular: Negative.   Gastrointestinal: Negative.   Endocrine: Negative.   Genitourinary: Negative.   Musculoskeletal: Positive for joint swelling and arthralgias.       Hip pain 10/10  Skin: Negative.   Allergic/Immunologic: Negative.   Neurological: Positive for weakness.       Trouble walking  Hematological: Negative.   Psychiatric/Behavioral: Positive for sleep disturbance.       Objective:   Physical Exam  Constitutional: She is oriented to person, place, and time. She appears well-developed and well-nourished.  HENT:  Head: Normocephalic and atraumatic.  Eyes: Conjunctivae and EOM are normal. Pupils are equal, round, and reactive to light.  Musculoskeletal:       Right hip: She exhibits decreased range of motion, decreased strength and deformity.  Flexion contracture right hip Patient does not have any internal or external rotation of the hip She has good knee extension and ankle dorsiflexion plantar flexion Ambulates with a rolling walker antalgic gait: Flexed at the hip  Neurological: She is alert and oriented to person, place, and time.  Psychiatric: She has a normal mood and affect.  Nursing note and vitals reviewed.         Assessment & Plan:  1.  End-stage Osteo arthritis of the right hip with hip contracture. The patient has failed conservative care and has surgical consultation already scheduled. In the meantime she may need to reduce her activity level.  I called in Tylenol No. 4 one tablet 3 times per day that she would need to take on a regular basis Will have IM injection of Toradol today 30 mg If her pain continues to worsen over the next couple days she may seek evaluation in the emergency department but at this point does not seem to have any red flags to indicate any type of septic arthritis. Over half of the 25 min visit  was spent counseling and coordinating care. Pt is [place] speaking, daughter translates but also has limited [place].

## 2015-02-18 NOTE — Patient Instructions (Signed)
If you feel worse. You may go to the emergency department  The Toradol injection may be repeated in a week if needed  Pick up the medicine from the pharmacy and take one tablet three times a day

## 2015-02-18 NOTE — Telephone Encounter (Signed)
Called in Tylenol #4 #90 #RF 1 - take one tablet every 8 hours as need for pain.  Called and left message for Rinna (daughter) about the prescription and canceled today's appt.  Asked for her to call me back to discuss other referral to Dr. Trevor Mace office at [place]

## 2015-02-19 ENCOUNTER — Ambulatory Visit: Payer: Medicare Other | Admitting: Obstetrics & Gynecology

## 2015-02-21 ENCOUNTER — Ambulatory Visit: Payer: Medicare Other | Admitting: Physical Medicine & Rehabilitation

## 2015-02-25 ENCOUNTER — Other Ambulatory Visit: Payer: Self-pay

## 2015-02-25 DIAGNOSIS — Z1231 Encounter for screening mammogram for malignant neoplasm of breast: Secondary | ICD-10-CM

## 2015-02-28 ENCOUNTER — Encounter: Payer: Medicare Other | Attending: Physical Medicine & Rehabilitation

## 2015-02-28 ENCOUNTER — Other Ambulatory Visit: Payer: Self-pay | Admitting: Physical Medicine & Rehabilitation

## 2015-02-28 ENCOUNTER — Encounter: Payer: Self-pay | Admitting: Physical Medicine & Rehabilitation

## 2015-02-28 ENCOUNTER — Ambulatory Visit (HOSPITAL_BASED_OUTPATIENT_CLINIC_OR_DEPARTMENT_OTHER): Payer: Medicare Other | Admitting: Physical Medicine & Rehabilitation

## 2015-02-28 VITALS — BP 141/64 | HR 76 | Resp 14

## 2015-02-28 DIAGNOSIS — Z5181 Encounter for therapeutic drug level monitoring: Secondary | ICD-10-CM | POA: Diagnosis not present

## 2015-02-28 DIAGNOSIS — Z79899 Other long term (current) drug therapy: Secondary | ICD-10-CM

## 2015-02-28 DIAGNOSIS — G894 Chronic pain syndrome: Secondary | ICD-10-CM | POA: Diagnosis not present

## 2015-02-28 DIAGNOSIS — M1611 Unilateral primary osteoarthritis, right hip: Secondary | ICD-10-CM

## 2015-02-28 DIAGNOSIS — M47816 Spondylosis without myelopathy or radiculopathy, lumbar region: Secondary | ICD-10-CM | POA: Insufficient documentation

## 2015-02-28 MED ORDER — KETOROLAC TROMETHAMINE 30 MG/ML IJ SOLN
30.0000 mg | Freq: Once | INTRAMUSCULAR | Status: AC
  Administered 2015-02-28: 30 mg via INTRAMUSCULAR

## 2015-02-28 NOTE — Patient Instructions (Signed)
See Dr Alvan Dame to get his opinion  May call for another injection if needed

## 2015-02-28 NOTE — Progress Notes (Signed)
Subjective:    Patient ID: [name], female    DOB: July 05, 1932, 81 y.o.   MRN: [id number]  HPI 81 year old female with right hip end-stage osteoarthritis, she has failed right hip intra-articular injection on 02/11/2015 but had good relief after Toradol 30 mg injection on 02/18/2015. She is here for repeat injectionAs well as reevaluation. She did get her appointment with orthopedic surgeon moved up. She will be seen next week  No falls, She is no longer needing a walker to ambulate. She is using tramadol on occasion as well as Tylenol No. 3 on occasion as well as plain Tylenol on occasion.  Pain Inventory Average Pain 10 Pain Right Now 10 My pain is constant, sharp and stabbing  In the last 24 hours, has pain interfered with the following? General activity 10 Relation with others 10 Enjoyment of life 10 What TIME of day is your pain at its worst? all Sleep (in general) Poor  Pain is worse with: walking, sitting and some activites Pain improves with: rest and injections Relief from Meds: 0  Mobility walk with assistance use a walker how many minutes can you walk? 5 ability to climb steps?  no do you drive?  no  Function retired  Neuro/Psych trouble walking depression  Prior Studies Any changes since last visit?  no  Physicians involved in your care Any changes since last visit?  no   Family History  Problem Relation Age of Onset  . Hypertension Mother   . Hypertension Father    History   Social History  . Marital Status: Married    Spouse Name: N/A  . Number of Children: N/A  . Years of Education: N/A   Social History Main Topics  . Smoking status: Never Smoker   . Smokeless tobacco: Never Used  . Alcohol Use: No  . Drug Use: No  . Sexual Activity: Not Currently   Other Topics Concern  . None   Social History Narrative   Past Surgical History  Procedure Laterality Date  . Pars plana vitrectomy      right eye  . Posterior capsulectomy       right eye  . Pars plana vitrectomy w/ endophotocoagulation      right eye  . Fiberoptic bronch with endobronchial u/s  07/02/2010  . Video bronchoscopy  12/09/2009  . 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 Bronch with endobronchial biopies  09/15/2009  . Colonoscopy N/A 09/13/2013    Procedure: COLONOSCOPY;  Surgeon: Jeryl Columbia, MD;  Location: [facility];  Service: Endoscopy;  Laterality: N/A;   Past Medical History  Diagnosis Date  . Diabetes mellitus   . Hypertension   . Coronary artery disease   . Aortic insufficiency   . Hyperlipidemia   . Aortic aneurysm   . Hypercholesterolemia   . COPD (chronic obstructive pulmonary disease)   . Bronchitis   . Edema   . GERD (gastroesophageal reflux disease)   . Aortic valve disease     stable  . Pain in joint, forearm   . Pain in joint, hand   . Pain in joint, lower leg   . Difficulty in walking(719.7)   . Disturbance of skin sensation   . Cancer    BP 141/64 mmHg  Pulse 76  Resp 14  SpO2 94%  Opioid Risk Score:   Fall Risk Score: Moderate Fall Risk (6-13 points)`1  Depression screen PHQ 2/9  Depression screen [facility] 2/9  01/07/2015  Decreased Interest 0  Down, Depressed, Hopeless 0  PHQ - 2 Score 0  Altered sleeping 3  Tired, decreased energy 2  Change in appetite 0  Feeling bad or failure about yourself  3  Trouble concentrating 0  Moving slowly or fidgety/restless 3  Suicidal thoughts 0  PHQ-9 Score 11     Review of Systems  Musculoskeletal: Positive for gait problem.  Psychiatric/Behavioral: Positive for dysphoric mood.       Objective:   Physical Exam   Gen. No acute distress Mood and affect are appropriate Right hip has no tenderness palpation She has 5/5 right knee extension and ankle dorsiflexor left knee extension left ankle dorsiflexion 4 minus at the right hip and 5 at the left hip Negative straight leg raising Patient has essentially 0  internal/external rotation of the hip     Assessment & Plan:  1. End-stage osteoarthritis of the right hip. Symptomatically she is doing better she still has a severe contracture she still has gait deviations with forward flexion at the hip. I recommended that she keep her appointment with orthopedic surgery for evaluation.  While her pain is better certainly her range of motion has not improved. We'll repeat the Toradol injection 30 mg today continue her analgesics as noted above I'll let her follow-up is orthopedics and she'll call back to make an appointment with me as needed.

## 2015-02-28 NOTE — Addendum Note (Signed)
Addended by: Geryl Rankins D on: 02/28/2015 02:55 PM   Modules accepted: Orders

## 2015-03-01 LAB — PMP ALCOHOL METABOLITE (ETG): Ethyl Glucuronide (EtG): NEGATIVE ng/mL

## 2015-03-03 ENCOUNTER — Ambulatory Visit: Payer: Medicare Other | Admitting: Physical Medicine & Rehabilitation

## 2015-03-04 LAB — OPIATES/OPIOIDS (LC/MS-MS)
CODEINE URINE: 1934 ng/mL — AB (ref <50)
HYDROCODONE: NEGATIVE ng/mL (ref <50)
HYDROMORPHONE: NEGATIVE ng/mL (ref <50)
MORPHINE: 404 ng/mL — AB (ref <50)
NOROXYCODONE, UR: NEGATIVE ng/mL (ref <50)
Norhydrocodone, Ur: 82 ng/mL — AB (ref <50)
Oxycodone, ur: NEGATIVE ng/mL (ref <50)
Oxymorphone: NEGATIVE ng/mL (ref <50)

## 2015-03-05 LAB — PRESCRIPTION MONITORING PROFILE (SOLSTAS)
AMPHETAMINE/METH: NEGATIVE ng/mL
BARBITURATE SCREEN, URINE: NEGATIVE ng/mL
BENZODIAZEPINE SCREEN, URINE: NEGATIVE ng/mL
BUPRENORPHINE, URINE: NEGATIVE ng/mL
CANNABINOID SCRN UR: NEGATIVE ng/mL
Carisoprodol, Urine: NEGATIVE ng/mL
Cocaine Metabolites: NEGATIVE ng/mL
Creatinine, Urine: 62.35 mg/dL (ref >20.0)
Fentanyl, Ur: NEGATIVE ng/mL
MDMA URINE: NEGATIVE ng/mL
Meperidine, Ur: NEGATIVE ng/mL
Methadone Screen, Urine: NEGATIVE ng/mL
Nitrites, Initial: NEGATIVE ug/mL
OXYCODONE SCRN UR: NEGATIVE ng/mL
Propoxyphene: NEGATIVE ng/mL
TAPENTADOLUR: NEGATIVE ng/mL
Tramadol Scrn, Ur: NEGATIVE ng/mL
Zolpidem, Urine: NEGATIVE ng/mL
pH, Initial: 7.1 pH (ref 4.5–8.9)

## 2015-03-07 ENCOUNTER — Ambulatory Visit: Payer: Medicare Other

## 2015-03-08 ENCOUNTER — Other Ambulatory Visit: Payer: Self-pay | Admitting: Cardiovascular Disease

## 2015-03-10 ENCOUNTER — Ambulatory Visit
Admission: RE | Admit: 2015-03-10 | Discharge: 2015-03-10 | Disposition: A | Discharge status: Home / Self Care | Payer: Medicare Other | Source: Ambulatory Visit

## 2015-03-10 DIAGNOSIS — Z1231 Encounter for screening mammogram for malignant neoplasm of breast: Secondary | ICD-10-CM

## 2015-03-12 ENCOUNTER — Other Ambulatory Visit: Payer: Self-pay | Admitting: Obstetrics & Gynecology

## 2015-03-12 DIAGNOSIS — R928 Other abnormal and inconclusive findings on diagnostic imaging of breast: Secondary | ICD-10-CM

## 2015-03-14 ENCOUNTER — Ambulatory Visit
Admission: RE | Admit: 2015-03-14 | Discharge: 2015-03-14 | Disposition: A | Discharge status: Home / Self Care | Payer: Medicare Other | Source: Ambulatory Visit | Attending: Obstetrics & Gynecology | Admitting: Obstetrics & Gynecology

## 2015-03-14 DIAGNOSIS — R928 Other abnormal and inconclusive findings on diagnostic imaging of breast: Secondary | ICD-10-CM

## 2015-03-18 ENCOUNTER — Other Ambulatory Visit: Payer: Self-pay | Admitting: Physical Medicine & Rehabilitation

## 2015-04-04 ENCOUNTER — Ambulatory Visit: Payer: Medicare Other | Admitting: Physical Medicine & Rehabilitation

## 2015-04-08 ENCOUNTER — Ambulatory Visit (INDEPENDENT_AMBULATORY_CARE_PROVIDER_SITE_OTHER): Payer: Medicare Other | Admitting: Thoracic Surgery (Cardiothoracic Vascular Surgery)

## 2015-04-08 ENCOUNTER — Ambulatory Visit
Admission: RE | Admit: 2015-04-08 | Discharge: 2015-04-08 | Disposition: A | Discharge status: Home / Self Care | Payer: Medicare Other | Source: Ambulatory Visit | Attending: Thoracic Surgery (Cardiothoracic Vascular Surgery) | Admitting: Thoracic Surgery (Cardiothoracic Vascular Surgery)

## 2015-04-08 ENCOUNTER — Encounter: Payer: Self-pay | Admitting: Thoracic Surgery (Cardiothoracic Vascular Surgery)

## 2015-04-08 VITALS — BP 163/78 | HR 72 | Resp 16 | Ht 59.0 in | Wt 178.0 lb

## 2015-04-08 DIAGNOSIS — R918 Other nonspecific abnormal finding of lung field: Secondary | ICD-10-CM | POA: Diagnosis not present

## 2015-04-08 DIAGNOSIS — D3A00 Benign carcinoid tumor of unspecified site: Secondary | ICD-10-CM

## 2015-04-08 DIAGNOSIS — R911 Solitary pulmonary nodule: Secondary | ICD-10-CM

## 2015-04-08 DIAGNOSIS — I712 Thoracic aortic aneurysm, without rupture, unspecified: Secondary | ICD-10-CM

## 2015-04-08 NOTE — Progress Notes (Signed)
[address]       [place] [address]             [phone]       HPI:  Mrs. Rivenbark turns today for a scheduled follow-up visit regarding her ascending aortic aneurysm.  She is an 81 year old woman with history of morbid obesity, diabetes, hypercholesterolemia, hypertension, coronary artery disease, aortic insufficiency, bronchitis, COPD, resection of a carcinoid tumor, and multiple carcinoid syndrome. She's been followed for several years for an ascending aortic aneurysm. At her last visit that measured 5.3 cm in diameter at the level of pulmonary artery. It does measure smaller when taken perpendicular to the axis of flow, but the reporting is consistent.  She recently was having a lot of difficulty with right hip pain. She saw Dr. Alvan Dame recently and had an injection. That has helped her tremendously and she is able to walk now.  She continues to have shortness of breath with exertion. She says that has not really changed over the past year. He still has problems with swelling in her legs, she is on furosemide for that. She asks whether she should have some varicose veins treated.  Her daughter accompanies her and provides translation  Past Medical History  Diagnosis Date  . Diabetes mellitus   . Hypertension   . Coronary artery disease   . Aortic insufficiency   . Hyperlipidemia   . Aortic aneurysm   . Hypercholesterolemia   . COPD (chronic obstructive pulmonary disease)   . Bronchitis   . Edema   . GERD (gastroesophageal reflux disease)   . Aortic valve disease     stable  . Pain in joint, forearm   . Pain in joint, hand   . Pain in joint, lower leg   . Difficulty in walking(719.7)   . Disturbance of skin sensation   . Cancer    Past Surgical History  Procedure Laterality Date  . Pars plana vitrectomy      right eye  . Posterior capsulectomy      right eye  . Pars plana vitrectomy w/ endophotocoagulation      right eye  . Fiberoptic  bronch with endobronchial u/s  07/02/2010  . Video bronchoscopy  12/09/2009  . 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 Bronch with endobronchial biopies  09/15/2009  . Colonoscopy N/A 09/13/2013    Procedure: COLONOSCOPY;  Surgeon: Jeryl Columbia, MD;  Location: [facility];  Service: Endoscopy;  Laterality: N/A;      Current Outpatient Prescriptions  Medication Sig Dispense Refill  . ACCU-CHEK AVIVA PLUS test strip   5  . ACCU-CHEK SOFTCLIX LANCETS lancets   5  . acetaminophen (TYLENOL) 325 MG tablet Take 325 mg by mouth every 6 (six) hours as needed for pain. For pain    . acetaminophen-codeine (TYLENOL #4) 300-60 MG per tablet Take 1 tablet by mouth every 8 (eight) hours as needed for moderate pain. 90 tablet 1  . ADVAIR HFA 230-21 MCG/ACT inhaler Inhale 2 puffs into the lungs 2 (two) times daily.     Marland Kitchen aspirin 81 MG tablet Take 81 mg by mouth daily.     . diclofenac sodium (VOLTAREN) 1 % GEL APPLY 2 GRAMS TOPICALLY 4 (FOUR) TIMES DAILY. 300 g 1  . FeFum-FePoly-FA-B Cmp-C-Biot (INTEGRA PLUS) CAPS TAKE ONE CAPSULE BY MOUTH EVERY DAY *NOT COVERED* 30 capsule 5  . furosemide (LASIX) 20 MG tablet Take 20  mg by mouth daily as needed for fluid.    Marland Kitchen glimepiride (AMARYL) 1 MG tablet Take 1 mg by mouth daily.     . indapamide (LOZOL) 2.5 MG tablet TAKE 1 TABLET (2.5 MG TOTAL) BY MOUTH EVERY MORNING. 30 tablet 2  . Multiple Vitamin (MULITIVITAMIN WITH MINERALS) TABS Take 1 tablet by mouth daily.    Marland Kitchen NIFEdipine (PROCARDIA XL/ADALAT-CC) 60 MG 24 hr tablet Take 1 tablet (60 mg total) by mouth daily. 30 tablet 3  . NITROSTAT 0.4 MG SL tablet PLACE 1 TABLET UNDER THE TONGUE EVERY 5 MINUTES AS NEEDED FOR CHEST PAIN 25 tablet 2  . olmesartan (BENICAR) 40 MG tablet Take 1 tablet (40 mg total) by mouth 2 (two) times daily. 60 tablet 10  . pioglitazone (ACTOS) 15 MG tablet Take 15 mg by mouth daily.    . potassium chloride (K-DUR,KLOR-CON) 10 MEQ tablet Take 20  mEq by mouth daily.    Marland Kitchen PROAIR HFA 108 (90 BASE) MCG/ACT inhaler     . rosuvastatin (CRESTOR) 20 MG tablet Take 10 mg by mouth at bedtime as needed and may repeat dose one time if needed.     . traMADol (ULTRAM) 50 MG tablet Take 1 tablet (50 mg total) by mouth 3 (three) times daily as needed. 90 tablet 5   No current facility-administered medications for this visit.    Physical Exam BP 163/78 mmHg  Pulse 72  Resp 16  Ht '4\' 11"'$  (1.499 m)  Wt 178 lb (80.74 kg)  BMI 35.93 kg/m2  SpO2 97% Morbidly obese 81 year old woman in no acute distress Alert and oriented 3, no focal weakness 3+ carotid pulses bilaterally Cardiac regular early systolic and 2/6 diastolic murmur heard throughout precordium Lungs clear Multiple varicosities bilaterally, 1-2+ edema bilaterally  Diagnostic Tests: CT of the chest reviewed. This was done as a regular noncontrast CT. There is motion artifact. There is no apparent change in the ascending aneurysm or bilateral lung nodules.  Impression: 81 year old woman with multiple medical issues including an ascending aortic aneurysm. This has been stable over time going back to 2010.  I discussed the relevant issues with Mrs. Delmar and her daughter. The aneurysm has not changed. She still has at some risk for rupture or dissection and blood pressure control is imperative. She is a poor operative candidate and I discussed the possibility of stopping follow-up scanning. They both were confused by that suggestion and want to continue with follow-up. I will continue to discuss this with them over time. For now we will continue to follow guidelines.  Plan: Return in 6 months with CT angiogram of chest  Melrose Nakayama, MD [facility] [phone]

## 2015-05-04 ENCOUNTER — Other Ambulatory Visit: Payer: Self-pay | Admitting: Internal Medicine

## 2015-05-04 ENCOUNTER — Other Ambulatory Visit: Payer: Self-pay | Admitting: Cardiovascular Disease

## 2015-05-05 ENCOUNTER — Other Ambulatory Visit: Payer: Self-pay

## 2015-05-05 MED ORDER — NIFEDIPINE ER OSMOTIC RELEASE 60 MG PO TB24: 60.0000 mg | ORAL_TABLET | Freq: Every day | ORAL | Status: DC

## 2015-05-25 ENCOUNTER — Other Ambulatory Visit: Payer: Self-pay | Admitting: Physical Medicine & Rehabilitation

## 2015-06-04 ENCOUNTER — Other Ambulatory Visit: Payer: Self-pay | Admitting: Cardiovascular Disease

## 2015-07-07 ENCOUNTER — Emergency Department (HOSPITAL_COMMUNITY)
Admission: EM | Admit: 2015-07-07 | Discharge: 2015-07-07 | Disposition: A | Discharge status: Home / Self Care | Payer: Medicare Other | Attending: Emergency Medicine | Admitting: Emergency Medicine

## 2015-07-07 ENCOUNTER — Encounter (HOSPITAL_COMMUNITY): Payer: Self-pay | Admitting: *Deleted

## 2015-07-07 DIAGNOSIS — L97819 Non-pressure chronic ulcer of other part of right lower leg with unspecified severity: Secondary | ICD-10-CM | POA: Insufficient documentation

## 2015-07-07 DIAGNOSIS — J449 Chronic obstructive pulmonary disease, unspecified: Secondary | ICD-10-CM | POA: Insufficient documentation

## 2015-07-07 DIAGNOSIS — Z859 Personal history of malignant neoplasm, unspecified: Secondary | ICD-10-CM | POA: Insufficient documentation

## 2015-07-07 DIAGNOSIS — Z794 Long term (current) use of insulin: Secondary | ICD-10-CM | POA: Diagnosis not present

## 2015-07-07 DIAGNOSIS — E780 Pure hypercholesterolemia: Secondary | ICD-10-CM | POA: Insufficient documentation

## 2015-07-07 DIAGNOSIS — E11622 Type 2 diabetes mellitus with other skin ulcer: Secondary | ICD-10-CM | POA: Diagnosis not present

## 2015-07-07 DIAGNOSIS — I2510 Atherosclerotic heart disease of native coronary artery without angina pectoris: Secondary | ICD-10-CM | POA: Diagnosis not present

## 2015-07-07 DIAGNOSIS — Z79899 Other long term (current) drug therapy: Secondary | ICD-10-CM | POA: Insufficient documentation

## 2015-07-07 DIAGNOSIS — M79661 Pain in right lower leg: Secondary | ICD-10-CM | POA: Diagnosis present

## 2015-07-07 DIAGNOSIS — Z7982 Long term (current) use of aspirin: Secondary | ICD-10-CM | POA: Insufficient documentation

## 2015-07-07 DIAGNOSIS — E785 Hyperlipidemia, unspecified: Secondary | ICD-10-CM | POA: Insufficient documentation

## 2015-07-07 DIAGNOSIS — L97909 Non-pressure chronic ulcer of unspecified part of unspecified lower leg with unspecified severity: Secondary | ICD-10-CM

## 2015-07-07 DIAGNOSIS — I10 Essential (primary) hypertension: Secondary | ICD-10-CM | POA: Insufficient documentation

## 2015-07-07 DIAGNOSIS — Z8719 Personal history of other diseases of the digestive system: Secondary | ICD-10-CM | POA: Diagnosis not present

## 2015-07-07 LAB — BASIC METABOLIC PANEL
Anion gap: 8 (ref 5–15)
BUN: 31 mg/dL — ABNORMAL HIGH (ref 6–20)
CO2: 31 mmol/L (ref 22–32)
Calcium: 9.1 mg/dL (ref 8.9–10.3)
Chloride: 99 mmol/L — ABNORMAL LOW (ref 101–111)
Creatinine, Ser: 0.86 mg/dL (ref 0.44–1.00)
GFR calc Af Amer: >60 mL/min (ref >60)
GFR calc non Af Amer: >60 mL/min (ref >60)
Glucose, Bld: 169 mg/dL — ABNORMAL HIGH (ref 65–99)
Potassium: 3.5 mmol/L (ref 3.5–5.1)
Sodium: 138 mmol/L (ref 135–145)

## 2015-07-07 LAB — CBC WITH DIFFERENTIAL/PLATELET
Basophils Absolute: 0 10*3/uL (ref 0.0–0.1)
Basophils Relative: 0 % (ref 0–1)
Eosinophils Absolute: 0.1 10*3/uL (ref 0.0–0.7)
Eosinophils Relative: 1 % (ref 0–5)
HCT: 37.1 % (ref 36.0–46.0)
Hemoglobin: 11.7 g/dL — ABNORMAL LOW (ref 12.0–15.0)
Lymphocytes Relative: 21 % (ref 12–46)
Lymphs Abs: 1.4 10*3/uL (ref 0.7–4.0)
MCH: 29.7 pg (ref 26.0–34.0)
MCHC: 31.5 g/dL (ref 30.0–36.0)
MCV: 94.2 fL (ref 78.0–100.0)
Monocytes Absolute: 0.6 10*3/uL (ref 0.1–1.0)
Monocytes Relative: 9 % (ref 3–12)
Neutro Abs: 4.8 10*3/uL (ref 1.7–7.7)
Neutrophils Relative %: 69 % (ref 43–77)
Platelets: 242 10*3/uL (ref 150–400)
RBC: 3.94 MIL/uL (ref 3.87–5.11)
RDW: 13.4 % (ref 11.5–15.5)
WBC: 7 10*3/uL (ref 4.0–10.5)

## 2015-07-07 MED ORDER — AMOXICILLIN-POT CLAVULANATE 875-125 MG PO TABS: 1.0000 | ORAL_TABLET | Freq: Two times a day (BID) | ORAL | Status: DC

## 2015-07-07 MED ORDER — SULFAMETHOXAZOLE-TRIMETHOPRIM 800-160 MG PO TABS: 2.0000 | ORAL_TABLET | Freq: Two times a day (BID) | ORAL | Status: AC

## 2015-07-07 NOTE — ED Provider Notes (Signed)
Medical screening examination/treatment/procedure(s) were conducted as a shared visit with non-physician practitioner(s) and myself.  I personally evaluated the patient during the encounter.   EKG Interpretation None      Results for orders placed or performed during the hospital encounter of 07/07/15  CBC with Differential  Result Value Ref Range   WBC 7.0 4.0 - 10.5 K/uL   RBC 3.94 3.87 - 5.11 MIL/uL   Hemoglobin 11.7 (L) 12.0 - 15.0 g/dL   HCT 37.1 36.0 - 46.0 %   MCV 94.2 78.0 - 100.0 fL   MCH 29.7 26.0 - 34.0 pg   MCHC 31.5 30.0 - 36.0 g/dL   RDW 13.4 11.5 - 15.5 %   Platelets 242 150 - 400 K/uL   Neutrophils Relative % 69 43 - 77 %   Neutro Abs 4.8 1.7 - 7.7 K/uL   Lymphocytes Relative 21 12 - 46 %   Lymphs Abs 1.4 0.7 - 4.0 K/uL   Monocytes Relative 9 3 - 12 %   Monocytes Absolute 0.6 0.1 - 1.0 K/uL   Eosinophils Relative 1 0 - 5 %   Eosinophils Absolute 0.1 0.0 - 0.7 K/uL   Basophils Relative 0 0 - 1 %   Basophils Absolute 0.0 0.0 - 0.1 K/uL  Basic metabolic panel  Result Value Ref Range   Sodium 138 135 - 145 mmol/L   Potassium 3.5 3.5 - 5.1 mmol/L   Chloride 99 (L) 101 - 111 mmol/L   CO2 31 22 - 32 mmol/L   Glucose, Bld 169 (H) 65 - 99 mg/dL   BUN 31 (H) 6 - 20 mg/dL   Creatinine, Ser 0.86 0.44 - 1.00 mg/dL   Calcium 9.1 8.9 - 10.3 mg/dL   GFR calc non Af Amer >60 >60 mL/min   GFR calc Af Amer >60 >60 mL/min   Anion gap 8 5 - 15   No results found.  Patient seen by me. Patient is a diabetic has a right lower leg large as ulcer that is dry. Slight surrounding erythema. Measures approximately 4 cm. This is been present for several months. We consult to social services made arrangements for wound care follow-up. Patient without the labs warranting admission. Wound care follow-up is appropriate.  Fredia Sorrow, MD 07/07/15 2224

## 2015-07-07 NOTE — ED Notes (Signed)
Pt and family member reports ongoing leg swelling and pain x 3 months, has been seen at pcp. Swelling is more severe in right leg, has cracking of skin and drainage. Pt has been seen by pcp and given multiple prescriptions for creams to apply to her legs but no relief and unable to sleep.

## 2015-07-07 NOTE — Care Management (Signed)
ED CM consulted concerning follow up care at the [facility]. ED CM met with patient and daughter regarding recommendation for wound clinic consult, patient is agreeable. Verified contact information. Consult called to Shanon Brow at [facility] [phone], patient also instructed to contact the office tomorrow regarding follow up appointment, patient verbalized understanding teach back performed. Updated C. Lawyer PA-C.  No additional questions or concerns voiced by patient or family. No further ED CM needs identified.

## 2015-07-07 NOTE — ED Provider Notes (Signed)
CSN: [id number]     Arrival date & time 07/07/15  1539 History   First MD Initiated Contact with Patient 07/07/15 2041     Chief Complaint  Patient presents with  . Leg Pain     (Consider location/radiation/quality/duration/timing/severity/associated sxs/prior Treatment) HPI Patient presents to the emergency department with a nonhealing wound to the right lower anterior leg.  The patient states that this wound is been there for 3 months or more in her doctor several times and given cream without resolution of the symptoms.  Patient states that she has had no fever, nausea, vomiting, weakness, dizziness, headache, blurred vision, back pain, neck pain, chest pain, shortness of breath, abdominal pain, or syncope.  The patient states that nothing seems make her condition better or worse.  She does have pain around the wound Past Medical History  Diagnosis Date  . Diabetes mellitus   . Hypertension   . Coronary artery disease   . Aortic insufficiency   . Hyperlipidemia   . Aortic aneurysm   . Hypercholesterolemia   . COPD (chronic obstructive pulmonary disease)   . Bronchitis   . Edema   . GERD (gastroesophageal reflux disease)   . Aortic valve disease     stable  . Pain in joint, forearm   . Pain in joint, hand   . Pain in joint, lower leg   . Difficulty in walking(719.7)   . Disturbance of skin sensation   . Cancer    Past Surgical History  Procedure Laterality Date  . Pars plana vitrectomy      right eye  . Posterior capsulectomy      right eye  . Pars plana vitrectomy w/ endophotocoagulation      right eye  . Fiberoptic bronch with endobronchial u/s  07/02/2010  . Video bronchoscopy  12/09/2009  . 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 Bronch with endobronchial biopies  09/15/2009  . Colonoscopy N/A 09/13/2013    Procedure: COLONOSCOPY;  Surgeon: Jeryl Columbia, MD;  Location: [facility];  Service: Endoscopy;  Laterality: N/A;     Family History  Problem Relation Age of Onset  . Hypertension Mother   . Hypertension Father    Social History  Substance Use Topics  . Smoking status: Never Smoker   . Smokeless tobacco: Never Used  . Alcohol Use: No   OB History    Gravida Para Term Preterm AB TAB SAB Ectopic Multiple Living   '2 1 1  1  1   1     '$ Review of Systems  All other systems negative except as documented in the HPI. All pertinent positives and negatives as reviewed in the HPI.  Allergies  Review of patient's allergies indicates no known allergies.  Home Medications   Prior to Admission medications   Medication Sig Start Date End Date Taking? Authorizing Provider  ACCU-CHEK AVIVA PLUS test strip  08/22/14   Historical Provider, MD  ACCU-CHEK SOFTCLIX LANCETS lancets  09/18/14   Historical Provider, MD  acetaminophen (TYLENOL) 325 MG tablet Take 325 mg by mouth every 6 (six) hours as needed for pain. For pain    Historical Provider, MD  acetaminophen-codeine (TYLENOL #4) 300-60 MG per tablet Take 1 tablet by mouth every 8 (eight) hours as needed for moderate pain. 02/18/15   Charlett Blake, MD  ADVAIR [facility] 230-21 MCG/ACT inhaler Inhale 2 puffs into the lungs 2 (two) times daily.  11/19/12   Historical Provider,  MD  aspirin 81 MG tablet Take 81 mg by mouth daily.     Historical Provider, MD  BENICAR 40 MG tablet TAKE 1 TABLET (40 MG TOTAL) BY MOUTH 2 (TWO) TIMES DAILY. 06/04/15   Josue Hector, MD  diclofenac sodium (VOLTAREN) 1 % GEL APPLY 2 GRAMS TOPICALLY 4 (FOUR) TIMES DAILY. 05/26/15   Charlett Blake, MD  FeFum-FePoly-FA-B Cmp-C-Biot (INTEGRA PLUS) CAPS TAKE ONE CAPSULE BY MOUTH EVERY DAY *NOT COVERED* 05/05/15   Curt Bears, MD  furosemide (LASIX) 20 MG tablet Take 20 mg by mouth daily as needed for fluid.    Historical Provider, MD  glimepiride (AMARYL) 1 MG tablet Take 1 mg by mouth daily.     Historical Provider, MD  indapamide (LOZOL) 2.5 MG tablet TAKE 1 TABLET (2.5 MG TOTAL) BY MOUTH  EVERY MORNING. 06/04/15   Josue Hector, MD  Multiple Vitamin (MULITIVITAMIN WITH MINERALS) TABS Take 1 tablet by mouth daily.    Historical Provider, MD  NIFEdipine (PROCARDIA XL/ADALAT-CC) 60 MG 24 hr tablet Take 1 tablet (60 mg total) by mouth daily. 05/05/15   Josue Hector, MD  NITROSTAT 0.4 MG SL tablet PLACE 1 TABLET UNDER THE TONGUE EVERY 5 MINUTES AS NEEDED FOR CHEST PAIN 09/25/14   Josue Hector, MD  pioglitazone (ACTOS) 15 MG tablet Take 15 mg by mouth daily.    Historical Provider, MD  potassium chloride (K-DUR,KLOR-CON) 10 MEQ tablet Take 20 mEq by mouth daily.    Historical Provider, MD  PROAIR HFA 108 (90 BASE) MCG/ACT inhaler  09/11/14   Historical Provider, MD  rosuvastatin (CRESTOR) 20 MG tablet Take 10 mg by mouth at bedtime as needed and may repeat dose one time if needed.     Historical Provider, MD  traMADol (ULTRAM) 50 MG tablet Take 1 tablet (50 mg total) by mouth 3 (three) times daily as needed. 02/11/15   Charlett Blake, MD   BP 155/82 mmHg  Pulse 89  Temp(Src) 97.8 F (36.6 C) (Oral)  Resp 17  SpO2 92% Physical Exam  Constitutional: She is oriented to person, place, and time. She appears well-developed and well-nourished. No distress.  HENT:  Head: Normocephalic and atraumatic.  Mouth/Throat: Oropharynx is clear and moist.  Eyes: Pupils are equal, round, and reactive to light.  Neck: Normal range of motion. Neck supple.  Cardiovascular: Normal rate, regular rhythm and normal heart sounds.  Exam reveals no gallop and no friction rub.   No murmur heard. Pulmonary/Chest: Effort normal and breath sounds normal. No respiratory distress.  Abdominal: Soft. Bowel sounds are normal. She exhibits no distension.  Musculoskeletal:       Legs: Neurological: She is alert and oriented to person, place, and time. She exhibits normal muscle tone. Coordination normal.  Skin: Skin is warm and dry. No rash noted. No erythema.  Nursing note and vitals reviewed.   ED Course    Procedures (including critical care time) Labs Review Labs Reviewed  CBC WITH DIFFERENTIAL/PLATELET - Abnormal; Notable for the following:    Hemoglobin 11.7 (*)    All other components within normal limits  BASIC METABOLIC PANEL - Abnormal; Notable for the following:    Chloride 99 (*)    Glucose, Bld 169 (*)    BUN 31 (*)    All other components within normal limits    Imaging Review No results found. I have personally reviewed and evaluated these images and lab results as part of my medical decision-making.   I  spoke with the case manager who will help set up wound care follow-up at the [facility] long wound care center.  Patient agrees the plan and all questions were answered.  The patient was started on antibiotics   Dalia Heading, PA-C 07/12/15 0110

## 2015-07-07 NOTE — Discharge Instructions (Signed)
Return here as needed. Call the wound center provided.

## 2015-07-07 NOTE — ED Notes (Signed)
DSD applied to lower leg

## 2015-07-07 NOTE — ED Notes (Signed)
Case Manager Mariann Laster) in to talk with pt's daughter.

## 2015-07-07 NOTE — ED Notes (Signed)
Pt to ED with c/o swelling to right lower leg.  Pt is diabetic and has a lg sore to lower leg.  Has been using creams without results.

## 2015-07-08 ENCOUNTER — Other Ambulatory Visit: Payer: Self-pay | Admitting: Physical Medicine & Rehabilitation

## 2015-07-12 ENCOUNTER — Emergency Department (HOSPITAL_COMMUNITY): Payer: Medicare Other

## 2015-07-12 ENCOUNTER — Inpatient Hospital Stay (HOSPITAL_COMMUNITY)
Admission: EM | Admit: 2015-07-12 | Discharge: 2015-07-17 | DRG: 300 | Disposition: A | Discharge status: Home Health | Payer: Medicare Other | Attending: Internal Medicine | Admitting: Internal Medicine

## 2015-07-12 ENCOUNTER — Encounter (HOSPITAL_COMMUNITY): Payer: Self-pay | Admitting: Emergency Medicine

## 2015-07-12 DIAGNOSIS — L97912 Non-pressure chronic ulcer of unspecified part of right lower leg with fat layer exposed: Secondary | ICD-10-CM

## 2015-07-12 DIAGNOSIS — L97209 Non-pressure chronic ulcer of unspecified calf with unspecified severity: Secondary | ICD-10-CM | POA: Diagnosis present

## 2015-07-12 DIAGNOSIS — L97212 Non-pressure chronic ulcer of right calf with fat layer exposed: Secondary | ICD-10-CM

## 2015-07-12 DIAGNOSIS — J449 Chronic obstructive pulmonary disease, unspecified: Secondary | ICD-10-CM | POA: Diagnosis present

## 2015-07-12 DIAGNOSIS — L03115 Cellulitis of right lower limb: Secondary | ICD-10-CM

## 2015-07-12 DIAGNOSIS — E780 Pure hypercholesterolemia: Secondary | ICD-10-CM | POA: Diagnosis present

## 2015-07-12 DIAGNOSIS — Z23 Encounter for immunization: Secondary | ICD-10-CM | POA: Diagnosis not present

## 2015-07-12 DIAGNOSIS — I878 Other specified disorders of veins: Secondary | ICD-10-CM | POA: Diagnosis present

## 2015-07-12 DIAGNOSIS — E785 Hyperlipidemia, unspecified: Secondary | ICD-10-CM | POA: Diagnosis present

## 2015-07-12 DIAGNOSIS — Z79899 Other long term (current) drug therapy: Secondary | ICD-10-CM | POA: Diagnosis not present

## 2015-07-12 DIAGNOSIS — K219 Gastro-esophageal reflux disease without esophagitis: Secondary | ICD-10-CM | POA: Diagnosis present

## 2015-07-12 DIAGNOSIS — I5032 Chronic diastolic (congestive) heart failure: Secondary | ICD-10-CM | POA: Diagnosis present

## 2015-07-12 DIAGNOSIS — L97909 Non-pressure chronic ulcer of unspecified part of unspecified lower leg with unspecified severity: Secondary | ICD-10-CM

## 2015-07-12 DIAGNOSIS — I10 Essential (primary) hypertension: Secondary | ICD-10-CM | POA: Diagnosis present

## 2015-07-12 DIAGNOSIS — E860 Dehydration: Secondary | ICD-10-CM | POA: Diagnosis present

## 2015-07-12 DIAGNOSIS — E1151 Type 2 diabetes mellitus with diabetic peripheral angiopathy without gangrene: Principal | ICD-10-CM | POA: Diagnosis present

## 2015-07-12 DIAGNOSIS — I714 Abdominal aortic aneurysm, without rupture: Secondary | ICD-10-CM | POA: Diagnosis present

## 2015-07-12 DIAGNOSIS — I739 Peripheral vascular disease, unspecified: Secondary | ICD-10-CM | POA: Diagnosis not present

## 2015-07-12 DIAGNOSIS — E340 Carcinoid syndrome: Secondary | ICD-10-CM | POA: Diagnosis present

## 2015-07-12 DIAGNOSIS — I351 Nonrheumatic aortic (valve) insufficiency: Secondary | ICD-10-CM | POA: Diagnosis present

## 2015-07-12 DIAGNOSIS — S81801A Unspecified open wound, right lower leg, initial encounter: Secondary | ICD-10-CM | POA: Diagnosis not present

## 2015-07-12 DIAGNOSIS — R944 Abnormal results of kidney function studies: Secondary | ICD-10-CM | POA: Diagnosis present

## 2015-07-12 DIAGNOSIS — J42 Unspecified chronic bronchitis: Secondary | ICD-10-CM | POA: Diagnosis not present

## 2015-07-12 DIAGNOSIS — I872 Venous insufficiency (chronic) (peripheral): Secondary | ICD-10-CM | POA: Diagnosis present

## 2015-07-12 DIAGNOSIS — I2510 Atherosclerotic heart disease of native coronary artery without angina pectoris: Secondary | ICD-10-CM | POA: Diagnosis present

## 2015-07-12 DIAGNOSIS — Z6835 Body mass index (BMI) 35.0-35.9, adult: Secondary | ICD-10-CM

## 2015-07-12 DIAGNOSIS — E1165 Type 2 diabetes mellitus with hyperglycemia: Secondary | ICD-10-CM | POA: Diagnosis present

## 2015-07-12 DIAGNOSIS — Z7982 Long term (current) use of aspirin: Secondary | ICD-10-CM

## 2015-07-12 DIAGNOSIS — Z794 Long term (current) use of insulin: Secondary | ICD-10-CM

## 2015-07-12 DIAGNOSIS — L97919 Non-pressure chronic ulcer of unspecified part of right lower leg with unspecified severity: Secondary | ICD-10-CM | POA: Diagnosis not present

## 2015-07-12 DIAGNOSIS — E1159 Type 2 diabetes mellitus with other circulatory complications: Secondary | ICD-10-CM

## 2015-07-12 DIAGNOSIS — I080 Rheumatic disorders of both mitral and aortic valves: Secondary | ICD-10-CM | POA: Diagnosis present

## 2015-07-12 DIAGNOSIS — E871 Hypo-osmolality and hyponatremia: Secondary | ICD-10-CM | POA: Diagnosis present

## 2015-07-12 LAB — COMPREHENSIVE METABOLIC PANEL
ALBUMIN: 3.5 g/dL (ref 3.5–5.0)
ALT: 18 U/L (ref 14–54)
AST: 30 U/L (ref 15–41)
Alkaline Phosphatase: 79 U/L (ref 38–126)
Anion gap: 10 (ref 5–15)
BILIRUBIN TOTAL: 0.5 mg/dL (ref 0.3–1.2)
BUN: 33 mg/dL — AB (ref 6–20)
CALCIUM: 8.5 mg/dL — AB (ref 8.9–10.3)
CO2: 23 mmol/L (ref 22–32)
CREATININE: 1.18 mg/dL — AB (ref 0.44–1.00)
Chloride: 92 mmol/L — ABNORMAL LOW (ref 101–111)
GFR calc Af Amer: 49 mL/min — ABNORMAL LOW (ref >60)
GFR calc non Af Amer: 42 mL/min — ABNORMAL LOW (ref >60)
GLUCOSE: 154 mg/dL — AB (ref 65–99)
Potassium: 3.9 mmol/L (ref 3.5–5.1)
Sodium: 125 mmol/L — ABNORMAL LOW (ref 135–145)
TOTAL PROTEIN: 6.6 g/dL (ref 6.5–8.1)

## 2015-07-12 LAB — CBC WITH DIFFERENTIAL/PLATELET
BASOS ABS: 0 10*3/uL (ref 0.0–0.1)
BASOS PCT: 0 %
EOS ABS: 0 10*3/uL (ref 0.0–0.7)
EOS PCT: 1 %
HCT: 32.1 % — ABNORMAL LOW (ref 36.0–46.0)
Hemoglobin: 10.6 g/dL — ABNORMAL LOW (ref 12.0–15.0)
Lymphocytes Relative: 15 %
Lymphs Abs: 0.8 10*3/uL (ref 0.7–4.0)
MCH: 30 pg (ref 26.0–34.0)
MCHC: 33 g/dL (ref 30.0–36.0)
MCV: 90.9 fL (ref 78.0–100.0)
MONO ABS: 0.5 10*3/uL (ref 0.1–1.0)
Monocytes Relative: 10 %
Neutro Abs: 4.2 10*3/uL (ref 1.7–7.7)
Neutrophils Relative %: 74 %
PLATELETS: 229 10*3/uL (ref 150–400)
RBC: 3.53 MIL/uL — ABNORMAL LOW (ref 3.87–5.11)
RDW: 13.4 % (ref 11.5–15.5)
WBC: 5.6 10*3/uL (ref 4.0–10.5)

## 2015-07-12 MED ORDER — OXYCODONE-ACETAMINOPHEN 5-325 MG PO TABS
1.0000 | ORAL_TABLET | Freq: Once | ORAL | Status: AC
  Administered 2015-07-12: 1 via ORAL
  Filled 2015-07-12: qty 1

## 2015-07-12 MED ORDER — VANCOMYCIN HCL IN DEXTROSE 1-5 GM/200ML-% IV SOLN
1000.0000 mg | Freq: Once | INTRAVENOUS | Status: AC
Start: 2015-07-12 — End: 2015-07-12
  Administered 2015-07-12: 1000 mg via INTRAVENOUS
  Filled 2015-07-12: qty 200

## 2015-07-12 MED ORDER — DEXTROSE 5 % IV SOLN
1.0000 g | Freq: Once | INTRAVENOUS | Status: AC
  Administered 2015-07-12: 1 g via INTRAVENOUS
  Filled 2015-07-12: qty 10

## 2015-07-12 NOTE — ED Notes (Signed)
Pt c/o pain to right lower leg reports onset since the 12th. Pt has a kling wrap dressing to bottom of right leg. Pt will not let this nurse remove bandage.

## 2015-07-12 NOTE — H&P (Signed)
[facility] History and Physical  [place] DOST TLX:[id number] DOB: April 04, 1932 DOA: 07/12/2015  Referring physician:  Charlesetta Shanks PCP:  Pcp Not In System   Chief Complaint:  Right leg pain and swelling  History obtained with [place] interpreter through [facility]  HPI:  The patient is a 81 y.o. year-old female with history of morbid obesity, coronary artery disease, aortic insufficiency, abdominal aortic aneurysm, diabetes mellitus type 2, hypertension, hyperlipidemia, COPD, history of carcinoid syndrome status post resection of carcinoid tumor who presents with nonhealing right lower extremity ulceration with progressive pain and swelling.  The patient was last at their baseline health until about a month and a half ago. She states that she injured her right ankle on a dresser drawer about a month and half ago causing a large ulceration deformed. Since that time, she has had increased redness and swelling and pain.  Over the last few days she has had increased pinkness of her leg, bloody purulent discharge from the ulcer, and increased pain. She has been on Bactrim and Augmentin which have not helped. She came to the emergency department tonight because of worsening pain in the area of the ulcer. She denies fevers, chills, nausea, vomiting, diarrhea. She was recently seen in the urgent care and set up for a wound care center appointment which is on Monday 9/19. She states that she has been taking Lasix for her lower extremity swelling which does not work as well as it used to. She denies lightheadedness, dizziness.  In the emergency department, her vital signs were stable, labs notable for normal white blood cell count of 5.6, hemoglobin 10.6 with baseline of around 11-12, sodium 125 which is new, creatinine 1.2 with baseline of 0.8, mild hyperglycemia. X-ray demonstrated a skin ulcer over the anterior distal leg without acute osseous abnormality or evidence of subcutaneous air.  She was given a  dose of ceftriaxone, vancomycin, and Percocet and is being admitted for treatment of her ulceration and associated cellulitis.  Review of Systems:  General:  Denies fevers, chills, weight loss or gain HEENT:  Denies changes to hearing and vision, rhinorrhea, sinus congestion, sore throat CV:  Denies chest pain and palpitations, chronic worsening lower extremity edema.  PULM:  Denies SOB, wheezing, cough.   GI:  Denies nausea, vomiting, constipation, diarrhea.   GU:  Denies dysuria, frequency, urgency ENDO:  Denies polyuria, polydipsia.   HEME:  Denies hematemesis, blood in stools, melena, abnormal bruising or bleeding.  LYMPH:  Denies lymphadenopathy.   MSK:  Chronic arthralgias particularly in the right hip, myalgias.   DERM:  Per history of present illness  NEURO:  Denies focal numbness, weakness, slurred speech, confusion, facial droop.  PSYCH:  Denies anxiety and depression.    Past Medical History  Diagnosis Date  . Diabetes mellitus   . Hypertension   . Coronary artery disease   . Aortic insufficiency   . Hyperlipidemia   . Aortic aneurysm   . Hypercholesterolemia   . COPD (chronic obstructive pulmonary disease)   . Bronchitis   . Edema   . GERD (gastroesophageal reflux disease)   . Aortic valve disease     stable  . Pain in joint, forearm   . Pain in joint, hand   . Pain in joint, lower leg   . Difficulty in walking(719.7)   . Disturbance of skin sensation   . Cancer    Past Surgical History  Procedure Laterality Date  . Pars plana vitrectomy  right eye  . Posterior capsulectomy      right eye  . Pars plana vitrectomy w/ endophotocoagulation      right eye  . Fiberoptic bronch with endobronchial u/s  07/02/2010  . Video bronchoscopy  12/09/2009  . 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 Bronch with endobronchial biopies  09/15/2009  . Colonoscopy N/A 09/13/2013    Procedure: COLONOSCOPY;  Surgeon: Jeryl Columbia,  MD;  Location: [facility];  Service: Endoscopy;  Laterality: N/A;   Social History:  reports that she has never smoked. She has never used smokeless tobacco. She reports that she does not drink alcohol or use illicit drugs.   No Known Allergies  Family History  Problem Relation Age of Onset  . Hypertension Mother   . Hypertension Father      Prior to Admission medications   Medication Sig Start Date End Date Taking? Authorizing Provider  ACCU-CHEK AVIVA PLUS test strip  08/22/14   Historical Provider, MD  ACCU-CHEK SOFTCLIX LANCETS lancets  09/18/14   Historical Provider, MD  acetaminophen (TYLENOL) 325 MG tablet Take 325 mg by mouth every 6 (six) hours as needed for pain. For pain    Historical Provider, MD  acetaminophen-codeine (TYLENOL #4) 300-60 MG per tablet Take 1 tablet by mouth every 8 (eight) hours as needed for moderate pain. 02/18/15   Charlett Blake, MD  ADVAIR [facility] 230-21 MCG/ACT inhaler Inhale 2 puffs into the lungs 2 (two) times daily.  11/19/12   Historical Provider, MD  amoxicillin-clavulanate (AUGMENTIN) 875-125 MG per tablet Take 1 tablet by mouth every 12 (twelve) hours. 07/07/15   Dalia Heading, PA-C  aspirin 81 MG tablet Take 81 mg by mouth daily.     Historical Provider, MD  BENICAR 40 MG tablet TAKE 1 TABLET (40 MG TOTAL) BY MOUTH 2 (TWO) TIMES DAILY. 06/04/15   Josue Hector, MD  diclofenac sodium (VOLTAREN) 1 % GEL APPLY 2 GRAMS TOPICALLY 4 (FOUR) TIMES DAILY. 07/09/15   Charlett Blake, MD  FeFum-FePoly-FA-B Cmp-C-Biot (INTEGRA PLUS) CAPS TAKE ONE CAPSULE BY MOUTH EVERY DAY *NOT COVERED* 05/05/15   Curt Bears, MD  furosemide (LASIX) 20 MG tablet Take 20 mg by mouth daily as needed for fluid.    Historical Provider, MD  glimepiride (AMARYL) 1 MG tablet Take 1 mg by mouth daily.     Historical Provider, MD  indapamide (LOZOL) 2.5 MG tablet TAKE 1 TABLET (2.5 MG TOTAL) BY MOUTH EVERY MORNING. 06/04/15   Josue Hector, MD  Multiple Vitamin  (MULITIVITAMIN WITH MINERALS) TABS Take 1 tablet by mouth daily.    Historical Provider, MD  NIFEdipine (PROCARDIA XL/ADALAT-CC) 60 MG 24 hr tablet Take 1 tablet (60 mg total) by mouth daily. 05/05/15   Josue Hector, MD  NITROSTAT 0.4 MG SL tablet PLACE 1 TABLET UNDER THE TONGUE EVERY 5 MINUTES AS NEEDED FOR CHEST PAIN 09/25/14   Josue Hector, MD  pioglitazone (ACTOS) 15 MG tablet Take 15 mg by mouth daily.    Historical Provider, MD  potassium chloride (K-DUR,KLOR-CON) 10 MEQ tablet Take 20 mEq by mouth daily.    Historical Provider, MD  PROAIR HFA 108 (90 BASE) MCG/ACT inhaler  09/11/14   Historical Provider, MD  rosuvastatin (CRESTOR) 20 MG tablet Take 10 mg by mouth at bedtime as needed and may repeat dose one time if needed.     Historical Provider, MD  sulfamethoxazole-trimethoprim (BACTRIM DS,SEPTRA DS) 800-160 MG per  tablet Take 2 tablets by mouth 2 (two) times daily. 07/07/15 07/14/15  Dalia Heading, PA-C  traMADol (ULTRAM) 50 MG tablet Take 1 tablet (50 mg total) by mouth 3 (three) times daily as needed. 02/11/15   Charlett Blake, MD   Physical Exam: Filed Vitals:   07/12/15 2045 07/12/15 2100 07/12/15 2115 07/12/15 2334  BP: 148/62 135/56 148/75 155/65  Pulse: 88 69 80 87  Temp:      TempSrc:      Resp:   18 16  SpO2: 96% 95% 100% 97%     General:  Adult female, no acute distress  Eyes:  PERRL, anicteric, non-injected.  ENT:  Nares clear.  OP clear, non-erythematous without plaques or exudates.  MMM.  Neck:  Supple without TM or JVD.    Lymph:  No cervical, supraclavicular, or submandibular LAD.  Cardiovascular:  RRR, normal S1, S2, without m/r/g.  2+ pulses, warm extremities  Respiratory:  CTA bilaterally without increased WOB.  Abdomen:  NABS.  Soft, ND/NT.    Skin:  4-5cm ulceration with necrotic material centrally located on the anterior right shin. She has 2 other areas of ulceration that are much smaller that are adjacent to the central ulceration. Her  leg is warm, injected up to the knee but without a clear line of demarcation of cellulitis.  Musculoskeletal:  Normal bulk and tone.  1+ pitting bilateral LE edema.  Psychiatric:  A & O x 4.  Appropriate affect.  Neurologic:  CN 3-12 intact.  5/5 strength.  Sensation intact.  Labs on Admission:  Basic Metabolic Panel:  Recent Labs Lab 07/07/15 2106 07/12/15 2105  NA 138 125*  K 3.5 3.9  CL 99* 92*  CO2 31 23  GLUCOSE 169* 154*  BUN 31* 33*  CREATININE 0.86 1.18*  CALCIUM 9.1 8.5*   Liver Function Tests:  Recent Labs Lab 07/12/15 2105  AST 30  ALT 18  ALKPHOS 79  BILITOT 0.5  PROT 6.6  ALBUMIN 3.5   No results for input(s): LIPASE, AMYLASE in the last 168 hours. No results for input(s): AMMONIA in the last 168 hours. CBC:  Recent Labs Lab 07/07/15 2106 07/12/15 2105  WBC 7.0 5.6  NEUTROABS 4.8 4.2  HGB 11.7* 10.6*  HCT 37.1 32.1*  MCV 94.2 90.9  PLT 242 229   Cardiac Enzymes: No results for input(s): CKTOTAL, CKMB, CKMBINDEX, TROPONINI in the last 168 hours.  BNP (last 3 results) No results for input(s): BNP in the last 8760 hours.  ProBNP (last 3 results) No results for input(s): PROBNP in the last 8760 hours.  CBG: No results for input(s): GLUCAP in the last 168 hours.  Radiological Exams on Admission: Dg Tibia/fibula Right  07/12/2015   CLINICAL DATA:  81 year old female with open wound and the anterior aspect of the right tibia fibula.  EXAM: RIGHT TIBIA AND FIBULA - 2 VIEW  COMPARISON:  Radiograph dated 09/26/2009  FINDINGS: There is no acute fracture or dislocation. The bones are osteopenic. There is a focal defect in the anterior skin in the distal aspect of the right leg corresponding to the known ulcer. No radiopaque foreign object identified.  IMPRESSION: Skin ulcer over the anterior distal leg.  No acute osseous abnormality.   Electronically Signed   By: Anner Crete M.D.   On: 07/12/2015 22:21    EKG:  pending  Assessment/Plan Principal Problem:   Cellulitis of right leg Active Problems:   Diabetes mellitus type 2 with peripheral artery disease  Essential hypertension   Coronary atherosclerosis   Aortic valve regurgitation   COPD (chronic obstructive pulmonary disease)   Ulcer of right lower leg   Hyponatremia   Chronic diastolic heart failure  ---  Right lower extremity ulceration, cellulitis. I suspect that she has some underlying peripheral arterial disease which is contributing to the nonhealing ulcer.  She has failed outpatient Bactrim and Augmentin -  Check ABI -  Continue vancomycin and ceftriaxone -  Wound care consultation -  Start Santyl with wet-to-dry dressings pending wound care assessment  Hyponatremia, no history of cirrhosis but does have heart failure -  Check urine osmolality, urine sodium, serum osmolality -  Check TSH and cortisol level and BNP  Mildly elevated creatinine -  Hold direct and ARB -  Repeat in a.m.  Aortic valve regurgitation/aortic insufficiency, moderate regurgitation demonstrated on echocardiogram from 2013, EF 55-60% with moderate LVH  Chronic diastolic heart failure -  Hold Lasix and indapamide pending further workup for hyponatremia and acute kidney injury  CAD, patient is chest pain free -  ASA, high dose statin, CCB.  Not on BB currently  HTN/HLD, blood pressure stable to mildly elevated -  Continue ARB, CCB -  Treat pain  Diabetes mellitus type 2, with mild hyperglycemia -  Hold oral medications -  Low dose SSI and HS insulin  COPD, stable, continue Advair  Diet:  Diabetic/healthy heart Access:  PIV IVF:  None Proph:  Lovenox  Code Status: Full code Family Communication: Patient alone with interpreter Disposition Plan: Admit to [facility]  Time spent: 60 min SHORT, MACKENZIE [facility] Pager [phone]  If 7PM-7AM, please contact night-coverage [email or web address] Password [email or web address] 07/13/2015, 12:03  AM

## 2015-07-12 NOTE — ED Provider Notes (Signed)
CSN: [id number]     Arrival date & time 07/12/15  1725 History   First MD Initiated Contact with Patient 07/12/15 2010     Chief Complaint  Patient presents with  . Leg Pain     (Consider location/radiation/quality/duration/timing/severity/associated sxs/prior Treatment) HPI Patient has a deep ulcer on her right lower leg. She reports that she has been taking medication at home for but is not getting better and her pain is worsening. She had been started on Bactrim and Augmentin most recently, she also has finished a prescription of Keflex. She states some of these things are helping and it is becoming increasingly painful and difficult to walk on. Past Medical History  Diagnosis Date  . Diabetes mellitus   . Hypertension   . Coronary artery disease   . Aortic insufficiency   . Hyperlipidemia   . Aortic aneurysm   . Hypercholesterolemia   . COPD (chronic obstructive pulmonary disease)   . Bronchitis   . Edema   . GERD (gastroesophageal reflux disease)   . Aortic valve disease     stable  . Pain in joint, forearm   . Pain in joint, hand   . Pain in joint, lower leg   . Difficulty in walking(719.7)   . Disturbance of skin sensation   . Cancer    Past Surgical History  Procedure Laterality Date  . Pars plana vitrectomy      right eye  . Posterior capsulectomy      right eye  . Pars plana vitrectomy w/ endophotocoagulation      right eye  . Fiberoptic bronch with endobronchial u/s  07/02/2010  . Video bronchoscopy  12/09/2009  . 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 Bronch with endobronchial biopies  09/15/2009  . Colonoscopy N/A 09/13/2013    Procedure: COLONOSCOPY;  Surgeon: Jeryl Columbia, MD;  Location: [facility];  Service: Endoscopy;  Laterality: N/A;   Family History  Problem Relation Age of Onset  . Hypertension Mother   . Hypertension Father    Social History  Substance Use Topics  . Smoking status: Never Smoker    . Smokeless tobacco: Never Used  . Alcohol Use: No   OB History    Gravida Para Term Preterm AB TAB SAB Ectopic Multiple Living   '2 1 1  1  1   1     '$ Review of Systems  10 Systems reviewed and are negative for acute change except as noted in the HPI.   Allergies  Review of patient's allergies indicates no known allergies.  Home Medications   Prior to Admission medications   Medication Sig Start Date End Date Taking? Authorizing Provider  ACCU-CHEK AVIVA PLUS test strip  08/22/14   Historical Provider, MD  ACCU-CHEK SOFTCLIX LANCETS lancets  09/18/14   Historical Provider, MD  acetaminophen (TYLENOL) 325 MG tablet Take 325 mg by mouth every 6 (six) hours as needed for pain. For pain    Historical Provider, MD  acetaminophen-codeine (TYLENOL #4) 300-60 MG per tablet Take 1 tablet by mouth every 8 (eight) hours as needed for moderate pain. 02/18/15   Charlett Blake, MD  ADVAIR [facility] 230-21 MCG/ACT inhaler Inhale 2 puffs into the lungs 2 (two) times daily.  11/19/12   Historical Provider, MD  amoxicillin-clavulanate (AUGMENTIN) 875-125 MG per tablet Take 1 tablet by mouth every 12 (twelve) hours. 07/07/15   Dalia Heading, PA-C  aspirin 81 MG tablet Take 81 mg  by mouth daily.     Historical Provider, MD  BENICAR 40 MG tablet TAKE 1 TABLET (40 MG TOTAL) BY MOUTH 2 (TWO) TIMES DAILY. 06/04/15   Josue Hector, MD  diclofenac sodium (VOLTAREN) 1 % GEL APPLY 2 GRAMS TOPICALLY 4 (FOUR) TIMES DAILY. 07/09/15   Charlett Blake, MD  FeFum-FePoly-FA-B Cmp-C-Biot (INTEGRA PLUS) CAPS TAKE ONE CAPSULE BY MOUTH EVERY DAY *NOT COVERED* 05/05/15   Curt Bears, MD  furosemide (LASIX) 20 MG tablet Take 20 mg by mouth daily as needed for fluid.    Historical Provider, MD  glimepiride (AMARYL) 1 MG tablet Take 1 mg by mouth daily.     Historical Provider, MD  indapamide (LOZOL) 2.5 MG tablet TAKE 1 TABLET (2.5 MG TOTAL) BY MOUTH EVERY MORNING. 06/04/15   Josue Hector, MD  Multiple Vitamin  (MULITIVITAMIN WITH MINERALS) TABS Take 1 tablet by mouth daily.    Historical Provider, MD  NIFEdipine (PROCARDIA XL/ADALAT-CC) 60 MG 24 hr tablet Take 1 tablet (60 mg total) by mouth daily. 05/05/15   Josue Hector, MD  NITROSTAT 0.4 MG SL tablet PLACE 1 TABLET UNDER THE TONGUE EVERY 5 MINUTES AS NEEDED FOR CHEST PAIN 09/25/14   Josue Hector, MD  pioglitazone (ACTOS) 15 MG tablet Take 15 mg by mouth daily.    Historical Provider, MD  potassium chloride (K-DUR,KLOR-CON) 10 MEQ tablet Take 20 mEq by mouth daily.    Historical Provider, MD  PROAIR HFA 108 (90 BASE) MCG/ACT inhaler  09/11/14   Historical Provider, MD  rosuvastatin (CRESTOR) 20 MG tablet Take 10 mg by mouth at bedtime as needed and may repeat dose one time if needed.     Historical Provider, MD  sulfamethoxazole-trimethoprim (BACTRIM DS,SEPTRA DS) 800-160 MG per tablet Take 2 tablets by mouth 2 (two) times daily. 07/07/15 07/14/15  Dalia Heading, PA-C  traMADol (ULTRAM) 50 MG tablet Take 1 tablet (50 mg total) by mouth 3 (three) times daily as needed. 02/11/15   Charlett Blake, MD   BP 148/75 mmHg  Pulse 80  Temp(Src) 98.7 F (37.1 C) (Oral)  Resp 18  SpO2 100% Physical Exam  Constitutional: She is oriented to person, place, and time. She appears well-developed and well-nourished.  HENT:  Head: Normocephalic and atraumatic.  Eyes: EOM are normal. Pupils are equal, round, and reactive to light.  Neck: Neck supple.  Cardiovascular: Normal rate, regular rhythm, normal heart sounds and intact distal pulses.   Pulmonary/Chest: Effort normal and breath sounds normal.  Abdominal: Soft. Bowel sounds are normal. She exhibits no distension. There is no tenderness.  Musculoskeletal: Normal range of motion. She exhibits edema and tenderness.  Patient has an ulcerative wound on her right lower leg as well as edema and erythema see image below  Neurological: She is alert and oriented to person, place, and time. She has normal  strength. Coordination normal. GCS eye subscore is 4. GCS verbal subscore is 5. GCS motor subscore is 6.  Skin: Skin is warm, dry and intact.  Psychiatric: She has a normal mood and affect.        ED Course  Procedures (including critical care time) Labs Review Labs Reviewed  COMPREHENSIVE METABOLIC PANEL - Abnormal; Notable for the following:    Sodium 125 (*)    Chloride 92 (*)    Glucose, Bld 154 (*)    BUN 33 (*)    Creatinine, Ser 1.18 (*)    Calcium 8.5 (*)    GFR calc non  Af Amer 42 (*)    GFR calc Af Amer 49 (*)    All other components within normal limits  CBC WITH DIFFERENTIAL/PLATELET - Abnormal; Notable for the following:    RBC 3.53 (*)    Hemoglobin 10.6 (*)    HCT 32.1 (*)    All other components within normal limits    Imaging Review Dg Tibia/fibula Right  07/12/2015   CLINICAL DATA:  81 year old female with open wound and the anterior aspect of the right tibia fibula.  EXAM: RIGHT TIBIA AND FIBULA - 2 VIEW  COMPARISON:  Radiograph dated 09/26/2009  FINDINGS: There is no acute fracture or dislocation. The bones are osteopenic. There is a focal defect in the anterior skin in the distal aspect of the right leg corresponding to the known ulcer. No radiopaque foreign object identified.  IMPRESSION: Skin ulcer over the anterior distal leg.  No acute osseous abnormality.   Electronically Signed   By: Anner Crete M.D.   On: 07/12/2015 22:21   I have personally reviewed and evaluated these images and lab results as part of my medical decision-making.   EKG Interpretation None      MDM   Final diagnoses:  Cellulitis of right lower extremity  Ulcer of calf with fat layer exposed, right  Peripheral vascular disease   Patient has worsening pain and swelling and erythema despite outpatient treatment for lower extremity ulcer with cellulitis. At this point time she will be admitted for IV antibiotics and further diagnostic workup as needed.    Charlesetta Shanks, MD 07/12/15 [phone]

## 2015-07-13 ENCOUNTER — Inpatient Hospital Stay (HOSPITAL_COMMUNITY): Payer: Medicare Other

## 2015-07-13 ENCOUNTER — Encounter (HOSPITAL_COMMUNITY): Payer: Self-pay | Admitting: General Practice

## 2015-07-13 DIAGNOSIS — L97919 Non-pressure chronic ulcer of unspecified part of right lower leg with unspecified severity: Secondary | ICD-10-CM

## 2015-07-13 LAB — GLUCOSE, CAPILLARY
GLUCOSE-CAPILLARY: 152 mg/dL — AB (ref 65–99)
GLUCOSE-CAPILLARY: 95 mg/dL (ref 65–99)
Glucose-Capillary: 102 mg/dL — ABNORMAL HIGH (ref 65–99)
Glucose-Capillary: 105 mg/dL — ABNORMAL HIGH (ref 65–99)
Glucose-Capillary: 112 mg/dL — ABNORMAL HIGH (ref 65–99)

## 2015-07-13 LAB — CBC
HCT: 34.1 % — ABNORMAL LOW (ref 36.0–46.0)
Hemoglobin: 11.1 g/dL — ABNORMAL LOW (ref 12.0–15.0)
MCH: 29.8 pg (ref 26.0–34.0)
MCHC: 32.6 g/dL (ref 30.0–36.0)
MCV: 91.4 fL (ref 78.0–100.0)
PLATELETS: 258 10*3/uL (ref 150–400)
RBC: 3.73 MIL/uL — ABNORMAL LOW (ref 3.87–5.11)
RDW: 13.3 % (ref 11.5–15.5)
WBC: 4.6 10*3/uL (ref 4.0–10.5)

## 2015-07-13 LAB — OSMOLALITY: Osmolality: 281 mOsm/kg (ref 275–300)

## 2015-07-13 LAB — BASIC METABOLIC PANEL
Anion gap: 9 (ref 5–15)
BUN: 22 mg/dL — AB (ref 6–20)
CALCIUM: 9.2 mg/dL (ref 8.9–10.3)
CO2: 26 mmol/L (ref 22–32)
CREATININE: 0.96 mg/dL (ref 0.44–1.00)
Chloride: 100 mmol/L — ABNORMAL LOW (ref 101–111)
GFR calc Af Amer: >60 mL/min (ref >60)
GFR, EST NON AFRICAN AMERICAN: 54 mL/min — AB (ref >60)
Glucose, Bld: 93 mg/dL (ref 65–99)
Potassium: 4.3 mmol/L (ref 3.5–5.1)
SODIUM: 135 mmol/L (ref 135–145)

## 2015-07-13 LAB — TSH: TSH: 1.792 u[IU]/mL (ref 0.350–4.500)

## 2015-07-13 LAB — CORTISOL-AM, BLOOD: CORTISOL - AM: 16.8 ug/dL (ref 6.7–22.6)

## 2015-07-13 LAB — BRAIN NATRIURETIC PEPTIDE: B NATRIURETIC PEPTIDE 5: 257.8 pg/mL — AB (ref 0.0–100.0)

## 2015-07-13 MED ORDER — VANCOMYCIN HCL IN DEXTROSE 750-5 MG/150ML-% IV SOLN
750.0000 mg | INTRAVENOUS | Status: DC
  Administered 2015-07-13 – 2015-07-14 (×2): 750 mg via INTRAVENOUS
  Filled 2015-07-13 (×3): qty 150

## 2015-07-13 MED ORDER — ACETAMINOPHEN 325 MG PO TABS
650.0000 mg | ORAL_TABLET | Freq: Four times a day (QID) | ORAL | Status: DC | PRN
  Administered 2015-07-13 – 2015-07-15 (×2): 650 mg via ORAL
  Filled 2015-07-13 (×2): qty 2

## 2015-07-13 MED ORDER — BISACODYL 10 MG RE SUPP: 10.0000 mg | Freq: Every day | RECTAL | Status: DC | PRN

## 2015-07-13 MED ORDER — PANTOPRAZOLE SODIUM 40 MG PO TBEC
40.0000 mg | DELAYED_RELEASE_TABLET | Freq: Every day | ORAL | Status: DC
  Administered 2015-07-13 – 2015-07-17 (×5): 40 mg via ORAL
  Filled 2015-07-13 (×4): qty 1

## 2015-07-13 MED ORDER — DOCUSATE SODIUM 100 MG PO CAPS
100.0000 mg | ORAL_CAPSULE | Freq: Two times a day (BID) | ORAL | Status: DC
Start: 2015-07-13 — End: 2015-07-17
  Administered 2015-07-13 – 2015-07-17 (×10): 100 mg via ORAL
  Filled 2015-07-13 (×10): qty 1

## 2015-07-13 MED ORDER — PNEUMOCOCCAL VAC POLYVALENT 25 MCG/0.5ML IJ INJ
0.5000 mL | INJECTION | INTRAMUSCULAR | Status: DC
  Filled 2015-07-13: qty 0.5

## 2015-07-13 MED ORDER — ACETAMINOPHEN 650 MG RE SUPP
650.0000 mg | Freq: Four times a day (QID) | RECTAL | Status: DC | PRN
Start: 2015-07-13 — End: 2015-07-17

## 2015-07-13 MED ORDER — ASPIRIN EC 81 MG PO TBEC
81.0000 mg | DELAYED_RELEASE_TABLET | Freq: Every day | ORAL | Status: DC
  Administered 2015-07-13 – 2015-07-17 (×5): 81 mg via ORAL
  Filled 2015-07-13 (×5): qty 1

## 2015-07-13 MED ORDER — DEXTROSE 5 % IV SOLN: 1.0000 g | Freq: Once | INTRAVENOUS | Status: DC

## 2015-07-13 MED ORDER — SENNA 8.6 MG PO TABS
1.0000 | ORAL_TABLET | Freq: Two times a day (BID) | ORAL | Status: DC
  Administered 2015-07-13 – 2015-07-16 (×9): 8.6 mg via ORAL
  Filled 2015-07-13 (×8): qty 1

## 2015-07-13 MED ORDER — VANCOMYCIN HCL IN DEXTROSE 750-5 MG/150ML-% IV SOLN
750.0000 mg | Freq: Once | INTRAVENOUS | Status: DC
  Filled 2015-07-13: qty 150

## 2015-07-13 MED ORDER — OXYCODONE HCL 5 MG PO TABS
5.0000 mg | ORAL_TABLET | ORAL | Status: DC | PRN
  Administered 2015-07-13 – 2015-07-17 (×13): 10 mg via ORAL
  Filled 2015-07-13 (×13): qty 2

## 2015-07-13 MED ORDER — INSULIN ASPART 100 UNIT/ML ~~LOC~~ SOLN
0.0000 [IU] | Freq: Three times a day (TID) | SUBCUTANEOUS | Status: DC
  Administered 2015-07-13 – 2015-07-15 (×4): 2 [IU] via SUBCUTANEOUS
  Administered 2015-07-15: 1 [IU] via SUBCUTANEOUS
  Administered 2015-07-15 – 2015-07-16 (×4): 2 [IU] via SUBCUTANEOUS
  Administered 2015-07-17: 1 [IU] via SUBCUTANEOUS

## 2015-07-13 MED ORDER — POLYETHYLENE GLYCOL 3350 17 G PO PACK: 17.0000 g | PACK | Freq: Every day | ORAL | Status: DC | PRN

## 2015-07-13 MED ORDER — COLLAGENASE 250 UNIT/GM EX OINT
TOPICAL_OINTMENT | Freq: Two times a day (BID) | CUTANEOUS | Status: DC
  Administered 2015-07-13 – 2015-07-17 (×9): via TOPICAL
  Filled 2015-07-13: qty 30

## 2015-07-13 MED ORDER — MOMETASONE FURO-FORMOTEROL FUM 200-5 MCG/ACT IN AERO
2.0000 | INHALATION_SPRAY | Freq: Two times a day (BID) | RESPIRATORY_TRACT | Status: DC
  Administered 2015-07-13 – 2015-07-14 (×3): 2 via RESPIRATORY_TRACT
  Filled 2015-07-13: qty 8.8

## 2015-07-13 MED ORDER — ENOXAPARIN SODIUM 40 MG/0.4ML ~~LOC~~ SOLN
40.0000 mg | SUBCUTANEOUS | Status: DC
  Administered 2015-07-13 – 2015-07-16 (×4): 40 mg via SUBCUTANEOUS
  Filled 2015-07-13 (×4): qty 0.4

## 2015-07-13 MED ORDER — ALBUTEROL SULFATE (2.5 MG/3ML) 0.083% IN NEBU: 2.5000 mg | INHALATION_SOLUTION | RESPIRATORY_TRACT | Status: DC | PRN

## 2015-07-13 MED ORDER — SODIUM CHLORIDE 0.9 % IV SOLN
INTRAVENOUS | Status: DC
  Administered 2015-07-13: 03:00:00 via INTRAVENOUS

## 2015-07-13 MED ORDER — SODIUM CHLORIDE 0.9 % IV SOLN: INTRAVENOUS | Status: AC

## 2015-07-13 MED ORDER — ROSUVASTATIN CALCIUM 10 MG PO TABS
10.0000 mg | ORAL_TABLET | Freq: Every day | ORAL | Status: DC
  Administered 2015-07-13 – 2015-07-17 (×5): 10 mg via ORAL
  Filled 2015-07-13 (×5): qty 1

## 2015-07-13 MED ORDER — NIFEDIPINE ER 60 MG PO TB24
60.0000 mg | ORAL_TABLET | Freq: Every day | ORAL | Status: DC
  Administered 2015-07-13 – 2015-07-17 (×5): 60 mg via ORAL
  Filled 2015-07-13 (×7): qty 1

## 2015-07-13 MED ORDER — ADULT MULTIVITAMIN W/MINERALS CH
1.0000 | ORAL_TABLET | Freq: Every day | ORAL | Status: DC
  Administered 2015-07-13 – 2015-07-17 (×5): 1 via ORAL
  Filled 2015-07-13 (×4): qty 1

## 2015-07-13 MED ORDER — NITROGLYCERIN 0.4 MG SL SUBL: 0.4000 mg | SUBLINGUAL_TABLET | SUBLINGUAL | Status: DC | PRN

## 2015-07-13 MED ORDER — DICLOFENAC SODIUM 1 % TD GEL
4.0000 g | Freq: Three times a day (TID) | TRANSDERMAL | Status: DC
  Administered 2015-07-14 – 2015-07-17 (×6): 4 g via TOPICAL
  Filled 2015-07-13 (×2): qty 100

## 2015-07-13 MED ORDER — IRBESARTAN 300 MG PO TABS
300.0000 mg | ORAL_TABLET | Freq: Every day | ORAL | Status: DC
  Administered 2015-07-13 – 2015-07-17 (×5): 300 mg via ORAL
  Filled 2015-07-13 (×7): qty 1

## 2015-07-13 MED ORDER — DEXTROSE 5 % IV SOLN
1.0000 g | Freq: Once | INTRAVENOUS | Status: AC
  Administered 2015-07-13: 1 g via INTRAVENOUS
  Filled 2015-07-13: qty 10

## 2015-07-13 MED ORDER — OXYCODONE HCL 5 MG PO TABS
5.0000 mg | ORAL_TABLET | ORAL | Status: DC | PRN
  Administered 2015-07-13: 5 mg via ORAL
  Filled 2015-07-13: qty 1

## 2015-07-13 MED ORDER — INFLUENZA VAC SPLIT QUAD 0.5 ML IM SUSY
0.5000 mL | PREFILLED_SYRINGE | INTRAMUSCULAR | Status: DC
  Filled 2015-07-13: qty 0.5

## 2015-07-13 NOTE — Progress Notes (Signed)
Utilization Review Completed.Dowell, Deborah T9/18/2016  

## 2015-07-13 NOTE — Progress Notes (Signed)
VASCULAR LAB PRELIMINARY  ARTERIAL  ABI completed:  Limited by patient's pain.  Unable to obtain right pressures.  Patient refused left PT pressure after pain with the DP.    RIGHT    LEFT    PRESSURE WAVEFORM  PRESSURE WAVEFORM  BRACHIAL 178 Triphasic  BRACHIAL 181 Triphasic   DP Pain;wound Monophasic  DP 138 Triphasic   AT   AT    PT Pain, wound Monophasic  PT pain Triphasic   PER   PER    GREAT TOE  NA GREAT TOE  NA    RIGHT LEFT  ABI Unable to obtain 0.76     Cestone,[name], RVT 07/13/2015, 3:15 PM

## 2015-07-13 NOTE — Progress Notes (Signed)
ANTIBIOTIC CONSULT NOTE - INITIAL  Pharmacy Consult for Vancomycin Indication: cellulitis  No Known Allergies  Patient Measurements: Weight: 169 lb 14.4 oz (77.066 kg) Adjusted Body Weight:   Vital Signs: Temp: 97.5 F (36.4 C) (09/18 0026) Temp Source: Oral (09/18 0026) BP: 130/75 mmHg (09/18 0026) Pulse Rate: 89 (09/18 0026) Intake/Output from previous day:   Intake/Output from this shift:    Labs:  Recent Labs  07/12/15 2105  WBC 5.6  HGB 10.6*  PLT 229  CREATININE 1.18*   Estimated Creatinine Clearance: 33.5 mL/min (by C-G formula based on Cr of 1.18). No results for input(s): VANCOTROUGH, VANCOPEAK, VANCORANDOM, GENTTROUGH, GENTPEAK, GENTRANDOM, TOBRATROUGH, TOBRAPEAK, TOBRARND, AMIKACINPEAK, AMIKACINTROU, AMIKACIN in the last 72 hours.   Microbiology: No results found for this or any previous visit (from the past 720 hour(s)).  Medical History: Past Medical History  Diagnosis Date  . Diabetes mellitus   . Hypertension   . Coronary artery disease   . Aortic insufficiency   . Hyperlipidemia   . Aortic aneurysm   . Hypercholesterolemia   . COPD (chronic obstructive pulmonary disease)   . Bronchitis   . Edema   . GERD (gastroesophageal reflux disease)   . Aortic valve disease     stable  . Pain in joint, forearm   . Pain in joint, hand   . Pain in joint, lower leg   . Difficulty in walking(719.7)   . Disturbance of skin sensation   . Cancer     Medications:  Prescriptions prior to admission  Medication Sig Dispense Refill Last Dose  . ACCU-CHEK AVIVA PLUS test strip   5 Taking  . ACCU-CHEK SOFTCLIX LANCETS lancets   5 Taking  . acetaminophen (TYLENOL) 325 MG tablet Take 325 mg by mouth every 6 (six) hours as needed for pain. For pain   Taking  . acetaminophen-codeine (TYLENOL #4) 300-60 MG per tablet Take 1 tablet by mouth every 8 (eight) hours as needed for moderate pain. 90 tablet 1 Taking  . ADVAIR HFA 230-21 MCG/ACT inhaler Inhale 2 puffs  into the lungs 2 (two) times daily.    Taking  . amoxicillin-clavulanate (AUGMENTIN) 875-125 MG per tablet Take 1 tablet by mouth every 12 (twelve) hours. 20 tablet 0   . aspirin 81 MG tablet Take 81 mg by mouth daily.    Taking  . BENICAR 40 MG tablet TAKE 1 TABLET (40 MG TOTAL) BY MOUTH 2 (TWO) TIMES DAILY. 60 tablet 1   . diclofenac sodium (VOLTAREN) 1 % GEL APPLY 2 GRAMS TOPICALLY 4 (FOUR) TIMES DAILY. 300 g 1   . FeFum-FePoly-FA-B Cmp-C-Biot (INTEGRA PLUS) CAPS TAKE ONE CAPSULE BY MOUTH EVERY DAY *NOT COVERED* 30 capsule 5   . furosemide (LASIX) 20 MG tablet Take 20 mg by mouth daily as needed for fluid.   Taking  . glimepiride (AMARYL) 1 MG tablet Take 1 mg by mouth daily.    Taking  . indapamide (LOZOL) 2.5 MG tablet TAKE 1 TABLET (2.5 MG TOTAL) BY MOUTH EVERY MORNING. 30 tablet 1   . Multiple Vitamin (MULITIVITAMIN WITH MINERALS) TABS Take 1 tablet by mouth daily.   Taking  . NIFEdipine (PROCARDIA XL/ADALAT-CC) 60 MG 24 hr tablet Take 1 tablet (60 mg total) by mouth daily. 30 tablet 3   . NITROSTAT 0.4 MG SL tablet PLACE 1 TABLET UNDER THE TONGUE EVERY 5 MINUTES AS NEEDED FOR CHEST PAIN 25 tablet 2 Taking  . pioglitazone (ACTOS) 15 MG tablet Take 15 mg by mouth daily.  Taking  . potassium chloride (K-DUR,KLOR-CON) 10 MEQ tablet Take 20 mEq by mouth daily.   Taking  . PROAIR HFA 108 (90 BASE) MCG/ACT inhaler    Taking  . rosuvastatin (CRESTOR) 20 MG tablet Take 10 mg by mouth at bedtime as needed and may repeat dose one time if needed.    Taking  . sulfamethoxazole-trimethoprim (BACTRIM DS,SEPTRA DS) 800-160 MG per tablet Take 2 tablets by mouth 2 (two) times daily. 40 tablet 0   . traMADol (ULTRAM) 50 MG tablet Take 1 tablet (50 mg total) by mouth 3 (three) times daily as needed. 90 tablet 5 Taking   Scheduled:  . sodium chloride   Intravenous STAT  . aspirin EC  81 mg Oral Daily  . [START ON 07/14/2015] cefTRIAXone (ROCEPHIN) IVPB 1 gram/50 mL D5W  1 g Intravenous Once  . collagenase    Topical BID  . diclofenac sodium  4 g Topical TID AC & HS  . docusate sodium  100 mg Oral BID  . enoxaparin (LOVENOX) injection  40 mg Subcutaneous Q24H  . [START ON 07/14/2015] Influenza vac split quadrivalent PF  0.5 mL Intramuscular Tomorrow-1000  . insulin aspart  0-9 Units Subcutaneous TID WC  . mometasone-formoterol  2 puff Inhalation BID  . multivitamin with minerals  1 tablet Oral Daily  . NIFEdipine  60 mg Oral Daily  . [START ON 07/14/2015] pneumococcal 23 valent vaccine  0.5 mL Intramuscular Tomorrow-1000  . rosuvastatin  10 mg Oral Daily  . senna  1 tablet Oral BID   Infusions:     Assessment: 81yo female with history of morbid obesity, CAD, AAA, DM2, HTN HLD and COPD presents with right leg pain and swelling. Pharmacy is consulted to dose vancomycin for cellulitis. Pt is afebrile, WBC 5.6, sCr 1.18.  Pt received CTX 1g and vancomycin 1g once in the ED.  Goal of Therapy:  Vancomycin trough level 10-15 mcg/ml  Plan:  Vancomycin '750mg'$  IV q24h Ceftriaxone 1g IV q24h Measure antibiotic drug levels at steady state Follow up culture results, renal function and clinical course  Andrey Cota. Diona Foley, PharmD Clinical Pharmacist Pager [phone] 07/13/2015,2:51 AM

## 2015-07-13 NOTE — Progress Notes (Signed)
Patient Demographics  [name], is a 81 y.o. female, DOB - Mar 09, 1932, CBJ:[id number]  Admit date - 07/12/2015   Admitting Physician Janece Canterbury, MD  Outpatient Primary MD for the patient is Pcp Not In System  LOS - 1   Chief Complaint  Patient presents with  . Leg Pain         Subjective:   Wilmoth Virella today has, No headache, No chest pain, No abdominal pain - No Nausea, No Cough - SOB.   Assessment & Plan    Principal Problem:   Cellulitis of right leg Active Problems:   Diabetes mellitus type 2 with peripheral artery disease   Essential hypertension   Coronary atherosclerosis   Aortic valve regurgitation   COPD (chronic obstructive pulmonary disease)   Ulcer of right lower leg   Hyponatremia   Chronic diastolic heart failure  Right lower extremity ulceration, cellulitis. - She has failed outpatient Bactrim and Augmentin, possible PAD contributing to it, follow on ABI. - Continue vancomycin and ceftriaxone - Wound care consultation - Start Santyl with wet-to-dry dressings pending wound care assessment  Hyponatremia,  - Most likely related to diuresis and volume depletion, resolved with gentle hydration. - TSH and cortisol level within normal limits  Mildly elevated creatinine - Resolved, resume ARB   Aortic valve regurgitation/aortic insufficiency,  - moderate regurgitation demonstrated on echocardiogram from 2013, EF 55-60% with moderate LVH  Chronic diastolic heart failure - Currently appears to be euvolemic - Resume Lasix and indapamide when patient is stable .  CAD, -  patient is chest pain free - ASA, high dose statin, CCB. Not on BB currently  HTN/HLD, - Acceptable - Continue ARB, CCB - Treat pain  Diabetes mellitus type 2 - CBG acceptable - Hold oral medications - Low dose SSI and HS insulin  COPD,  - stable, no active wheezing -  continue Advair  Code Status:Full  Family Communication: None at bedside  Disposition Plan: Home when stable   Procedures  None   Consults   None   Medications  Scheduled Meds: . aspirin EC  81 mg Oral Daily  . cefTRIAXone (ROCEPHIN) IVPB 1 gram/50 mL D5W  1 g Intravenous Once  . collagenase   Topical BID  . diclofenac sodium  4 g Topical TID AC & HS  . docusate sodium  100 mg Oral BID  . enoxaparin (LOVENOX) injection  40 mg Subcutaneous Q24H  . [START ON 07/14/2015] Influenza vac split quadrivalent PF  0.5 mL Intramuscular Tomorrow-1000  . insulin aspart  0-9 Units Subcutaneous TID WC  . mometasone-formoterol  2 puff Inhalation BID  . multivitamin with minerals  1 tablet Oral Daily  . NIFEdipine  60 mg Oral Daily  . [START ON 07/14/2015] pneumococcal 23 valent vaccine  0.5 mL Intramuscular Tomorrow-1000  . rosuvastatin  10 mg Oral Daily  . senna  1 tablet Oral BID  . vancomycin  750 mg Intravenous Once   Continuous Infusions: . sodium chloride     PRN Meds:.acetaminophen **OR** acetaminophen, albuterol, bisacodyl, nitroGLYCERIN, oxyCODONE, polyethylene glycol  DVT Prophylaxis  Lovenox -  Lab Results  Component Value Date   PLT 258 07/13/2015    Antibiotics    Anti-infectives    Start  Dose/Rate Route Frequency Ordered Stop   07/14/15 2230  cefTRIAXone (ROCEPHIN) 1 g in dextrose 5 % 50 mL IVPB  Status:  Discontinued     1 g 100 mL/hr over 30 Minutes Intravenous  Once 07/13/15 0251 07/13/15 0303   07/13/15 2230  cefTRIAXone (ROCEPHIN) 1 g in dextrose 5 % 50 mL IVPB     1 g 100 mL/hr over 30 Minutes Intravenous  Once 07/13/15 0303     07/13/15 2100  vancomycin (VANCOCIN) IVPB 750 mg/150 ml premix     750 mg 150 mL/hr over 60 Minutes Intravenous  Once 07/13/15 0303     07/12/15 2045  cefTRIAXone (ROCEPHIN) 1 g in dextrose 5 % 50 mL IVPB     1 g 100 mL/hr over 30 Minutes Intravenous  Once 07/12/15 2043 07/12/15 2314   07/12/15 2045  vancomycin (VANCOCIN)  IVPB 1000 mg/200 mL premix     1,000 mg 200 mL/hr over 60 Minutes Intravenous  Once 07/12/15 2043 07/12/15 2242          Objective:   Filed Vitals:   07/13/15 0026 07/13/15 0503 07/13/15 0803 07/13/15 0909  BP: 130/75 146/53 148/67   Pulse: 89 73 74   Temp: 97.5 F (36.4 C) 98 F (36.7 C) 98.6 F (37 C)   TempSrc: Oral Oral Oral   Resp: '18 16 18   '$ Weight: 77.066 kg (169 lb 14.4 oz)     SpO2: 95% 93% 94% 92%    Wt Readings from Last 3 Encounters:  07/13/15 77.066 kg (169 lb 14.4 oz)  04/08/15 80.74 kg (178 lb)  10/04/14 78.926 kg (174 lb)     Intake/Output Summary (Last 24 hours) at 07/13/15 1334 Last data filed at 07/13/15 1026  Gross per 24 hour  Intake    240 ml  Output      1 ml  Net    239 ml     Physical Exam  Awake Alert,  [place].AT,PERRAL Supple Neck,No JVD, No cervical lymphadenopathy appriciated.  Symmetrical Chest wall movement, Good air movement bilaterally,  RRR,No Gallops,Rubs or new Murmurs, No Parasternal Heave +ve B.Sounds, Abd Soft, No tenderness, No organomegaly appriciated, No rebound - guarding or rigidity. No Cyanosis, Clubbing or edema, right skin bandage at the area of ulceration   Data Review   Micro Results No results found for this or any previous visit (from the past 240 hour(s)).  Radiology Reports Dg Tibia/fibula Right  07/12/2015   CLINICAL DATA:  81 year old female with open wound and the anterior aspect of the right tibia fibula.  EXAM: RIGHT TIBIA AND FIBULA - 2 VIEW  COMPARISON:  Radiograph dated 09/26/2009  FINDINGS: There is no acute fracture or dislocation. The bones are osteopenic. There is a focal defect in the anterior skin in the distal aspect of the right leg corresponding to the known ulcer. No radiopaque foreign object identified.  IMPRESSION: Skin ulcer over the anterior distal leg.  No acute osseous abnormality.   Electronically Signed   By: Anner Crete M.D.   On: 07/12/2015 22:21     CBC  Recent Labs Lab  07/07/15 2106 07/12/15 2105 07/13/15 0605  WBC 7.0 5.6 4.6  HGB 11.7* 10.6* 11.1*  HCT 37.1 32.1* 34.1*  PLT 242 229 258  MCV 94.2 90.9 91.4  MCH 29.7 30.0 29.8  MCHC 31.5 33.0 32.6  RDW 13.4 13.4 13.3  LYMPHSABS 1.4 0.8  --   MONOABS 0.6 0.5  --   EOSABS 0.1 0.0  --  BASOSABS 0.0 0.0  --     Chemistries   Recent Labs Lab 07/07/15 2106 07/12/15 2105 07/13/15 0605  NA 138 125* 135  K 3.5 3.9 4.3  CL 99* 92* 100*  CO2 '31 23 26  '$ GLUCOSE 169* 154* 93  BUN 31* 33* 22*  CREATININE 0.86 1.18* 0.96  CALCIUM 9.1 8.5* 9.2  AST  --  30  --   ALT  --  18  --   ALKPHOS  --  79  --   BILITOT  --  0.5  --    ------------------------------------------------------------------------------------------------------------------ estimated creatinine clearance is 41.2 mL/min (by C-G formula based on Cr of 0.96). ------------------------------------------------------------------------------------------------------------------ No results for input(s): HGBA1C in the last 72 hours. ------------------------------------------------------------------------------------------------------------------ No results for input(s): CHOL, HDL, LDLCALC, TRIG, CHOLHDL, LDLDIRECT in the last 72 hours. ------------------------------------------------------------------------------------------------------------------  Recent Labs  07/13/15 0117  TSH 1.792   ------------------------------------------------------------------------------------------------------------------ No results for input(s): VITAMINB12, FOLATE, FERRITIN, TIBC, IRON, RETICCTPCT in the last 72 hours.  Coagulation profile No results for input(s): INR, PROTIME in the last 168 hours.  No results for input(s): DDIMER in the last 72 hours.  Cardiac Enzymes No results for input(s): CKMB, TROPONINI, MYOGLOBIN in the last 168 hours.  Invalid input(s):  CK ------------------------------------------------------------------------------------------------------------------ Invalid input(s): POCBNP     Time Spent in minutes   35 Minutes   ELGERGAWY, DAWOOD M.D on 07/13/2015 at 1:34 PM  Between 7am to 7pm - Pager - [phone]  After 7pm go to [email or web address] - password [facility]  [facility]   Office  [phone]

## 2015-07-13 NOTE — Consult Note (Signed)
WOC wound consult note Reason for Consult: Chronic ulcer on right lateral LE (central ulcer with two satellite ulcers) Wound type: Presentation is consistent with PAD (arterial insufficiency) Pressure Ulcer POA:No Measurement: punctate, smooth periphery with central ulcer measuring 3.8cm x 3cm with depth unable to be determined as base is obscured by the presence of necrotic tissue (white, dense, firmly adherent).  Two satellite lesions (also round, punctate) measuring 1cm x 1cm x 0.2cm present laterally at 9 and 11 o'clock, red, moist wound beds with dried serum (scab). Wound bed: Central ulcer and two satellite ulcers as described above Drainage (amount, consistency, odor) Scant serous Periwound: dry, intact Dressing procedure/placement/frequency: Will continue collagenase (santyl) to dissolve enzymatically this eschar.  ABI/ABPI is indicated to determine degree of arterial insufficiency. If below 0.8 on this extremity, please consider consultation to [facility]VS. As patient is complaining of a significant amount of discomfort in the area of the ulceration, a padded foam boot is provided for her ulcer while in bed. [place] nursing team will not follow, but will remain available to this patient, the nursing and medical teams.  Please re-consult if needed. Thanks, Maudie Flakes, MSN, RN, [place], [place], [place] [phone])

## 2015-07-14 ENCOUNTER — Encounter (HOSPITAL_BASED_OUTPATIENT_CLINIC_OR_DEPARTMENT_OTHER): Payer: Medicare Other | Attending: Plastic Surgery

## 2015-07-14 DIAGNOSIS — S81801A Unspecified open wound, right lower leg, initial encounter: Secondary | ICD-10-CM

## 2015-07-14 LAB — GLUCOSE, CAPILLARY
GLUCOSE-CAPILLARY: 97 mg/dL (ref 65–99)
GLUCOSE-CAPILLARY: 159 mg/dL — AB (ref 65–99)
GLUCOSE-CAPILLARY: 199 mg/dL — AB (ref 65–99)
GLUCOSE-CAPILLARY: 160 mg/dL — AB (ref 65–99)

## 2015-07-14 LAB — HEMOGLOBIN A1C
HEMOGLOBIN A1C: 6.8 % — AB (ref 4.8–5.6)
Mean Plasma Glucose: 148 mg/dL

## 2015-07-14 LAB — BASIC METABOLIC PANEL
Anion gap: 8 (ref 5–15)
BUN: 18 mg/dL (ref 6–20)
CHLORIDE: 98 mmol/L — AB (ref 101–111)
CO2: 29 mmol/L (ref 22–32)
Calcium: 8.9 mg/dL (ref 8.9–10.3)
Creatinine, Ser: 0.84 mg/dL (ref 0.44–1.00)
GFR calc Af Amer: >60 mL/min (ref >60)
GFR calc non Af Amer: >60 mL/min (ref >60)
GLUCOSE: 167 mg/dL — AB (ref 65–99)
POTASSIUM: 4.1 mmol/L (ref 3.5–5.1)
SODIUM: 135 mmol/L (ref 135–145)

## 2015-07-14 MED ORDER — BUDESONIDE 0.5 MG/2ML IN SUSP
1.0000 mg | Freq: Two times a day (BID) | RESPIRATORY_TRACT | Status: DC
  Administered 2015-07-14 – 2015-07-17 (×6): 1 mg via RESPIRATORY_TRACT
  Filled 2015-07-14 (×6): qty 4

## 2015-07-14 MED ORDER — INDAPAMIDE 2.5 MG PO TABS
2.5000 mg | ORAL_TABLET | Freq: Every day | ORAL | Status: DC
  Administered 2015-07-14 – 2015-07-17 (×4): 2.5 mg via ORAL
  Filled 2015-07-14 (×5): qty 1

## 2015-07-14 MED ORDER — MONTELUKAST SODIUM 10 MG PO TABS
10.0000 mg | ORAL_TABLET | Freq: Every day | ORAL | Status: DC
  Administered 2015-07-14 – 2015-07-16 (×3): 10 mg via ORAL
  Filled 2015-07-14 (×3): qty 1

## 2015-07-14 NOTE — Progress Notes (Signed)
Patient Demographics  [name], is a 81 y.o. female, DOB - [birth year], PQZ:[id number]  Admit date - 07/12/2015   Admitting Physician Janece Canterbury, MD  Outpatient Primary MD for the patient is Pcp Not In System  LOS - 2   Chief Complaint  Patient presents with  . Leg Pain         Subjective:   Alyshia Koury today has, No headache, No chest pain, No abdominal pain - No Nausea, No Cough - SOB.   Assessment & Plan    Principal Problem:   Cellulitis of right leg Active Problems:   Diabetes mellitus type 2 with peripheral artery disease   Essential hypertension   Coronary atherosclerosis   Aortic valve regurgitation   COPD (chronic obstructive pulmonary disease)   Ulcer of right lower leg   Hyponatremia   Chronic diastolic heart failure  Right lower extremity ulceration, cellulitis. - She has failed outpatient Bactrim and Augmentin, possible PAD contributing to it,. - Patient was unable to tolerate ABI and right lower extremity - Continue vancomycin and ceftriaxone for wound coverage - Wound care consult appreciated, continue with wound care. - Requested vascular surgery consult to evaluate for PAD contributing to her ulcer..  Hyponatremia,  - Most likely related to diuresis and volume depletion, resolved with gentle hydration. - TSH and cortisol level within normal limits  Mildly elevated creatinine - Resolved, resume ARB   Aortic valve regurgitation/aortic insufficiency,  - moderate regurgitation demonstrated on echocardiogram from 2013, EF 55-60% with moderate LVH  Chronic diastolic heart failure - Currently appears to be euvolemic - will resume on Lasix and indapamide .  CAD, -  patient is chest pain free - ASA, high dose statin, CCB. Not on BB currently  HTN/HLD, - Acceptable - Continue ARB, CCB   Diabetes mellitus type 2 - CBG acceptable - Hold oral  medications - Low dose SSI and HS insulin  COPD,  - stable, no active wheezing - continue Advair  Code Status:Full  Family Communication: None at bedside, daughter a message  Disposition Plan: PT evaluation pending   Procedures  None   Consults   Requested vascular surgery consult   Medications  Scheduled Meds: . aspirin EC  81 mg Oral Daily  . budesonide (PULMICORT) nebulizer solution  1 mg Nebulization BID  . collagenase   Topical BID  . diclofenac sodium  4 g Topical TID AC & HS  . docusate sodium  100 mg Oral BID  . enoxaparin (LOVENOX) injection  40 mg Subcutaneous Q24H  . Influenza vac split quadrivalent PF  0.5 mL Intramuscular Tomorrow-1000  . insulin aspart  0-9 Units Subcutaneous TID WC  . irbesartan  300 mg Oral Daily  . multivitamin with minerals  1 tablet Oral Daily  . NIFEdipine  60 mg Oral Daily  . pantoprazole  40 mg Oral Daily  . pneumococcal 23 valent vaccine  0.5 mL Intramuscular Tomorrow-1000  . rosuvastatin  10 mg Oral Daily  . senna  1 tablet Oral BID  . vancomycin  750 mg Intravenous Q24H   Continuous Infusions:   PRN Meds:.acetaminophen **OR** acetaminophen, albuterol, bisacodyl, nitroGLYCERIN, oxyCODONE, polyethylene glycol  DVT Prophylaxis  Lovenox -  Lab Results  Component Value Date   PLT  258 07/13/2015    Antibiotics    Anti-infectives    Start     Dose/Rate Route Frequency Ordered Stop   07/14/15 2230  cefTRIAXone (ROCEPHIN) 1 g in dextrose 5 % 50 mL IVPB  Status:  Discontinued     1 g 100 mL/hr over 30 Minutes Intravenous  Once 07/13/15 0251 07/13/15 0303   07/13/15 2230  cefTRIAXone (ROCEPHIN) 1 g in dextrose 5 % 50 mL IVPB     1 g 100 mL/hr over 30 Minutes Intravenous  Once 07/13/15 0303 07/13/15 2156   07/13/15 2100  vancomycin (VANCOCIN) IVPB 750 mg/150 ml premix  Status:  Discontinued     750 mg 150 mL/hr over 60 Minutes Intravenous  Once 07/13/15 0303 07/13/15 1453   07/13/15 2100  vancomycin (VANCOCIN) IVPB 750  mg/150 ml premix     750 mg 150 mL/hr over 60 Minutes Intravenous Every 24 hours 07/13/15 1453     07/12/15 2045  cefTRIAXone (ROCEPHIN) 1 g in dextrose 5 % 50 mL IVPB     1 g 100 mL/hr over 30 Minutes Intravenous  Once 07/12/15 2043 07/12/15 2314   07/12/15 2045  vancomycin (VANCOCIN) IVPB 1000 mg/200 mL premix     1,000 mg 200 mL/hr over 60 Minutes Intravenous  Once 07/12/15 2043 07/12/15 2242          Objective:   Filed Vitals:   07/13/15 2054 07/14/15 0438 07/14/15 0848 07/14/15 1000  BP: 149/65 173/63  143/75  Pulse: 81 97  75  Temp: 98.5 F (36.9 C) 97.7 F (36.5 C)  98 F (36.7 C)  TempSrc: Oral Oral  Oral  Resp: '18 18  18  '$ Weight: 77.5 kg (170 lb 13.7 oz)     SpO2: 95% 90% 93% 95%    Wt Readings from Last 3 Encounters:  07/13/15 77.5 kg (170 lb 13.7 oz)  04/08/15 80.74 kg (178 lb)  10/04/14 78.926 kg (174 lb)     Intake/Output Summary (Last 24 hours) at 07/14/15 1200 Last data filed at 07/14/15 0900  Gross per 24 hour  Intake    840 ml  Output   1300 ml  Net   -460 ml     Physical Exam  Awake Alert,  [place].AT,PERRAL Supple Neck,No JVD, No cervical lymphadenopathy appriciated.  Symmetrical Chest wall movement, Good air movement bilaterally,  RRR,No Gallops,Rubs or new Murmurs, No Parasternal Heave +ve B.Sounds, Abd Soft, No tenderness, No organomegaly appriciated, No rebound - guarding or rigidity. No Cyanosis, Clubbing or edema, right skin bandage at the area of ulceration   Data Review   Micro Results No results found for this or any previous visit (from the past 240 hour(s)).  Radiology Reports Dg Tibia/fibula Right  07/12/2015   CLINICAL DATA:  81 year old female with open wound and the anterior aspect of the right tibia fibula.  EXAM: RIGHT TIBIA AND FIBULA - 2 VIEW  COMPARISON:  Radiograph dated 09/26/2009  FINDINGS: There is no acute fracture or dislocation. The bones are osteopenic. There is a focal defect in the anterior skin in the distal  aspect of the right leg corresponding to the known ulcer. No radiopaque foreign object identified.  IMPRESSION: Skin ulcer over the anterior distal leg.  No acute osseous abnormality.   Electronically Signed   By: Anner Crete M.D.   On: 07/12/2015 22:21     CBC  Recent Labs Lab 07/07/15 2106 07/12/15 2105 07/13/15 0605  WBC 7.0 5.6 4.6  HGB 11.7* 10.6* 11.1*  HCT 37.1 32.1* 34.1*  PLT 242 229 258  MCV 94.2 90.9 91.4  MCH 29.7 30.0 29.8  MCHC 31.5 33.0 32.6  RDW 13.4 13.4 13.3  LYMPHSABS 1.4 0.8  --   MONOABS 0.6 0.5  --   EOSABS 0.1 0.0  --   BASOSABS 0.0 0.0  --     Chemistries   Recent Labs Lab 07/07/15 2106 07/12/15 2105 07/13/15 0605 07/14/15 0432  NA 138 125* 135 135  K 3.5 3.9 4.3 4.1  CL 99* 92* 100* 98*  CO2 '31 23 26 29  '$ GLUCOSE 169* 154* 93 167*  BUN 31* 33* 22* 18  CREATININE 0.86 1.18* 0.96 0.84  CALCIUM 9.1 8.5* 9.2 8.9  AST  --  30  --   --   ALT  --  18  --   --   ALKPHOS  --  79  --   --   BILITOT  --  0.5  --   --    ------------------------------------------------------------------------------------------------------------------ estimated creatinine clearance is 47.2 mL/min (by C-G formula based on Cr of 0.84). ------------------------------------------------------------------------------------------------------------------ No results for input(s): HGBA1C in the last 72 hours. ------------------------------------------------------------------------------------------------------------------ No results for input(s): CHOL, HDL, LDLCALC, TRIG, CHOLHDL, LDLDIRECT in the last 72 hours. ------------------------------------------------------------------------------------------------------------------  Recent Labs  07/13/15 0117  TSH 1.792   ------------------------------------------------------------------------------------------------------------------ No results for input(s): VITAMINB12, FOLATE, FERRITIN, TIBC, IRON, RETICCTPCT in the last 72  hours.  Coagulation profile No results for input(s): INR, PROTIME in the last 168 hours.  No results for input(s): DDIMER in the last 72 hours.  Cardiac Enzymes No results for input(s): CKMB, TROPONINI, MYOGLOBIN in the last 168 hours.  Invalid input(s): CK ------------------------------------------------------------------------------------------------------------------ Invalid input(s): POCBNP     Time Spent in minutes   35 Minutes   ELGERGAWY, DAWOOD M.D on 07/14/2015 at 12:00 PM  Between 7am to 7pm - Pager - [phone]  After 7pm go to [email or web address] - password [facility]  [facility]   Office  [phone]

## 2015-07-14 NOTE — Consult Note (Signed)
[facility] NOTE   MRN : [id number]  Reason for Consult: Right anterior leg wound   History of Present Illness: 81 y/o female brought to ED secondary to anterior wound, edema and pain.  The wound was secondary to hitting a piece of furniture about 30 day ago.  She has been on oral antibiotics starting with Keflex on 05/30/2015 and trial of others.  Her daughter is the historian and states she wanted her in the hospital because she was not getting better and she thought it was getting worse.  She has an appointment with the wound center and was taking Lasix for the edema.  Past medical history includes: coronary artery disease on 81 mg Aspirin daily, aortic insufficiency, thoracic aortic aneurysm, diabetes mellitus type 2 managed with PO medications, hypertension managed with Benicar, hyperlipidemia managed with Lipitor, COPD, history of carcinoid syndrome status post resection of carcinoid tumor.     Current Facility-Administered Medications  Medication Dose Route Frequency Provider Last Rate Last Dose  . acetaminophen (TYLENOL) tablet 650 mg  650 mg Oral Q6H PRN Janece Canterbury, MD   650 mg at 07/13/15 0314   Or  . acetaminophen (TYLENOL) suppository 650 mg  650 mg Rectal Q6H PRN Janece Canterbury, MD      . albuterol (PROVENTIL) (2.5 MG/3ML) 0.083% nebulizer solution 2.5 mg  2.5 mg Inhalation Q4H PRN Janece Canterbury, MD      . aspirin EC tablet 81 mg  81 mg Oral Daily Janece Canterbury, MD   81 mg at 07/14/15 1109  . bisacodyl (DULCOLAX) suppository 10 mg  10 mg Rectal Daily PRN Janece Canterbury, MD      . budesonide (PULMICORT) nebulizer solution 1 mg  1 mg Nebulization BID Albertine Patricia, MD      . collagenase (SANTYL) ointment   Topical BID Janece Canterbury, MD      . diclofenac sodium (VOLTAREN) 1 % transdermal gel 4 g  4 g Topical TID AC & HS Janece Canterbury, MD   4 g at 07/13/15 1124  . docusate sodium (COLACE) capsule 100 mg  100 mg Oral BID Janece Canterbury, MD   100 mg at 07/14/15 1110  . enoxaparin (LOVENOX) injection 40 mg  40 mg Subcutaneous Q24H Janece Canterbury, MD   40 mg at 07/14/15 1110  . indapamide (LOZOL) tablet 2.5 mg  2.5 mg Oral Daily Albertine Patricia, MD   2.5 mg at 07/14/15 1344  . Influenza vac split quadrivalent PF (FLUARIX) injection 0.5 mL  0.5 mL Intramuscular Tomorrow-1000 Janece Canterbury, MD      . insulin aspart (novoLOG) injection 0-9 Units  0-9 Units Subcutaneous TID WC Janece Canterbury, MD   2 Units at 07/14/15 1242  . irbesartan (AVAPRO) tablet 300 mg  300 mg Oral Daily Albertine Patricia, MD   300 mg at 07/14/15 1343  . montelukast (SINGULAIR) tablet 10 mg  10 mg Oral QHS Silver Huguenin Elgergawy, MD      . multivitamin with minerals tablet 1 tablet  1 tablet Oral Daily Janece Canterbury, MD   1 tablet at 07/14/15 1109  . NIFEdipine (PROCARDIA-XL/ADALAT CC) 24 hr tablet 60 mg  60 mg Oral Daily Janece Canterbury, MD   60 mg at 07/14/15 1343  . nitroGLYCERIN (NITROSTAT) SL tablet 0.4 mg  0.4 mg Sublingual Q5 min PRN Janece Canterbury, MD      . oxyCODONE (Oxy IR/ROXICODONE) immediate release tablet 5-10 mg  5-10 mg Oral Q4H PRN Marcello Moores  Riccardo Dubin, NP   10 mg at 07/14/15 1259  . pantoprazole (PROTONIX) EC tablet 40 mg  40 mg Oral Daily Albertine Patricia, MD   40 mg at 07/14/15 1343  . pneumococcal 23 valent vaccine (PNU-IMMUNE) injection 0.5 mL  0.5 mL Intramuscular Tomorrow-1000 Janece Canterbury, MD      . polyethylene glycol (MIRALAX / GLYCOLAX) packet 17 g  17 g Oral Daily PRN Janece Canterbury, MD      . rosuvastatin (CRESTOR) tablet 10 mg  10 mg Oral Daily Janece Canterbury, MD   10 mg at 07/14/15 1109  . senna (SENOKOT) tablet 8.6 mg  1 tablet Oral BID Janece Canterbury, MD   8.6 mg at 07/14/15 1109  . vancomycin (VANCOCIN) IVPB 750 mg/150 ml premix  750 mg Intravenous Q24H Albertine Patricia, MD   750 mg at 07/13/15 2127    Pt meds include: Statin :Yes Betablocker: No ASA: Yes Other anticoagulants/antiplatelets:  none  Past Medical History  Diagnosis Date  . Diabetes mellitus   . Hypertension   . Coronary artery disease   . Aortic insufficiency   . Hyperlipidemia   . Aortic aneurysm   . Hypercholesterolemia   . COPD (chronic obstructive pulmonary disease)   . Bronchitis   . Edema   . GERD (gastroesophageal reflux disease)   . Aortic valve disease     stable  . Pain in joint, forearm   . Pain in joint, hand   . Pain in joint, lower leg   . Difficulty in walking(719.7)   . Disturbance of skin sensation   . Cancer     Past Surgical History  Procedure Laterality Date  . Pars plana vitrectomy      right eye  . Posterior capsulectomy      right eye  . Pars plana vitrectomy w/ endophotocoagulation      right eye  . Fiberoptic bronch with endobronchial u/s  07/02/2010  . Video bronchoscopy  12/09/2009  . 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 Bronch with endobronchial biopies  09/15/2009  . Colonoscopy N/A 09/13/2013    Procedure: COLONOSCOPY;  Surgeon: Jeryl Columbia, MD;  Location: [facility];  Service: Endoscopy;  Laterality: N/A;    Social History Social History  Substance Use Topics  . Smoking status: Never Smoker   . Smokeless tobacco: Never Used  . Alcohol Use: No    Family History Family History  Problem Relation Age of Onset  . Hypertension Mother   . Hypertension Father     No Known Allergies   REVIEW OF SYSTEMS  General: '[ ]'$  Weight loss, '[ ]'$  Fever, '[ ]'$  chills Neurologic: '[ ]'$  Dizziness, '[ ]'$  Blackouts, '[ ]'$  Seizure '[ ]'$  Stroke, '[ ]'$  "Mini stroke", '[ ]'$  Slurred speech, '[ ]'$  Temporary blindness; '[ ]'$  weakness in arms or legs, '[ ]'$  Hoarseness '[ ]'$  Dysphagia Cardiac: '[ ]'$  Chest pain/pressure, '[ ]'$  Shortness of breath at rest [x ] Shortness of breath with exertion, '[ ]'$  Atrial fibrillation or irregular heartbeat  Vascular: '[ ]'$  Pain in legs with walking, '[ ]'$  Pain in legs at rest, '[ ]'$  Pain in legs at night,  [x ] Non-healing ulcer, [  ] Blood clot in vein/DVT,   Pulmonary: '[ ]'$  Home oxygen, '[ ]'$  Productive cough, '[ ]'$  Coughing up blood, '[ ]'$  Asthma,  '[ ]'$  Wheezing '[ ]'$  COPD Musculoskeletal:  '[ ]'$  Arthritis, '[ ]'$  Low back pain, '[ ]'$  Joint pain  Hematologic: '[ ]'$  Easy Bruising, '[ ]'$  Anemia; '[ ]'$  Hepatitis Gastrointestinal: '[ ]'$  Blood in stool, '[ ]'$  Gastroesophageal Reflux/heartburn, Urinary: '[ ]'$  chronic Kidney disease, '[ ]'$  on HD - '[ ]'$  MWF or '[ ]'$  TTHS, '[ ]'$  Burning with urination, '[ ]'$  Difficulty urinating Skin: '[ ]'$  Rashes, [x ] Wounds Psychological: '[ ]'$  Anxiety, '[ ]'$  Depression  Physical Examination Filed Vitals:   07/13/15 2054 07/14/15 0438 07/14/15 0848 07/14/15 1000  BP: 149/65 173/63  143/75  Pulse: 81 97  75  Temp: 98.5 F (36.9 C) 97.7 F (36.5 C)  98 F (36.7 C)  TempSrc: Oral Oral  Oral  Resp: '18 18  18  '$ Weight: 170 lb 13.7 oz (77.5 kg)     SpO2: 95% 90% 93% 95%   Body mass index is 34.49 kg/(m^2).  General:  WDWN in NAD HENT: WNL Eyes: Pupils equal Pulmonary: normal non-labored breathing , without Rales, rhonchi,  wheezing Cardiac: RRR, without  Murmurs, rubs or gallops; No carotid bruits Abdomen: soft, NT, no masses Skin: Right anterior shin ulcer  3.8cm x 3cm with depth unable to be determined, no active drainage and no purulent draiange.   Vascular Exam/Pulses:1+ DP right, doppler PT, left 1-2+ palpable, palpable popliteal bilateral   Musculoskeletal: no muscle wasting or atrophy; no edema  Neurologic: A&O X 3; Appropriate Affect ;  SENSATION: normal; MOTOR FUNCTION: 4/5 Symmetric Speech is fluent/normal   Significant Diagnostic Studies: CBC Lab Results  Component Value Date   WBC 4.6 07/13/2015   HGB 11.1* 07/13/2015   HCT 34.1* 07/13/2015   MCV 91.4 07/13/2015   PLT 258 07/13/2015    BMET    Component Value Date/Time   NA 135 07/14/2015 0432   NA 141 08/05/2014 1051   NA 144 12/02/2011 1053   K 4.1 07/14/2015 0432   K 4.2 08/05/2014 1051   K 3.7 12/02/2011 1053   CL 98* 07/14/2015 0432    CL 104 07/07/2012 1435   CL 100 12/02/2011 1053   CO2 29 07/14/2015 0432   CO2 29 08/05/2014 1051   CO2 30 12/02/2011 1053   GLUCOSE 167* 07/14/2015 0432   GLUCOSE 156* 08/05/2014 1051   GLUCOSE 136* 07/07/2012 1435   GLUCOSE 164* 12/02/2011 1053   BUN 18 07/14/2015 0432   BUN 23.1 08/05/2014 1051   BUN 20 12/02/2011 1053   CREATININE 0.84 07/14/2015 0432   CREATININE 0.8 08/05/2014 1051   CREATININE 0.60 05/29/2014 1543   CALCIUM 8.9 07/14/2015 0432   CALCIUM 9.5 08/05/2014 1051   CALCIUM 9.1 12/02/2011 1053   GFRNONAA >60 07/14/2015 0432   GFRAA >60 07/14/2015 0432   Estimated Creatinine Clearance: 47.2 mL/min (by C-G formula based on Cr of 0.84).  COAG Lab Results  Component Value Date   INR 1.06 07/01/2010   INR 1.07 12/04/2009   INR 1.07 10/02/2009     Non-Invasive Vascular Imaging:  ABI completed on left 0.76 Right not completed secondary to pain  ASSESSMENT/PLAN:  Venous stasis ulcer Right LE edema continue collagenase (santyl) to dissolve enzymatically this eschar She is currently on IV vancomycin, WBC 4.6 today, afebrile  She has palpable pulses left > right.  She is not at risk of limb loss.  This appears venous and will take a long time to heal once she has healed the ulcer she will need to wear compression hose daily to prevent further ulcer and elevation of her legs when at rest.   Theda Sers, EMMA [facility] 07/14/2015 2:21 PM  I have examined the patient, reviewed and agree with above. Spoke with the patient's daughter and the patient. The patient understands very little [place] and the daughter is acting as Optometrist. Daughter is very frustrated that the difficulty in healing her ulceration. He has been seen several times in the emergency room and placed on a  different anti-biotics.  She does have easily palpable popliteal pulse and 1+ dorsalis pedis pulse in the right. She did not tolerate noninvasive studies on her right leg due to discomfort. She did  have triphasic waveforms on the left lower extremity. I do not feel that she needs any further vascular workup. She does have a palpable dorsalis pedis pulse and should have adequate arterial flow to heal this. She does have chronic edema and may benefit from compression if she is able to tolerate this once the wound is healed. She does have a great deal of necrotic debris at the base of the wound and may benefit from plastic surgical consultation to determine if surgical debridement is warranted. Will not follow actively. Please call if we can assist  Curt Jews, MD 07/14/2015 3:36 PM

## 2015-07-15 LAB — GLUCOSE, CAPILLARY
GLUCOSE-CAPILLARY: 122 mg/dL — AB (ref 65–99)
GLUCOSE-CAPILLARY: 199 mg/dL — AB (ref 65–99)
Glucose-Capillary: 152 mg/dL — ABNORMAL HIGH (ref 65–99)
Glucose-Capillary: 181 mg/dL — ABNORMAL HIGH (ref 65–99)

## 2015-07-15 MED ORDER — HYDRALAZINE HCL 20 MG/ML IJ SOLN
5.0000 mg | Freq: Once | INTRAMUSCULAR | Status: AC
  Administered 2015-07-15: 5 mg via INTRAVENOUS
  Filled 2015-07-15: qty 1

## 2015-07-15 NOTE — Progress Notes (Signed)
Received phone call from patients daughter upset stating that she wanted the doctor to be available to see her and the patient at 68 when she arrives. Stated that no one is doing anything about her mother's leg pain and that the dressings have not been changed. Explained to pts daughter that the nurse was given direction this morning to leave the dressing off by the MD. And that the [facility] RN had seen the patient but had signed off. Pts daughter upset stating that the [facility] RN should see patient daily. Attempted to explain which MDs had seen patient etc. However unable to provide a favorable answer to meet pts daughters expectations at this time. Kiyra Slaubaugh, Bryn Gulling

## 2015-07-15 NOTE — Progress Notes (Signed)
Received phone call from patients daughter upset and stating that "doctor has not come to see my mama". Explained to pts daughter that the MD has seen patient each day. Stated that she wanted to talk to the doctor or see him when she got here around 240. Informed her that RN would call MD to speak with her when she arrived if she desired. Caple, Bryn Gulling.

## 2015-07-15 NOTE — Progress Notes (Addendum)
Patient Demographics  [name], is a 81 y.o. female, DOB - 04-Jun-1932, DHR:[id number]  Admit date - 07/12/2015   Admitting Physician Janece Canterbury, MD  Outpatient Primary MD for the patient is Pcp Not In System  LOS - 3   Chief Complaint  Patient presents with  . Leg Pain         Subjective:   [name] today has, No headache, No chest pain, No abdominal pain - No Nausea, No Cough - SOB. Complains of pain at ulcer site.  Assessment & Plan    Principal Problem:   Cellulitis of right leg Active Problems:   Diabetes mellitus type 2 with peripheral artery disease   Essential hypertension   Coronary atherosclerosis   Aortic valve regurgitation   COPD (chronic obstructive pulmonary disease)   Ulcer of right lower leg   Hyponatremia   Chronic diastolic heart failure  Right lower extremity ulceration, cellulitis. - She has failed outpatient Bactrim and Augmentin,  - Patient was unable to tolerate ABI and right lower extremity - Initially on  vancomycin and Rocephin for wound coverage, afebrile, leukocytosis, wound does not look infected, will stop antibiotics.  - Wound care consult appreciated, continue with Santyl,  - Daughter scheduled appointment this coming Monday with wound care clinic - vascular surgery consult appreciated, wound not related to PAD, most likely venous. - Be evaluated by Dr. Migdalia Dk plastic surgery if debridement is needed.   hyponatremia - Most likely related to diuresis and volume depletion, resolved with gentle hydration. - TSH and cortisol level within normal limits  Mildly elevated creatinine - Resolved, resumed ARB   Aortic valve regurgitation/aortic insufficiency,  - moderate regurgitation demonstrated on echocardiogram from 2013, EF 55-60% with moderate LVH  Chronic diastolic heart failure - Currently appears to be euvolemic -resumed on Lasix  and indapamide .  CAD, -  patient is chest pain free - ASA, high dose statin, CCB. Not on BB currently  HTN/HLD, - Acceptable - Continue ARB, CCB   Diabetes mellitus type 2 - CBG acceptable - Hold oral medications - Low dose SSI and HS insulin  COPD,  - stable, no active wheezing - continue Advair  Code Status:Full  Family Communication; discussed with daughter at bedside  Disposition Plan:  home in 24 hours   Procedures  None   Consults vascular surgery Plastic surgery pending   Medications  Scheduled Meds: . aspirin EC  81 mg Oral Daily  . budesonide (PULMICORT) nebulizer solution  1 mg Nebulization BID  . collagenase   Topical BID  . diclofenac sodium  4 g Topical TID AC & HS  . docusate sodium  100 mg Oral BID  . enoxaparin (LOVENOX) injection  40 mg Subcutaneous Q24H  . indapamide  2.5 mg Oral Daily  . Influenza vac split quadrivalent PF  0.5 mL Intramuscular Tomorrow-1000  . insulin aspart  0-9 Units Subcutaneous TID WC  . irbesartan  300 mg Oral Daily  . montelukast  10 mg Oral QHS  . multivitamin with minerals  1 tablet Oral Daily  . NIFEdipine  60 mg Oral Daily  . pantoprazole  40 mg Oral Daily  . pneumococcal 23 valent vaccine  0.5 mL Intramuscular Tomorrow-1000  . rosuvastatin  10  mg Oral Daily  . senna  1 tablet Oral BID   Continuous Infusions:   PRN Meds:.acetaminophen **OR** acetaminophen, albuterol, bisacodyl, nitroGLYCERIN, oxyCODONE, polyethylene glycol  DVT Prophylaxis  Lovenox -  Lab Results  Component Value Date   PLT 258 07/13/2015    Antibiotics    Anti-infectives    Start     Dose/Rate Route Frequency Ordered Stop   07/14/15 2230  cefTRIAXone (ROCEPHIN) 1 g in dextrose 5 % 50 mL IVPB  Status:  Discontinued     1 g 100 mL/hr over 30 Minutes Intravenous  Once 07/13/15 0251 07/13/15 0303   07/13/15 2230  cefTRIAXone (ROCEPHIN) 1 g in dextrose 5 % 50 mL IVPB     1 g 100 mL/hr over 30 Minutes Intravenous  Once 07/13/15  0303 07/13/15 2156   07/13/15 2100  vancomycin (VANCOCIN) IVPB 750 mg/150 ml premix  Status:  Discontinued     750 mg 150 mL/hr over 60 Minutes Intravenous  Once 07/13/15 0303 07/13/15 1453   07/13/15 2100  vancomycin (VANCOCIN) IVPB 750 mg/150 ml premix  Status:  Discontinued     750 mg 150 mL/hr over 60 Minutes Intravenous Every 24 hours 07/13/15 1453 07/15/15 0930   07/12/15 2045  cefTRIAXone (ROCEPHIN) 1 g in dextrose 5 % 50 mL IVPB     1 g 100 mL/hr over 30 Minutes Intravenous  Once 07/12/15 2043 07/12/15 2314   07/12/15 2045  vancomycin (VANCOCIN) IVPB 1000 mg/200 mL premix     1,000 mg 200 mL/hr over 60 Minutes Intravenous  Once 07/12/15 2043 07/12/15 2242          Objective:   Filed Vitals:   07/14/15 2228 07/15/15 0550 07/15/15 0819 07/15/15 1000  BP:  192/57  126/69  Pulse: 79 68  92  Temp:  98.5 F (36.9 C)  98.2 F (36.8 C)  TempSrc:  Oral  Oral  Resp: '18 18  18  '$ Weight:  78.518 kg (173 lb 1.6 oz)    SpO2: 96% 94% 94% 96%    Wt Readings from Last 3 Encounters:  07/15/15 78.518 kg (173 lb 1.6 oz)  04/08/15 80.74 kg (178 lb)  10/04/14 78.926 kg (174 lb)     Intake/Output Summary (Last 24 hours) at 07/15/15 1539 Last data filed at 07/15/15 1300  Gross per 24 hour  Intake    720 ml  Output      0 ml  Net    720 ml     Physical Exam  Awake Alert,  [place].AT,PERRAL Supple Neck,No JVD, No cervical lymphadenopathy appriciated.  Symmetrical Chest wall movement, Good air movement bilaterally,  RRR,No Gallops,Rubs or new Murmurs, No Parasternal Heave +ve B.Sounds, Abd Soft, No tenderness, No organomegaly appriciated, No rebound - guarding or rigidity. No Cyanosis, Clubbing or edema, pulses felt, right anterior chin ulcer, no discharge or foul-smelling odor.   Data Review   Micro Results No results found for this or any previous visit (from the past 240 hour(s)).  Radiology Reports Dg Tibia/fibula Right  07/12/2015   CLINICAL DATA:  81 year old female  with open wound and the anterior aspect of the right tibia fibula.  EXAM: RIGHT TIBIA AND FIBULA - 2 VIEW  COMPARISON:  Radiograph dated 09/26/2009  FINDINGS: There is no acute fracture or dislocation. The bones are osteopenic. There is a focal defect in the anterior skin in the distal aspect of the right leg corresponding to the known ulcer. No radiopaque foreign object identified.  IMPRESSION: Skin ulcer  over the anterior distal leg.  No acute osseous abnormality.   Electronically Signed   By: Anner Crete M.D.   On: 07/12/2015 22:21     CBC  Recent Labs Lab 07/12/15 2105 07/13/15 0605  WBC 5.6 4.6  HGB 10.6* 11.1*  HCT 32.1* 34.1*  PLT 229 258  MCV 90.9 91.4  MCH 30.0 29.8  MCHC 33.0 32.6  RDW 13.4 13.3  LYMPHSABS 0.8  --   MONOABS 0.5  --   EOSABS 0.0  --   BASOSABS 0.0  --     Chemistries   Recent Labs Lab 07/12/15 2105 07/13/15 0605 07/14/15 0432  NA 125* 135 135  K 3.9 4.3 4.1  CL 92* 100* 98*  CO2 '23 26 29  '$ GLUCOSE 154* 93 167*  BUN 33* 22* 18  CREATININE 1.18* 0.96 0.84  CALCIUM 8.5* 9.2 8.9  AST 30  --   --   ALT 18  --   --   ALKPHOS 79  --   --   BILITOT 0.5  --   --    ------------------------------------------------------------------------------------------------------------------ estimated creatinine clearance is 47.5 mL/min (by C-G formula based on Cr of 0.84). ------------------------------------------------------------------------------------------------------------------  Recent Labs  07/13/15 0605  HGBA1C 6.8*   ------------------------------------------------------------------------------------------------------------------ No results for input(s): CHOL, HDL, LDLCALC, TRIG, CHOLHDL, LDLDIRECT in the last 72 hours. ------------------------------------------------------------------------------------------------------------------  Recent Labs  07/13/15 0117  TSH 1.792    ------------------------------------------------------------------------------------------------------------------ No results for input(s): VITAMINB12, FOLATE, FERRITIN, TIBC, IRON, RETICCTPCT in the last 72 hours.  Coagulation profile No results for input(s): INR, PROTIME in the last 168 hours.  No results for input(s): DDIMER in the last 72 hours.  Cardiac Enzymes No results for input(s): CKMB, TROPONINI, MYOGLOBIN in the last 168 hours.  Invalid input(s): CK ------------------------------------------------------------------------------------------------------------------ Invalid input(s): POCBNP     Time Spent in minutes   35 Minutes   ELGERGAWY, DAWOOD M.D on 07/15/2015 at 3:39 PM  Between 7am to 7pm - Pager - [phone]  After 7pm go to [email or web address] - password [email or web address]  [facility]   Office  [phone]

## 2015-07-15 NOTE — Progress Notes (Signed)
PT upset because I took her to the bathroom,and gave her pain pills.PT sated"I was under the nurse because the nurse does not take pt to bathroom only the nurse tech."Explain to pt I take pt's to bathroom,give baths,make beds,and do vital signs along with giving pills,and dressing changes and anything the patient needs I can do.PT stated "YOU LIE YOU ARE NOT THE NURSE,YOU R UNDER THE [place] calling up to the desk frequently,charge nurse trying to explain to pt that the nurse gave her the pills not the nurse tech.will continue to monitor

## 2015-07-15 NOTE — Care Management Important Message (Signed)
Important Message  Patient Details  Name: [name] MRN: [id number] Date of Birth: [birth year]   Medicare Important Message Given:  Yes-second notification given    Delorse Lek 07/15/2015, 11:28 AM

## 2015-07-15 NOTE — Evaluation (Signed)
Physical Therapy Evaluation Patient Details Name: LATRESHA YAHR MRN: [id number] DOB: 05-Oct-1932 Today's Date: 07/15/2015   History of Present Illness  The patient is a 81 y.o. year-old female with history of morbid obesity, coronary artery disease, aortic insufficiency, abdominal aortic aneurysm, diabetes mellitus type 2, hypertension, hyperlipidemia, COPD, history of carcinoid syndrome status post resection of carcinoid tumor who presents with nonhealing right lower extremity ulceration with progressive pain and swelling. The patient was last at their baseline health until about a month and a half ago. She states that she injured her right ankle on a dresser drawer about a month and half ago causing a large ulceration deformed. Since that time, she has had increased redness and swelling and pain. Over the last few days she has had increased pinkness of her leg, bloody purulent discharge from the ulcer, and increased pain. She has been on Bactrim and Augmentin which have not helped.  Clinical Impression   Pt admitted with above diagnosis. Pt currently with functional limitations due to the deficits listed below (see PT Problem List).  Pt will benefit from skilled PT to increase their independence and safety with mobility to allow discharge to the venue listed below.       Follow Up Recommendations No PT follow up;Supervision - Intermittent    Equipment Recommendations  Rolling walker with 5" wheels (short RW if pt doesn't already have one)    Recommendations for Other Services       Precautions / Restrictions Precautions Precautions: Fall Restrictions Weight Bearing Restrictions: No      Mobility  Bed Mobility Overal bed mobility: Needs Assistance Bed Mobility: Supine to Sit     Supine to sit: Supervision     General bed mobility comments: Slow movign, and used rails, but not needing assist  Transfers Overall transfer level: Needs assistance Equipment used: Rolling walker  (2 wheeled) Transfers: Sit to/from Stand Sit to Stand: Supervision         General transfer comment: Cues for hand placement; observed pt standing with use of UEs from commode, no overt problems  Ambulation/Gait Ambulation/Gait assistance: Supervision Ambulation Distance (Feet): 200 Feet Assistive device: Rolling walker (2 wheeled) Gait Pattern/deviations: Step-through pattern     General Gait Details: obtained shorter RW for optimal fit  Stairs            Wheelchair Mobility    Modified Rankin (Stroke Patients Only)       Balance Overall balance assessment: Needs assistance           Standing balance-Leahy Scale: Good Standing balance comment: noted a bit less Weight bearing RLE in standing                             Pertinent Vitals/Pain Pain Assessment: 0-10 Pain Score: 9  Pain Location: Wounds on anterior Lower leg Pain Descriptors / Indicators: Aching Pain Intervention(s): Limited activity within patient's tolerance;Monitored during session;Repositioned (pt mentioned pain was a little bett with wolking)    Home Living Family/patient expects to be discharged to:: Private residence Living Arrangements: Other relatives;Spouse/significant other Available Help at Discharge: Family Type of Home: House Home Access: Level entry (per phone interpreter)     Home Layout: One level (per phone interpreter, no steps within house)        Prior Function Level of Independence: [place]  Extremity/Trunk Assessment   Upper Extremity Assessment: Overall WFL for tasks assessed           Lower Extremity Assessment: RLE deficits/detail RLE Deficits / Details: Wounds on anterior aspect of lower leg (see WOC RN noted for specifics); pain with moving, but overall moving well       Communication   Communication: Prefers language other than English ([place], but speaks some [place])  Cognition  Arousal/Alertness: Awake/alert Behavior During Therapy: WFL for tasks assessed/performed;Impulsive Overall Cognitive Status: Within Functional Limits for tasks assessed (for simple mobility)                      General Comments  [employer] phone interpreter used, Amedeo Gory (lost her interpreter number)    Exercises        Assessment/Plan    PT Assessment Patient needs continued PT services  PT Diagnosis Acute pain   PT Problem List Decreased activity tolerance;Decreased mobility;Decreased knowledge of use of DME;Decreased knowledge of precautions;Pain  PT Treatment Interventions DME instruction;Gait training;Functional mobility training;Therapeutic activities;Therapeutic exercise;Patient/family education   PT Goals (Current goals can be found in the Care Plan section) Acute Rehab PT Goals Patient Stated Goal: agreeable to walk PT Goal Formulation: With patient Time For Goal Achievement: 07/22/15 Potential to Achieve Goals: Good    Frequency Min 3X/week (will likely meet goals within 1-2 more sessions)   Barriers to discharge        Co-evaluation               End of Session   Activity Tolerance: Patient tolerated treatment well Patient left: in chair;with call bell/phone within reach Nurse Communication: Mobility status         Time: 9242-6834 PT Time Calculation (min) (ACUTE ONLY): 45 min   Charges:   PT Evaluation $Initial PT Evaluation Tier I: 1 Procedure PT Treatments $Gait Training: 23-37 mins   PT G CodesQuin Hoop 07/15/2015, 11:19 AM  Roney Marion, [place] Pager [phone] Office [phone]

## 2015-07-16 ENCOUNTER — Encounter (HOSPITAL_COMMUNITY): Payer: Self-pay | Admitting: Plastic Surgery

## 2015-07-16 ENCOUNTER — Other Ambulatory Visit: Payer: Self-pay | Admitting: Plastic Surgery

## 2015-07-16 DIAGNOSIS — L97909 Non-pressure chronic ulcer of unspecified part of unspecified lower leg with unspecified severity: Secondary | ICD-10-CM

## 2015-07-16 DIAGNOSIS — L97912 Non-pressure chronic ulcer of unspecified part of right lower leg with fat layer exposed: Secondary | ICD-10-CM

## 2015-07-16 DIAGNOSIS — L03115 Cellulitis of right lower limb: Secondary | ICD-10-CM

## 2015-07-16 LAB — GLUCOSE, CAPILLARY
GLUCOSE-CAPILLARY: 161 mg/dL — AB (ref 65–99)
GLUCOSE-CAPILLARY: 197 mg/dL — AB (ref 65–99)
GLUCOSE-CAPILLARY: 180 mg/dL — AB (ref 65–99)
Glucose-Capillary: 265 mg/dL — ABNORMAL HIGH (ref 65–99)

## 2015-07-16 LAB — CBC WITH DIFFERENTIAL/PLATELET
BASOS ABS: 0 10*3/uL (ref 0.0–0.1)
Basophils Relative: 0 %
EOS ABS: 0 10*3/uL (ref 0.0–0.7)
EOS PCT: 1 %
HCT: 38.5 % (ref 36.0–46.0)
Hemoglobin: 12.4 g/dL (ref 12.0–15.0)
Lymphocytes Relative: 19 %
Lymphs Abs: 1.4 10*3/uL (ref 0.7–4.0)
MCH: 30.1 pg (ref 26.0–34.0)
MCHC: 32.2 g/dL (ref 30.0–36.0)
MCV: 93.4 fL (ref 78.0–100.0)
MONO ABS: 0.4 10*3/uL (ref 0.1–1.0)
Monocytes Relative: 5 %
Neutro Abs: 5.7 10*3/uL (ref 1.7–7.7)
Neutrophils Relative %: 75 %
PLATELETS: 301 10*3/uL (ref 150–400)
RBC: 4.12 MIL/uL (ref 3.87–5.11)
RDW: 13.2 % (ref 11.5–15.5)
WBC: 7.6 10*3/uL (ref 4.0–10.5)

## 2015-07-16 LAB — BASIC METABOLIC PANEL
Anion gap: 8 (ref 5–15)
BUN: 20 mg/dL (ref 6–20)
CALCIUM: 9.3 mg/dL (ref 8.9–10.3)
CO2: 31 mmol/L (ref 22–32)
CREATININE: 0.76 mg/dL (ref 0.44–1.00)
Chloride: 97 mmol/L — ABNORMAL LOW (ref 101–111)
GFR calc Af Amer: >60 mL/min (ref >60)
Glucose, Bld: 196 mg/dL — ABNORMAL HIGH (ref 65–99)
Potassium: 3.8 mmol/L (ref 3.5–5.1)
SODIUM: 136 mmol/L (ref 135–145)

## 2015-07-16 LAB — SEDIMENTATION RATE: Sed Rate: 35 mm/hr — ABNORMAL HIGH (ref 0–22)

## 2015-07-16 LAB — PREALBUMIN: Prealbumin: 27.2 mg/dL (ref 18–38)

## 2015-07-16 LAB — SODIUM, URINE, RANDOM: Sodium, Ur: 45 mmol/L

## 2015-07-16 LAB — OSMOLALITY, URINE: OSMOLALITY UR: 429 mosm/kg (ref 390–1090)

## 2015-07-16 LAB — C-REACTIVE PROTEIN: CRP: 1.4 mg/dL — AB (ref <1.0)

## 2015-07-16 MED ORDER — GLUCERNA SHAKE PO LIQD
237.0000 mL | Freq: Two times a day (BID) | ORAL | Status: DC
  Administered 2015-07-16 – 2015-07-17 (×2): 237 mL via ORAL

## 2015-07-16 NOTE — Care Management Note (Signed)
Case Management Note  Patient Details  Name: KAELIN HOLFORD MRN: [id number] Date of Birth: 02-10-1932  Subjective/Objective:           CM following for progression and d/c planning since adm on 07/12/15.         Action/Plan: CM consult for [facility] and DME, await clarification of actual needs. Also noted SW consult for assistance with meds, however this is managed by case management and this pt has Medicare and Medicaid so has significant insurance and would not qualify for further assistance.   Expected Discharge Date:                  Expected Discharge Plan:  [place]  In-House Referral:  NA  Discharge planning Services  CM Consult  Post Acute Care Choice:  Durable Medical Equipment Choice offered to:  Adult Children  DME Arranged:    DME Agency:     HH Arranged:    HH Agency:     Status of Service:  In process, will continue to follow  Medicare Important Message Given:  Yes-second notification given Date Medicare IM Given:    Medicare IM give by:    Date Additional Medicare IM Given:    Additional Medicare Important Message give by:     If discussed at [place] of Stay Meetings, dates discussed:    Additional Comments:  Adron Bene, RN 07/16/2015, 10:55 AM

## 2015-07-16 NOTE — Progress Notes (Signed)
Initial Nutrition Assessment  DOCUMENTATION CODES:   Obesity unspecified  INTERVENTION:   Provide Glucerna Shake po BID, each supplement provides 220 kcal and 10 grams of protein.  Encourage adequate PO intake.   NUTRITION DIAGNOSIS:   Increased nutrient needs related to wound healing as evidenced by estimated needs.  GOAL:   Patient will meet greater than or equal to 90% of their needs  MONITOR:   PO intake, Supplement acceptance, Weight trends, Labs, I & O's  REASON FOR ASSESSMENT:   Consult Wound healing  ASSESSMENT:   81 y.o. female. She has multiple medical problems including hypertension, coronary artery disease, chronic obstructive pulmonary disease, diabetes mellitus type 2 with peripheral artery disease and diastolic heart failure. She was admitted with cellulitis of her right leg and an ulcer.  Meal completion has been 100%. Pt reports appetite is fine with no other difficulties. Weight has been stable. Pt with no observed significant fat or muscle mass loss. RD to order nutritional supplements to aid in wound healing. Pt encouraged to eat her food at meals and to drink her supplements.   Labs and medications reviewed.   Diet Order:  Diet NPO time specified Carbohydrate modifed  Skin:  Wound (see comment) (Cellulitis R Leg with ulcer, +2 RLE, +1 LLE edema)  Last BM:  9/20  Height:   Ht Readings from Last 1 Encounters:  04/08/15 '4\' 11"'$  (1.499 m)    Weight:   Wt Readings from Last 1 Encounters:  07/15/15 176 lb 10.1 oz (80.12 kg)    Ideal Body Weight:  44.5 kg  BMI:  Body mass index is 35.66 kg/(m^2).  Estimated Nutritional Needs:   Kcal:  1800-2000  Protein:  95-105 grams  Fluid:  1.8-2 L/day  EDUCATION NEEDS:   No education needs identified at this time  Corrin Parker, MS, RD, LDN Pager # [phone] After hours/ weekend pager # [phone]

## 2015-07-16 NOTE — Consult Note (Signed)
Reason for Consult:leg ulcer Referring Physician: Dr. Emeline Gins Elgergawy  [name] is an 82 y.o. female. She has multiple medical problems including hypertension, coronary artery disease, chronic obstructive pulmonary disease, diabetes mellitus type 2 with peripheral artery disease and diastolic heart failure.  She was admitted with cellulitis of her right leg and an ulcer.  She had been on bactrim and augmentin.  Since in the hospital the cellulitis has improved.  The wound is on the lower 1/3 of the right anterior leg.  It is ~ 3 x 6 cm with necrotic tissue. It is not clear how long she has had the wound.      Past Medical History  Diagnosis Date  . Diabetes mellitus   . Hypertension   . Coronary artery disease   . Aortic insufficiency   . Hyperlipidemia   . Aortic aneurysm   . Hypercholesterolemia   . COPD (chronic obstructive pulmonary disease)   . Bronchitis   . Edema   . GERD (gastroesophageal reflux disease)   . Aortic valve disease     stable  . Pain in joint, forearm   . Pain in joint, hand   . Pain in joint, lower leg   . Difficulty in walking(719.7)   . Disturbance of skin sensation   . Cancer     Past Surgical History  Procedure Laterality Date  . Pars plana vitrectomy      right eye  . Posterior capsulectomy      right eye  . Pars plana vitrectomy w/ endophotocoagulation      right eye  . Fiberoptic bronch with endobronchial u/s  07/02/2010  . Video bronchoscopy  12/09/2009  . 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 Bronch with endobronchial biopies  09/15/2009  . Colonoscopy N/A 09/13/2013    Procedure: COLONOSCOPY;  Surgeon: Jeryl Columbia, MD;  Location: [facility];  Service: Endoscopy;  Laterality: N/A;    Family History  Problem Relation Age of Onset  . Hypertension Mother   . Hypertension Father     Social History:  reports that she has never smoked. She has never used smokeless tobacco. She reports  that she does not drink alcohol or use illicit drugs.  Allergies: No Known Allergies  Medications: I have reviewed the patient's current medications.  Results for orders placed or performed during the hospital encounter of 07/12/15 (from the past 48 hour(s))  Glucose, capillary     Status: Abnormal   Collection Time: 07/14/15 11:43 AM  Result Value Ref Range   Glucose-Capillary 159 (H) 65 - 99 mg/dL  Glucose, capillary     Status: Abnormal   Collection Time: 07/14/15  4:15 PM  Result Value Ref Range   Glucose-Capillary 199 (H) 65 - 99 mg/dL  Glucose, capillary     Status: Abnormal   Collection Time: 07/14/15  9:30 PM  Result Value Ref Range   Glucose-Capillary 160 (H) 65 - 99 mg/dL  Glucose, capillary     Status: Abnormal   Collection Time: 07/15/15  7:56 AM  Result Value Ref Range   Glucose-Capillary 122 (H) 65 - 99 mg/dL  Glucose, capillary     Status: Abnormal   Collection Time: 07/15/15 11:38 AM  Result Value Ref Range   Glucose-Capillary 199 (H) 65 - 99 mg/dL  Glucose, capillary     Status: Abnormal   Collection Time: 07/15/15  4:39 PM  Result Value Ref Range   Glucose-Capillary 152 (H) 65 -  99 mg/dL  Glucose, capillary     Status: Abnormal   Collection Time: 07/15/15  9:48 PM  Result Value Ref Range   Glucose-Capillary 181 (H) 65 - 99 mg/dL  Sodium, urine, random     Status: None   Collection Time: 07/15/15 11:47 PM  Result Value Ref Range   Sodium, Ur 45 mmol/L    No results found.  ROS Blood pressure 167/55, pulse 67, temperature 97.7 F (36.5 C), temperature source Oral, resp. rate 20, weight 80.12 kg (176 lb 10.1 oz), SpO2 96 %. Physical Exam  Assessment/Plan: Recommend checking a prealbumin, maximizing nutrition status with consult from nutrition and diabetic educator, Multivitamin, Vit C and Zinc daily, OR for debridement and ACell/VAC placement.  Elevation of the leg and compression.  Wallace Going 07/16/2015, 7:31 AM

## 2015-07-16 NOTE — Progress Notes (Signed)
Inpatient Diabetes Program Recommendations  AACE/ADA: New Consensus Statement on Inpatient Glycemic Control (2015)  Target Ranges:  Prepandial:   less than 140 mg/dL      Peak postprandial:   less than 180 mg/dL (1-2 hours)      Critically ill patients:  140 - 180 mg/dL    Results for [name], [name] (MRN [id number]) as of 07/16/2015 14:29  Ref. Range 07/15/2015 07:56 07/15/2015 11:38 07/15/2015 16:39 07/15/2015 21:48  Glucose-Capillary Latest Ref Range: 65-99 mg/dL 122 (H) 199 (H) 152 (H) 181 (H)    Results for [name], [name] (MRN [id number]) as of 07/16/2015 14:29  Ref. Range 07/13/2015 06:05  Hemoglobin A1C Latest Ref Range: 4.8-5.6 % 6.8 (H)    Admit LE Cellulitis  History: DM, COPD  Home DM Meds: Actos 15 mg daily        Amaryl 1 mg daily  Current Orders: Novolog Sensitive SSI (0-9 units) TID AC     -A1c 6.8% shows good glucose control prior to admission.  -CBGs stable thus far on SSI alone.    Will follow Wyn Quaker RN, MSN, CDE Diabetes Coordinator Inpatient Glycemic Control Team Team Pager: [phone] (8a-5p)

## 2015-07-16 NOTE — Progress Notes (Signed)
Patient Demographics  [name], is a 81 y.o. female, DOB - 02/03/1932, JOI:[id number]  Admit date - 07/12/2015   Admitting Physician Janece Canterbury, MD  Outpatient Primary MD for the patient is Pcp Not In System  LOS - 4   Chief Complaint  Patient presents with  . Leg Pain         Subjective:   [name] today has, No headache, No chest pain, No abdominal pain - No Nausea, No Cough - SOB. Complains of pain at ulcer site in the right leg.  Assessment & Plan    Right lower extremity ulceration, cellulitis. - Seen by vascular surgery and plastics, has finished antibiotic sent currently minimal to no evidence of cellulitis. Per vascular surgery this is a venous insufficiency ulcer, plastic surgery on board and planning to operate tomorrow. Continue supportive care. We'll get preoperative EKG. She should be a moderate risk for adverse cardiopulmonary outcome.   She does have history of aortic valve regurgitation and insufficiency along with chronic diastolic CHF with EF of 15% in 2013. Clinically appears compensated. No active chest pain or shortness of breath.    hyponatremia - Most likely related to diuresis and volume depletion, resolved with gentle hydration. - TSH and cortisol level within normal limits  Mildly elevated creatinine - Due to dehydration, Resolved, resumed ARB along with home dose Lasix.  Aortic valve regurgitation/aortic insufficiency,  - moderate regurgitation demonstrated on echocardiogram from 2013, EF 55-60% with moderate LVH, no acute issues appears compensated.  Chronic diastolic heart failure - Currently appears to be euvolemic -resumed on Lasix and indapamide .  CAD, -  patient is chest pain free - ASA, high dose statin, CCB. Not on BB currently  HTN/HLD, - Acceptable - Continue ARB, CCB   Diabetes mellitus type 2 - CBG acceptable - Hold oral  medications - Low dose SSI and HS insulin  CBG (last 3)   Recent Labs  07/15/15 2148 07/16/15 0807 07/16/15 1132  GLUCAP 181* 161* 197*     COPD,  - stable, no active wheezing - continue Advair    Code Status:Full  Family Communication; previous physician discussed with daughter at bedside  Disposition Plan:  home in 24 hours   Procedures  None   Consults vascular surgery Plastic surgery    Medications  Scheduled Meds: . aspirin EC  81 mg Oral Daily  . budesonide (PULMICORT) nebulizer solution  1 mg Nebulization BID  . collagenase   Topical BID  . diclofenac sodium  4 g Topical TID AC & HS  . docusate sodium  100 mg Oral BID  . enoxaparin (LOVENOX) injection  40 mg Subcutaneous Q24H  . indapamide  2.5 mg Oral Daily  . Influenza vac split quadrivalent PF  0.5 mL Intramuscular Tomorrow-1000  . insulin aspart  0-9 Units Subcutaneous TID WC  . irbesartan  300 mg Oral Daily  . montelukast  10 mg Oral QHS  . multivitamin with minerals  1 tablet Oral Daily  . NIFEdipine  60 mg Oral Daily  . pantoprazole  40 mg Oral Daily  . pneumococcal 23 valent vaccine  0.5 mL Intramuscular Tomorrow-1000  . rosuvastatin  10 mg Oral Daily  . senna  1 tablet Oral BID  Continuous Infusions:   PRN Meds:.acetaminophen **OR** acetaminophen, albuterol, bisacodyl, nitroGLYCERIN, oxyCODONE, polyethylene glycol  DVT Prophylaxis  Lovenox    Lab Results  Component Value Date   PLT 301 07/16/2015    Antibiotics    Anti-infectives    Start     Dose/Rate Route Frequency Ordered Stop   07/14/15 2230  cefTRIAXone (ROCEPHIN) 1 g in dextrose 5 % 50 mL IVPB  Status:  Discontinued     1 g 100 mL/hr over 30 Minutes Intravenous  Once 07/13/15 0251 07/13/15 0303   07/13/15 2230  cefTRIAXone (ROCEPHIN) 1 g in dextrose 5 % 50 mL IVPB     1 g 100 mL/hr over 30 Minutes Intravenous  Once 07/13/15 0303 07/13/15 2156   07/13/15 2100  vancomycin (VANCOCIN) IVPB 750 mg/150 ml premix  Status:   Discontinued     750 mg 150 mL/hr over 60 Minutes Intravenous  Once 07/13/15 0303 07/13/15 1453   07/13/15 2100  vancomycin (VANCOCIN) IVPB 750 mg/150 ml premix  Status:  Discontinued     750 mg 150 mL/hr over 60 Minutes Intravenous Every 24 hours 07/13/15 1453 07/15/15 0930   07/12/15 2045  cefTRIAXone (ROCEPHIN) 1 g in dextrose 5 % 50 mL IVPB     1 g 100 mL/hr over 30 Minutes Intravenous  Once 07/12/15 2043 07/12/15 2314   07/12/15 2045  vancomycin (VANCOCIN) IVPB 1000 mg/200 mL premix     1,000 mg 200 mL/hr over 60 Minutes Intravenous  Once 07/12/15 2043 07/12/15 2242          Objective:   Filed Vitals:   07/16/15 0443 07/16/15 0501 07/16/15 0745 07/16/15 0900  BP: 184/61 167/55  139/73  Pulse: 67   84  Temp: 97.7 F (36.5 C)   98.5 F (36.9 C)  TempSrc: Oral   Oral  Resp: 20   20  Weight:      SpO2: 89% 96% 94% 95%    Wt Readings from Last 3 Encounters:  07/15/15 80.12 kg (176 lb 10.1 oz)  04/08/15 80.74 kg (178 lb)  10/04/14 78.926 kg (174 lb)     Intake/Output Summary (Last 24 hours) at 07/16/15 1413 Last data filed at 07/16/15 1042  Gross per 24 hour  Intake    780 ml  Output   2250 ml  Net  -1470 ml     Physical Exam  Awake Alert,  [place].AT,PERRAL Supple Neck,No JVD, No cervical lymphadenopathy appriciated.  Symmetrical Chest wall movement, Good air movement bilaterally,  RRR,No Gallops,Rubs or new Murmurs, No Parasternal Heave +ve B.Sounds, Abd Soft, No tenderness, No organomegaly appriciated, No rebound - guarding or rigidity. No Cyanosis, Clubbing or edema, pulses felt, right anterior chin ulcer, no discharge or foul-smelling odor. Right lower extremity ulcer under bandage, present 2 cm above the right lateral malleolus. No surrounding cellulitis.  Data Review   Micro Results No results found for this or any previous visit (from the past 240 hour(s)).  Radiology Reports Dg Tibia/fibula Right  07/12/2015   CLINICAL DATA:  81 year old female  with open wound and the anterior aspect of the right tibia fibula.  EXAM: RIGHT TIBIA AND FIBULA - 2 VIEW  COMPARISON:  Radiograph dated 09/26/2009  FINDINGS: There is no acute fracture or dislocation. The bones are osteopenic. There is a focal defect in the anterior skin in the distal aspect of the right leg corresponding to the known ulcer. No radiopaque foreign object identified.  IMPRESSION: Skin ulcer over the anterior distal leg.  No acute osseous abnormality.   Electronically Signed   By: Anner Crete M.D.   On: 07/12/2015 22:21     CBC  Recent Labs Lab 07/12/15 2105 07/13/15 0605 07/16/15 1100  WBC 5.6 4.6 7.6  HGB 10.6* 11.1* 12.4  HCT 32.1* 34.1* 38.5  PLT 229 258 301  MCV 90.9 91.4 93.4  MCH 30.0 29.8 30.1  MCHC 33.0 32.6 32.2  RDW 13.4 13.3 13.2  LYMPHSABS 0.8  --  1.4  MONOABS 0.5  --  0.4  EOSABS 0.0  --  0.0  BASOSABS 0.0  --  0.0    Chemistries   Recent Labs Lab 07/12/15 2105 07/13/15 0605 07/14/15 0432 07/16/15 1100  NA 125* 135 135 136  K 3.9 4.3 4.1 3.8  CL 92* 100* 98* 97*  CO2 '23 26 29 31  '$ GLUCOSE 154* 93 167* 196*  BUN 33* 22* 18 20  CREATININE 1.18* 0.96 0.84 0.76  CALCIUM 8.5* 9.2 8.9 9.3  AST 30  --   --   --   ALT 18  --   --   --   ALKPHOS 79  --   --   --   BILITOT 0.5  --   --   --    ------------------------------------------------------------------------------------------------------------------ estimated creatinine clearance is 50.5 mL/min (by C-G formula based on Cr of 0.76). ------------------------------------------------------------------------------------------------------------------ No results for input(s): HGBA1C in the last 72 hours. ------------------------------------------------------------------------------------------------------------------ No results for input(s): CHOL, HDL, LDLCALC, TRIG, CHOLHDL, LDLDIRECT in the last 72  hours. ------------------------------------------------------------------------------------------------------------------ No results for input(s): TSH, T4TOTAL, T3FREE, THYROIDAB in the last 72 hours.  Invalid input(s): FREET3 ------------------------------------------------------------------------------------------------------------------ No results for input(s): VITAMINB12, FOLATE, FERRITIN, TIBC, IRON, RETICCTPCT in the last 72 hours.  Coagulation profile No results for input(s): INR, PROTIME in the last 168 hours.  No results for input(s): DDIMER in the last 72 hours.  Cardiac Enzymes No results for input(s): CKMB, TROPONINI, MYOGLOBIN in the last 168 hours.  Invalid input(s): CK ------------------------------------------------------------------------------------------------------------------ Invalid input(s): POCBNP     Time Spent in minutes   35 Minutes   SINGH,PRASHANT K M.D on 07/16/2015 at 2:13 PM  Between 7am to 7pm - Pager - [phone]  After 7pm go to [email or web address] - password [facility]  [facility]   Office  [phone]

## 2015-07-17 ENCOUNTER — Encounter (HOSPITAL_COMMUNITY): Payer: Medicare Other

## 2015-07-17 ENCOUNTER — Encounter (HOSPITAL_COMMUNITY)
Admission: EM | Disposition: A | Discharge status: Home Health | Payer: Self-pay | Source: Home / Self Care | Attending: Internal Medicine

## 2015-07-17 ENCOUNTER — Encounter (HOSPITAL_COMMUNITY): Payer: Self-pay | Admitting: Anesthesiology

## 2015-07-17 LAB — GLUCOSE, CAPILLARY
GLUCOSE-CAPILLARY: 150 mg/dL — AB (ref 65–99)
Glucose-Capillary: 149 mg/dL — ABNORMAL HIGH (ref 65–99)

## 2015-07-17 LAB — HEMOGLOBIN A1C
HEMOGLOBIN A1C: 7.1 % — AB (ref 4.8–5.6)
MEAN PLASMA GLUCOSE: 157 mg/dL

## 2015-07-17 LAB — HIV ANTIBODY (ROUTINE TESTING W REFLEX): HIV SCREEN 4TH GENERATION: NONREACTIVE

## 2015-07-17 SURGERY — IRRIGATION AND DEBRIDEMENT WOUND
Anesthesia: General | Laterality: Right

## 2015-07-17 MED ORDER — "DRESSING SPONGES 4""X4"" PADS": MEDICATED_PAD | Status: DC

## 2015-07-17 MED ORDER — FENTANYL CITRATE (PF) 250 MCG/5ML IJ SOLN
INTRAMUSCULAR | Status: AC
Start: 2015-07-17 — End: 2015-07-17
  Filled 2015-07-17: qty 5

## 2015-07-17 MED ORDER — PROPOFOL 10 MG/ML IV BOLUS
INTRAVENOUS | Status: AC
  Filled 2015-07-17: qty 20

## 2015-07-17 MED ORDER — MIDAZOLAM HCL 2 MG/2ML IJ SOLN
INTRAMUSCULAR | Status: AC
  Filled 2015-07-17: qty 4

## 2015-07-17 MED ORDER — ONDANSETRON HCL 4 MG/2ML IJ SOLN
INTRAMUSCULAR | Status: AC
  Filled 2015-07-17: qty 2

## 2015-07-17 MED ORDER — LIDOCAINE HCL (CARDIAC) 20 MG/ML IV SOLN
INTRAVENOUS | Status: AC
  Filled 2015-07-17: qty 5

## 2015-07-17 NOTE — Progress Notes (Signed)
RN contacts Dr. Migdalia Dk, who will be performing surgery for patient this morning. MD states she will come visit patient and will need an interpreter. RN will continue to monitor patient.   Ermalinda Memos, RN

## 2015-07-17 NOTE — Progress Notes (Signed)
~  845a Greeted at the nurses station by patients daughter who requested to speak with department leadership. Escorted pts daughter to patients room. Patients daughter and patient upset stating that the "surgeon who came this morning was a different lady than yesterday". Reviewed medical record and attempted to explain that the surgeon who spoke with them on yesterday was the same MD. Dr. Marla Roe. Daughter asked why the patient wasn't getting surgery. Reviewed the MD notes and explained that Dr. Marla Roe was not planning to do surgery due to the patient and daughter refusing to allow the MD to utilize the interpreter services to explain and communicate the plan of care.    Patient and daughter continue to state "you are lying". "The woman yesterday was not the same surgeon as today". Continued to attempt to explain with no resolve.   Pts daughter then stated "what do we do now?" "Doesn't the hospital have a doctor that knows what to do about my mamma's legs". Informed her that DD would speak with Dr. Candiss Norse to provide information regarding the plan going forward.   Discussion lasted about 40 minutes.  Caple, Bryn Gulling

## 2015-07-17 NOTE — Progress Notes (Addendum)
Dressing changed prior to discharged, done dailiy:  Wound care to right lateral LE ulcers (3): Cleanse with NS, pat gently dry. Apply collagenase (Santyl) ointment in a 1/8 inch layer, top with single saline moistened gauze 4x4 (opened). Cover with dry 4x4s and secure with tape. Explained to pt's daughter and pt how to change and cleanse dressing. Pt's daughter verbalized understanding   Alcide Evener A  07/17/2015 12:33 PM

## 2015-07-17 NOTE — Progress Notes (Signed)
Called to speak with Dr. Marla Roe regarding the patient and her mother's concerns. Dr. Marla Roe currently in [place]. Left message for RN at the office to call back. Caple, Bryn Gulling

## 2015-07-17 NOTE — Discharge Summary (Addendum)
[name], is a 81 y.o. female  DOB [birth year]  MRN [id number].  Admission date:  07/12/2015  Admitting Physician  Janece Canterbury, MD  Discharge Date:  07/17/2015   Primary MD  Pcp Not In System  Recommendations for primary care physician for things to follow:   Check CBC, BMP, monitor right leg wound closely. Outpatient plastic surgery consult.   Admission Diagnosis  Peripheral vascular disease [I73.9] Cellulitis of right lower extremity [L03.115] Ulcer of calf with fat layer exposed, right [L97.212]   Discharge Diagnosis  Peripheral vascular disease [I73.9] Cellulitis of right lower extremity [L03.115] Ulcer of calf with fat layer exposed, right [L97.212]     Principal Problem:   Cellulitis of right leg Active Problems:   Diabetes mellitus type 2 with peripheral artery disease   Essential hypertension   Coronary atherosclerosis   Aortic valve regurgitation   COPD (chronic obstructive pulmonary disease)   Ulcer of right lower leg   Hyponatremia   Chronic diastolic heart failure   Leg ulcer      Past Medical History  Diagnosis Date  . Diabetes mellitus   . Hypertension   . Coronary artery disease   . Aortic insufficiency   . Hyperlipidemia   . Aortic aneurysm   . Hypercholesterolemia   . COPD (chronic obstructive pulmonary disease)   . Bronchitis   . Edema   . GERD (gastroesophageal reflux disease)   . Aortic valve disease     stable  . Pain in joint, forearm   . Pain in joint, hand   . Pain in joint, lower leg   . Difficulty in walking(719.7)   . Disturbance of skin sensation   . Cancer     Past Surgical History  Procedure Laterality Date  . Pars plana vitrectomy      right eye  . Posterior capsulectomy      right eye  . Pars plana vitrectomy w/ endophotocoagulation     right eye  . Fiberoptic bronch with endobronchial u/s  07/02/2010  . Video bronchoscopy  12/09/2009  . 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 Bronch with endobronchial biopies  09/15/2009  . Colonoscopy N/A 09/13/2013    Procedure: COLONOSCOPY;  Surgeon: Jeryl Columbia, MD;  Location: [facility];  Service: Endoscopy;  Laterality: N/A;       HPI  from the history and physical done on the day of admission:   The patient is a 81 y.o. year-old female with history of morbid obesity, coronary artery disease, aortic insufficiency, abdominal aortic aneurysm, diabetes mellitus type 2, hypertension, hyperlipidemia, COPD, history of carcinoid syndrome status post resection of carcinoid tumor who presents with nonhealing right lower extremity ulceration with progressive pain and swelling. The patient was last at their baseline health until about a month and a half ago. She states that she injured her right ankle on a dresser drawer about a month and half ago causing a large ulceration deformed. Since that time, she has had  increased redness and swelling and pain. Over the last few days she has had increased pinkness of her leg, bloody purulent discharge from the ulcer, and increased pain. She has been on Bactrim and Augmentin which have not helped. She came to the emergency department tonight because of worsening pain in the area of the ulcer. She denies fevers, chills, nausea, vomiting, diarrhea. She was recently seen in the urgent care and set up for a wound care center appointment which is on Monday 9/19. She states that she has been taking Lasix for her lower extremity swelling which does not work as well as it used to. She denies lightheadedness, dizziness.  In the emergency department, her vital signs were stable, labs notable for normal white blood cell count of 5.6, hemoglobin 10.6 with baseline of around 11-12, sodium 125 which is new, creatinine 1.2 with  baseline of 0.8, mild hyperglycemia. X-ray demonstrated a skin ulcer over the anterior distal leg without acute osseous abnormality or evidence of subcutaneous air. She was given a dose of ceftriaxone, vancomycin, and Percocet and is being admitted for treatment of her ulceration and associated cellulitis.     Hospital Course:     Right lower extremity ulceration, cellulitis. - Seen by vascular surgery and plastics, has finished antibiotic course and currently she has no evidence of cellulitis. She was also seen by vascular surgery and per vascular surgery this is a venous insufficiency ulcer and they requested plastic surgery to be involved, plastic surgery was on board and saw the patient, she was seen by Dr. Milinda Antis, patient was scheduled to undergo plastic surgery procedure on 07/17/2015 however patient and her daughter for some reason became extremely aggressive and abusive towards the plastic surgeon this morning before I saw the patient.   They were accusing that the plastic surgeon has been changed between yesterday and today which is not the case. Plastic surgeon Dr. Marla Roe with regret had to sign off on the case. Patient and family now wants to go home.  Of note I tried to counsel the daughter and patient at length today, they refused help by interpreter, they speak [place] and can speak broken [place] but for some reason they remain very aggressive and hostile. Per nursing staff they have been like that throughout the hospital stay and were very hostile to the plastic surgeon earlier this morning. They are admit not to undergo any surgery and be discharged. They will be discharged with follow-up with their primary care physician at [facility] in [place]. Daughter does not recall his name.   Hyponatremia - Most likely related to diuresis and volume depletion, resolved with gentle hydration.  TSH and cortisol level within normal limits   Mildly elevated creatinine - Due to  dehydration, Resolved, resumed ARB along with home dose Lasix. At [facility] to repeat BMP in 5-7 days.   Aortic valve regurgitation/aortic insufficiency,  - moderate regurgitation demonstrated on echocardiogram from 2013, EF 55-60% with moderate LVH, no acute issues appears compensated.   Chronic diastolic heart failure with EF 55 - 60% on last echogram - Currently appears to be euvolemic, -resumed on Lasix and indapamide .   CAD, - patient is chest pain free, she is on combination of ASA, high dose statin, CCB. Not on BB currently follow with PCP and primary cardiologist postdischarge.   HTN/HLD, - In control no change to home medications    COPD,  - stable, no active wheezing, no shortness of breath or oxygen requirement. Continue home regimen  as before unchanged.    Diabetes mellitus type 2 - CBG stable here, resume home regimen upon discharge and follow with PCP.    Discharge Condition: Fair  Follow UP  Follow-up Information    Follow up with Prim.MD at [facility] In 2 days.       Consults obtained - Vas.Surg, plastics  Diet and Activity recommendation: See Discharge Instructions below  Discharge Instructions           Discharge Instructions    Discharge instructions    Complete by:  As directed   Follow with Primary MD in 7 days , keep your R.Leg clean and dry at all times  Get CBC, CMP, 2 view Chest X ray checked  by Primary MD next visit.    Activity: As tolerated with Full fall precautions use walker/cane & assistance as needed   Disposition Home     Diet: Heart Healthy Low Carb.  For Heart failure patients - Check your Weight same time everyday, if you gain over 2 pounds, or you develop in leg swelling, experience more shortness of breath or chest pain, call your Primary MD immediately. Follow Cardiac Low Salt Diet and 1.5 lit/day fluid restriction.   On your next visit with your primary care physician please Get Medicines reviewed  and adjusted.   Please request your Prim.MD to go over all Hospital Tests and Procedure/Radiological results at the follow up, please get all Hospital records sent to your Prim MD by signing hospital release before you go home.   If you experience worsening of your admission symptoms, develop shortness of breath, life threatening emergency, suicidal or homicidal thoughts you must seek medical attention immediately by calling 911 or calling your MD immediately  if symptoms less severe.  You Must read complete instructions/literature along with all the possible adverse reactions/side effects for all the Medicines you take and that have been prescribed to you. Take any new Medicines after you have completely understood and accpet all the possible adverse reactions/side effects.   Do not drive, operating heavy machinery, perform activities at heights, swimming or participation in water activities or provide baby sitting services if your were admitted for syncope or siezures until you have seen by Primary MD or a Neurologist and advised to do so again.  Do not drive when taking Pain medications.    Do not take more than prescribed Pain, Sleep and Anxiety Medications  Special Instructions: If you have smoked or chewed Tobacco  in the last 2 yrs please stop smoking, stop any regular Alcohol  and or any Recreational drug use.  Wear Seat belts while driving.   Please note  You were cared for by a hospitalist during your hospital stay. If you have any questions about your discharge medications or the care you received while you were in the hospital after you are discharged, you can call the unit and asked to speak with the hospitalist on call if the hospitalist that took care of you is not available. Once you are discharged, your primary care physician will handle any further medical issues. Please note that NO REFILLS for any discharge medications will be authorized once you are discharged, as it is  imperative that you return to your primary care physician (or establish a relationship with a primary care physician if you do not have one) for your aftercare needs so that they can reassess your need for medications and monitor your lab values.     Increase activity slowly  Complete by:  As directed              Discharge Medications       Medication List    STOP taking these medications        amoxicillin-clavulanate 875-125 MG per tablet  Commonly known as:  AUGMENTIN     sulfamethoxazole-trimethoprim 800-160 MG per tablet  Commonly known as:  BACTRIM DS,SEPTRA DS      TAKE these medications        ACCU-CHEK AVIVA PLUS test strip  Generic drug:  glucose blood     ACCU-CHEK SOFTCLIX LANCETS lancets     acetaminophen 500 MG tablet  Commonly known as:  TYLENOL  Take 1,000 mg by mouth 2 (two) times daily as needed (pain).     acetaminophen-codeine 300-60 MG per tablet  Commonly known as:  TYLENOL #4  Take 1 tablet by mouth every 8 (eight) hours as needed for moderate pain.     aspirin EC 81 MG tablet  Take 81 mg by mouth every evening. 7 pm     atorvastatin 10 MG tablet  Commonly known as:  LIPITOR  Take 10 mg by mouth at bedtime.     BENICAR 40 MG tablet  Generic drug:  olmesartan  TAKE 1 TABLET (40 MG TOTAL) BY MOUTH 2 (TWO) TIMES DAILY.     diclofenac sodium 1 % Gel  Commonly known as:  VOLTAREN  APPLY 2 GRAMS TOPICALLY 4 (FOUR) TIMES DAILY.     Dressing Sponges 4"X4" Pads  Provide 1 month supply for daily dressing change for the following dressing instruction - supply all the materials needed  Collagenase (Santyl) ointment in a 1/8 inch layer, top with single saline moistened gauze 4x4 (opened). Cover with dry 4x4s and secure with tape     fluticasone 220 MCG/ACT inhaler  Commonly known as:  FLOVENT HFA  Inhale 1 puff into the lungs 2 (two) times daily.     furosemide 20 MG tablet  Commonly known as:  LASIX  Take 20 mg by mouth daily as needed for  fluid or edema.     glimepiride 1 MG tablet  Commonly known as:  AMARYL  Take 1 mg by mouth daily.     indapamide 2.5 MG tablet  Commonly known as:  LOZOL  TAKE 1 TABLET (2.5 MG TOTAL) BY MOUTH EVERY MORNING.     INTEGRA PLUS Caps  TAKE ONE CAPSULE BY MOUTH EVERY DAY *NOT COVERED*     montelukast 10 MG tablet  Commonly known as:  SINGULAIR  Take 10 mg by mouth at bedtime.     multivitamin with minerals Tabs tablet  Take 1 tablet by mouth daily.     NIFEdipine 60 MG 24 hr tablet  Commonly known as:  PROCARDIA XL/ADALAT-CC  Take 1 tablet (60 mg total) by mouth daily.     NITROSTAT 0.4 MG SL tablet  Generic drug:  nitroGLYCERIN  PLACE 1 TABLET UNDER THE TONGUE EVERY 5 MINUTES AS NEEDED FOR CHEST PAIN     omeprazole 20 MG capsule  Commonly known as:  PRILOSEC  Take 20 mg by mouth daily before breakfast.     pioglitazone 15 MG tablet  Commonly known as:  ACTOS  Take 15 mg by mouth daily.     potassium chloride 10 MEQ tablet  Commonly known as:  K-DUR,KLOR-CON  Take 20 mEq by mouth daily.     PROAIR HFA 108 (90 BASE) MCG/ACT inhaler  Generic drug:  albuterol  Inhale 2 puffs into the lungs every 6 (six) hours as needed for wheezing or shortness of breath.     traMADol 50 MG tablet  Commonly known as:  ULTRAM  Take 1 tablet (50 mg total) by mouth 3 (three) times daily as needed.        Major procedures and Radiology Reports - PLEASE review detailed and final reports for all details, in brief -       Dg Tibia/fibula Right  07/12/2015   CLINICAL DATA:  81 year old female with open wound and the anterior aspect of the right tibia fibula.  EXAM: RIGHT TIBIA AND FIBULA - 2 VIEW  COMPARISON:  Radiograph dated 09/26/2009  FINDINGS: There is no acute fracture or dislocation. The bones are osteopenic. There is a focal defect in the anterior skin in the distal aspect of the right leg corresponding to the known ulcer. No radiopaque foreign object identified.  IMPRESSION: Skin  ulcer over the anterior distal leg.  No acute osseous abnormality.   Electronically Signed   By: Anner Crete M.D.   On: 07/12/2015 22:21    Micro Results      No results found for this or any previous visit (from the past 240 hour(s)).     Today   Subjective    [name] today has no headache,no chest abdominal pain,no new weakness tingling or numbness, feels much better wants to go home today.     Objective   Blood pressure 154/53, pulse 65, temperature 97.7 F (36.5 C), temperature source Oral, resp. rate 16, weight 80.12 kg (176 lb 10.1 oz), SpO2 96 %.   Intake/Output Summary (Last 24 hours) at 07/17/15 1315 Last data filed at 07/17/15 0926  Gross per 24 hour  Intake    720 ml  Output    727 ml  Net     -7 ml    Exam Awake Alert, Oriented x 3, No new F.N deficits, Normal affect [place].AT,PERRAL Supple Neck,No JVD, No cervical lymphadenopathy appriciated.  Symmetrical Chest wall movement, Good air movement bilaterally, CTAB RRR,No Gallops,Rubs or new Murmurs, No Parasternal Heave +ve B.Sounds, Abd Soft, Non tender, No organomegaly appriciated, No rebound -guarding or rigidity. No Cyanosis, Clubbing or edema, No new Rash or bruise, R Leg ulcer under bandage, no signs of cellulits   Data Review   CBC w Diff:  Lab Results  Component Value Date   WBC 7.6 07/16/2015   WBC 7.3 08/05/2014   HGB 12.4 07/16/2015   HGB 12.3 08/05/2014   HCT 38.5 07/16/2015   HCT 38.5 08/05/2014   PLT 301 07/16/2015   PLT 246 08/05/2014   LYMPHOPCT 19 07/16/2015   LYMPHOPCT 19.9 08/05/2014   MONOPCT 5 07/16/2015   MONOPCT 6.7 08/05/2014   EOSPCT 1 07/16/2015   EOSPCT 2.1 08/05/2014   BASOPCT 0 07/16/2015   BASOPCT 0.3 08/05/2014    CMP:  Lab Results  Component Value Date   NA 136 07/16/2015   NA 141 08/05/2014   NA 144 12/02/2011   K 3.8 07/16/2015   K 4.2 08/05/2014   K 3.7 12/02/2011   CL 97* 07/16/2015   CL 104 07/07/2012   CL 100 12/02/2011   CO2 31  07/16/2015   CO2 29 08/05/2014   CO2 30 12/02/2011   BUN 20 07/16/2015   BUN 23.1 08/05/2014   BUN 20 12/02/2011   CREATININE 0.76 07/16/2015   CREATININE 0.8 08/05/2014   CREATININE 0.60 05/29/2014   PROT 6.6 07/12/2015  PROT 7.1 08/05/2014   PROT 7.6 12/02/2011   ALBUMIN 3.5 07/12/2015   ALBUMIN 3.5 08/05/2014   BILITOT 0.5 07/12/2015   BILITOT 0.35 08/05/2014   BILITOT 0.80 12/02/2011   ALKPHOS 79 07/12/2015   ALKPHOS 96 08/05/2014   ALKPHOS 73 12/02/2011   AST 30 07/12/2015   AST 21 08/05/2014   AST 30 12/02/2011   ALT 18 07/12/2015   ALT 16 08/05/2014   ALT 22 12/02/2011  .   Total Time in preparing paper work, data evaluation and todays exam - 35 minutes  Thurnell Lose M.D on 07/17/2015 at 1:15 PM  [facility]   Office  [phone]

## 2015-07-17 NOTE — Progress Notes (Signed)
Entered pt's room for am rounds. Pt's daughter told nurse that she didn't want for her mother to have surgery because the doctor was not nice and because the pt said the doctor performing the surgery was different from the doctor the day before. Sanders,Jasmine A 07/17/2015

## 2015-07-17 NOTE — Progress Notes (Signed)
The patient's daughter was called last night to share information about her mother and plan for surgery.  It seemed there may be some language challenges as the daughter would repeat what I said with some correctness and some incorrectness.  She yelled throughout the conversation to the point of being abusive.   The patient agreed to surgery when I expressed the reasons and indications.  You seemed to understand.  This morning, I went to the room to discuss the surgery with the daughter and patient.  The phone was brought in with the translator on the line.  The patient and daughter refused to allow use of the translator.  I expressed the need, the indication, our desire to help.  They still refused.  I expressed that if the translator was forbidden I would not be able to operate and would not accept the patient in my clinic.  The translator relayed the information and they expressed understanding and told me to leave.  Based on this, I will not be returning.

## 2015-07-17 NOTE — Discharge Instructions (Signed)
Follow with Primary MD in 7 days , keep your R.Leg clean and dry at all times  Get CBC, CMP, 2 view Chest X ray checked  by Primary MD next visit.    Activity: As tolerated with Full fall precautions use walker/cane & assistance as needed   Disposition Home     Diet: Heart Healthy Low Carb.  For Heart failure patients - Check your Weight same time everyday, if you gain over 2 pounds, or you develop in leg swelling, experience more shortness of breath or chest pain, call your Primary MD immediately. Follow Cardiac Low Salt Diet and 1.5 lit/day fluid restriction.   On your next visit with your primary care physician please Get Medicines reviewed and adjusted.   Please request your Prim.MD to go over all Hospital Tests and Procedure/Radiological results at the follow up, please get all Hospital records sent to your Prim MD by signing hospital release before you go home.   If you experience worsening of your admission symptoms, develop shortness of breath, life threatening emergency, suicidal or homicidal thoughts you must seek medical attention immediately by calling 911 or calling your MD immediately  if symptoms less severe.  You Must read complete instructions/literature along with all the possible adverse reactions/side effects for all the Medicines you take and that have been prescribed to you. Take any new Medicines after you have completely understood and accpet all the possible adverse reactions/side effects.   Do not drive, operating heavy machinery, perform activities at heights, swimming or participation in water activities or provide baby sitting services if your were admitted for syncope or siezures until you have seen by Primary MD or a Neurologist and advised to do so again.  Do not drive when taking Pain medications.    Do not take more than prescribed Pain, Sleep and Anxiety Medications  Special Instructions: If you have smoked or chewed Tobacco  in the last 2 yrs  please stop smoking, stop any regular Alcohol  and or any Recreational drug use.  Wear Seat belts while driving.   Please note  You were cared for by a hospitalist during your hospital stay. If you have any questions about your discharge medications or the care you received while you were in the hospital after you are discharged, you can call the unit and asked to speak with the hospitalist on call if the hospitalist that took care of you is not available. Once you are discharged, your primary care physician will handle any further medical issues. Please note that NO REFILLS for any discharge medications will be authorized once you are discharged, as it is imperative that you return to your primary care physician (or establish a relationship with a primary care physician if you do not have one) for your aftercare needs so that they can reassess your need for medications and monitor your lab values.

## 2015-07-17 NOTE — Progress Notes (Signed)
OR called verifying time to pick up patient. Patient needed to sign consent, have surgical PCR completed and CHG bath. Patient's daughter walks on unit and this RN follows her to room to get consent signed and inform about surgery time. Daughter says " I don't talk to you, you no Doctor". I informed the daughter and patient about what's required from them and they request that I leave the room and don't come back. They only want to speak to the Doctor. OR has been notified. MD office called, waiting on call back. RN will continue to monitor patient.   Ermalinda Memos, RN

## 2015-07-17 NOTE — Care Management Note (Signed)
Case Management Note  Patient Details  Name: [name] MRN: [id number] Date of Birth: Nov 26, 1931  Subjective/Objective:     CM following for progression and d/c planning.               Action/Plan: 07/17/2015 Pt for d/c to home, daughter selected [facility] for [place] [facility] services and [facility] notified.   Expected Discharge Date:      07/17/2015            Expected Discharge Plan:  [place]  In-House Referral:  NA  Discharge planning Services  CM Consult  Post Acute Care Choice:   Choice offered to:  Adult Children  DME Arranged:    DME Agency:     HH Arranged:  RN, PT, Nurse's Aide [place] Agency:  [place]  Status of Service:  Completed, signed off  Medicare Important Message Given:  Yes-second notification given Date Medicare IM Given:    Medicare IM give by:    Date Additional Medicare IM Given:    Additional Medicare Important Message give by:     If discussed at [place] of Stay Meetings, dates discussed:    Additional Comments:  Adron Bene, RN 07/17/2015, 12:40 PM

## 2015-07-19 ENCOUNTER — Other Ambulatory Visit: Payer: Self-pay | Admitting: Physical Medicine & Rehabilitation

## 2015-07-23 DIAGNOSIS — L97919 Non-pressure chronic ulcer of unspecified part of right lower leg with unspecified severity: Secondary | ICD-10-CM

## 2015-07-23 DIAGNOSIS — I83229 Varicose veins of left lower extremity with both ulcer of unspecified site and inflammation: Secondary | ICD-10-CM

## 2015-07-23 DIAGNOSIS — I890 Lymphedema, not elsewhere classified: Secondary | ICD-10-CM | POA: Insufficient documentation

## 2015-07-23 DIAGNOSIS — L97929 Non-pressure chronic ulcer of unspecified part of left lower leg with unspecified severity: Secondary | ICD-10-CM

## 2015-07-23 DIAGNOSIS — I83219 Varicose veins of right lower extremity with both ulcer of unspecified site and inflammation: Secondary | ICD-10-CM | POA: Insufficient documentation

## 2015-07-23 DIAGNOSIS — I872 Venous insufficiency (chronic) (peripheral): Secondary | ICD-10-CM | POA: Insufficient documentation

## 2015-08-02 ENCOUNTER — Other Ambulatory Visit: Payer: Self-pay | Admitting: Cardiovascular Disease

## 2015-08-04 ENCOUNTER — Telehealth: Payer: Self-pay | Admitting: Internal Medicine

## 2015-08-04 NOTE — Telephone Encounter (Signed)
pt called to cx appt...did not want to r/s at this time...Marland Kitchenok and aware

## 2015-08-06 ENCOUNTER — Other Ambulatory Visit: Payer: Medicare Other

## 2015-08-11 ENCOUNTER — Other Ambulatory Visit: Payer: Medicare Other

## 2015-08-11 ENCOUNTER — Ambulatory Visit (HOSPITAL_COMMUNITY): Payer: Medicare Other

## 2015-08-13 ENCOUNTER — Ambulatory Visit: Payer: Medicare Other | Admitting: Internal Medicine

## 2015-08-19 ENCOUNTER — Other Ambulatory Visit: Payer: Self-pay | Admitting: Cardiovascular Disease

## 2015-08-25 ENCOUNTER — Encounter: Payer: Self-pay | Admitting: *Deleted

## 2015-08-26 DIAGNOSIS — L97909 Non-pressure chronic ulcer of unspecified part of unspecified lower leg with unspecified severity: Secondary | ICD-10-CM

## 2015-08-26 DIAGNOSIS — I83009 Varicose veins of unspecified lower extremity with ulcer of unspecified site: Secondary | ICD-10-CM | POA: Insufficient documentation

## 2015-08-26 DIAGNOSIS — S81809A Unspecified open wound, unspecified lower leg, initial encounter: Secondary | ICD-10-CM | POA: Insufficient documentation

## 2015-08-28 NOTE — Progress Notes (Signed)
Patient ID: [name], female   DOB: [birth year], 81 y.o.   MRN: [id number] [name] is seen today in followup for hypertension ascending thoracic aneurysm dyspnea with metastatic carcinoid to her lungs hypertension and hypercholesterolemia. I reviewed a letter from Dr. Arlyce Dice indicating that her ascending aorta had reached 5 cm. She was referred to [facility] for further evaluation. She does not strike me as an ideal operative candidate. Her hypertension has been under good control. She has had her norvas stopped and Procardia increased to 60 mg which I think is fine. She has significant lower extremity edema from varicose veins and this is harder to treat while on this particular oral hypoglycemic. Otherwise she's not had any significant chest pain. She has mild chronic edema she has mild chronic exertional dyspnea. I believe she sees Dr. Maryellen Pile for her carcinoid CT done in June showed throracic aneurysm of 5.7 cm Seen by Dr Roxan Hockey and not thought to be an operative candidate  Study Conclusions  - Left ventricle: The cavity size was normal. Wall thickness was increased in a pattern of moderate LVH. Systolic function was normal. The estimated ejection fraction was in the range of 55% to 60%. Wall motion was normal; there were no regional wall motion abnormalities. - Aortic valve: Sclerosis without stenosis. Moderate regurgitation. - Mitral valve: Calcified annulus. Mild regurgitation. - Right ventricle: The cavity size was normal. Systolic function was normal. - Right atrium: The atrium was mildly dilated. - Pulmonary arteries: PA peak pressure: 53m Hg (S).  Noncontrast CT 5/13 With fairly stable nodules   04/08/15  CTA indicated stable  5.3 cm ascending thoracic aneurysm  REviewed CT and this is tangential measurement closer to 4.7 cm  Has venous ulcers in RLE with low sodium and indapamide stopped Still on lasix Apparently wound center wants procardia stopped to help with healing   ROS:  Denies fever, malais, weight loss, blurry vision, decreased visual acuity, cough, sputum, SOB, hemoptysis, pleuritic pain, palpitaitons, heartburn, abdominal pain, melena, lower extremity edema, claudication, or rash.  All other systems reviewed and negative  General: Affect appropriate Overweight russian female  HEENT: normal Neck supple with no adenopathy JVP normal no bruits no thyromegaly Lungs clear with no wheezing and good diaphragmatic motion Heart:  S1/S2 1/6 SEM murmur, no rub, gallop or click PMI normal Abdomen: benighn, BS positve, no tenderness, no AAA no bruit.  No HSM or HJR Distal pulses intact with no bruits Plus one  Edema bilaterally and varicosities right leg is wrapped  Neuro non-focal Skin warm and dry No muscular weakness   Current Outpatient Prescriptions  Medication Sig Dispense Refill  . ACCU-CHEK AVIVA PLUS test strip   5  . ACCU-CHEK SOFTCLIX LANCETS lancets   5  . albuterol (PROAIR HFA) 108 (90 BASE) MCG/ACT inhaler Inhale 2 puffs into the lungs every 6 (six) hours as needed for wheezing or shortness of breath.    .Marland Kitchenaspirin EC 81 MG tablet Take 81 mg by mouth every evening. 7 pm    . atorvastatin (LIPITOR) 10 MG tablet Take 10 mg by mouth at bedtime.  12  . BENICAR 40 MG tablet TAKE 1 TABLET (40 MG TOTAL) BY MOUTH 2 (TWO) TIMES DAILY. 60 tablet 1  . diclofenac sodium (VOLTAREN) 1 % GEL Apply 2 g topically 2 (two) times daily as needed (FOR PAIN).    .Marland KitchenFeFum-FePoly-FA-B Cmp-C-Biot (INTEGRA PLUS) CAPS TAKE ONE CAPSULE BY MOUTH EVERY DAY *NOT COVERED* 30 capsule 5  . fluticasone (FLOVENT HFA)  220 MCG/ACT inhaler Inhale 1 puff into the lungs 2 (two) times daily.    . furosemide (LASIX) 20 MG tablet Take 20 mg by mouth daily as needed for fluid or edema.     . Gauze Pads & Dressings (DRESSING SPONGES) 4"X4" PADS Provide 1 month supply for daily dressing change for the following dressing instruction - supply all the materials needed  Collagenase (Santyl)  ointment in a 1/8 inch layer, top with single saline moistened gauze 4x4 (opened). Cover with dry 4x4s and secure with tape 30 each 1  . glimepiride (AMARYL) 1 MG tablet Take 1 mg by mouth daily.     . montelukast (SINGULAIR) 10 MG tablet Take 10 mg by mouth at bedtime.  1  . Multiple Vitamin (MULITIVITAMIN WITH MINERALS) TABS Take 1 tablet by mouth daily.    . nitroGLYCERIN (NITROSTAT) 0.4 MG SL tablet Place 0.4 mg under the tongue every 5 (five) minutes as needed for chest pain (3 DOSES MAX).    Marland Kitchen omeprazole (PRILOSEC) 20 MG capsule Take 20 mg by mouth daily before breakfast.  5  . pioglitazone (ACTOS) 15 MG tablet Take 15 mg by mouth daily.    . potassium chloride (K-DUR,KLOR-CON) 10 MEQ tablet Take 20 mEq by mouth daily.     No current facility-administered medications for this visit.    Allergies  Review of patient's allergies indicates no known allergies.  Electrocardiogram:  07/12/13  SR rate 82 PR 288 LAFB  LVH  12/10/14 no significant change SR rate 69 LAFB PR 318 LVH   Assessment and Plan  HTN: stop adalat at request of wound center add once daily hydralazine Thoracic Aneurysm: CT June 5.3 no change not and operative candidate has been seen by Dr Roxan Hockey DM: Discussed low carb diet.  Target hemoglobin A1c is 6.5 or less.  Continue current medications. Chol: No results found for: [facility] labs with primary Venous Ulcers:  Continue diuretic wound care center have stopped adalat.     F/u with me in a year  Jenkins Rouge

## 2015-08-29 ENCOUNTER — Emergency Department (HOSPITAL_COMMUNITY)
Admission: EM | Admit: 2015-08-29 | Discharge: 2015-08-29 | Disposition: A | Discharge status: Home / Self Care | Payer: Medicare Other | Attending: Physician Assistant | Admitting: Physician Assistant

## 2015-08-29 ENCOUNTER — Encounter: Payer: Self-pay | Admitting: Cardiovascular Disease

## 2015-08-29 ENCOUNTER — Ambulatory Visit (INDEPENDENT_AMBULATORY_CARE_PROVIDER_SITE_OTHER): Payer: Medicare Other | Admitting: Cardiovascular Disease

## 2015-08-29 ENCOUNTER — Encounter (HOSPITAL_COMMUNITY): Payer: Self-pay | Admitting: Emergency Medicine

## 2015-08-29 VITALS — BP 130/66 | HR 63 | Ht 60.0 in | Wt 167.0 lb

## 2015-08-29 DIAGNOSIS — L97919 Non-pressure chronic ulcer of unspecified part of right lower leg with unspecified severity: Secondary | ICD-10-CM | POA: Diagnosis not present

## 2015-08-29 DIAGNOSIS — Z7982 Long term (current) use of aspirin: Secondary | ICD-10-CM | POA: Diagnosis not present

## 2015-08-29 DIAGNOSIS — J449 Chronic obstructive pulmonary disease, unspecified: Secondary | ICD-10-CM | POA: Diagnosis not present

## 2015-08-29 DIAGNOSIS — Z79899 Other long term (current) drug therapy: Secondary | ICD-10-CM | POA: Insufficient documentation

## 2015-08-29 DIAGNOSIS — I2510 Atherosclerotic heart disease of native coronary artery without angina pectoris: Secondary | ICD-10-CM | POA: Diagnosis not present

## 2015-08-29 DIAGNOSIS — I10 Essential (primary) hypertension: Secondary | ICD-10-CM | POA: Diagnosis not present

## 2015-08-29 DIAGNOSIS — Z859 Personal history of malignant neoplasm, unspecified: Secondary | ICD-10-CM | POA: Insufficient documentation

## 2015-08-29 DIAGNOSIS — E119 Type 2 diabetes mellitus without complications: Secondary | ICD-10-CM | POA: Insufficient documentation

## 2015-08-29 DIAGNOSIS — L089 Local infection of the skin and subcutaneous tissue, unspecified: Secondary | ICD-10-CM | POA: Diagnosis present

## 2015-08-29 DIAGNOSIS — E785 Hyperlipidemia, unspecified: Secondary | ICD-10-CM | POA: Insufficient documentation

## 2015-08-29 DIAGNOSIS — K219 Gastro-esophageal reflux disease without esophagitis: Secondary | ICD-10-CM | POA: Insufficient documentation

## 2015-08-29 LAB — CBC WITH DIFFERENTIAL/PLATELET
Basophils Absolute: 0 10*3/uL (ref 0.0–0.1)
Basophils Relative: 0 %
EOS ABS: 0.2 10*3/uL (ref 0.0–0.7)
Eosinophils Relative: 2 %
HEMATOCRIT: 34.8 % — AB (ref 36.0–46.0)
HEMOGLOBIN: 11.2 g/dL — AB (ref 12.0–15.0)
Lymphocytes Relative: 17 %
Lymphs Abs: 1.3 10*3/uL (ref 0.7–4.0)
MCH: 29.6 pg (ref 26.0–34.0)
MCHC: 32.2 g/dL (ref 30.0–36.0)
MCV: 91.8 fL (ref 78.0–100.0)
Monocytes Absolute: 0.7 10*3/uL (ref 0.1–1.0)
Monocytes Relative: 9 %
NEUTROS ABS: 5.5 10*3/uL (ref 1.7–7.7)
NEUTROS PCT: 72 %
Platelets: 383 10*3/uL (ref 150–400)
RBC: 3.79 MIL/uL — AB (ref 3.87–5.11)
RDW: 14.6 % (ref 11.5–15.5)
WBC: 7.7 10*3/uL (ref 4.0–10.5)

## 2015-08-29 LAB — COMPREHENSIVE METABOLIC PANEL
ALBUMIN: 3.3 g/dL — AB (ref 3.5–5.0)
ALK PHOS: 75 U/L (ref 38–126)
ALT: 19 U/L (ref 14–54)
AST: 21 U/L (ref 15–41)
Anion gap: 13 (ref 5–15)
BILIRUBIN TOTAL: 0.3 mg/dL (ref 0.3–1.2)
BUN: 43 mg/dL — AB (ref 6–20)
CALCIUM: 9.4 mg/dL (ref 8.9–10.3)
CO2: 27 mmol/L (ref 22–32)
CREATININE: 0.79 mg/dL (ref 0.44–1.00)
Chloride: 95 mmol/L — ABNORMAL LOW (ref 101–111)
GFR calc Af Amer: >60 mL/min (ref >60)
GFR calc non Af Amer: >60 mL/min (ref >60)
GLUCOSE: 154 mg/dL — AB (ref 65–99)
Potassium: 4.1 mmol/L (ref 3.5–5.1)
SODIUM: 135 mmol/L (ref 135–145)
Total Protein: 6.8 g/dL (ref 6.5–8.1)

## 2015-08-29 MED ORDER — FUROSEMIDE 20 MG PO TABS: 20.0000 mg | ORAL_TABLET | Freq: Every day | ORAL | Status: DC | PRN

## 2015-08-29 MED ORDER — HYDRALAZINE HCL 25 MG PO TABS: 25.0000 mg | ORAL_TABLET | Freq: Every day | ORAL | Status: DC

## 2015-08-29 NOTE — ED Notes (Signed)
Pt refuses to let this RN unwrap bandage from pt right lower extremity wound. Unable to assess at this time.

## 2015-08-29 NOTE — ED Notes (Signed)
Doctor and nurse together spoke with patient and daughter at bedside regarding plan of care. Doctor explained numerous times the way to assess the leg is to remove the bandage patient and daughter refused and refused to use the language line. Patient and daughter verbalized will follow up with doctor and has an appointment on Tuesday and will come back to the ED with any new or worsening symptoms.

## 2015-08-29 NOTE — Patient Instructions (Signed)
Medication Instructions:  STOP NIFEDIPINE START  HYDRALAZINE  25 MG  EVERY DAY  Labwork: NONE  Testing/Procedures: NONE  Follow-Up: Your physician wants you to follow-up in: [place] will receive a reminder letter in the mail two months in advance. If you don't receive a letter, please call our office to schedule the follow-up appointment.  Any Other Special Instructions Will Be Listed Below (If Applicable).     If you need a refill on your cardiac medications before your next appointment, please call your pharmacy.

## 2015-08-29 NOTE — ED Notes (Signed)
Pt here with family c/o possible infection to wound on right leg; per family seen at PCP yesterday and told that there was no infection

## 2015-08-29 NOTE — ED Provider Notes (Signed)
CSN: [id number]     Arrival date & time 08/29/15  1528 History   First MD Initiated Contact with Patient 08/29/15 1729     Chief Complaint  Patient presents with  . Wound Check     (Consider location/radiation/quality/duration/timing/severity/associated sxs/prior Treatment) HPI   Patient is an 81 year old primarily [place] speaking female presenting today because she's having trouble sleeping. Patient here with daughter. They're both very aggressive and hostile on arrival. She had recent long hospitalization for cellulitis versus chronic wound on her right lower extremity. Patient was seen by the wound physician yesterday and had redressed. Patient and daughter refusing to let me look at the wound here. Patient's daughter saying she wants "blood work for infection". She will not elaborate. She says "you need to look in chart".  Patient has been eating and drinking normally. She denies fevers. She states that she still has pain in her right leg despite the use of tramadol. And daughter says that patient's been having trouble sleeping.  Past Medical History  Diagnosis Date  . Diabetes mellitus   . Hypertension   . Coronary artery disease   . Aortic insufficiency   . Hyperlipidemia   . Aortic aneurysm ([place])   . Hypercholesterolemia   . COPD (chronic obstructive pulmonary disease) ([place])   . Bronchitis   . Edema   . GERD (gastroesophageal reflux disease)   . Aortic valve disease     stable  . Pain in joint, forearm   . Pain in joint, hand   . Pain in joint, lower leg   . Difficulty in walking(719.7)   . Disturbance of skin sensation   . Cancer [facility])    Past Surgical History  Procedure Laterality Date  . Pars plana vitrectomy      right eye  . Posterior capsulectomy      right eye  . Pars plana vitrectomy w/ endophotocoagulation      right eye  . Fiberoptic bronch with endobronchial u/s  07/02/2010  . Video bronchoscopy  12/09/2009  . Endobronchial excision of right upper lobe  tumor with laser bronchi  10/06/2009  . Bronch with endobronchial biopies  09/15/2009  . Colonoscopy N/A 09/13/2013    Procedure: COLONOSCOPY;  Surgeon: Jeryl Columbia, MD;  Location: [facility];  Service: Endoscopy;  Laterality: N/A;   Family History  Problem Relation Age of Onset  . Hypertension Mother   . Hypertension Father    Social History  Substance Use Topics  . Smoking status: Never Smoker   . Smokeless tobacco: Never Used  . Alcohol Use: No   OB History    Gravida Para Term Preterm AB TAB SAB Ectopic Multiple Living   '2 1 1  1  1   1     '$ Review of Systems  Unable to perform ROS: Other  Constitutional: Negative for fever.  Cardiovascular: Negative for chest pain.  Gastrointestinal: Negative for vomiting.  Musculoskeletal: Positive for arthralgias.      Allergies  Review of patient's allergies indicates no known allergies.  Home Medications   Prior to Admission medications   Medication Sig Start Date End Date Taking? Authorizing Provider  albuterol (PROAIR HFA) 108 (90 BASE) MCG/ACT inhaler Inhale 2 puffs into the lungs every 6 (six) hours as needed for wheezing or shortness of breath.   Yes Historical Provider, MD  aspirin EC 81 MG tablet Take 81 mg by mouth every evening. 7 pm   Yes Historical Provider, MD  atorvastatin (LIPITOR) 10 MG tablet  Take 10 mg by mouth at bedtime. 06/17/15  Yes Historical Provider, MD  BENICAR 40 MG tablet TAKE 1 TABLET (40 MG TOTAL) BY MOUTH 2 (TWO) TIMES DAILY. 08/19/15  Yes Josue Hector, MD  diclofenac sodium (VOLTAREN) 1 % GEL Apply 2 g topically 2 (two) times daily as needed (FOR PAIN).   Yes Historical Provider, MD  FeFum-FePoly-FA-B Cmp-C-Biot (INTEGRA PLUS) CAPS TAKE ONE CAPSULE BY MOUTH EVERY DAY *NOT COVERED* 05/05/15  Yes Curt Bears, MD  fluticasone (FLOVENT HFA) 220 MCG/ACT inhaler Inhale 1 puff into the lungs 2 (two) times daily.   Yes Historical Provider, MD  furosemide (LASIX) 20 MG tablet Take 1 tablet (20 mg  total) by mouth daily as needed for fluid or edema. 08/29/15  Yes Josue Hector, MD  glimepiride (AMARYL) 1 MG tablet Take 1 mg by mouth daily.    Yes Historical Provider, MD  hydrALAZINE (APRESOLINE) 25 MG tablet Take 1 tablet (25 mg total) by mouth daily. 08/29/15  Yes Josue Hector, MD  montelukast (SINGULAIR) 10 MG tablet Take 10 mg by mouth at bedtime. 06/04/15  Yes Historical Provider, MD  Multiple Vitamin (MULITIVITAMIN WITH MINERALS) TABS Take 1 tablet by mouth daily.   Yes Historical Provider, MD  NIFEdipine (PROCARDIA-XL/ADALAT CC) 60 MG 24 hr tablet Take 60 mg by mouth daily. 08/02/15  Yes Historical Provider, MD  nitroGLYCERIN (NITROSTAT) 0.4 MG SL tablet Place 0.4 mg under the tongue every 5 (five) minutes as needed for chest pain (3 DOSES MAX).   Yes Historical Provider, MD  omeprazole (PRILOSEC) 20 MG capsule Take 20 mg by mouth daily before breakfast. 06/04/15  Yes Historical Provider, MD  pioglitazone (ACTOS) 15 MG tablet Take 15 mg by mouth daily.   Yes Historical Provider, MD  potassium chloride (K-DUR,KLOR-CON) 10 MEQ tablet Take 20 mEq by mouth daily.   Yes Historical Provider, MD  ACCU-CHEK AVIVA PLUS test strip  08/22/14   Historical Provider, MD  ACCU-CHEK SOFTCLIX LANCETS lancets  09/18/14   Historical Provider, MD  Gauze Pads & Dressings (DRESSING SPONGES) 4"X4" PADS Provide 1 month supply for daily dressing change for the following dressing instruction - supply all the materials needed  Collagenase (Santyl) ointment in a 1/8 inch layer, top with single saline moistened gauze 4x4 (opened). Cover with dry 4x4s and secure with tape 07/17/15   Thurnell Lose, MD   BP 115/54 mmHg  Pulse 74  Temp(Src) 97.4 F (36.3 C) (Oral)  Resp 16  SpO2 94% Physical Exam  Constitutional: She appears well-developed and well-nourished.  HENT:  Head: Normocephalic and atraumatic.  Eyes: Conjunctivae are normal. Right eye exhibits no discharge.  Neck: Neck supple.  Cardiovascular:  Normal rate and regular rhythm.   No murmur heard. Pulmonary/Chest: Effort normal and breath sounds normal. She has no wheezes. She has no rales.  Abdominal: Soft. She exhibits no distension. There is no tenderness.  Musculoskeletal: Normal range of motion. She exhibits no edema.  Right leg has dressing on it. Patient refuses to let me look underneath dressing.  Neurological: No cranial nerve deficit.  Skin: Skin is warm and dry. No rash noted. She is not diaphoretic.  Nursing note and vitals reviewed.   ED Course  Procedures (including critical care time) Labs Review Labs Reviewed  CBC WITH DIFFERENTIAL/PLATELET  COMPREHENSIVE METABOLIC PANEL    Imaging Review No results found. I have personally reviewed and evaluated these images and lab results as part of my medical decision-making.   EKG Interpretation  None      MDM   Final diagnoses:  None   he is an 81 year old female with chronic right lower extremity wound. She is presenting today for unclear reasons. Patient's daughter demanding blood work "for infection". I talked to the daughter multiple times. Daughter seems concerned because mom is unable to sleep. She thinks this  represents infection. I told her that we should look at the leg in order to see whether it's infected. But she refuses to let me take down the dressing. She states only that she wants "[facility] a fair amount of time looking at patient's past records. It appears that she's had trouble interacting with medical staff  in the past.  We will do CBC and Chem-7. Patient appears healthy. Patient has no acute acute distress. Her physical exam is normal as far as she will let me look at her. Her vital signs are normal.  Patient's daughter states that her she has a primary care physician that she can follow-up with.  8:17 PM  Blood work is normal. No fever still. Still normal vital signs. Patient patient daughter still refusing to let me look at  leg. One in there with the nurse. She's refusing [place] translation or allowing me to look at leg.  I feel safe with her discharged given she had a wound specialist look at her leg yesterday (toes have good cap refill underneath dressing),. She reports pain for many months and has normal vitals and lab work.   Ki Corbo Julio Alm, MD 08/29/15 2019

## 2015-09-03 ENCOUNTER — Encounter: Payer: Self-pay | Admitting: Cardiology

## 2015-09-05 ENCOUNTER — Emergency Department (HOSPITAL_COMMUNITY)
Admission: EM | Admit: 2015-09-05 | Discharge: 2015-09-08 | Disposition: A | Discharge status: Home / Self Care | Payer: Medicare Other | Attending: Emergency Medicine | Admitting: Emergency Medicine

## 2015-09-05 ENCOUNTER — Encounter (HOSPITAL_COMMUNITY): Payer: Self-pay | Admitting: Family Medicine

## 2015-09-05 DIAGNOSIS — K219 Gastro-esophageal reflux disease without esophagitis: Secondary | ICD-10-CM | POA: Insufficient documentation

## 2015-09-05 DIAGNOSIS — E785 Hyperlipidemia, unspecified: Secondary | ICD-10-CM | POA: Insufficient documentation

## 2015-09-05 DIAGNOSIS — L089 Local infection of the skin and subcutaneous tissue, unspecified: Secondary | ICD-10-CM | POA: Diagnosis present

## 2015-09-05 DIAGNOSIS — L97819 Non-pressure chronic ulcer of other part of right lower leg with unspecified severity: Secondary | ICD-10-CM | POA: Insufficient documentation

## 2015-09-05 DIAGNOSIS — I2510 Atherosclerotic heart disease of native coronary artery without angina pectoris: Secondary | ICD-10-CM | POA: Diagnosis not present

## 2015-09-05 DIAGNOSIS — E119 Type 2 diabetes mellitus without complications: Secondary | ICD-10-CM | POA: Insufficient documentation

## 2015-09-05 DIAGNOSIS — Z859 Personal history of malignant neoplasm, unspecified: Secondary | ICD-10-CM | POA: Diagnosis not present

## 2015-09-05 DIAGNOSIS — E7800 Pure hypercholesterolemia, unspecified: Secondary | ICD-10-CM | POA: Insufficient documentation

## 2015-09-05 DIAGNOSIS — L97919 Non-pressure chronic ulcer of unspecified part of right lower leg with unspecified severity: Secondary | ICD-10-CM

## 2015-09-05 DIAGNOSIS — I10 Essential (primary) hypertension: Secondary | ICD-10-CM | POA: Diagnosis not present

## 2015-09-05 DIAGNOSIS — Z7982 Long term (current) use of aspirin: Secondary | ICD-10-CM | POA: Diagnosis not present

## 2015-09-05 DIAGNOSIS — Z7951 Long term (current) use of inhaled steroids: Secondary | ICD-10-CM | POA: Diagnosis not present

## 2015-09-05 DIAGNOSIS — J449 Chronic obstructive pulmonary disease, unspecified: Secondary | ICD-10-CM | POA: Insufficient documentation

## 2015-09-05 DIAGNOSIS — Z79899 Other long term (current) drug therapy: Secondary | ICD-10-CM | POA: Diagnosis not present

## 2015-09-05 NOTE — ED Notes (Signed)
RN attempted to assess pt however family did not want RN to assess.  "Only doctor, not nurse, not resident" was communicated over and over.

## 2015-09-05 NOTE — ED Provider Notes (Signed)
CSN: [id number]     Arrival date & time 09/05/15  1613 History   First MD Initiated Contact with Patient 09/05/15 1906     Chief Complaint  Patient presents with  . Leg Pain  . Wound Infection     (Consider location/radiation/quality/duration/timing/severity/associated sxs/prior Treatment) Patient is a 81 y.o. female presenting with leg pain.  Leg Pain Associated symptoms: no fever    patient is a chronic wound on her right lower leg. She sees wound care for it. Patient and family members are worried about infection because she has pain at site. She has had somewhat drainage but states this is unchanged. Family is worried that it is wet. No change in the color of her extremity. No fevers. No chills. Last seen at the wound care center 2 days ago.  Past Medical History  Diagnosis Date  . Diabetes mellitus   . Hypertension   . Coronary artery disease   . Aortic insufficiency   . Hyperlipidemia   . Aortic aneurysm ([place])   . Hypercholesterolemia   . COPD (chronic obstructive pulmonary disease) ([place])   . Bronchitis   . Edema   . GERD (gastroesophageal reflux disease)   . Aortic valve disease     stable  . Pain in joint, forearm   . Pain in joint, hand   . Pain in joint, lower leg   . Difficulty in walking(719.7)   . Disturbance of skin sensation   . Cancer [facility])    Past Surgical History  Procedure Laterality Date  . Pars plana vitrectomy      right eye  . Posterior capsulectomy      right eye  . Pars plana vitrectomy w/ endophotocoagulation      right eye  . Fiberoptic bronch with endobronchial u/s  07/02/2010  . Video bronchoscopy  12/09/2009  . 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 Bronch with endobronchial biopies  09/15/2009  . Colonoscopy N/A 09/13/2013    Procedure: COLONOSCOPY;  Surgeon: Jeryl Columbia, MD;  Location: [facility];  Service: Endoscopy;  Laterality: N/A;   Family History  Problem Relation Age of Onset   . Hypertension Mother   . Hypertension Father    Social History  Substance Use Topics  . Smoking status: Never Smoker   . Smokeless tobacco: Never Used  . Alcohol Use: No   OB History    Gravida Para Term Preterm AB TAB SAB Ectopic Multiple Living   '2 1 1  1  1   1     '$ Review of Systems  Constitutional: Negative for fever and chills.  Cardiovascular: Positive for leg swelling.  Musculoskeletal: Negative for gait problem.  Skin: Positive for wound.  Hematological: Does not bruise/bleed easily.      Allergies  Review of patient's allergies indicates no known allergies.  Home Medications   Prior to Admission medications   Medication Sig Start Date End Date Taking? Authorizing Provider  ACCU-CHEK AVIVA PLUS test strip  08/22/14   Historical Provider, MD  ACCU-CHEK SOFTCLIX LANCETS lancets  09/18/14   Historical Provider, MD  albuterol (PROAIR HFA) 108 (90 BASE) MCG/ACT inhaler Inhale 2 puffs into the lungs every 6 (six) hours as needed for wheezing or shortness of breath.    Historical Provider, MD  aspirin EC 81 MG tablet Take 81 mg by mouth every evening. 7 pm    Historical Provider, MD  atorvastatin (LIPITOR) 10 MG tablet Take 10 mg by  mouth at bedtime. 06/17/15   Historical Provider, MD  BENICAR 40 MG tablet TAKE 1 TABLET (40 MG TOTAL) BY MOUTH 2 (TWO) TIMES DAILY. 08/19/15   Josue Hector, MD  diclofenac sodium (VOLTAREN) 1 % GEL Apply 2 g topically 2 (two) times daily as needed (FOR PAIN).    Historical Provider, MD  FeFum-FePoly-FA-B Cmp-C-Biot (INTEGRA PLUS) CAPS TAKE ONE CAPSULE BY MOUTH EVERY DAY *NOT COVERED* 05/05/15   Curt Bears, MD  fluticasone (FLOVENT HFA) 220 MCG/ACT inhaler Inhale 1 puff into the lungs 2 (two) times daily.    Historical Provider, MD  furosemide (LASIX) 20 MG tablet Take 1 tablet (20 mg total) by mouth daily as needed for fluid or edema. 08/29/15   Josue Hector, MD  Gauze Pads & Dressings (DRESSING SPONGES) 4"X4" PADS Provide 1 month  supply for daily dressing change for the following dressing instruction - supply all the materials needed  Collagenase (Santyl) ointment in a 1/8 inch layer, top with single saline moistened gauze 4x4 (opened). Cover with dry 4x4s and secure with tape 07/17/15   Thurnell Lose, MD  glimepiride (AMARYL) 1 MG tablet Take 1 mg by mouth daily.     Historical Provider, MD  hydrALAZINE (APRESOLINE) 25 MG tablet Take 1 tablet (25 mg total) by mouth daily. 08/29/15   Josue Hector, MD  montelukast (SINGULAIR) 10 MG tablet Take 10 mg by mouth at bedtime. 06/04/15   Historical Provider, MD  Multiple Vitamin (MULITIVITAMIN WITH MINERALS) TABS Take 1 tablet by mouth daily.    Historical Provider, MD  NIFEdipine (PROCARDIA-XL/ADALAT CC) 60 MG 24 hr tablet Take 60 mg by mouth daily. 08/02/15   Historical Provider, MD  nitroGLYCERIN (NITROSTAT) 0.4 MG SL tablet Place 0.4 mg under the tongue every 5 (five) minutes as needed for chest pain (3 DOSES MAX).    Historical Provider, MD  omeprazole (PRILOSEC) 20 MG capsule Take 20 mg by mouth daily before breakfast. 06/04/15   Historical Provider, MD  pioglitazone (ACTOS) 15 MG tablet Take 15 mg by mouth daily.    Historical Provider, MD  potassium chloride (K-DUR,KLOR-CON) 10 MEQ tablet Take 20 mEq by mouth daily.    Historical Provider, MD   BP 128/108 mmHg  Pulse 75  Temp(Src) 97.8 F (36.6 C) (Oral)  Resp 18  SpO2 96% Physical Exam  Constitutional: She appears well-developed.  Cardiovascular: Normal rate.   Musculoskeletal:  Approximately 7 cm ulcer to right lateral lower leg. His granulation tissues appears be healing well. No surrounding erythema. Dressing removed.  Skin: Skin is warm.  Psychiatric: She has a normal mood and affect.    ED Course  Procedures (including critical care time) Labs Review Labs Reviewed - No data to display  Imaging Review No results found. I have personally reviewed and evaluated these images and lab results as part of my  medical decision-making.   EKG Interpretation None      MDM   Final diagnoses:  Ulcer of right lower leg, with unspecified severity ([place])    Patient with some difficulty sleeping. Has a wound on the right lower leg appears to be healing well. Family is worried about infection that appears to be healing well. Does not need antibiotics at this time. She has wound care follow-up. Will discharge home.     Davonna Belling, MD 09/05/15 2032

## 2015-09-05 NOTE — Discharge Instructions (Signed)
Continue dressing changes and follow up with wound care as needed.

## 2015-09-05 NOTE — ED Notes (Signed)
Pt here for re check of wound on right leg. sts has been seen here numerous times and not better.

## 2015-09-10 ENCOUNTER — Telehealth: Payer: Self-pay | Admitting: Internal Medicine

## 2015-09-10 NOTE — Telephone Encounter (Signed)
pt called to r/s appt .Marland Kitchen..pt ok adna ware of new d.t

## 2015-09-12 ENCOUNTER — Other Ambulatory Visit: Payer: Self-pay | Admitting: Medical Oncology

## 2015-09-12 DIAGNOSIS — D3A00 Benign carcinoid tumor of unspecified site: Secondary | ICD-10-CM

## 2015-09-15 ENCOUNTER — Telehealth: Payer: Self-pay | Admitting: Internal Medicine

## 2015-09-15 ENCOUNTER — Ambulatory Visit (HOSPITAL_COMMUNITY)
Admission: RE | Admit: 2015-09-15 | Discharge: 2015-09-15 | Disposition: A | Discharge status: Home / Self Care | Payer: Medicare Other | Source: Ambulatory Visit | Attending: Internal Medicine | Admitting: Internal Medicine

## 2015-09-15 ENCOUNTER — Other Ambulatory Visit (HOSPITAL_BASED_OUTPATIENT_CLINIC_OR_DEPARTMENT_OTHER): Payer: Medicare Other

## 2015-09-15 ENCOUNTER — Telehealth: Payer: Self-pay | Admitting: Oncology

## 2015-09-15 ENCOUNTER — Other Ambulatory Visit: Payer: Self-pay | Admitting: *Deleted

## 2015-09-15 DIAGNOSIS — R918 Other nonspecific abnormal finding of lung field: Secondary | ICD-10-CM | POA: Insufficient documentation

## 2015-09-15 DIAGNOSIS — I719 Aortic aneurysm of unspecified site, without rupture: Secondary | ICD-10-CM

## 2015-09-15 DIAGNOSIS — D3A00 Benign carcinoid tumor of unspecified site: Secondary | ICD-10-CM | POA: Diagnosis present

## 2015-09-15 LAB — COMPREHENSIVE METABOLIC PANEL (CC13)
ALT: 19 U/L (ref 0–55)
AST: 22 U/L (ref 5–34)
Albumin: 3.1 g/dL — ABNORMAL LOW (ref 3.5–5.0)
Alkaline Phosphatase: 86 U/L (ref 40–150)
Anion Gap: 8 mEq/L (ref 3–11)
BILIRUBIN TOTAL: 0.35 mg/dL (ref 0.20–1.20)
BUN: 18.9 mg/dL (ref 7.0–26.0)
CHLORIDE: 103 meq/L (ref 98–109)
CO2: 27 meq/L (ref 22–29)
Calcium: 9 mg/dL (ref 8.4–10.4)
Creatinine: 0.7 mg/dL (ref 0.6–1.1)
EGFR: 80 mL/min/{1.73_m2} — AB (ref >90)
GLUCOSE: 164 mg/dL — AB (ref 70–140)
Potassium: 4.9 mEq/L (ref 3.5–5.1)
SODIUM: 138 meq/L (ref 136–145)
TOTAL PROTEIN: 6.6 g/dL (ref 6.4–8.3)

## 2015-09-15 LAB — CBC WITH DIFFERENTIAL/PLATELET
BASO%: 0.4 % (ref 0.0–2.0)
Basophils Absolute: 0 10*3/uL (ref 0.0–0.1)
EOS%: 1 % (ref 0.0–7.0)
Eosinophils Absolute: 0.1 10*3/uL (ref 0.0–0.5)
HCT: 36.5 % (ref 34.8–46.6)
HGB: 11.7 g/dL (ref 11.6–15.9)
LYMPH%: 13.6 % — AB (ref 14.0–49.7)
MCH: 29.2 pg (ref 25.1–34.0)
MCHC: 32 g/dL (ref 31.5–36.0)
MCV: 91.3 fL (ref 79.5–101.0)
MONO#: 0.6 10*3/uL (ref 0.1–0.9)
MONO%: 8.2 % (ref 0.0–14.0)
NEUT%: 76.8 % (ref 38.4–76.8)
NEUTROS ABS: 6.1 10*3/uL (ref 1.5–6.5)
Platelets: 304 10*3/uL (ref 145–400)
RBC: 3.99 10*6/uL (ref 3.70–5.45)
RDW: 14.7 % — ABNORMAL HIGH (ref 11.2–14.5)
WBC: 7.9 10*3/uL (ref 3.9–10.3)
lymph#: 1.1 10*3/uL (ref 0.9–3.3)

## 2015-09-15 NOTE — Telephone Encounter (Signed)
Gave and printd new appt...the patient was adament that she wanted appt with only Dr. Tyrell [name] no PA....done

## 2015-09-15 NOTE — Telephone Encounter (Signed)
pt r/s appt to 12/5@ same time-gave pt copy of avs

## 2015-09-16 ENCOUNTER — Other Ambulatory Visit: Payer: Self-pay

## 2015-09-16 DIAGNOSIS — I83813 Varicose veins of bilateral lower extremities with pain: Secondary | ICD-10-CM

## 2015-09-22 ENCOUNTER — Ambulatory Visit: Payer: Medicare Other | Admitting: Oncology

## 2015-09-23 ENCOUNTER — Other Ambulatory Visit: Payer: Self-pay | Admitting: Physical Medicine & Rehabilitation

## 2015-09-26 ENCOUNTER — Ambulatory Visit (HOSPITAL_BASED_OUTPATIENT_CLINIC_OR_DEPARTMENT_OTHER): Payer: Medicare Other | Admitting: Physical Medicine & Rehabilitation

## 2015-09-26 ENCOUNTER — Encounter: Payer: Medicare Other | Attending: Physical Medicine & Rehabilitation

## 2015-09-26 ENCOUNTER — Encounter: Payer: Self-pay | Admitting: Physical Medicine & Rehabilitation

## 2015-09-26 VITALS — BP 192/88 | HR 80

## 2015-09-26 DIAGNOSIS — E785 Hyperlipidemia, unspecified: Secondary | ICD-10-CM | POA: Insufficient documentation

## 2015-09-26 DIAGNOSIS — M1611 Unilateral primary osteoarthritis, right hip: Secondary | ICD-10-CM

## 2015-09-26 DIAGNOSIS — K219 Gastro-esophageal reflux disease without esophagitis: Secondary | ICD-10-CM | POA: Diagnosis not present

## 2015-09-26 DIAGNOSIS — I2510 Atherosclerotic heart disease of native coronary artery without angina pectoris: Secondary | ICD-10-CM | POA: Insufficient documentation

## 2015-09-26 DIAGNOSIS — I351 Nonrheumatic aortic (valve) insufficiency: Secondary | ICD-10-CM | POA: Diagnosis not present

## 2015-09-26 DIAGNOSIS — I10 Essential (primary) hypertension: Secondary | ICD-10-CM | POA: Diagnosis not present

## 2015-09-26 DIAGNOSIS — I719 Aortic aneurysm of unspecified site, without rupture: Secondary | ICD-10-CM | POA: Insufficient documentation

## 2015-09-26 DIAGNOSIS — E119 Type 2 diabetes mellitus without complications: Secondary | ICD-10-CM | POA: Diagnosis not present

## 2015-09-26 DIAGNOSIS — Z859 Personal history of malignant neoplasm, unspecified: Secondary | ICD-10-CM | POA: Insufficient documentation

## 2015-09-26 DIAGNOSIS — J449 Chronic obstructive pulmonary disease, unspecified: Secondary | ICD-10-CM | POA: Diagnosis not present

## 2015-09-26 MED ORDER — TRAMADOL HCL 50 MG PO TABS: 50.0000 mg | ORAL_TABLET | Freq: Four times a day (QID) | ORAL | Status: DC | PRN

## 2015-09-26 MED ORDER — GABAPENTIN 300 MG PO CAPS: 300.0000 mg | ORAL_CAPSULE | Freq: Two times a day (BID) | ORAL | Status: DC

## 2015-09-26 NOTE — Progress Notes (Signed)
Subjective:    Patient ID: [name], female    DOB: 1932-10-07, 81 y.o.   MRN: [id number]  HPI 81 year old female with end-stage osteoarthritis of the right hip with chronic right hip pain. Over the last several months she has had problems with a right anterior leg wound. She has a history of venous disease, she was evaluated in the emergency department and now is going to a wound care center in [place]. She's had some increase in her pain she does get some relief with tramadol 50 g 4 times a day with Tylenol 1 tablet twice a day. In addition to the doctor's that saw her placed her on a higher dose of gabapentin 300 mg twice a day this has helped her sleep. She does not have a history of diabetes.  Pain Inventory Average Pain 8 Pain Right Now 7 My pain is NA  In the last 24 hours, has pain interfered with the following? General activity 1 Relation with others 1 Enjoyment of life 1 What TIME of day is your pain at its worst? morning Sleep (in general) Poor  Pain is worse with: standing Pain improves with: medication and TENS Relief from Meds: NA  Mobility walk with assistance use a walker ability to climb steps?  no do you drive?  no transfers alone  Function Do you have any goals in this area?  no  Neuro/Psych trouble walking  Prior Studies Any changes since last visit?  no  Physicians involved in your care Any changes since last visit?  no   Family History  Problem Relation Age of Onset  . Hypertension Mother   . Hypertension Father    Social History   Social History  . Marital Status: Married    Spouse Name: N/A  . Number of Children: N/A  . Years of Education: N/A   Social History Main Topics  . Smoking status: Never Smoker   . Smokeless tobacco: Never Used  . Alcohol Use: No  . Drug Use: No  . Sexual Activity: Not Currently   Other Topics Concern  . None   Social History Narrative   Past Surgical History  Procedure Laterality Date   . Pars plana vitrectomy      right eye  . Posterior capsulectomy      right eye  . Pars plana vitrectomy w/ endophotocoagulation      right eye  . Fiberoptic bronch with endobronchial u/s  07/02/2010  . Video bronchoscopy  12/09/2009  . 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 Bronch with endobronchial biopies  09/15/2009  . Colonoscopy N/A 09/13/2013    Procedure: COLONOSCOPY;  Surgeon: Jeryl Columbia, MD;  Location: [facility];  Service: Endoscopy;  Laterality: N/A;   Past Medical History  Diagnosis Date  . Diabetes mellitus   . Hypertension   . Coronary artery disease   . Aortic insufficiency   . Hyperlipidemia   . Aortic aneurysm ([place])   . Hypercholesterolemia   . COPD (chronic obstructive pulmonary disease) ([place])   . Bronchitis   . Edema   . GERD (gastroesophageal reflux disease)   . Aortic valve disease     stable  . Pain in joint, forearm   . Pain in joint, hand   . Pain in joint, lower leg   . Difficulty in walking(719.7)   . Disturbance of skin sensation   . Cancer (HCC)    BP 192/88 mmHg  Pulse 80  SpO2 94%  Opioid Risk Score:   Fall Risk Score:  `1  Depression screen PHQ 2/9  Depression screen PHQ 2/9 01/07/2015  Decreased Interest 0  Down, Depressed, Hopeless 0  PHQ - 2 Score 0  Altered sleeping 3  Tired, decreased energy 2  Change in appetite 0  Feeling bad or failure about yourself  3  Trouble concentrating 0  Moving slowly or fidgety/restless 3  Suicidal thoughts 0  PHQ-9 Score 11     Review of Systems  Constitutional: Positive for appetite change.  Respiratory: Positive for cough.   Endocrine:       High Blood Sugar  Musculoskeletal: Positive for gait problem.  All other systems reviewed and are negative.      Objective:   Physical Exam  Constitutional: She appears well-developed and well-nourished.  HENT:  Head: Normocephalic and atraumatic.  Eyes: Conjunctivae and EOM are normal.  Pupils are equal, round, and reactive to light.  Musculoskeletal:       Right hip: She exhibits decreased range of motion and decreased strength.       Left hip: Normal.  Patient with limited range of motion in the right hip with internal and external rotation. Left hip has normal internal and external rotation. Knee without evidence of effusion. She has full extension and flexion to greater than 90  Psychiatric: Her affect is labile.  Nursing note and vitals reviewed.         Assessment & Plan:  1. End-stage osteoarthritis right hip There has been no progressive loss of range of motion or function as a result of this. Left hip is still doing okay. We can continue tramadol 50 kg 4 times a day when necessary. No need for hip injection at the current time  2. Right leg wound being followed by wound care in [place]. She was prescribed some gabapentin by another physician and found this to be helpful at bedtime to help her sleep. Her pain has kept her up at night. We will cont.  this prescription 300 mg twice a day.She may only need this for an additional 1-2 months  Return to clinic 6 months

## 2015-09-26 NOTE — Patient Instructions (Addendum)
Tramadol '50mg'$  4 times a day if needed for pain Gabapentin '300mg'$  twice a day for 1 or 2 months then stop

## 2015-09-26 NOTE — Progress Notes (Signed)
   Subjective:    Patient ID: [name], female    DOB: 28-Jul-1932, 81 y.o.   MRN: [id number]  HPI    Review of Systems     Objective:   Physical Exam        Assessment & Plan:

## 2015-09-29 ENCOUNTER — Telehealth: Payer: Self-pay | Admitting: Cardiovascular Disease

## 2015-09-29 ENCOUNTER — Ambulatory Visit: Payer: Medicare Other | Admitting: Oncology

## 2015-09-29 ENCOUNTER — Telehealth: Payer: Self-pay

## 2015-09-29 MED ORDER — HYDRALAZINE HCL 50 MG PO TABS: 50.0000 mg | ORAL_TABLET | Freq: Three times a day (TID) | ORAL | Status: DC

## 2015-09-29 NOTE — Telephone Encounter (Signed)
New Message    Pt's daughter calling very upset yelling about a nurse not being able to help her and her mother's medication. With her accent it is very difficult to understand her and I can't get any details on what medication the pt is having difficulty with. All I am able to understand is that it is 25 mg and the pt takes it 3x per day. Pt's daughter states that the medication is "no good" and her "bp too high" and "we won't help and she doesn't want her to die". Please call back and advise.

## 2015-09-29 NOTE — Telephone Encounter (Signed)
Patients daughter has been referred to the supervisor as she requested.  Request has been given to April to be followed up by Karna Christmas

## 2015-09-29 NOTE — Telephone Encounter (Signed)
Writer spoke with patient's daughter Rinna who stated her mother's Bp was "very high" despite having increased hydralazine to 25 mg tid as directed if 25 mg q day was not effective.  Rinna confirmed her mother was taking hydralazine 25 mg tid.  Discussed with Dr Johnsie Cancel, order received to increase hydralazine 50 mg tid. Rinna requested prescription be called to [facility] as listed in medical record.  Patient's daughter was able to repeat new medication instructions.  Georgana Curio RN CCM [facility]

## 2015-09-29 NOTE — Telephone Encounter (Signed)
Daughter informed that Dr. Johnsie Cancel increased the hydralazine to 50 mg a three times a day from 25 mg three times a day and it has been sent to her drug store on record.

## 2015-09-29 NOTE — Telephone Encounter (Addendum)
Returned patient call BP the last three days has been 972-820/ systolic with HR in the 60'R Patient with out symptoms Is taking Hydralaizine 25 mg three times a day ordered as once a day on 11/04 Nifedipine dcd on 11/04 request of [place] Continues Lasix 20 mg daily Benicar 40 mg twice a day  Routed to Dr. Edmonia James

## 2015-09-29 NOTE — Telephone Encounter (Signed)
Called daughter Rinna and LVMTCB  Dr. Johnsie Cancel would like the patient to follow up with PCP to address BP problem

## 2015-09-29 NOTE — Telephone Encounter (Signed)
Daughter Terrance Mass [phone]) is upset because she feels Dr. Johnsie Cancel should be handling BP medications.  She wants to talk to the supervisor so they can talk to Dr. Johnsie Cancel to see the patient about her BP.  Offered an appointment for 12/08 at 0900 with Dr. Johnsie Cancel but daughter said that it needs to be done in the afternoon

## 2015-09-29 NOTE — Telephone Encounter (Signed)
New message      I think patient is having trouble with her bp

## 2015-10-01 ENCOUNTER — Ambulatory Visit: Payer: Medicare Other | Admitting: Cardiovascular Disease

## 2015-10-06 ENCOUNTER — Telehealth: Payer: Self-pay | Admitting: Cardiovascular Disease

## 2015-10-06 MED ORDER — AMLODIPINE BESYLATE 10 MG PO TABS: 10.0000 mg | ORAL_TABLET | Freq: Every day | ORAL | Status: DC

## 2015-10-06 NOTE — Telephone Encounter (Signed)
Pt daughter called as pt BP is still high after increased dose of Hydralazine on 12/5 to 50 mg TID.  Spoke with pt she has no c/o at this time of headache, chest pain, sob, or dizziness just increase BP readings.  Pt has checked BP at local pharmacy against her home machine and stated she is getting elevated readings there also.  BP readings over the past two days:  180/77 HR 74  12/11 167/61 HR 74  12/12 176/60  HR 70  12/12  Pt and daughter stated this medication is not working and wants to try something else. Told I would forward to the physican and someone will be back in contact once we have his suggestions/orders. She verbalized understanding no more questions at this time.  Verfied call back numbers:  Home: [phone] Cell: [phone]

## 2015-10-06 NOTE — Telephone Encounter (Signed)
New message     Pt c/o medication issue:  1. Name of Medication: hydralazine 2. How are you currently taking this medication (dosage and times per day)? '50mg'$  tid 3. Are you having a reaction (difficulty breathing--STAT)? no 4. What is your medication issue? I think daughter is saying bp is still high

## 2015-10-06 NOTE — Telephone Encounter (Signed)
Add norvasc 10 mg and f/u PA visit in office this week

## 2015-10-06 NOTE — Telephone Encounter (Signed)
Spoke with daughter she is aware of medication changes. Pt scheduled to see PA on 12/16 2 3pm. Verbalized understanding, no questions at this time.

## 2015-10-06 NOTE — Telephone Encounter (Signed)
Tried to call pt emergency contact, daughter, phone would ring then just go silent without answer.  Tried to call pt home number no answer or voicemail pickup.

## 2015-10-10 ENCOUNTER — Encounter: Payer: Self-pay | Admitting: Physician Assistant

## 2015-10-10 ENCOUNTER — Ambulatory Visit (INDEPENDENT_AMBULATORY_CARE_PROVIDER_SITE_OTHER): Payer: Medicare Other | Admitting: Physician Assistant

## 2015-10-10 ENCOUNTER — Ambulatory Visit: Payer: Medicare Other | Admitting: Cardiology

## 2015-10-10 VITALS — BP 140/60 | HR 83 | Ht 60.0 in | Wt 165.0 lb

## 2015-10-10 DIAGNOSIS — I10 Essential (primary) hypertension: Secondary | ICD-10-CM | POA: Diagnosis not present

## 2015-10-10 DIAGNOSIS — R011 Cardiac murmur, unspecified: Secondary | ICD-10-CM | POA: Diagnosis not present

## 2015-10-10 MED ORDER — HYDRALAZINE HCL 50 MG PO TABS: 50.0000 mg | ORAL_TABLET | Freq: Three times a day (TID) | ORAL | Status: DC

## 2015-10-10 NOTE — Progress Notes (Signed)
Cardiology Office Note   Date:  10/10/2015   ID:  [name], DOB 19-May-1932, MRN [id number]  PCP:  Pcp Not In System  Cardiologist:  Dr. Johnsie Cancel  Chief Complaint  Patient presents with  . Follow-up    seen for Dr. Johnsie Cancel, HTN, CAD, thoracic aneurysm and DM  . Hypertension  . Coronary Artery Disease      History of Present Illness: [name] is a 81 y.o. female who presents for office visit for BP control. 81 year old [place] female with past medical history of hypertension, thoracic aneurysm, CAD, COPD, resection of carcinoid tumor, carcinoid syndrome, HLD, and DM. She has chronically difficult to control hypertension. She has been seen by Dr. Roxan Hockey on 04/08/2015 the degree of aneurysm has not changed since 2010. However given the size, she is at some risk for rupture or dissection and blood pressure control is imperative. She is a poor operative candidate. Patient has called multiple times in the recent month regarding uncontrolled high blood pressure despite adjusting blood pressure medications, her hydralazine was eventually increased to '50mg'$  TID. She presents today accompanied by her daughter and husband. Her daughter drove majority of the conversation, but she was able to communicate as well in broken english. We did obtain an [place] however family did not Animator.   Per daughter since hydralazine increased, the patient's blood pressure has been better. Now mostly in 130-150s. Patient denies any recent chest pain. I asked her about her diagnosis of CAD as I do not see a past cath note or stress test. She does have noticeable coronary calcifications on previous CT of chest. She does have COPD, but does not use home O2. She walked around with a rolling walker. She denies any recent exertional SOB. She has occasional LE edema and takes PRN lasix roughly twice a week. She has a RLE lateral wound which she has been treating. Numerous paperwork brought by  daughter indicate she has venous insufficiency. Otherwise, she denies any dizziness or ever passing out.     Past Medical History  Diagnosis Date  . Diabetes mellitus   . Hypertension   . Coronary artery disease   . Aortic insufficiency   . Hyperlipidemia   . Aortic aneurysm ([place])   . Hypercholesterolemia   . COPD (chronic obstructive pulmonary disease) ([place])   . Bronchitis   . Edema   . GERD (gastroesophageal reflux disease)   . Aortic valve disease     stable  . Pain in joint, forearm   . Pain in joint, hand   . Pain in joint, lower leg   . Difficulty in walking(719.7)   . Disturbance of skin sensation   . Cancer [facility])     Past Surgical History  Procedure Laterality Date  . Pars plana vitrectomy      right eye  . Posterior capsulectomy      right eye  . Pars plana vitrectomy w/ endophotocoagulation      right eye  . Fiberoptic bronch with endobronchial u/s  07/02/2010  . Video bronchoscopy  12/09/2009  . 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 Bronch with endobronchial biopies  09/15/2009  . Colonoscopy N/A 09/13/2013    Procedure: COLONOSCOPY;  Surgeon: Jeryl Columbia, MD;  Location: [facility];  Service: Endoscopy;  Laterality: N/A;     Current Outpatient Prescriptions  Medication Sig Dispense Refill  . ACCU-CHEK AVIVA PLUS test strip   5  .  ACCU-CHEK SOFTCLIX LANCETS lancets   5  . albuterol (PROAIR HFA) 108 (90 BASE) MCG/ACT inhaler Inhale 2 puffs into the lungs every 6 (six) hours as needed for wheezing or shortness of breath.    Marland Kitchen amLODipine (NORVASC) 10 MG tablet Take 1 tablet (10 mg total) by mouth daily. 30 tablet 3  . aspirin EC 81 MG tablet Take 81 mg by mouth every evening. 7 pm    . atorvastatin (LIPITOR) 10 MG tablet Take 10 mg by mouth at bedtime.  12  . BENICAR 40 MG tablet TAKE 1 TABLET (40 MG TOTAL) BY MOUTH 2 (TWO) TIMES DAILY. 60 tablet 1  . collagenase (SANTYL) ointment Apply in nickel thick layer  to wound bed and change daily or as directed.    . diclofenac sodium (VOLTAREN) 1 % GEL APPLY 2 GRAMS TOPICALLY 4 (FOUR) TIMES DAILY. 300 g 1  . FeFum-FePoly-FA-B Cmp-C-Biot (INTEGRA PLUS) CAPS TAKE ONE CAPSULE BY MOUTH EVERY DAY *NOT COVERED* 30 capsule 5  . fluticasone (FLOVENT HFA) 220 MCG/ACT inhaler Inhale 1 puff into the lungs 2 (two) times daily.    . furosemide (LASIX) 20 MG tablet Take 1 tablet (20 mg total) by mouth daily as needed for fluid or edema. 30 tablet 11  . gabapentin (NEURONTIN) 300 MG capsule Take 1 capsule (300 mg total) by mouth 2 (two) times daily. 60 capsule 1  . Gauze Pads & Dressings (DRESSING SPONGES) 4"X4" PADS Provide 1 month supply for daily dressing change for the following dressing instruction - supply all the materials needed  Collagenase (Santyl) ointment in a 1/8 inch layer, top with single saline moistened gauze 4x4 (opened). Cover with dry 4x4s and secure with tape 30 each 1  . glimepiride (AMARYL) 1 MG tablet Take 1 mg by mouth daily.     . hydrALAZINE (APRESOLINE) 50 MG tablet Take 1 tablet (50 mg total) by mouth 3 (three) times daily. Take additional ( 25 mg ) up to three times a day if systolic bp is greater than 160 300 tablet 3  . hydrALAZINE (APRESOLINE) 50 MG tablet Take 1 tablet (50 mg total) by mouth 3 (three) times daily. Take additional ( 25 mg ) if bp is 160 or greater 300 tablet 3  . miconazole (ZEASORB-AF) 2 % powder Apply to affected area one time per day under breast and axillae     . montelukast (SINGULAIR) 10 MG tablet Take 10 mg by mouth at bedtime.  1  . Multiple Vitamin (MULITIVITAMIN WITH MINERALS) TABS Take 1 tablet by mouth daily.    . nitroGLYCERIN (NITROSTAT) 0.4 MG SL tablet Place 0.4 mg under the tongue every 5 (five) minutes as needed for chest pain (3 DOSES MAX).    Marland Kitchen omeprazole (PRILOSEC) 20 MG capsule Take 20 mg by mouth daily before breakfast.  5  . pioglitazone (ACTOS) 15 MG tablet Take 7.5 mg by mouth daily.     . potassium  chloride (K-DUR,KLOR-CON) 10 MEQ tablet Take 20 mEq by mouth daily.    . traMADol (ULTRAM) 50 MG tablet Take 1 tablet (50 mg total) by mouth every 6 (six) hours as needed. 120 tablet 5   No current facility-administered medications for this visit.    Allergies:   Review of patient's allergies indicates no known allergies.    Social History:  The patient  reports that she has never smoked. She has never used smokeless tobacco. She reports that she does not drink alcohol or use illicit drugs.  Family History:  The patient's family history includes Hypertension in her father and mother. There is no history of Heart attack or Stroke.    ROS:  Please see the history of present illness.   Otherwise, review of systems are positive for high blood pressure, but no dizziness, no increasing SOB, no CP.   All other systems are reviewed and negative.    PHYSICAL EXAM: VS:  BP 140/60 mmHg  Pulse 83  Ht 5' (1.524 m)  Wt 165 lb (74.844 kg)  BMI 32.22 kg/m2  SpO2 97% , BMI Body mass index is 32.22 kg/(m^2). GEN: Well nourished, well developed, in no acute distress HEENT: normal Neck: no JVD, carotid bruits, or masses Cardiac: RRR; no murmurs, rubs, or gallops,no edema  Respiratory:  clear to auscultation bilaterally, normal work of breathing GI: soft, nontender, nondistended, + BS MS: no deformity or atrophy Skin: warm and dry, no rash Neuro:  Strength and sensation are intact Psych: euthymic mood, full affect   EKG:  EKG is not ordered today.  Recent Labs: 07/13/2015: B Natriuretic Peptide 257.8*; TSH 1.792 09/15/2015: ALT 19; BUN 18.9; Creatinine 0.7; HGB 11.7; Platelets 304; Potassium 4.9; Sodium 138    Lipid Panel No results found for: CHOL, TRIG, HDL, CHOLHDL, VLDL, LDLCALC, LDLDIRECT    Wt Readings from Last 3 Encounters:  10/10/15 165 lb (74.844 kg)  08/29/15 167 lb (75.751 kg)  07/15/15 176 lb 10.1 oz (80.12 kg)      Other studies Reviewed: Additional studies/ records  that were reviewed today include: last office note, recent phone note, last CT surgery note. Review of the above records demonstrates: labile HTN difficult to control, ascending thoracic aneurysm not good surgical candidate, recent increase in hydralazine dose   ASSESSMENT AND PLAN:  1. Labile Hypertension: BP better after increasing hydralazine to '50mg'$  TID. SBP 140 by nurse, 150 by me. Continue amlodipine and olmesartan. I have instructed her to given additional 1/2 tablet hydralazine ('25mg'$ ) if her SBP is >160. I discussed blood pressure goal with patient, given her age, SBP goal should be less than 150. If possible I would like to her SBP to be closer to 130 as she has dilated ascending aorta and may not be a good surgical candidate  2. H/o moderate AI and loud systolic murmur on exam: she is due to a repeat echo as her last echo was in Jan 2013. She does not have any significant symptom of AI or AS despite loud murmur, will obtain repeat echo prior to her next followup in Feb  3. Thoracic aneurysm, followed by Dr. Roxan Hockey, upcoming followup on 12/27. Per last office note in June 2016, stable size ascending aortic aneurysm since 2010, but she is a poor surgical candidate    4. CAD, based on coronary calcification on CT of chest? I could not find record of previous cath or stress test.   5. COPD: stable 6. HLD: on low dose lipitor 7. DM II: on glimepiride and Actos   Current medicines are reviewed at length with the patient today.  The patient does not have concerns regarding medicines.  The following changes have been made:  Take additional '25mg'$  hydralazine if SBP > 160.   Labs/ tests ordered today include:   Orders Placed This Encounter  Procedures  . Echocardiogram     Disposition:   FU with Dr. Johnsie Cancel in 3 months  Signed, Almyra Deforest PA 10/10/2015 5:12 PM    [place] [address]  [address] [place], [place]  [address] Phone: [phone]; Fax: [phone]

## 2015-10-10 NOTE — Patient Instructions (Signed)
Medication Instructions:  Your physician has recommended you make the following change in your medication:  1. Hydralazine ( 50 mg ) three times a day, if blood pressure goes over 160 you can  take ( 25 mg ) one half tablet up to three times a day.  Can not go over 3 additional half of tablets   Labwork: -None  Testing/Procedures: Your physician has requested that you have an echocardiogram. Echocardiography is a painless test that uses sound waves to create images of your heart. It provides your doctor with information about the size and shape of your heart and how well your heart's chambers and valves are working. This procedure takes approximately one hour. There are no restrictions for this procedure.    Follow-Up: Your physician recommends that you keep your schedule  follow-up appointment with Dr. Johnsie Cancel   Any Other Special Instructions Will Be Listed Below (If Applicable).     If you need a refill on your cardiac medications before your next appointment, please call your pharmacy.

## 2015-10-14 ENCOUNTER — Encounter: Payer: Self-pay | Admitting: Internal Medicine

## 2015-10-14 ENCOUNTER — Ambulatory Visit (HOSPITAL_BASED_OUTPATIENT_CLINIC_OR_DEPARTMENT_OTHER): Payer: Medicare Other | Admitting: Internal Medicine

## 2015-10-14 VITALS — BP 157/67 | HR 102 | Temp 97.9°F | Resp 18 | Ht 60.0 in | Wt 163.9 lb

## 2015-10-14 DIAGNOSIS — D3A00 Benign carcinoid tumor of unspecified site: Secondary | ICD-10-CM | POA: Diagnosis present

## 2015-10-14 DIAGNOSIS — L97911 Non-pressure chronic ulcer of unspecified part of right lower leg limited to breakdown of skin: Secondary | ICD-10-CM | POA: Diagnosis not present

## 2015-10-14 DIAGNOSIS — E1151 Type 2 diabetes mellitus with diabetic peripheral angiopathy without gangrene: Secondary | ICD-10-CM | POA: Diagnosis not present

## 2015-10-14 NOTE — Progress Notes (Signed)
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   PRIOR THERAPY:  1. Status post resection of multiple nodules from the left lung under the care of Dr Arlyce Dice on July 15, 2005. 2. Status post repeat bronchoscopy with endobronchial excision of the right upper lobe tumor and laser bronchoscopy under the care of Dr Arlyce Dice on October 08, 2009.  CURRENT THERAPY: Observation.  INTERVAL HISTORY: [name] 81 y.o. female returns to the clinic today for annual followup visit accompanied by her daughter. The patient is feeling fine today with no specific complaints except for a non-healing ulcer of the right lower extremity after the patient hit the bedside commode. She is currently undergoing treatment under a wound care physician with [facility]. She denied having any significant chest pain, shortness of breath or hemoptysis. The patient denied having any nausea or vomiting. She has no significant weight loss or night sweats. She had repeat chest x-ray and lab work performed recently and she is here for evaluation and discussion of her imaging and lab results.   MEDICAL HISTORY: Past Medical History  Diagnosis Date  . Diabetes mellitus   . Hypertension   . Coronary artery disease   . Aortic insufficiency   . Hyperlipidemia   . Aortic aneurysm ([place])   . Hypercholesterolemia   . COPD (chronic obstructive pulmonary disease) ([place])   . Bronchitis   . Edema   . GERD (gastroesophageal reflux disease)   . Aortic valve disease     stable  . Pain in joint, forearm   . Pain in joint, hand   . Pain in joint, lower leg   . Difficulty in walking(719.7)   . Disturbance of skin sensation   . Cancer ([place])     ALLERGIES:  has No Known Allergies.  MEDICATIONS:  Current Outpatient Prescriptions  Medication Sig Dispense  Refill  . ACCU-CHEK AVIVA PLUS test strip   5  . ACCU-CHEK SOFTCLIX LANCETS lancets   5  . albuterol (PROAIR HFA) 108 (90 BASE) MCG/ACT inhaler Inhale 2 puffs into the lungs every 6 (six) hours as needed for wheezing or shortness of breath.    Marland Kitchen amLODipine (NORVASC) 10 MG tablet Take 1 tablet (10 mg total) by mouth daily. 30 tablet 3  . aspirin EC 81 MG tablet Take 81 mg by mouth every evening. 7 pm    . atorvastatin (LIPITOR) 10 MG tablet Take 10 mg by mouth at bedtime.  12  . BENICAR 40 MG tablet TAKE 1 TABLET (40 MG TOTAL) BY MOUTH 2 (TWO) TIMES DAILY. 60 tablet 1  . collagenase (SANTYL) ointment Apply in nickel thick layer to wound bed and change daily or as directed.    . diclofenac sodium (VOLTAREN) 1 % GEL APPLY 2 GRAMS TOPICALLY 4 (FOUR) TIMES DAILY. 300 g 1  . FeFum-FePoly-FA-B Cmp-C-Biot (INTEGRA PLUS) CAPS TAKE ONE CAPSULE BY MOUTH EVERY DAY *NOT COVERED* 30 capsule 5  . fluticasone (FLOVENT HFA) 220 MCG/ACT inhaler Inhale 1 puff into the lungs 2 (two) times daily.    . furosemide (LASIX) 20 MG tablet Take 1 tablet (20 mg total) by mouth daily as needed for fluid or edema. 30 tablet 11  . gabapentin (NEURONTIN) 300 MG capsule Take 1 capsule (300 mg total) by mouth 2 (two) times daily. 60 capsule 1  .  Gauze Pads & Dressings (DRESSING SPONGES) 4"X4" PADS Provide 1 month supply for daily dressing change for the following dressing instruction - supply all the materials needed  Collagenase (Santyl) ointment in a 1/8 inch layer, top with single saline moistened gauze 4x4 (opened). Cover with dry 4x4s and secure with tape 30 each 1  . Ginkgo Biloba 40 MG TABS Takes occasionally    . glimepiride (AMARYL) 1 MG tablet Take 1 mg by mouth daily.     . hydrALAZINE (APRESOLINE) 50 MG tablet Take 1 tablet (50 mg total) by mouth 3 (three) times daily. Take additional ( 25 mg ) up to three times a day if systolic bp is greater than 160 300 tablet 3  . hydrALAZINE (APRESOLINE) 50 MG tablet Take 1  tablet (50 mg total) by mouth 3 (three) times daily. Take additional ( 25 mg ) if bp is 160 or greater 300 tablet 3  . lidocaine (XYLOCAINE) 2 % jelly Apply topically.    . miconazole (ZEASORB-AF) 2 % powder Apply to affected area one time per day under breast and axillae     . montelukast (SINGULAIR) 10 MG tablet Take 10 mg by mouth at bedtime.  1  . Multiple Vitamin (MULITIVITAMIN WITH MINERALS) TABS Take 1 tablet by mouth daily.    . nitroGLYCERIN (NITROSTAT) 0.4 MG SL tablet Place 0.4 mg under the tongue every 5 (five) minutes as needed for chest pain (3 DOSES MAX).    Marland Kitchen omeprazole (PRILOSEC) 20 MG capsule Take 20 mg by mouth daily before breakfast.  5  . oxyCODONE-acetaminophen (ROXICET) 5-325 MG tablet 1-2 tbs po q 4 hours only if needed for severe pain. Max 6 (six)  tbs 24 hours    . pioglitazone (ACTOS) 15 MG tablet Take 7.5 mg by mouth daily.     . potassium chloride (K-DUR,KLOR-CON) 10 MEQ tablet Take 20 mEq by mouth daily.    Marland Kitchen Spacer/Aero-Holding Chambers (AEROCHAMBER MINI CHAMBER) DEVI use as directed with your inhalers     . traMADol (ULTRAM) 50 MG tablet Take 1 tablet (50 mg total) by mouth every 6 (six) hours as needed. 120 tablet 5  . zinc oxide 20 % ointment Apply topically.     No current facility-administered medications for this visit.    SURGICAL HISTORY:  Past Surgical History  Procedure Laterality Date  . Pars plana vitrectomy      right eye  . Posterior capsulectomy      right eye  . Pars plana vitrectomy w/ endophotocoagulation      right eye  . Fiberoptic bronch with endobronchial u/s  07/02/2010  . Video bronchoscopy  12/09/2009  . 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 Bronch with endobronchial biopies  09/15/2009  . Colonoscopy N/A 09/13/2013    Procedure: COLONOSCOPY;  Surgeon: Jeryl Columbia, MD;  Location: [facility];  Service: Endoscopy;  Laterality: N/A;    REVIEW OF SYSTEMS:  A comprehensive review of  systems was negative except for: Constitutional: positive for fatigue   PHYSICAL EXAMINATION: General appearance: alert, cooperative and no distress Head: Normocephalic, without obvious abnormality, atraumatic Neck: no adenopathy, no JVD, supple, symmetrical, trachea midline and thyroid not enlarged, symmetric, no tenderness/mass/nodules Lymph nodes: Cervical, supraclavicular, and axillary nodes normal. Resp: clear to auscultation bilaterally Cardio: normal apical impulse GI: soft, non-tender; bowel sounds normal; no masses,  no organomegaly Extremities: extremities normal, atraumatic, no cyanosis or edema  ECOG PERFORMANCE STATUS: 1 - Symptomatic but  completely ambulatory  Blood pressure 157/67, pulse 102, temperature 97.9 F (36.6 C), temperature source Oral, resp. rate 18, height 5' (1.524 m), weight 163 lb 14.4 oz (74.345 kg), SpO2 95 %.  LABORATORY DATA: Lab Results  Component Value Date   WBC 7.9 09/15/2015   HGB 11.7 09/15/2015   HCT 36.5 09/15/2015   MCV 91.3 09/15/2015   PLT 304 09/15/2015      Chemistry      Component Value Date/Time   NA 138 09/15/2015 1324   NA 135 08/29/2015 1826   NA 144 12/02/2011 1053   K 4.9 09/15/2015 1324   K 4.1 08/29/2015 1826   K 3.7 12/02/2011 1053   CL 95* 08/29/2015 1826   CL 104 07/07/2012 1435   CL 100 12/02/2011 1053   CO2 27 09/15/2015 1324   CO2 27 08/29/2015 1826   CO2 30 12/02/2011 1053   BUN 18.9 09/15/2015 1324   BUN 43* 08/29/2015 1826   BUN 20 12/02/2011 1053   CREATININE 0.7 09/15/2015 1324   CREATININE 0.79 08/29/2015 1826   CREATININE 0.60 05/29/2014 1543      Component Value Date/Time   CALCIUM 9.0 09/15/2015 1324   CALCIUM 9.4 08/29/2015 1826   CALCIUM 9.1 12/02/2011 1053   ALKPHOS 86 09/15/2015 1324   ALKPHOS 75 08/29/2015 1826   ALKPHOS 73 12/02/2011 1053   AST 22 09/15/2015 1324   AST 21 08/29/2015 1826   AST 30 12/02/2011 1053   ALT 19 09/15/2015 1324   ALT 19 08/29/2015 1826   ALT 22 12/02/2011  1053   BILITOT 0.35 09/15/2015 1324   BILITOT 0.3 08/29/2015 1826   BILITOT 0.80 12/02/2011 1053       RADIOGRAPHIC STUDIES: Ct Chest Wo Contrast  09/15/2015  CLINICAL DATA:  Restaging carcinoid tumor.  Pulmonary nodules. EXAM: CT CHEST WITHOUT CONTRAST TECHNIQUE: Multidetector CT imaging of the chest was performed following the standard protocol without IV contrast. COMPARISON:  CT 04/08/2015, 06/04/2014 FINDINGS: Mediastinum/Nodes: No axillary supraclavicular adenopathy. No mediastinal hilar adenopathy. No pericardial fluid. Stable dilatation ascending aorta to 5.0 cm. Intimal calcification aorta. No pericardial fluid. Esophagus normal. Lungs/Pleura: Bilateral lower lobe nodules again demonstrated. Nodules are stable. For example: 16 mm RIGHT lower lobe nodule in the azygos esophageal recess (image 38, series 5) compares to 17 mm. Nodule in the RIGHT lower lobe measures 7 mm compared to 7 mm (image 32, series 5). Nodule in the LEFT lower lobe measures 5 mm (image 35, series 5 not changed from 5 mm Lesions in the upper lobes are smaller and unchanged. Cluster of nodules along the RIGHT horizontal fissure again noted (image 31, series 5). Upper abdomen: Limited view of the liver, kidneys, pancreas are unremarkable. Normal adrenal glands. Musculoskeletal: Stable degenerative changes of the thoracic spine. IMPRESSION: 1. Stable bilateral pulmonary nodules. 2. No change in ascending aortic aneurysm (5 cm). Electronically Signed   By: Suzy Bouchard M.D.   On: 09/15/2015 15:22   ASSESSMENT AND PLAN: This is a very pleasant 81 years old [place] female with history of carcinoid tumor of the lung status post resection and has been observation since December of 2010 with no significant evidence for disease progression. I discussed the chest x-ray lab result with the patient and her daughter today. I recommended for her to continue on observation with repeat CBC, comprehensive metabolic panel and chest x-ray  in one year. She was advised to call immediately if she has any concerning symptoms in the interval.  For the  right leg ulcer she will continue her current care with the wound center at [place]  The patient voices understanding of current disease status and treatment options and is in agreement with the current care plan.  All questions were answered. The patient knows to call the clinic with any problems, questions or concerns. We can certainly see the patient much sooner if necessary.  Disclaimer: This note was dictated with voice recognition software. Similar sounding words can inadvertently be transcribed and may be missed upon review.

## 2015-10-15 ENCOUNTER — Telehealth: Payer: Self-pay | Admitting: Internal Medicine

## 2015-10-15 NOTE — Telephone Encounter (Signed)
Mailed a calendar and letter for on year appointment

## 2015-10-17 ENCOUNTER — Other Ambulatory Visit: Payer: Self-pay | Admitting: Cardiovascular Disease

## 2015-10-21 ENCOUNTER — Other Ambulatory Visit: Payer: Medicare Other

## 2015-10-21 ENCOUNTER — Ambulatory Visit: Payer: Medicare Other | Admitting: Thoracic Surgery (Cardiothoracic Vascular Surgery)

## 2015-10-30 DIAGNOSIS — S81801S Unspecified open wound, right lower leg, sequela: Secondary | ICD-10-CM | POA: Diagnosis not present

## 2015-10-30 DIAGNOSIS — R600 Localized edema: Secondary | ICD-10-CM | POA: Diagnosis not present

## 2015-10-30 DIAGNOSIS — L98492 Non-pressure chronic ulcer of skin of other sites with fat layer exposed: Secondary | ICD-10-CM | POA: Diagnosis not present

## 2015-10-30 DIAGNOSIS — L97812 Non-pressure chronic ulcer of other part of right lower leg with fat layer exposed: Secondary | ICD-10-CM | POA: Diagnosis not present

## 2015-10-30 DIAGNOSIS — S81801D Unspecified open wound, right lower leg, subsequent encounter: Secondary | ICD-10-CM | POA: Diagnosis not present

## 2015-10-30 DIAGNOSIS — E119 Type 2 diabetes mellitus without complications: Secondary | ICD-10-CM | POA: Diagnosis not present

## 2015-10-30 DIAGNOSIS — I83018 Varicose veins of right lower extremity with ulcer other part of lower leg: Secondary | ICD-10-CM | POA: Diagnosis not present

## 2015-11-10 ENCOUNTER — Other Ambulatory Visit: Payer: Self-pay | Admitting: *Deleted

## 2015-11-10 DIAGNOSIS — I719 Aortic aneurysm of unspecified site, without rupture: Secondary | ICD-10-CM

## 2015-11-11 ENCOUNTER — Ambulatory Visit (INDEPENDENT_AMBULATORY_CARE_PROVIDER_SITE_OTHER): Payer: Medicare Other | Admitting: Thoracic Surgery (Cardiothoracic Vascular Surgery)

## 2015-11-11 ENCOUNTER — Other Ambulatory Visit: Payer: Self-pay | Admitting: Thoracic Surgery (Cardiothoracic Vascular Surgery)

## 2015-11-11 ENCOUNTER — Ambulatory Visit
Admission: RE | Admit: 2015-11-11 | Discharge: 2015-11-11 | Disposition: A | Discharge status: Home / Self Care | Payer: Medicare Other | Source: Ambulatory Visit | Attending: Thoracic Surgery (Cardiothoracic Vascular Surgery) | Admitting: Thoracic Surgery (Cardiothoracic Vascular Surgery)

## 2015-11-11 ENCOUNTER — Encounter: Payer: Self-pay | Admitting: Thoracic Surgery (Cardiothoracic Vascular Surgery)

## 2015-11-11 VITALS — BP 134/72 | HR 86 | Resp 18 | Ht 60.0 in | Wt 178.0 lb

## 2015-11-11 DIAGNOSIS — I712 Thoracic aortic aneurysm, without rupture, unspecified: Secondary | ICD-10-CM

## 2015-11-11 DIAGNOSIS — R918 Other nonspecific abnormal finding of lung field: Secondary | ICD-10-CM

## 2015-11-11 DIAGNOSIS — I719 Aortic aneurysm of unspecified site, without rupture: Secondary | ICD-10-CM | POA: Diagnosis not present

## 2015-11-11 DIAGNOSIS — Z8639 Personal history of other endocrine, nutritional and metabolic disease: Secondary | ICD-10-CM

## 2015-11-11 LAB — CREATININE, SERUM: CREATININE: 0.73 mg/dL (ref 0.60–0.88)

## 2015-11-11 NOTE — Progress Notes (Signed)
[address]       [place] [address]             [phone]       HPI: 82 yo woman  Returns for annual follow-up regarding an ascending aortic aneurysm.  [name] is an 82 yo woman with a complicated medical history including diabetes, hypertension, an ascending aortic aneurysm, hyperlipidemia, coronary artery disease, aortic insufficiency, COPD, carcinoid tumor resected by Dr. Arlyce Dice, degenerative joint disease, peripheral edema, and a nonhealing wound right leg. I have been following her for several years regarding her ascending aortic aneurysm. It has remained stable over time.  In the interim since her last visit she her daughter say that she has been doing fairly well. Her biggest complaint is the wound on her right leg which has been slow to heal. She is not having any chest pain. She does get short of breath with only small amounts of exertion, but that is unchanged.   She saw Dr. Julien Nordmann in November and had a CT of the chest at that time. She did not want to have another CT today.   Past Medical History  Diagnosis Date  . Diabetes mellitus   . Hypertension   . Coronary artery disease   . Aortic insufficiency   . Hyperlipidemia   . Aortic aneurysm ([place])   . Hypercholesterolemia   . COPD (chronic obstructive pulmonary disease) ([place])   . Bronchitis   . Edema   . GERD (gastroesophageal reflux disease)   . Aortic valve disease     stable  . Pain in joint, forearm   . Pain in joint, hand   . Pain in joint, lower leg   . Difficulty in walking(719.7)   . Disturbance of skin sensation   . Cancer [facility])       Current Outpatient Prescriptions  Medication Sig Dispense Refill  . ACCU-CHEK AVIVA PLUS test strip   5  . ACCU-CHEK SOFTCLIX LANCETS lancets   5  . albuterol (PROAIR HFA) 108 (90 BASE) MCG/ACT inhaler Inhale 2 puffs into the lungs every 6 (six) hours as needed for wheezing or shortness of breath.    Marland Kitchen aspirin EC 81 MG tablet Take 81 mg by  mouth every evening. 7 pm    . atorvastatin (LIPITOR) 10 MG tablet Take 10 mg by mouth at bedtime.  12  . BENICAR 40 MG tablet TAKE 1 TABLET (40 MG TOTAL) BY MOUTH 2 (TWO) TIMES DAILY. 60 tablet 11  . collagenase (SANTYL) ointment Apply in nickel thick layer to wound bed and change daily or as directed.    . diclofenac sodium (VOLTAREN) 1 % GEL APPLY 2 GRAMS TOPICALLY 4 (FOUR) TIMES DAILY. 300 g 1  . FeFum-FePoly-FA-B Cmp-C-Biot (INTEGRA PLUS) CAPS TAKE ONE CAPSULE BY MOUTH EVERY DAY *NOT COVERED* 30 capsule 5  . fluticasone (FLOVENT HFA) 220 MCG/ACT inhaler Inhale 1 puff into the lungs 2 (two) times daily.    . furosemide (LASIX) 20 MG tablet Take 1 tablet (20 mg total) by mouth daily as needed for fluid or edema. 30 tablet 11  . gabapentin (NEURONTIN) 300 MG capsule Take 1 capsule (300 mg total) by mouth 2 (two) times daily. 60 capsule 1  . Gauze Pads & Dressings (DRESSING SPONGES) 4"X4" PADS Provide 1 month supply for daily dressing change for the following dressing instruction - supply all the materials needed  Collagenase (Santyl) ointment in a 1/8 inch layer, top with single saline  moistened gauze 4x4 (opened). Cover with dry 4x4s and secure with tape 30 each 1  . Ginkgo Biloba 40 MG TABS Takes occasionally    . glimepiride (AMARYL) 1 MG tablet Take 1 mg by mouth daily.     . hydrALAZINE (APRESOLINE) 50 MG tablet Take 1 tablet (50 mg total) by mouth 3 (three) times daily. Take additional ( 25 mg ) up to three times a day if systolic bp is greater than 160 300 tablet 3  . hydrALAZINE (APRESOLINE) 50 MG tablet Take 1 tablet (50 mg total) by mouth 3 (three) times daily. Take additional ( 25 mg ) if bp is 160 or greater 300 tablet 3  . lidocaine (XYLOCAINE) 2 % jelly Apply topically.    . miconazole (ZEASORB-AF) 2 % powder Apply to affected area one time per day under breast and axillae     . montelukast (SINGULAIR) 10 MG tablet Take 10 mg by mouth at bedtime.  1  . Multiple Vitamin  (MULITIVITAMIN WITH MINERALS) TABS Take 1 tablet by mouth daily.    . nitroGLYCERIN (NITROSTAT) 0.4 MG SL tablet Place 0.4 mg under the tongue every 5 (five) minutes as needed for chest pain (3 DOSES MAX).    Marland Kitchen omeprazole (PRILOSEC) 20 MG capsule Take 20 mg by mouth daily before breakfast.  5  . oxyCODONE-acetaminophen (ROXICET) 5-325 MG tablet 1-2 tbs po q 4 hours only if needed for severe pain. Max 6 (six)  tbs 24 hours    . pioglitazone (ACTOS) 15 MG tablet Take 7.5 mg by mouth daily.     . potassium chloride (K-DUR,KLOR-CON) 10 MEQ tablet Take 20 mEq by mouth daily.    Marland Kitchen Spacer/Aero-Holding Chambers (AEROCHAMBER MINI CHAMBER) DEVI use as directed with your inhalers     . traMADol (ULTRAM) 50 MG tablet Take 1 tablet (50 mg total) by mouth every 6 (six) hours as needed. 120 tablet 5  . zinc oxide 20 % ointment Apply topically.     No current facility-administered medications for this visit.    Physical Exam BP 134/72 mmHg  Pulse 86  Resp 18  Ht 5' (1.524 m)  Wt 178 lb (80.74 kg)  BMI 34.76 kg/m2  SpO24 79% 82 year old woman in no acute distress Obese Alert and oriented 3 Cardiac regular rate and rhythm with 2/6 systolic and diastolic murmur Lungs clear bilaterally Right leg with 3-4+ edema, dressing in place, left leg 2+ edema  Diagnostic Tests: CT CHEST WITHOUT CONTRAST  TECHNIQUE: Multidetector CT imaging of the chest was performed following the standard protocol without IV contrast.  COMPARISON: CT 04/08/2015, 06/04/2014  FINDINGS: Mediastinum/Nodes: No axillary supraclavicular adenopathy. No mediastinal hilar adenopathy. No pericardial fluid. Stable dilatation ascending aorta to 5.0 cm. Intimal calcification aorta. No pericardial fluid. Esophagus normal.  Lungs/Pleura: Bilateral lower lobe nodules again demonstrated. Nodules are stable.  For example:  16 mm RIGHT lower lobe nodule in the azygos esophageal recess (image 38, series 5) compares to 17  mm.  Nodule in the RIGHT lower lobe measures 7 mm compared to 7 mm (image 32, series 5).  Nodule in the LEFT lower lobe measures 5 mm (image 35, series 5 not changed from 5 mm  Lesions in the upper lobes are smaller and unchanged. Cluster of nodules along the RIGHT horizontal fissure again noted (image 31, series 5).  Upper abdomen: Limited view of the liver, kidneys, pancreas are unremarkable. Normal adrenal glands.  Musculoskeletal: Stable degenerative changes of the thoracic spine.  IMPRESSION: 1. Stable  bilateral pulmonary nodules. 2. No change in ascending aortic aneurysm (5 cm).   Electronically Signed  By: Suzy Bouchard M.D.  On: 09/15/2015 15:22 I personally reviewed the CT chest and concur with the findings as noted above. The ascending aortic aneurysm and bilateral lung nodules are unchanged.  Impression: 82 year old woman with multiple medical problems including a 5 cm ascending aortic aneurysm and bilateral lung nodules.  Ascending aortic aneurysm- 5 cm. This is been unchanged for several years. It is large for her size, but she is a poor operative candidate and I do not feel she is a candidate for an aortic root, ascending and possibly arch replacement.  Multiple lung nodules- stable. She does have a history of carcinoid tumor. Dr. Inda Merlin is following her for the lung nodules at this point. She will have another scan in a year.  Hypertension- stable on current regimen  Plan: I will plan to see her back in about a year. I'll try to schedule it around the time of Dr. Worthy Flank CT scan so that we avoid duplicating radiologic studies.  I spent 10 minutes with Mrs. Hope during this visit, greater than 50% spent in counseling  Melrose Nakayama, MD [facility] [phone]

## 2015-11-13 DIAGNOSIS — R600 Localized edema: Secondary | ICD-10-CM | POA: Diagnosis not present

## 2015-11-13 DIAGNOSIS — S81801S Unspecified open wound, right lower leg, sequela: Secondary | ICD-10-CM | POA: Diagnosis not present

## 2015-11-13 DIAGNOSIS — I83018 Varicose veins of right lower extremity with ulcer other part of lower leg: Secondary | ICD-10-CM | POA: Diagnosis not present

## 2015-11-13 DIAGNOSIS — L97812 Non-pressure chronic ulcer of other part of right lower leg with fat layer exposed: Secondary | ICD-10-CM | POA: Diagnosis not present

## 2015-11-13 DIAGNOSIS — E119 Type 2 diabetes mellitus without complications: Secondary | ICD-10-CM | POA: Diagnosis not present

## 2015-11-13 DIAGNOSIS — L98492 Non-pressure chronic ulcer of skin of other sites with fat layer exposed: Secondary | ICD-10-CM | POA: Diagnosis not present

## 2015-11-13 DIAGNOSIS — S81801D Unspecified open wound, right lower leg, subsequent encounter: Secondary | ICD-10-CM | POA: Diagnosis not present

## 2015-11-18 ENCOUNTER — Other Ambulatory Visit: Payer: Self-pay

## 2015-11-18 ENCOUNTER — Ambulatory Visit (HOSPITAL_COMMUNITY): Payer: Medicare Other | Attending: Cardiovascular Disease

## 2015-11-18 DIAGNOSIS — I517 Cardiomegaly: Secondary | ICD-10-CM | POA: Insufficient documentation

## 2015-11-18 DIAGNOSIS — I371 Nonrheumatic pulmonary valve insufficiency: Secondary | ICD-10-CM | POA: Diagnosis not present

## 2015-11-18 DIAGNOSIS — E119 Type 2 diabetes mellitus without complications: Secondary | ICD-10-CM | POA: Insufficient documentation

## 2015-11-18 DIAGNOSIS — I351 Nonrheumatic aortic (valve) insufficiency: Secondary | ICD-10-CM | POA: Diagnosis not present

## 2015-11-18 DIAGNOSIS — I071 Rheumatic tricuspid insufficiency: Secondary | ICD-10-CM | POA: Diagnosis not present

## 2015-11-18 DIAGNOSIS — R011 Cardiac murmur, unspecified: Secondary | ICD-10-CM | POA: Insufficient documentation

## 2015-11-18 DIAGNOSIS — I340 Nonrheumatic mitral (valve) insufficiency: Secondary | ICD-10-CM | POA: Insufficient documentation

## 2015-11-18 DIAGNOSIS — I10 Essential (primary) hypertension: Secondary | ICD-10-CM | POA: Diagnosis not present

## 2015-11-18 DIAGNOSIS — E785 Hyperlipidemia, unspecified: Secondary | ICD-10-CM | POA: Insufficient documentation

## 2015-11-20 ENCOUNTER — Telehealth: Payer: Self-pay | Admitting: Cardiovascular Disease

## 2015-11-20 NOTE — Telephone Encounter (Signed)
Returning your call. °

## 2015-11-20 NOTE — Telephone Encounter (Signed)
Patient and her daughter are aware of echo results. Patient and daughter also confirmed their next appointment with Dr. Johnsie Cancel.

## 2015-11-24 DIAGNOSIS — L84 Corns and callosities: Secondary | ICD-10-CM | POA: Diagnosis not present

## 2015-11-24 DIAGNOSIS — E1151 Type 2 diabetes mellitus with diabetic peripheral angiopathy without gangrene: Secondary | ICD-10-CM | POA: Diagnosis not present

## 2015-11-24 DIAGNOSIS — I739 Peripheral vascular disease, unspecified: Secondary | ICD-10-CM | POA: Diagnosis not present

## 2015-11-24 DIAGNOSIS — L603 Nail dystrophy: Secondary | ICD-10-CM | POA: Diagnosis not present
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
## 2015-11-25 ENCOUNTER — Encounter: Payer: Self-pay | Admitting: Cardiovascular Disease

## 2015-11-25 DIAGNOSIS — E119 Type 2 diabetes mellitus without complications: Secondary | ICD-10-CM | POA: Diagnosis not present

## 2015-11-26 ENCOUNTER — Encounter: Payer: Self-pay | Admitting: Vascular Surgery

## 2015-11-26 ENCOUNTER — Other Ambulatory Visit: Payer: Self-pay | Admitting: Internal Medicine

## 2015-11-26 DIAGNOSIS — E119 Type 2 diabetes mellitus without complications: Secondary | ICD-10-CM | POA: Diagnosis not present

## 2015-11-26 DIAGNOSIS — L98492 Non-pressure chronic ulcer of skin of other sites with fat layer exposed: Secondary | ICD-10-CM | POA: Diagnosis not present

## 2015-11-26 DIAGNOSIS — S81801S Unspecified open wound, right lower leg, sequela: Secondary | ICD-10-CM | POA: Diagnosis not present

## 2015-11-26 DIAGNOSIS — S81801D Unspecified open wound, right lower leg, subsequent encounter: Secondary | ICD-10-CM | POA: Diagnosis not present

## 2015-11-26 DIAGNOSIS — I83018 Varicose veins of right lower extremity with ulcer other part of lower leg: Secondary | ICD-10-CM | POA: Diagnosis not present

## 2015-11-26 DIAGNOSIS — R600 Localized edema: Secondary | ICD-10-CM | POA: Diagnosis not present

## 2015-11-26 DIAGNOSIS — L97812 Non-pressure chronic ulcer of other part of right lower leg with fat layer exposed: Secondary | ICD-10-CM | POA: Diagnosis not present

## 2015-11-27 ENCOUNTER — Encounter: Payer: Self-pay | Admitting: Cardiovascular Disease

## 2015-11-27 ENCOUNTER — Ambulatory Visit (INDEPENDENT_AMBULATORY_CARE_PROVIDER_SITE_OTHER): Payer: Medicare Other | Admitting: Cardiovascular Disease

## 2015-11-27 VITALS — BP 100/50 | HR 77 | Ht 60.0 in | Wt 162.1 lb

## 2015-11-27 DIAGNOSIS — R011 Cardiac murmur, unspecified: Secondary | ICD-10-CM

## 2015-11-27 DIAGNOSIS — I10 Essential (primary) hypertension: Secondary | ICD-10-CM | POA: Diagnosis not present

## 2015-11-27 NOTE — Patient Instructions (Signed)

## 2015-12-02 ENCOUNTER — Encounter: Payer: Medicare Other | Admitting: Vascular Surgery

## 2015-12-02 ENCOUNTER — Encounter (HOSPITAL_COMMUNITY): Payer: Medicare Other

## 2015-12-08 DIAGNOSIS — R52 Pain, unspecified: Secondary | ICD-10-CM | POA: Diagnosis not present

## 2015-12-08 DIAGNOSIS — I872 Venous insufficiency (chronic) (peripheral): Secondary | ICD-10-CM | POA: Diagnosis not present

## 2015-12-08 DIAGNOSIS — L98492 Non-pressure chronic ulcer of skin of other sites with fat layer exposed: Secondary | ICD-10-CM | POA: Diagnosis not present

## 2015-12-10 DIAGNOSIS — L98492 Non-pressure chronic ulcer of skin of other sites with fat layer exposed: Secondary | ICD-10-CM | POA: Diagnosis not present

## 2015-12-11 DIAGNOSIS — E7800 Pure hypercholesterolemia, unspecified: Secondary | ICD-10-CM | POA: Diagnosis not present

## 2015-12-11 DIAGNOSIS — E1169 Type 2 diabetes mellitus with other specified complication: Secondary | ICD-10-CM | POA: Diagnosis not present

## 2015-12-16 DIAGNOSIS — E1169 Type 2 diabetes mellitus with other specified complication: Secondary | ICD-10-CM | POA: Diagnosis not present

## 2015-12-16 DIAGNOSIS — I2510 Atherosclerotic heart disease of native coronary artery without angina pectoris: Secondary | ICD-10-CM | POA: Diagnosis not present

## 2015-12-16 DIAGNOSIS — E7800 Pure hypercholesterolemia, unspecified: Secondary | ICD-10-CM | POA: Diagnosis not present

## 2015-12-16 DIAGNOSIS — I350 Nonrheumatic aortic (valve) stenosis: Secondary | ICD-10-CM | POA: Diagnosis not present

## 2015-12-16 DIAGNOSIS — I10 Essential (primary) hypertension: Secondary | ICD-10-CM | POA: Diagnosis not present

## 2015-12-22 ENCOUNTER — Other Ambulatory Visit: Payer: Self-pay | Admitting: Internal Medicine

## 2015-12-22 DIAGNOSIS — L97812 Non-pressure chronic ulcer of other part of right lower leg with fat layer exposed: Secondary | ICD-10-CM | POA: Diagnosis not present

## 2015-12-22 DIAGNOSIS — E119 Type 2 diabetes mellitus without complications: Secondary | ICD-10-CM | POA: Diagnosis not present

## 2015-12-22 DIAGNOSIS — I83018 Varicose veins of right lower extremity with ulcer other part of lower leg: Secondary | ICD-10-CM | POA: Diagnosis not present

## 2015-12-22 DIAGNOSIS — L98492 Non-pressure chronic ulcer of skin of other sites with fat layer exposed: Secondary | ICD-10-CM | POA: Diagnosis not present

## 2015-12-22 DIAGNOSIS — R600 Localized edema: Secondary | ICD-10-CM | POA: Diagnosis not present

## 2015-12-22 DIAGNOSIS — S81801S Unspecified open wound, right lower leg, sequela: Secondary | ICD-10-CM | POA: Diagnosis not present

## 2015-12-22 DIAGNOSIS — S81801D Unspecified open wound, right lower leg, subsequent encounter: Secondary | ICD-10-CM | POA: Diagnosis not present

## 2016-01-03 ENCOUNTER — Emergency Department (HOSPITAL_COMMUNITY)
Admission: EM | Admit: 2016-01-03 | Discharge: 2016-01-03 | Disposition: A | Discharge status: Home / Self Care | Payer: Medicare Other | Attending: Emergency Medicine | Admitting: Emergency Medicine

## 2016-01-03 ENCOUNTER — Encounter (HOSPITAL_COMMUNITY): Payer: Self-pay | Admitting: Emergency Medicine

## 2016-01-03 DIAGNOSIS — K219 Gastro-esophageal reflux disease without esophagitis: Secondary | ICD-10-CM | POA: Insufficient documentation

## 2016-01-03 DIAGNOSIS — Z79899 Other long term (current) drug therapy: Secondary | ICD-10-CM | POA: Insufficient documentation

## 2016-01-03 DIAGNOSIS — R350 Frequency of micturition: Secondary | ICD-10-CM | POA: Diagnosis not present

## 2016-01-03 DIAGNOSIS — Z859 Personal history of malignant neoplasm, unspecified: Secondary | ICD-10-CM | POA: Diagnosis not present

## 2016-01-03 DIAGNOSIS — Z791 Long term (current) use of non-steroidal anti-inflammatories (NSAID): Secondary | ICD-10-CM | POA: Diagnosis not present

## 2016-01-03 DIAGNOSIS — Z7951 Long term (current) use of inhaled steroids: Secondary | ICD-10-CM | POA: Diagnosis not present

## 2016-01-03 DIAGNOSIS — E785 Hyperlipidemia, unspecified: Secondary | ICD-10-CM | POA: Diagnosis not present

## 2016-01-03 DIAGNOSIS — Z7982 Long term (current) use of aspirin: Secondary | ICD-10-CM | POA: Insufficient documentation

## 2016-01-03 DIAGNOSIS — I2510 Atherosclerotic heart disease of native coronary artery without angina pectoris: Secondary | ICD-10-CM | POA: Diagnosis not present

## 2016-01-03 DIAGNOSIS — J449 Chronic obstructive pulmonary disease, unspecified: Secondary | ICD-10-CM | POA: Diagnosis not present

## 2016-01-03 DIAGNOSIS — E119 Type 2 diabetes mellitus without complications: Secondary | ICD-10-CM | POA: Insufficient documentation

## 2016-01-03 DIAGNOSIS — E7800 Pure hypercholesterolemia, unspecified: Secondary | ICD-10-CM | POA: Diagnosis not present

## 2016-01-03 DIAGNOSIS — Z7984 Long term (current) use of oral hypoglycemic drugs: Secondary | ICD-10-CM | POA: Diagnosis not present

## 2016-01-03 DIAGNOSIS — I10 Essential (primary) hypertension: Secondary | ICD-10-CM | POA: Diagnosis not present

## 2016-01-03 LAB — BASIC METABOLIC PANEL
Anion gap: 11 (ref 5–15)
BUN: 29 mg/dL — AB (ref 6–20)
CALCIUM: 9.1 mg/dL (ref 8.9–10.3)
CO2: 23 mmol/L (ref 22–32)
Chloride: 100 mmol/L — ABNORMAL LOW (ref 101–111)
Creatinine, Ser: 0.67 mg/dL (ref 0.44–1.00)
GFR calc Af Amer: >60 mL/min (ref >60)
GLUCOSE: 167 mg/dL — AB (ref 65–99)
Potassium: 4 mmol/L (ref 3.5–5.1)
SODIUM: 134 mmol/L — AB (ref 135–145)

## 2016-01-03 LAB — CBC
HCT: 39.5 % (ref 36.0–46.0)
Hemoglobin: 12.4 g/dL (ref 12.0–15.0)
MCH: 28.1 pg (ref 26.0–34.0)
MCHC: 31.4 g/dL (ref 30.0–36.0)
MCV: 89.6 fL (ref 78.0–100.0)
PLATELETS: 262 10*3/uL (ref 150–400)
RBC: 4.41 MIL/uL (ref 3.87–5.11)
RDW: 14.9 % (ref 11.5–15.5)
WBC: 7.6 10*3/uL (ref 4.0–10.5)

## 2016-01-03 LAB — URINALYSIS, ROUTINE W REFLEX MICROSCOPIC
BILIRUBIN URINE: NEGATIVE
Glucose, UA: NEGATIVE mg/dL
Hgb urine dipstick: NEGATIVE
KETONES UR: NEGATIVE mg/dL
NITRITE: NEGATIVE
Protein, ur: NEGATIVE mg/dL
SPECIFIC GRAVITY, URINE: 1.014 (ref 1.005–1.030)
pH: 6.5 (ref 5.0–8.0)

## 2016-01-03 LAB — URINE MICROSCOPIC-ADD ON: Bacteria, UA: NONE SEEN

## 2016-01-03 NOTE — ED Notes (Signed)
Room in [place] became available patient offered room and agreeable to plan.

## 2016-01-03 NOTE — ED Notes (Signed)
Pt and pts family  Upset about the wait in triage.  Explanation given  But there is a language barrier.  i dont think  They understood after 15 minutes of attempting to explain

## 2016-01-03 NOTE — ED Provider Notes (Signed)
CSN: [id number]     Arrival date & time 01/03/16  1301 History   First MD Initiated Contact with Patient 01/03/16 1618     Chief Complaint  Patient presents with  . Urinary Frequency     (Consider location/radiation/quality/duration/timing/severity/associated sxs/prior Treatment) HPI  Pt presenting with c/o urinary frequency.  Pt and her daughter state that this is not a new problem, but that she has been urinating frequently throughout the day and is at times not able to hold her urine.  No fever/chills.  No vomiting or abdominal pain.  She states the problem has been going on for several months.  Per chart review- pt was seen by cardiology 1/17- in this note it states she has had changes to her bp meds- including increase of hctz to '50mg'$  TID.  This has been helping to control her blood pressure.  No burning with urination, no cloudy urine or change in odor of urine.  There are no other associated systemic symptoms, there are no other alleviating or modifying factors.   Past Medical History  Diagnosis Date  . Diabetes mellitus   . Hypertension   . Coronary artery disease   . Aortic insufficiency   . Hyperlipidemia   . Aortic aneurysm ([place])   . Hypercholesterolemia   . COPD (chronic obstructive pulmonary disease) ([place])   . Bronchitis   . Edema   . GERD (gastroesophageal reflux disease)   . Aortic valve disease     stable  . Pain in joint, forearm   . Pain in joint, hand   . Pain in joint, lower leg   . Difficulty in walking(719.7)   . Disturbance of skin sensation   . Cancer [facility])    Past Surgical History  Procedure Laterality Date  . Pars plana vitrectomy      right eye  . Posterior capsulectomy      right eye  . Pars plana vitrectomy w/ endophotocoagulation      right eye  . Fiberoptic bronch with endobronchial u/s  07/02/2010  . Video bronchoscopy  12/09/2009  . 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 Bronch with  endobronchial biopies  09/15/2009  . Colonoscopy N/A 09/13/2013    Procedure: COLONOSCOPY;  Surgeon: Jeryl Columbia, MD;  Location: [facility];  Service: Endoscopy;  Laterality: N/A;   Family History  Problem Relation Age of Onset  . Hypertension Mother   . Hypertension Father   . Heart attack Neg Hx   . Stroke Neg Hx    Social History  Substance Use Topics  . Smoking status: Never Smoker   . Smokeless tobacco: Never Used  . Alcohol Use: No   OB History    Gravida Para Term Preterm AB TAB SAB Ectopic Multiple Living   '2 1 1  1  1   1     '$ Review of Systems  ROS reviewed and all otherwise negative except for mentioned in HPI    Allergies  Review of patient's allergies indicates no known allergies.  Home Medications   Prior to Admission medications   Medication Sig Start Date End Date Taking? Authorizing Provider  albuterol (PROVENTIL HFA;VENTOLIN HFA) 108 (90 Base) MCG/ACT inhaler Inhale 2 puffs into the lungs every 6 (six) hours as needed for wheezing or shortness of breath.    Historical Provider, MD  amLODipine (NORVASC) 10 MG tablet Take 10 mg by mouth daily. 10/29/15   Historical Provider, MD  aspirin EC 81  MG tablet Take 81 mg by mouth every evening. 7 pm    Historical Provider, MD  atorvastatin (LIPITOR) 10 MG tablet Take 10 mg by mouth at bedtime. 06/17/15   Historical Provider, MD  BENICAR 40 MG tablet TAKE 1 TABLET (40 MG TOTAL) BY MOUTH 2 (TWO) TIMES DAILY. 10/21/15   Josue Hector, MD  diclofenac sodium (VOLTAREN) 1 % GEL APPLY 2 GRAMS TOPICALLY 4 (FOUR) TIMES DAILY. 09/23/15   Meredith Staggers, MD  FeFum-FePoly-FA-B Cmp-C-Biot (INTEGRA PLUS) CAPS TAKE ONE CAPSULE BY MOUTH EVERY DAY *NOT COVERED* 12/22/15   Curt Bears, MD  fluticasone (FLOVENT HFA) 220 MCG/ACT inhaler Inhale 1 puff into the lungs 2 (two) times daily.    Historical Provider, MD  furosemide (LASIX) 20 MG tablet Take 1 tablet (20 mg total) by mouth daily as needed for fluid or edema. 08/29/15   Josue Hector, MD  glimepiride (AMARYL) 1 MG tablet Take 1 mg by mouth daily.     Historical Provider, MD  hydrALAZINE (APRESOLINE) 50 MG tablet Take 1 tablet (50 mg total) by mouth 3 (three) times daily. Take additional ( 25 mg ) up to three times a day if systolic bp is greater than 160 10/10/15   Almyra Deforest, PA  montelukast (SINGULAIR) 10 MG tablet Take 10 mg by mouth at bedtime. 06/04/15   Historical Provider, MD  Multiple Vitamin (MULITIVITAMIN WITH MINERALS) TABS Take 1 tablet by mouth daily.    Historical Provider, MD  nitroGLYCERIN (NITROSTAT) 0.4 MG SL tablet Place 0.4 mg under the tongue every 5 (five) minutes as needed for chest pain (3 DOSES MAX).    Historical Provider, MD  Omega-3 Fatty Acids (FISH OIL) 1200 MG CAPS Take 1 capsule by mouth daily.    Historical Provider, MD  omeprazole (PRILOSEC) 20 MG capsule Take 20 mg by mouth daily before breakfast. 06/04/15   Historical Provider, MD  pioglitazone (ACTOS) 15 MG tablet Take 7.5 mg by mouth daily.     Historical Provider, MD  potassium chloride (K-DUR,KLOR-CON) 10 MEQ tablet Take 20 mEq by mouth daily.    Historical Provider, MD  Probiotic Product (PROBIOTIC PO) Take 1 tablet by mouth daily.    Historical Provider, MD  Spacer/Aero-Holding Chambers (AEROCHAMBER MINI CHAMBER) DEVI use as directed with your inhalers  04/27/11   Historical Provider, MD   BP 125/61 mmHg  Pulse 70  Temp(Src) 98.1 F (36.7 C) (Oral)  Resp 18  Ht '5\' 3"'$  (1.6 m)  Wt 74.798 kg  BMI 29.22 kg/m2  SpO2 97%  Vitals reviewed Physical Exam  Physical Examination: General appearance - alert, well appearing, and in no distress Mental status - alert, oriented to person, place, and time Eyes - no conjunctival injection no scleral icterus Chest - clear to auscultation, no wheezes, rales or rhonchi, symmetric air entry Heart - normal rate, regular rhythm, normal S1, S2, no murmurs, rubs, clicks or gallops Abdomen - soft, nontender, nondistended, no masses or organomegaly,  nontender Neurological - alert, oriented, normal speech Extremities - peripheral pulses normal, no pedal edema, no clubbing or cyanosis Skin - normal coloration and turgor, no rashes  ED Course  Procedures (including critical care time) Labs Review Labs Reviewed  URINALYSIS, ROUTINE W REFLEX MICROSCOPIC (NOT AT [facility]) - Abnormal; Notable for the following:    APPearance CLOUDY (*)    Leukocytes, UA TRACE (*)    All other components within normal limits  URINE MICROSCOPIC-ADD ON - Abnormal; Notable for the following:  Squamous Epithelial / LPF 6-30 (*)    All other components within normal limits  BASIC METABOLIC PANEL - Abnormal; Notable for the following:    Sodium 134 (*)    Chloride 100 (*)    Glucose, Bld 167 (*)    BUN 29 (*)    All other components within normal limits  URINE CULTURE  CBC    Imaging Review No results found. I have personally reviewed and evaluated these images and lab results as part of my medical decision-making.   EKG Interpretation None      MDM   Final diagnoses:  Urinary frequency    Pt presenting with c/o urinary frequency for the past several months.  She has no evidence of UTI, labs are reassuring.  Urine culture is pending.  She recently had some medication changes for BP control including increased HCTZ.  I feel this is the most likely cause of her urinary frequency.  Her BP is under reasonable control tonight in the ED.  I advised her to f/u with PMD and with cardiology to discuss her BP meds- to see if there is a change that could be made in her regimen.  Discharged with strict return precautions.  Pt agreeable with plan.  4:29 PM went to see patient, not yet in room  Alfonzo Beers, MD 01/03/16 1941

## 2016-01-03 NOTE — ED Notes (Signed)
Pt refused wheelchair. Walked with pt to waiting room no distress noted.

## 2016-01-03 NOTE — Discharge Instructions (Signed)
Return to the ED with any concerns including fever/chills, abdominal pain, fainting, decreased level of alertness/lethargy, or any other alarming symptoms

## 2016-01-03 NOTE — ED Notes (Signed)
Pt c/o urinary frequency ongoing for 2 months. Pt denies odor.

## 2016-01-03 NOTE — ED Notes (Addendum)
Patient and family member informed that she will be taken to hallway bed and refused wheel chair multiple times. When patient and family member arrived to [facility] bed in [place] patient family member yelling and refusing hallway bed. Charge nurse notified and RN informed patient could be returned to waiting room to next available room to be seen. When escorting patient back to waiting room then patient family member states she will now be willing to be seen in hallway but would like to speak to charge nurse.

## 2016-01-05 DIAGNOSIS — L98492 Non-pressure chronic ulcer of skin of other sites with fat layer exposed: Secondary | ICD-10-CM | POA: Diagnosis not present

## 2016-01-05 DIAGNOSIS — R600 Localized edema: Secondary | ICD-10-CM | POA: Diagnosis not present

## 2016-01-05 DIAGNOSIS — T148 Other injury of unspecified body region: Secondary | ICD-10-CM | POA: Diagnosis not present

## 2016-01-05 DIAGNOSIS — L97812 Non-pressure chronic ulcer of other part of right lower leg with fat layer exposed: Secondary | ICD-10-CM | POA: Diagnosis not present

## 2016-01-05 DIAGNOSIS — I83018 Varicose veins of right lower extremity with ulcer other part of lower leg: Secondary | ICD-10-CM | POA: Diagnosis not present

## 2016-01-05 DIAGNOSIS — S81801D Unspecified open wound, right lower leg, subsequent encounter: Secondary | ICD-10-CM | POA: Diagnosis not present

## 2016-01-05 DIAGNOSIS — S81801S Unspecified open wound, right lower leg, sequela: Secondary | ICD-10-CM | POA: Diagnosis not present

## 2016-01-05 DIAGNOSIS — I878 Other specified disorders of veins: Secondary | ICD-10-CM | POA: Diagnosis not present

## 2016-01-05 DIAGNOSIS — E119 Type 2 diabetes mellitus without complications: Secondary | ICD-10-CM | POA: Diagnosis not present

## 2016-01-05 LAB — URINE CULTURE

## 2016-01-08 ENCOUNTER — Telehealth: Payer: Self-pay | Admitting: Cardiovascular Disease

## 2016-01-08 NOTE — Telephone Encounter (Signed)
New message      Talk to a nurse

## 2016-01-08 NOTE — Telephone Encounter (Signed)
Spoke with patient's daughter who called to ask about patient's most recent visit to ED.  Per ED MD: Urinary frequency    Pt presenting with c/o urinary frequency for the past several months. She has no evidence of UTI, labs are reassuring. Urine culture is pending. She recently had some medication changes for BP control including increased HCTZ. I feel this is the most likely cause of her urinary frequency. Her BP is under reasonable control tonight in the ED. I advised her to f/u with PMD and with cardiology to discuss her BP meds- to see if there is a change that could be made in her regimen. Discharged with strict return precautions. Pt agreeable with plan.      I advised daughter that Dr. Johnsie Cancel is out of the office until Monday.  She states she will call back Monday.  I advised her that I will forward message to Dr. Johnsie Cancel and his primary nurse, Howie Ill, RN for call back next week.  She verbalized understanding and agreement.

## 2016-01-08 NOTE — Telephone Encounter (Signed)
Left message for call back.

## 2016-01-13 NOTE — Telephone Encounter (Signed)
Informed patient and her daughter of Dr. Kyla Balzarine recommendation. Patient's daughter stated she would try that solution.

## 2016-01-13 NOTE — Telephone Encounter (Signed)
Patient and her daughter calling about patient having urinary frequency, and they think it is due to one of her BP medications. Patient's wants to know if there is a substitute for this medication that is causing her to urinate frequently. At times patient has some incontinents with the urinary frequency, and this is causing problems for the patient and her daughter. It is very hard to understanding patient and her daughter, due to language barrier. Repeated questions and answer to make sure the clarity of the situation was understood. Will forward to Dr. Johnsie Cancel for advisement.

## 2016-01-13 NOTE — Telephone Encounter (Signed)
She can hold her HCTZ but will have to take it if BP goes up or edema

## 2016-01-14 ENCOUNTER — Encounter: Payer: Self-pay | Admitting: Vascular Surgery

## 2016-01-19 DIAGNOSIS — S81801D Unspecified open wound, right lower leg, subsequent encounter: Secondary | ICD-10-CM | POA: Diagnosis not present

## 2016-01-19 DIAGNOSIS — I878 Other specified disorders of veins: Secondary | ICD-10-CM | POA: Diagnosis not present

## 2016-01-19 DIAGNOSIS — T148 Other injury of unspecified body region: Secondary | ICD-10-CM | POA: Diagnosis not present

## 2016-01-19 DIAGNOSIS — L97812 Non-pressure chronic ulcer of other part of right lower leg with fat layer exposed: Secondary | ICD-10-CM | POA: Diagnosis not present

## 2016-01-19 DIAGNOSIS — R600 Localized edema: Secondary | ICD-10-CM | POA: Diagnosis not present

## 2016-01-19 DIAGNOSIS — E119 Type 2 diabetes mellitus without complications: Secondary | ICD-10-CM | POA: Diagnosis not present

## 2016-01-19 DIAGNOSIS — L98492 Non-pressure chronic ulcer of skin of other sites with fat layer exposed: Secondary | ICD-10-CM | POA: Diagnosis not present

## 2016-01-19 DIAGNOSIS — I83018 Varicose veins of right lower extremity with ulcer other part of lower leg: Secondary | ICD-10-CM | POA: Diagnosis not present

## 2016-01-19 DIAGNOSIS — S81801S Unspecified open wound, right lower leg, sequela: Secondary | ICD-10-CM | POA: Diagnosis not present

## 2016-01-20 ENCOUNTER — Ambulatory Visit (INDEPENDENT_AMBULATORY_CARE_PROVIDER_SITE_OTHER): Payer: Medicare Other | Admitting: Vascular Surgery

## 2016-01-20 ENCOUNTER — Encounter: Payer: Self-pay | Admitting: Vascular Surgery

## 2016-01-20 ENCOUNTER — Ambulatory Visit (HOSPITAL_COMMUNITY)
Admission: RE | Admit: 2016-01-20 | Discharge: 2016-01-20 | Disposition: A | Discharge status: Home / Self Care | Payer: Medicare Other | Source: Ambulatory Visit | Attending: Vascular Surgery | Admitting: Vascular Surgery

## 2016-01-20 VITALS — BP 160/72 | HR 95 | Temp 98.0°F | Resp 16 | Ht 64.0 in | Wt 160.0 lb

## 2016-01-20 DIAGNOSIS — I2510 Atherosclerotic heart disease of native coronary artery without angina pectoris: Secondary | ICD-10-CM | POA: Diagnosis not present

## 2016-01-20 DIAGNOSIS — E785 Hyperlipidemia, unspecified: Secondary | ICD-10-CM | POA: Insufficient documentation

## 2016-01-20 DIAGNOSIS — I83891 Varicose veins of right lower extremities with other complications: Secondary | ICD-10-CM | POA: Diagnosis not present

## 2016-01-20 DIAGNOSIS — I83813 Varicose veins of bilateral lower extremities with pain: Secondary | ICD-10-CM

## 2016-01-20 DIAGNOSIS — J449 Chronic obstructive pulmonary disease, unspecified: Secondary | ICD-10-CM | POA: Insufficient documentation

## 2016-01-20 DIAGNOSIS — E119 Type 2 diabetes mellitus without complications: Secondary | ICD-10-CM | POA: Diagnosis not present

## 2016-01-20 DIAGNOSIS — R609 Edema, unspecified: Secondary | ICD-10-CM | POA: Diagnosis present

## 2016-01-20 DIAGNOSIS — K219 Gastro-esophageal reflux disease without esophagitis: Secondary | ICD-10-CM | POA: Insufficient documentation

## 2016-01-20 DIAGNOSIS — I10 Essential (primary) hypertension: Secondary | ICD-10-CM | POA: Diagnosis not present

## 2016-01-20 NOTE — Progress Notes (Signed)
Subjective:     Patient ID: [name], female   DOB: 09/23/1932, 82 y.o.   MRN: [id number]  HPI This 82 year old female who speaks [place] is a comment by her daughter who serves as an Astronomer. She was referred by the wound center. She has history of an extensive venous stasis ulcer in the right leg which has improved significantly with wound care. It has not healed. She has never had procedures on her legs in the past. She has no history of DVT thrombophlebitis or other complications. She has no history of problems with the contralateral left leg. She does have chronic edema in the right angle. She cannot wear elastic compression stockings.  Past Medical History  Diagnosis Date  . Diabetes mellitus   . Hypertension   . Coronary artery disease   . Aortic insufficiency   . Hyperlipidemia   . Aortic aneurysm ([place])   . Hypercholesterolemia   . COPD (chronic obstructive pulmonary disease) ([place])   . Bronchitis   . Edema   . GERD (gastroesophageal reflux disease)   . Aortic valve disease     stable  . Pain in joint, forearm   . Pain in joint, hand   . Pain in joint, lower leg   . Difficulty in walking(719.7)   . Disturbance of skin sensation   . Cancer [facility])     Social History  Substance Use Topics  . Smoking status: Never Smoker   . Smokeless tobacco: Never Used  . Alcohol Use: No    Family History  Problem Relation Age of Onset  . Hypertension Mother   . Hypertension Father   . Heart attack Neg Hx   . Stroke Neg Hx     No Known Allergies   Current outpatient prescriptions:  .  albuterol (PROVENTIL HFA;VENTOLIN HFA) 108 (90 Base) MCG/ACT inhaler, Inhale 2 puffs into the lungs every 6 (six) hours as needed for wheezing or shortness of breath., Disp: , Rfl:  .  amLODipine (NORVASC) 10 MG tablet, Take 10 mg by mouth daily., Disp: , Rfl: 3 .  aspirin EC 81 MG tablet, Take 81 mg by mouth every evening. 7 pm, Disp: , Rfl:  .  atorvastatin (LIPITOR) 10 MG tablet, Take 10  mg by mouth at bedtime., Disp: , Rfl: 12 .  BENICAR 40 MG tablet, TAKE 1 TABLET (40 MG TOTAL) BY MOUTH 2 (TWO) TIMES DAILY., Disp: 60 tablet, Rfl: 11 .  diclofenac sodium (VOLTAREN) 1 % GEL, APPLY 2 GRAMS TOPICALLY 4 (FOUR) TIMES DAILY., Disp: 300 g, Rfl: 1 .  FeFum-FePoly-FA-B Cmp-C-Biot (INTEGRA PLUS) CAPS, TAKE ONE CAPSULE BY MOUTH EVERY DAY *NOT COVERED*, Disp: 30 capsule, Rfl: 5 .  furosemide (LASIX) 20 MG tablet, Take 1 tablet (20 mg total) by mouth daily as needed for fluid or edema., Disp: 30 tablet, Rfl: 11 .  glimepiride (AMARYL) 1 MG tablet, Take 1 mg by mouth daily. , Disp: , Rfl:  .  montelukast (SINGULAIR) 10 MG tablet, Take 10 mg by mouth at bedtime., Disp: , Rfl: 1 .  Multiple Vitamin (MULITIVITAMIN WITH MINERALS) TABS, Take 1 tablet by mouth daily., Disp: , Rfl:  .  nitroGLYCERIN (NITROSTAT) 0.4 MG SL tablet, Place 0.4 mg under the tongue every 5 (five) minutes as needed for chest pain (3 DOSES MAX)., Disp: , Rfl:  .  Omega-3 Fatty Acids (FISH OIL) 1200 MG CAPS, Take 1 capsule by mouth daily., Disp: , Rfl:  .  omeprazole (PRILOSEC) 20 MG capsule, Take 20  mg by mouth daily before breakfast., Disp: , Rfl: 5 .  pioglitazone (ACTOS) 15 MG tablet, Take 7.5 mg by mouth daily. , Disp: , Rfl:  .  potassium chloride (K-DUR,KLOR-CON) 10 MEQ tablet, Take 20 mEq by mouth daily., Disp: , Rfl:  .  Probiotic Product (PROBIOTIC PO), Take 1 tablet by mouth daily., Disp: , Rfl:  .  Spacer/Aero-Holding Chambers (AEROCHAMBER MINI CHAMBER) DEVI, use as directed with your inhalers , Disp: , Rfl:  .  fluticasone (FLOVENT HFA) 220 MCG/ACT inhaler, Inhale 1 puff into the lungs 2 (two) times daily. Reported on 01/20/2016, Disp: , Rfl:  .  hydrALAZINE (APRESOLINE) 50 MG tablet, Take 1 tablet (50 mg total) by mouth 3 (three) times daily. Take additional ( 25 mg ) up to three times a day if systolic bp is greater than 160 (Patient not taking: Reported on 01/20/2016), Disp: 300 tablet, Rfl: 3  Filed Vitals:    01/20/16 1438 01/20/16 1440  BP: 160/76 160/72  Pulse: 95   Temp: 98 F (36.7 C)   Resp: 16   Height: '5\' 4"'$  (1.626 m)   Weight: 160 lb (72.576 kg)   SpO2: 97%     Body mass index is 27.45 kg/(m^2).           Review of Systems Patient has history of coronary artery disease , diabetes mellitus, hypertension, hyperlipidemia, COPD.     Objective:   Physical Exam BP 160/72 mmHg  Pulse 95  Temp(Src) 98 F (36.7 C)  Resp 16  Ht '5\' 4"'$  (1.626 m)  Wt 160 lb (72.576 kg)  BMI 27.45 kg/m2  SpO2 97%  Gen.-alert and oriented x3 in no apparent distress -chronically ill -moves slowly for age HEENT normal for age Lungs no rhonchi or wheezing Cardiovascular regular rhythm no murmurs carotid pulses 3+ palpable no bruits audible Abdomen soft nontender no palpable masses Musculoskeletal free of  major deformities Skin clear -no rashes Neurologic normal Lower extremities 3+ femoral and dorsalis pedis pulses palpable bilaterally with no edema on the left 1+ edema on the right Ulcer lateral aspect right lower leg measuring proximally 4 x 2 cm. There is hyperpigmentation lower third right leg. Brisk arterial flow in posterior tibial artery right ankle and monophasic flow dorsalis pedis artery.   Today I ordered a venous duplex exam of both legs are reviewed and interpreted. There is gross reflux in bilateral great saphenous veins with no DVT.   I performed an independent sono  site exam of the right leg and she does appear to be a candidate for laser ablation because of gross reflux in the right great saphenous vein and large caliber vein.       Assessment:      slowly healing right lower leg venous stasis ulcer which has been present for several months being treated to wound center  Gross reflux right great saphenous vein supplying bulging veins in the right leg and causing venous hypertension  Patient unable to wear long leg elastic compression stockings because of size of legs  -currently going to  Wound center for treatment  History coronary artery disease History of diabetes mellitus History of COPD     Plan:      I have recommended laser ablation of the right great saphenous vein from the knee to the saphenofemoral junction to help facilitate healing of the wound and hopefully help prevent further stasis ulcers Her daughter ask several questions which I answered She is not ready to make a decision  at this time She may consult further with the  Dr. At the wound center We have offered her this procedure if she gets back in touch with [place] we will schedule it. I  did not promise the patient that this would heal the wound but did suggest that it should help with healing and help with prevention of further ulcers

## 2016-01-20 NOTE — Progress Notes (Signed)
Filed Vitals:   01/20/16 1438 01/20/16 1440  BP: 160/76 160/72  Pulse: 95   Temp: 98 F (36.7 C)   Resp: 16   Height: '5\' 4"'$  (1.626 m)   Weight: 160 lb (72.576 kg)   SpO2: 97%

## 2016-01-23 ENCOUNTER — Encounter: Payer: Medicare Other | Attending: Physical Medicine & Rehabilitation

## 2016-01-23 ENCOUNTER — Ambulatory Visit (HOSPITAL_BASED_OUTPATIENT_CLINIC_OR_DEPARTMENT_OTHER): Payer: Medicare Other | Admitting: Physical Medicine & Rehabilitation

## 2016-01-23 ENCOUNTER — Encounter: Payer: Self-pay | Admitting: Physical Medicine & Rehabilitation

## 2016-01-23 VITALS — BP 158/74 | HR 66 | Resp 14

## 2016-01-23 DIAGNOSIS — M1611 Unilateral primary osteoarthritis, right hip: Secondary | ICD-10-CM | POA: Diagnosis not present

## 2016-01-23 DIAGNOSIS — M19042 Primary osteoarthritis, left hand: Secondary | ICD-10-CM

## 2016-01-23 DIAGNOSIS — Z9889 Other specified postprocedural states: Secondary | ICD-10-CM | POA: Insufficient documentation

## 2016-01-23 DIAGNOSIS — Z79899 Other long term (current) drug therapy: Secondary | ICD-10-CM | POA: Diagnosis not present

## 2016-01-23 DIAGNOSIS — M19041 Primary osteoarthritis, right hand: Secondary | ICD-10-CM | POA: Diagnosis not present

## 2016-01-23 DIAGNOSIS — K219 Gastro-esophageal reflux disease without esophagitis: Secondary | ICD-10-CM | POA: Diagnosis not present

## 2016-01-23 DIAGNOSIS — E785 Hyperlipidemia, unspecified: Secondary | ICD-10-CM | POA: Insufficient documentation

## 2016-01-23 DIAGNOSIS — I10 Essential (primary) hypertension: Secondary | ICD-10-CM | POA: Diagnosis not present

## 2016-01-23 NOTE — Progress Notes (Signed)
Subjective:    Patient ID: [name], female    DOB: 01/20/1932, 82 y.o.   MRN: [id number]  HPI 82 year old female with history of end-stage osteoarthritis of the right hip. She is elected no surgical interventions, has been seen by orthopedic surgeon Dr. Paralee Cancel.  Patient has continued right hip pain she takes tramadol on an occasional basis. She also complains of bilateral hand pain she points to her wrist as well as her hands. There is been no recent trauma to that area.  Her husband has been treated for carpal tunnel syndrome and Has had good relief at night wearing wrist splints.   Patient does not have a history of carpal tunnel, no history of wrist or hand surgeries. Patient is a diabetic. Pain Inventory Average Pain 7 Pain Right Now 7 My pain is NA  In the last 24 hours, has pain interfered with the following? General activity 2 Relation with others 2 Enjoyment of life 3 What TIME of day is your pain at its worst? morning Sleep (in general) Fair  Pain is worse with: walking Pain improves with: medication Relief from Meds: 7  Mobility use a walker how many minutes can you walk? 25 ability to climb steps?  no do you drive?  no needs help with transfers  Function Do you have any goals in this area?  no  Neuro/Psych No problems in this area  Prior Studies Any changes since last visit?  no  Physicians involved in your care Any changes since last visit?  no Primary care . Orthopedist .   Family History  Problem Relation Age of Onset  . Hypertension Mother   . Hypertension Father   . Heart attack Neg Hx   . Stroke Neg Hx    Social History   Social History  . Marital Status: Married    Spouse Name: N/A  . Number of Children: N/A  . Years of Education: N/A   Social History Main Topics  . Smoking status: Never Smoker   . Smokeless tobacco: Never Used  . Alcohol Use: No  . Drug Use: No  . Sexual Activity: Not Currently   Other Topics  Concern  . None   Social History Narrative   Past Surgical History  Procedure Laterality Date  . Pars plana vitrectomy      right eye  . Posterior capsulectomy      right eye  . Pars plana vitrectomy w/ endophotocoagulation      right eye  . Fiberoptic bronch with endobronchial u/s  07/02/2010  . Video bronchoscopy  12/09/2009  . 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 Bronch with endobronchial biopies  09/15/2009  . Colonoscopy N/A 09/13/2013    Procedure: COLONOSCOPY;  Surgeon: Jeryl Columbia, MD;  Location: [facility];  Service: Endoscopy;  Laterality: N/A;   Past Medical History  Diagnosis Date  . Diabetes mellitus   . Hypertension   . Coronary artery disease   . Aortic insufficiency   . Hyperlipidemia   . Aortic aneurysm ([place])   . Hypercholesterolemia   . COPD (chronic obstructive pulmonary disease) ([place])   . Bronchitis   . Edema   . GERD (gastroesophageal reflux disease)   . Aortic valve disease     stable  . Pain in joint, forearm   . Pain in joint, hand   . Pain in joint, lower leg   . Difficulty in walking(719.7)   . Disturbance  of skin sensation   . Cancer ([facility])    BP 158/74 mmHg  Pulse 66  Resp 14  SpO2 98%  Opioid Risk Score:   Fall Risk Score:  `1  Depression screen PHQ 2/9  Depression screen [facility] 2/9 01/23/2016 01/07/2015  Decreased Interest 3 0  Down, Depressed, Hopeless 0 0  PHQ - 2 Score 3 0  Altered sleeping 3 3  Tired, decreased energy 1 2  Change in appetite 0 0  Feeling bad or failure about yourself  0 3  Trouble concentrating 0 0  Moving slowly or fidgety/restless 0 3  Suicidal thoughts 0 0  PHQ-9 Score 7 11  Difficult doing work/chores Not difficult at all -     Review of Systems  Respiratory: Positive for cough.   Endocrine:       High blood sugar   All other systems reviewed and are negative.      Objective:   Physical Exam  Constitutional: She is oriented to person, place,  and time. She appears well-developed and well-nourished.  HENT:  Head: Normocephalic and atraumatic.  Eyes: Conjunctivae and EOM are normal. Pupils are equal, round, and reactive to light.  Neurological: She is alert and oriented to person, place, and time.  Psychiatric: She has a normal mood and affect.  Nursing note and vitals reviewed.   Mild osteoarthritic changes in the PIP DIP area. She has no evidence of crepitus or effusion in the wrists or hands. No erythema. No pain to palpation in the wrist or hand joints. No pain with gripping. There is no evidence of APB or hand intrinsic atrophy bilaterally. Due to her poor English, difficult to get a sensory exam of the hands.  Right hip has pain with internal and external rotation. No pain with knee range of motion. Mild tenderness over the right greater trochanter.      Assessment & Plan:  1. Right hip osteoarthritis end-stage findings, intra-articular injection was tried under fluoroscopic guidance but was not helpful. She will follow up with orthopedics on this. We could continue tramadol 50 mg 4 times a day when necessary  2. Bilateral hand pain. Appears to be related to mild loss arthritis. Will give samples of Flector patch on 12 of 12 and this is helpful with the right prescription. I really don't think she needs braces. She does not have any significant joint deformities. No pain with joint range of motion. If her symptoms progress or she starts complaining more of tingling or numbness may consider EMG/NCV  Physical medicine rehabilitation follow-up in 6 months

## 2016-01-23 NOTE — Patient Instructions (Signed)
You may see Dr. Alvan Dame for the hip Pain  Please call your pharmacy if you need a refill on the tramadol

## 2016-01-25 ENCOUNTER — Other Ambulatory Visit: Payer: Self-pay | Admitting: Cardiovascular Disease

## 2016-01-29 ENCOUNTER — Emergency Department (HOSPITAL_COMMUNITY): Payer: Medicare Other

## 2016-01-29 ENCOUNTER — Encounter (HOSPITAL_COMMUNITY): Payer: Self-pay | Admitting: Emergency Medicine

## 2016-01-29 ENCOUNTER — Emergency Department (HOSPITAL_COMMUNITY)
Admission: EM | Admit: 2016-01-29 | Discharge: 2016-01-29 | Disposition: A | Discharge status: Home / Self Care | Payer: Medicare Other | Attending: Emergency Medicine | Admitting: Emergency Medicine

## 2016-01-29 DIAGNOSIS — I2510 Atherosclerotic heart disease of native coronary artery without angina pectoris: Secondary | ICD-10-CM | POA: Insufficient documentation

## 2016-01-29 DIAGNOSIS — I10 Essential (primary) hypertension: Secondary | ICD-10-CM | POA: Diagnosis not present

## 2016-01-29 DIAGNOSIS — Z7951 Long term (current) use of inhaled steroids: Secondary | ICD-10-CM | POA: Diagnosis not present

## 2016-01-29 DIAGNOSIS — E119 Type 2 diabetes mellitus without complications: Secondary | ICD-10-CM | POA: Diagnosis not present

## 2016-01-29 DIAGNOSIS — K219 Gastro-esophageal reflux disease without esophagitis: Secondary | ICD-10-CM | POA: Insufficient documentation

## 2016-01-29 DIAGNOSIS — J449 Chronic obstructive pulmonary disease, unspecified: Secondary | ICD-10-CM | POA: Insufficient documentation

## 2016-01-29 DIAGNOSIS — E7800 Pure hypercholesterolemia, unspecified: Secondary | ICD-10-CM | POA: Insufficient documentation

## 2016-01-29 DIAGNOSIS — Z7982 Long term (current) use of aspirin: Secondary | ICD-10-CM | POA: Diagnosis not present

## 2016-01-29 DIAGNOSIS — E785 Hyperlipidemia, unspecified: Secondary | ICD-10-CM | POA: Diagnosis not present

## 2016-01-29 DIAGNOSIS — Z79899 Other long term (current) drug therapy: Secondary | ICD-10-CM | POA: Diagnosis not present

## 2016-01-29 DIAGNOSIS — R109 Unspecified abdominal pain: Secondary | ICD-10-CM | POA: Insufficient documentation

## 2016-01-29 DIAGNOSIS — Z859 Personal history of malignant neoplasm, unspecified: Secondary | ICD-10-CM | POA: Diagnosis not present

## 2016-01-29 DIAGNOSIS — Z7984 Long term (current) use of oral hypoglycemic drugs: Secondary | ICD-10-CM | POA: Diagnosis not present

## 2016-01-29 DIAGNOSIS — M545 Low back pain: Secondary | ICD-10-CM | POA: Insufficient documentation

## 2016-01-29 DIAGNOSIS — R3915 Urgency of urination: Secondary | ICD-10-CM | POA: Insufficient documentation

## 2016-01-29 DIAGNOSIS — Z791 Long term (current) use of non-steroidal anti-inflammatories (NSAID): Secondary | ICD-10-CM | POA: Insufficient documentation

## 2016-01-29 DIAGNOSIS — R350 Frequency of micturition: Secondary | ICD-10-CM | POA: Diagnosis not present

## 2016-01-29 LAB — URINALYSIS, ROUTINE W REFLEX MICROSCOPIC
Bilirubin Urine: NEGATIVE
Glucose, UA: NEGATIVE mg/dL
Ketones, ur: NEGATIVE mg/dL
Nitrite: NEGATIVE
Protein, ur: NEGATIVE mg/dL
Specific Gravity, Urine: 1.017 (ref 1.005–1.030)
pH: 5.5 (ref 5.0–8.0)

## 2016-01-29 LAB — URINE MICROSCOPIC-ADD ON

## 2016-01-29 MED ORDER — CEPHALEXIN 500 MG PO CAPS: 500.0000 mg | ORAL_CAPSULE | Freq: Three times a day (TID) | ORAL | Status: DC

## 2016-01-29 MED ORDER — OXYCODONE-ACETAMINOPHEN 5-325 MG PO TABS
1.0000 | ORAL_TABLET | ORAL | Status: DC | PRN
  Administered 2016-01-29: 1 via ORAL
  Filled 2016-01-29: qty 1

## 2016-01-29 MED ORDER — CEPHALEXIN 250 MG PO CAPS
500.0000 mg | ORAL_CAPSULE | Freq: Once | ORAL | Status: AC
  Administered 2016-01-29: 500 mg via ORAL
  Filled 2016-01-29: qty 2

## 2016-01-29 MED ORDER — PHENAZOPYRIDINE HCL 200 MG PO TABS: 200.0000 mg | ORAL_TABLET | Freq: Three times a day (TID) | ORAL | Status: DC | PRN

## 2016-01-29 NOTE — ED Notes (Signed)
[facility] to help facilitate set up for appointment as patient requests. [facility] obtains patient contact information to schedule an appointment tomorrow.

## 2016-01-29 NOTE — Discharge Instructions (Signed)
It was our pleasure to provide your ER care today - we hope that you feel better.  Rest. Drink plenty of fluids.  Take keflex (antibiotic) for suspected urine infection.  A urine culture was sent the results of which will be back in 2-3 days - have your doctor follow up on those results then.  You may try taking pyridium as need for bladder spasm.  Follow up with urologist in the next 1-2 weeks - see referral - call office to arrange appointment.  Follow up with primary care doctor for recheck of blood pressure in the next few weeks, as it is high today.   Return to ER if worse, new symptoms, fevers, vomiting, severe pain, other concern.       Urinary Frequency The number of times a normal person urinates depends upon how much liquid they take in and how much liquid they are losing. If the temperature is hot and there is high humidity, then the person will sweat more and usually breathe a little more frequently. These factors decrease the amount of frequency of urination that would be considered normal. The amount you drink is easily determined, but the amount of fluid lost is sometimes more difficult to calculate.  Fluid is lost in two ways:  Sensible fluid loss is usually measured by the amount of urine that you get rid of. Losses of fluid can also occur with diarrhea.  Insensible fluid loss is more difficult to measure. It is caused by evaporation. Insensible loss of fluid occurs through breathing and sweating. It usually ranges from a little less than a quart to a little more than a quart of fluid a day. In normal temperatures and activity levels, the average person may urinate 4 to 7 times in a 24-hour period. Needing to urinate more often than that could indicate a problem. If one urinates 4 to 7 times in 24 hours and has large volumes each time, that could indicate a different problem from one who urinates 4 to 7 times a day and has small volumes. The time of urinating is also  important. Most urinating should be done during the waking hours. Getting up at night to urinate frequently can indicate some problems. CAUSES  The bladder is the organ in your lower abdomen that holds urine. Like a balloon, it swells some as it fills up. Your nerves sense this and tell you it is time to head for the bathroom. There are a number of reasons that you might feel the need to urinate more often than usual. They include:  Urinary tract infection. This is usually associated with other signs such as burning when you urinate.  In men, problems with the prostate (a walnut-size gland that is located near the tube that carries urine out of your body). There are two reasons why the prostate can cause an increased frequency of urination:  An enlarged prostate that does not let the bladder empty well. If the bladder only half empties when you urinate, then it only has half the capacity to fill before you have to urinate again.  The nerves in the bladder become more hypersensitive with an increased size of the prostate even if the bladder empties completely.  Pregnancy.  Obesity. Excess weight is more likely to cause a problem for women than for men.  Bladder stones or other bladder problems.  Caffeine.  Alcohol.  Medications. For example, drugs that help the body get rid of extra fluid (diuretics) increase urine production. Some  other medicines must be taken with lots of fluids.  Muscle or nerve weakness. This might be the result of a spinal cord injury, a stroke, multiple sclerosis, or Parkinson disease.  Long-standing diabetes can decrease the sensation of the bladder. This loss of sensation makes it harder to sense the bladder needs to be emptied. Over a period of years, the bladder is stretched out by constant overfilling. This weakens the bladder muscles so that the bladder does not empty well and has less capacity to fill with new urine.  Interstitial cystitis (also called painful  bladder syndrome). This condition develops because the tissues that line the inside of the bladder are inflamed (inflammation is the body's way of reacting to injury or infection). It causes pain and frequent urination. It occurs in women more often than in men. DIAGNOSIS   To decide what might be causing your urinary frequency, your health care provider will probably:  Ask about symptoms you have noticed.  Ask about your overall health. This will include questions about any medications you are taking.  Do a physical examination.  Order some tests. These might include:  A blood test to check for diabetes or other health issues that could be contributing to the problem.  Urine testing. This could measure the flow of urine and the pressure on the bladder.  A test of your neurological system (the brain, spinal cord, and nerves). This is the system that senses the need to urinate.  A bladder test to check whether it is emptying completely when you urinate.  Cystoscopy. This test uses a thin tube with a tiny camera on it. It offers a look inside your urethra and bladder to see if there are problems.  Imaging tests. You might be given a contrast dye and then asked to urinate. X-rays are taken to see how your bladder is working. TREATMENT  It is important for you to be evaluated to determine if the amount or frequency that you have is unusual or abnormal. If it is found to be abnormal, the cause should be determined and this can usually be found out easily. Depending upon the cause, treatment could include medication, stimulation of the nerves, or surgery. There are not too many things that you can do as an individual to change your urinary frequency. It is important that you balance the amount of fluid intake needed to compensate for your activity and the temperature. Medical problems will be diagnosed and taken care of by your physician. There is no particular bladder training such as Kegel  exercises that you can do to help urinary frequency. This is an exercise that is usually recommended for people who have leaking of urine when they laugh, cough, or sneeze. HOME CARE INSTRUCTIONS   Take any medications your health care provider prescribed or suggested. Follow the directions carefully.  Practice any lifestyle changes that are recommended. These might include:  Drinking less fluid or drinking at different times of the day. If you need to urinate often during the night, for example, you may need to stop drinking fluids early in the evening.  Cutting down on caffeine or alcohol. They both can make you need to urinate more often than normal. Caffeine is found in coffee, tea, and sodas.  Losing weight, if that is recommended.  Keep a journal or a log. You might be asked to record how much you drink and when and where you feel the need to urinate. This will also help evaluate how well  the treatment provided by your physician is working. SEEK MEDICAL CARE IF:   Your need to urinate often gets worse.  You feel increased pain or irritation when you urinate.  You notice blood in your urine.  You have questions about any medications that your health care provider recommended.  You notice blood, pus, or swelling at the site of any test or treatment procedure.  You develop a fever of more than 100.64F (38.1C). SEEK IMMEDIATE MEDICAL CARE IF:  You develop a fever of more than 102.83F (38.9C).   This information is not intended to replace advice given to you by your health care provider. Make sure you discuss any questions you have with your health care provider.   Document Released: 08/07/2009 Document Revised: 11/01/2014 Document Reviewed: 08/07/2009 Elsevier Interactive Patient Education 2016 [employer]    Urinary Tract Infection A urinary tract infection (UTI) can occur any place along the urinary tract. The tract includes the kidneys, ureters, bladder, and urethra. A  type of germ called bacteria often causes a UTI. UTIs are often helped with antibiotic medicine.  HOME CARE   If given, take antibiotics as told by your doctor. Finish them even if you start to feel better.  Drink enough fluids to keep your pee (urine) clear or pale yellow.  Avoid tea, drinks with caffeine, and bubbly (carbonated) drinks.  Pee often. Avoid holding your pee in for a long time.  Pee before and after having sex (intercourse).  Wipe from front to back after you poop (bowel movement) if you are a woman. Use each tissue only once. GET HELP RIGHT AWAY IF:   You have back pain.  You have lower belly (abdominal) pain.  You have chills.  You feel sick to your stomach (nauseous).  You throw up (vomit).  Your burning or discomfort with peeing does not go away.  You have a fever.  Your symptoms are not better in 3 days. MAKE SURE YOU:   Understand these instructions.  Will watch your condition.  Will get help right away if you are not doing well or get worse.   This information is not intended to replace advice given to you by your health care provider. Make sure you discuss any questions you have with your health care provider.   Document Released: 03/29/2008 Document Revised: 11/01/2014 Document Reviewed: 05/11/2012 Elsevier Interactive Patient Education [employer].

## 2016-01-29 NOTE — ED Notes (Signed)
Pt presents with frequent urination. No other symptoms reported. Pt alert x4. NAD at this time.

## 2016-01-29 NOTE — ED Provider Notes (Signed)
CSN: [id number]     Arrival date & time 01/29/16  1315 History   First MD Initiated Contact with Patient 01/29/16 1820     Chief Complaint  Patient presents with  . Polyuria     (Consider location/radiation/quality/duration/timing/severity/associated sxs/prior Treatment) The history is provided by the patient and a relative. A language interpreter was used.  Patient w hx htn, dm, c/o urinary urgency/frequency for the past several months, worse this week. Symptoms moderate, persistent. States at times urinates large amount, and at other times small amounts. No hematuria. No dysuria. No fever or chills. Patient at times ha noticed right low back/flank pain. No leg pain, no numbness/weakness.        Past Medical History  Diagnosis Date  . Diabetes mellitus   . Hypertension   . Coronary artery disease   . Aortic insufficiency   . Hyperlipidemia   . Aortic aneurysm ([place])   . Hypercholesterolemia   . COPD (chronic obstructive pulmonary disease) ([place])   . Bronchitis   . Edema   . GERD (gastroesophageal reflux disease)   . Aortic valve disease     stable  . Pain in joint, forearm   . Pain in joint, hand   . Pain in joint, lower leg   . Difficulty in walking(719.7)   . Disturbance of skin sensation   . Cancer [facility])    Past Surgical History  Procedure Laterality Date  . Pars plana vitrectomy      right eye  . Posterior capsulectomy      right eye  . Pars plana vitrectomy w/ endophotocoagulation      right eye  . Fiberoptic bronch with endobronchial u/s  07/02/2010  . Video bronchoscopy  12/09/2009  . 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 Bronch with endobronchial biopies  09/15/2009  . Colonoscopy N/A 09/13/2013    Procedure: COLONOSCOPY;  Surgeon: Jeryl Columbia, MD;  Location: [facility];  Service: Endoscopy;  Laterality: N/A;   Family History  Problem Relation Age of Onset  . Hypertension Mother   . Hypertension Father   .  Heart attack Neg Hx   . Stroke Neg Hx    Social History  Substance Use Topics  . Smoking status: Never Smoker   . Smokeless tobacco: Never Used  . Alcohol Use: No   OB History    Gravida Para Term Preterm AB TAB SAB Ectopic Multiple Living   '2 1 1  1  1   1     '$ Review of Systems  Constitutional: Negative for fever.  HENT: Negative for sore throat.   Eyes: Negative for redness.  Respiratory: Negative for shortness of breath.   Cardiovascular: Negative for chest pain.  Gastrointestinal: Negative for vomiting and abdominal pain.  Genitourinary: Positive for urgency and frequency. Negative for dysuria, hematuria and pelvic pain.  Musculoskeletal: Positive for back pain.  Skin: Negative for rash.  Neurological: Negative for headaches.  Hematological: Does not bruise/bleed easily.  Psychiatric/Behavioral: Negative for confusion.      Allergies  Review of patient's allergies indicates no known allergies.  Home Medications   Prior to Admission medications   Medication Sig Start Date End Date Taking? Authorizing Provider  albuterol (PROVENTIL HFA;VENTOLIN HFA) 108 (90 Base) MCG/ACT inhaler Inhale 2 puffs into the lungs every 6 (six) hours as needed for wheezing or shortness of breath.    Historical Provider, MD  amLODipine (NORVASC) 10 MG tablet Take 10 mg by mouth  daily. 10/29/15   Historical Provider, MD  amLODipine (NORVASC) 10 MG tablet TAKE 1 TABLET (10 MG TOTAL) BY MOUTH DAILY. 01/27/16   Josue Hector, MD  aspirin EC 81 MG tablet Take 81 mg by mouth every evening. 7 pm    Historical Provider, MD  atorvastatin (LIPITOR) 10 MG tablet Take 10 mg by mouth at bedtime. 06/17/15   Historical Provider, MD  BENICAR 40 MG tablet TAKE 1 TABLET (40 MG TOTAL) BY MOUTH 2 (TWO) TIMES DAILY. 10/21/15   Josue Hector, MD  diclofenac sodium (VOLTAREN) 1 % GEL APPLY 2 GRAMS TOPICALLY 4 (FOUR) TIMES DAILY. 09/23/15   Meredith Staggers, MD  FeFum-FePoly-FA-B Cmp-C-Biot (INTEGRA PLUS) CAPS TAKE ONE  CAPSULE BY MOUTH EVERY DAY *NOT COVERED* 12/22/15   Curt Bears, MD  fluticasone (FLOVENT HFA) 220 MCG/ACT inhaler Inhale 1 puff into the lungs 2 (two) times daily. Reported on 01/20/2016    Historical Provider, MD  furosemide (LASIX) 20 MG tablet Take 1 tablet (20 mg total) by mouth daily as needed for fluid or edema. 08/29/15   Josue Hector, MD  glimepiride (AMARYL) 1 MG tablet Take 1 mg by mouth daily.     Historical Provider, MD  montelukast (SINGULAIR) 10 MG tablet Take 10 mg by mouth at bedtime. 06/04/15   Historical Provider, MD  Multiple Vitamin (MULITIVITAMIN WITH MINERALS) TABS Take 1 tablet by mouth daily.    Historical Provider, MD  nitroGLYCERIN (NITROSTAT) 0.4 MG SL tablet Place 0.4 mg under the tongue every 5 (five) minutes as needed for chest pain (3 DOSES MAX).    Historical Provider, MD  Omega-3 Fatty Acids (FISH OIL) 1200 MG CAPS Take 1 capsule by mouth daily.    Historical Provider, MD  omeprazole (PRILOSEC) 20 MG capsule Take 20 mg by mouth daily before breakfast. 06/04/15   Historical Provider, MD  pioglitazone (ACTOS) 15 MG tablet Take 7.5 mg by mouth daily.     Historical Provider, MD  potassium chloride (K-DUR,KLOR-CON) 10 MEQ tablet Take 20 mEq by mouth daily.    Historical Provider, MD  Probiotic Product (PROBIOTIC PO) Take 1 tablet by mouth daily.    Historical Provider, MD  Spacer/Aero-Holding Chambers (AEROCHAMBER MINI CHAMBER) DEVI use as directed with your inhalers  04/27/11   Historical Provider, MD   BP 141/52 mmHg  Pulse 72  Temp(Src) 98.3 F (36.8 C) (Oral)  Resp 18  SpO2 96% Physical Exam  Constitutional: She appears well-developed and well-nourished. No distress.  HENT:  Mouth/Throat: Oropharynx is clear and moist.  Eyes: Conjunctivae are normal. No scleral icterus.  Neck: Neck supple. No tracheal deviation present.  Cardiovascular: Normal rate, regular rhythm, normal heart sounds and intact distal pulses.   Pulmonary/Chest: Effort normal and breath  sounds normal. No respiratory distress.  Abdominal: Soft. Normal appearance and bowel sounds are normal. She exhibits no distension and no mass. There is no tenderness. There is no rebound and no guarding.  Genitourinary:  No cva tenderness  Musculoskeletal: She exhibits no edema.  TLS spine non tender, aligned.   Neurological: She is alert.  Steady gait.   Skin: Skin is warm and dry. No rash noted. She is not diaphoretic.  Psychiatric: She has a normal mood and affect.  Nursing note and vitals reviewed.   ED Course  Procedures (including critical care time) Labs Review  Results for orders placed or performed during the hospital encounter of 01/29/16  Urinalysis, Routine w reflex microscopic-may I&O cath if menses (not at  [facility])  Result Value Ref Range   Color, Urine YELLOW YELLOW   APPearance CLEAR CLEAR   Specific Gravity, Urine 1.017 1.005 - 1.030   pH 5.5 5.0 - 8.0   Glucose, UA NEGATIVE NEGATIVE mg/dL   Hgb urine dipstick SMALL (A) NEGATIVE   Bilirubin Urine NEGATIVE NEGATIVE   Ketones, ur NEGATIVE NEGATIVE mg/dL   Protein, ur NEGATIVE NEGATIVE mg/dL   Nitrite NEGATIVE NEGATIVE   Leukocytes, UA MODERATE (A) NEGATIVE  Urine microscopic-add on  Result Value Ref Range   Squamous Epithelial / LPF 6-30 (A) NONE SEEN   WBC, UA 6-30 0 - 5 WBC/hpf   RBC / HPF 0-5 0 - 5 RBC/hpf   Bacteria, UA FEW (A) NONE SEEN   US Renal  01/29/2016  CLINICAL DATA:  Back and flank pain for 1 week. EXAM: RENAL / URINARY TRACT ULTRASOUND COMPLETE COMPARISON:  None. FINDINGS: Right Kidney: Length: 10.9 cm. Echogenicity within normal limits. No mass or hydronephrosis visualized. Left Kidney: Length: 11.1 cm. Echogenicity within normal limits. No mass or hydronephrosis visualized. Bladder: Appears normal for degree of bladder distention. IMPRESSION: Negative for hydronephrosis.  No focal renal lesions. Electronically Signed   By: Andreas Newport M.D.   On: 01/29/2016 19:48      I have personally  reviewed and evaluated these lab results as part of my medical decision-making.    MDM   Labs.  Reviewed nursing notes and prior charts for additional history. Marland Kitchen  U/A with moderate LE, 6-30 wbc, few bacteria - will cx and rx.  Family requests referral to urologist - will provide.  Abd soft, non tender, no vomiting, afeb.  Pt currently appears stable for d/c.   Had labs checked last month for same, renal fxn fine then.  Renal u/s today neg.  Keflex po.   Pt currently appears stable for d/c.  Return precautions provided.      Lajean Saver, MD 01/29/16 2005

## 2016-02-01 LAB — URINE CULTURE

## 2016-02-02 ENCOUNTER — Telehealth (HOSPITAL_BASED_OUTPATIENT_CLINIC_OR_DEPARTMENT_OTHER): Payer: Self-pay | Admitting: Emergency Medicine

## 2016-02-02 DIAGNOSIS — E119 Type 2 diabetes mellitus without complications: Secondary | ICD-10-CM | POA: Diagnosis not present

## 2016-02-02 DIAGNOSIS — T148 Other injury of unspecified body region: Secondary | ICD-10-CM | POA: Diagnosis not present

## 2016-02-02 DIAGNOSIS — I878 Other specified disorders of veins: Secondary | ICD-10-CM | POA: Diagnosis not present

## 2016-02-02 DIAGNOSIS — S81801D Unspecified open wound, right lower leg, subsequent encounter: Secondary | ICD-10-CM | POA: Diagnosis not present

## 2016-02-02 DIAGNOSIS — R600 Localized edema: Secondary | ICD-10-CM | POA: Diagnosis not present

## 2016-02-02 DIAGNOSIS — S81801S Unspecified open wound, right lower leg, sequela: Secondary | ICD-10-CM | POA: Diagnosis not present

## 2016-02-02 DIAGNOSIS — I83018 Varicose veins of right lower extremity with ulcer other part of lower leg: Secondary | ICD-10-CM | POA: Diagnosis not present

## 2016-02-02 DIAGNOSIS — L97812 Non-pressure chronic ulcer of other part of right lower leg with fat layer exposed: Secondary | ICD-10-CM | POA: Diagnosis not present

## 2016-02-02 DIAGNOSIS — L98492 Non-pressure chronic ulcer of skin of other sites with fat layer exposed: Secondary | ICD-10-CM | POA: Diagnosis not present

## 2016-02-02 NOTE — Telephone Encounter (Signed)
Post ED Visit - Positive Culture Follow-up: Successful Patient Follow-Up  Culture assessed and recommendations reviewed by: '[]'$  Elenor Quinones, Pharm.D. '[]'$  Heide Guile, Pharm.D., BCPS '[]'$  Parks Neptune, Pharm.D. '[]'$  Alycia Rossetti, Pharm.D., BCPS '[]'$  [place], [place].D., BCPS, AAHIVP '[]'$  Legrand Como, Pharm.D., BCPS, AAHIVP '[]'$  Milus Glazier, Pharm.D. '[]'$  Stephens November, Pharm.Herbie Baltimore PharmD  Positive urine culture Enterococcus  '[]'$  Patient discharged without antimicrobial prescription and treatment is now indicated '[x]'$  Organism is resistant to prescribed ED discharge antimicrobial '[]'$  Patient with positive blood cultures  Changes discussed with ED provider: Wonda Cerise  PA New antibiotic prescription Start Nitrofurantoin '100mg'$  po bid x 5 days  Attempting to contact patient   Hazle Nordmann 02/02/2016, 10:22 AM

## 2016-02-02 NOTE — Progress Notes (Signed)
ED Antimicrobial Stewardship Positive Culture Follow Up   [name] is an 82 y.o. female who presented to [facility] on 01/29/2016 with a chief complaint of  Chief Complaint  Patient presents with  . Polyuria    Recent Results (from the past 720 hour(s))  Urine culture     Status: None   Collection Time: 01/03/16  1:29 PM  Result Value Ref Range Status   Specimen Description URINE, RANDOM  Final   Special Requests CX ADDEDA T [id number]  Final   Culture MULTIPLE SPECIES PRESENT, SUGGEST RECOLLECTION  Final   Report Status 01/05/2016 FINAL  Final  Urine culture     Status: Abnormal   Collection Time: 01/29/16  6:19 PM  Result Value Ref Range Status   Specimen Description URINE, CLEAN CATCH  Final   Special Requests NONE  Final   Culture >=100,000 COLONIES/mL ENTEROCOCCUS SPECIES (A)  Final   Report Status 02/01/2016 FINAL  Final   Organism ID, Bacteria ENTEROCOCCUS SPECIES (A)  Final      Susceptibility   Enterococcus species - MIC*    AMPICILLIN <=2 SENSITIVE Sensitive     LEVOFLOXACIN >=8 RESISTANT Resistant     NITROFURANTOIN <=16 SENSITIVE Sensitive     VANCOMYCIN 1 SENSITIVE Sensitive     * >=100,000 COLONIES/mL ENTEROCOCCUS SPECIES    '[x]'$  Treated with cephalexin, organism resistant to prescribed antimicrobial  New antibiotic prescription:  Nitrofurantoin '100mg'$  BID x5 days   ED Provider: Donnald Garre, PA-C  Dexton Zwilling C. Lennox Grumbles, PharmD Pharmacy Resident  Pager: [phone] 02/02/2016 8:52 AM

## 2016-02-03 DIAGNOSIS — Z0000 Encounter for general adult medical examination without abnormal findings: Secondary | ICD-10-CM | POA: Diagnosis not present

## 2016-02-03 DIAGNOSIS — N312 Flaccid neuropathic bladder, not elsewhere classified: Secondary | ICD-10-CM | POA: Diagnosis not present

## 2016-02-13 ENCOUNTER — Telehealth: Payer: Self-pay

## 2016-02-16 DIAGNOSIS — T148 Other injury of unspecified body region: Secondary | ICD-10-CM | POA: Diagnosis not present

## 2016-02-16 DIAGNOSIS — S81801S Unspecified open wound, right lower leg, sequela: Secondary | ICD-10-CM | POA: Diagnosis not present

## 2016-02-16 DIAGNOSIS — L97812 Non-pressure chronic ulcer of other part of right lower leg with fat layer exposed: Secondary | ICD-10-CM | POA: Diagnosis not present

## 2016-02-16 DIAGNOSIS — I878 Other specified disorders of veins: Secondary | ICD-10-CM | POA: Diagnosis not present

## 2016-02-16 DIAGNOSIS — L98492 Non-pressure chronic ulcer of skin of other sites with fat layer exposed: Secondary | ICD-10-CM | POA: Diagnosis not present

## 2016-02-16 DIAGNOSIS — E119 Type 2 diabetes mellitus without complications: Secondary | ICD-10-CM | POA: Diagnosis not present

## 2016-02-16 DIAGNOSIS — R600 Localized edema: Secondary | ICD-10-CM | POA: Diagnosis not present

## 2016-02-16 DIAGNOSIS — I83018 Varicose veins of right lower extremity with ulcer other part of lower leg: Secondary | ICD-10-CM | POA: Diagnosis not present

## 2016-02-16 DIAGNOSIS — S81801D Unspecified open wound, right lower leg, subsequent encounter: Secondary | ICD-10-CM | POA: Diagnosis not present

## 2016-02-17 ENCOUNTER — Other Ambulatory Visit: Payer: Self-pay | Admitting: Physical Medicine & Rehabilitation

## 2016-03-01 ENCOUNTER — Encounter (HOSPITAL_COMMUNITY): Payer: Self-pay | Admitting: *Deleted

## 2016-03-01 ENCOUNTER — Emergency Department (HOSPITAL_COMMUNITY)
Admission: EM | Admit: 2016-03-01 | Discharge: 2016-03-01 | Disposition: A | Discharge status: Home / Self Care | Payer: Medicare Other | Attending: Emergency Medicine | Admitting: Emergency Medicine

## 2016-03-01 ENCOUNTER — Emergency Department (HOSPITAL_COMMUNITY): Payer: Medicare Other

## 2016-03-01 DIAGNOSIS — E119 Type 2 diabetes mellitus without complications: Secondary | ICD-10-CM | POA: Insufficient documentation

## 2016-03-01 DIAGNOSIS — J449 Chronic obstructive pulmonary disease, unspecified: Secondary | ICD-10-CM | POA: Insufficient documentation

## 2016-03-01 DIAGNOSIS — M19042 Primary osteoarthritis, left hand: Secondary | ICD-10-CM | POA: Diagnosis not present

## 2016-03-01 DIAGNOSIS — K219 Gastro-esophageal reflux disease without esophagitis: Secondary | ICD-10-CM | POA: Insufficient documentation

## 2016-03-01 DIAGNOSIS — M25531 Pain in right wrist: Secondary | ICD-10-CM

## 2016-03-01 DIAGNOSIS — I2510 Atherosclerotic heart disease of native coronary artery without angina pectoris: Secondary | ICD-10-CM | POA: Insufficient documentation

## 2016-03-01 DIAGNOSIS — Z86018 Personal history of other benign neoplasm: Secondary | ICD-10-CM | POA: Diagnosis not present

## 2016-03-01 DIAGNOSIS — Z791 Long term (current) use of non-steroidal anti-inflammatories (NSAID): Secondary | ICD-10-CM | POA: Insufficient documentation

## 2016-03-01 DIAGNOSIS — E785 Hyperlipidemia, unspecified: Secondary | ICD-10-CM | POA: Diagnosis not present

## 2016-03-01 DIAGNOSIS — M19041 Primary osteoarthritis, right hand: Secondary | ICD-10-CM

## 2016-03-01 DIAGNOSIS — E7800 Pure hypercholesterolemia, unspecified: Secondary | ICD-10-CM | POA: Diagnosis not present

## 2016-03-01 DIAGNOSIS — Z7982 Long term (current) use of aspirin: Secondary | ICD-10-CM | POA: Insufficient documentation

## 2016-03-01 DIAGNOSIS — I10 Essential (primary) hypertension: Secondary | ICD-10-CM | POA: Diagnosis not present

## 2016-03-01 DIAGNOSIS — Z79899 Other long term (current) drug therapy: Secondary | ICD-10-CM | POA: Diagnosis not present

## 2016-03-01 MED ORDER — ACETAMINOPHEN-CODEINE #3 300-30 MG PO TABS: 1.0000 | ORAL_TABLET | Freq: Four times a day (QID) | ORAL | Status: DC | PRN

## 2016-03-01 NOTE — ED Provider Notes (Signed)
CSN: [id number]     Arrival date & time 03/01/16  1304 History  By signing my name below, I, Sonum Patel, attest that this documentation has been prepared under the direction and in the presence of Domenic Moras, PA-C. Electronically Signed: Sonum Patel, Education administrator. 03/01/2016. 3:23 PM.    Chief Complaint  Patient presents with  . Wrist Pain   The history is provided by the patient and a relative. No language interpreter was used.     HPI Comments: [name] is a 82 y.o. female who presents to the Emergency Department complaining of right wrist pain with associated paresthesias to the right finger tips that worsened 2 days ago while chopping food. She has taken Tramadol and wrapped the area with an ace wrap without significant relief. She describes her pain as heaviness. Patient has a history of arthritis and has trouble opening bottles and such. She states this is chronic for her and has been getting progressively worse. She denies fever, chest pain, SOB. No neck pain, shoulder, elbow pain.  No prior hx of PE/DVT.    Past Medical History  Diagnosis Date  . Diabetes mellitus   . Hypertension   . Coronary artery disease   . Aortic insufficiency   . Hyperlipidemia   . Aortic aneurysm ([place])   . Hypercholesterolemia   . COPD (chronic obstructive pulmonary disease) ([place])   . Bronchitis   . Edema   . GERD (gastroesophageal reflux disease)   . Aortic valve disease     stable  . Pain in joint, forearm   . Pain in joint, hand   . Pain in joint, lower leg   . Difficulty in walking(719.7)   . Disturbance of skin sensation   . Cancer [facility])    Past Surgical History  Procedure Laterality Date  . Pars plana vitrectomy      right eye  . Posterior capsulectomy      right eye  . Pars plana vitrectomy w/ endophotocoagulation      right eye  . Fiberoptic bronch with endobronchial u/s  07/02/2010  . Video bronchoscopy  12/09/2009  . Endobronchial excision of right upper lobe tumor with laser  bronchi  10/06/2009  . Bronch with endobronchial biopies  09/15/2009  . Colonoscopy N/A 09/13/2013    Procedure: COLONOSCOPY;  Surgeon: Jeryl Columbia, MD;  Location: [facility];  Service: Endoscopy;  Laterality: N/A;   Family History  Problem Relation Age of Onset  . Hypertension Mother   . Hypertension Father   . Heart attack Neg Hx   . Stroke Neg Hx    Social History  Substance Use Topics  . Smoking status: Never Smoker   . Smokeless tobacco: Never Used  . Alcohol Use: No   OB History    Gravida Para Term Preterm AB TAB SAB Ectopic Multiple Living   '2 1 1  1  1   1     '$ Review of Systems  Constitutional: Negative for fever.  Respiratory: Negative for shortness of breath.   Cardiovascular: Negative for chest pain.  Musculoskeletal: Positive for joint swelling and arthralgias.      Allergies  Review of patient's allergies indicates no known allergies.  Home Medications   Prior to Admission medications   Medication Sig Start Date End Date Taking? Authorizing Provider  albuterol (PROVENTIL HFA;VENTOLIN HFA) 108 (90 Base) MCG/ACT inhaler Inhale 2 puffs into the lungs every 6 (six) hours as needed for wheezing or shortness of breath.  Historical Provider, MD  amLODipine (NORVASC) 10 MG tablet Take 10 mg by mouth daily. 10/29/15   Historical Provider, MD  amLODipine (NORVASC) 10 MG tablet TAKE 1 TABLET (10 MG TOTAL) BY MOUTH DAILY. 01/27/16   Josue Hector, MD  aspirin EC 81 MG tablet Take 81 mg by mouth every evening. 7 pm    Historical Provider, MD  atorvastatin (LIPITOR) 10 MG tablet Take 10 mg by mouth at bedtime. 06/17/15   Historical Provider, MD  BENICAR 40 MG tablet TAKE 1 TABLET (40 MG TOTAL) BY MOUTH 2 (TWO) TIMES DAILY. 10/21/15   Josue Hector, MD  cephALEXin (KEFLEX) 500 MG capsule Take 1 capsule (500 mg total) by mouth 3 (three) times daily. 01/29/16   Lajean Saver, MD  diclofenac sodium (VOLTAREN) 1 % GEL APPLY 2 GRAMS TOPICALLY 4 (FOUR) TIMES DAILY. 09/23/15    Meredith Staggers, MD  FeFum-FePoly-FA-B Cmp-C-Biot (INTEGRA PLUS) CAPS TAKE ONE CAPSULE BY MOUTH EVERY DAY *NOT COVERED* 12/22/15   Curt Bears, MD  fluticasone (FLOVENT HFA) 220 MCG/ACT inhaler Inhale 1 puff into the lungs 2 (two) times daily. Reported on 01/20/2016    Historical Provider, MD  furosemide (LASIX) 20 MG tablet Take 1 tablet (20 mg total) by mouth daily as needed for fluid or edema. 08/29/15   Josue Hector, MD  glimepiride (AMARYL) 1 MG tablet Take 1 mg by mouth daily.     Historical Provider, MD  montelukast (SINGULAIR) 10 MG tablet Take 10 mg by mouth at bedtime. 06/04/15   Historical Provider, MD  Multiple Vitamin (MULITIVITAMIN WITH MINERALS) TABS Take 1 tablet by mouth daily.    Historical Provider, MD  nitroGLYCERIN (NITROSTAT) 0.4 MG SL tablet Place 0.4 mg under the tongue every 5 (five) minutes as needed for chest pain (3 DOSES MAX).    Historical Provider, MD  Omega-3 Fatty Acids (FISH OIL) 1200 MG CAPS Take 1 capsule by mouth daily.    Historical Provider, MD  omeprazole (PRILOSEC) 20 MG capsule Take 20 mg by mouth daily before breakfast. 06/04/15   Historical Provider, MD  phenazopyridine (PYRIDIUM) 200 MG tablet Take 1 tablet (200 mg total) by mouth 3 (three) times daily as needed for pain. 01/29/16   Lajean Saver, MD  pioglitazone (ACTOS) 15 MG tablet Take 7.5 mg by mouth daily.     Historical Provider, MD  potassium chloride (K-DUR,KLOR-CON) 10 MEQ tablet Take 20 mEq by mouth daily.    Historical Provider, MD  Probiotic Product (PROBIOTIC PO) Take 1 tablet by mouth daily.    Historical Provider, MD  Spacer/Aero-Holding Chambers (AEROCHAMBER MINI CHAMBER) DEVI use as directed with your inhalers  04/27/11   Historical Provider, MD   BP 105/72 mmHg  Pulse 113  Temp(Src) 97.3 F (36.3 C) (Oral)  Resp 18  SpO2 98% Physical Exam  Constitutional: She is oriented to person, place, and time. She appears well-developed and well-nourished.  HENT:  Head: Normocephalic and  atraumatic.  Cardiovascular: Normal rate.   Pulmonary/Chest: Effort normal.  Musculoskeletal: Normal range of motion. She exhibits tenderness.  RUE: mild tenderness to right wrist without point tenderness.  Wrist and hand is mildly erythematous and edematou as compare to L wrist. Normal dorsiflexion, plantar flexion, normal wrist flexion and extension without gross deformity. Radial pulse 2+, normal grip strength.  Right forearm is non tender, compartment is soft. Right elbow normal ROM. Right hand with good grip strength.   Neurological: She is alert and oriented to person, place, and time.  Skin: Skin is warm and dry.  Psychiatric: She has a normal mood and affect.  Nursing note and vitals reviewed.   ED Course  Procedures (including critical care time)  DIAGNOSTIC STUDIES: Oxygen Saturation is 98% on RA, normal by my interpretation.    COORDINATION OF CARE: 3:31 PM Discussed treatment plan with pt at bedside and pt agreed to plan.   Imaging Review Dg Wrist Complete Right  03/01/2016  CLINICAL DATA:  Choking and chopping food on Saturday. Severe pain on the anterior side of wrist. EXAM: RIGHT WRIST - COMPLETE 3+ VIEW COMPARISON:  09/26/2009 FINDINGS: The bones appear diffusely osteopenic. There is no acute fracture or dislocation. No radiopaque foreign bodies identified. IMPRESSION: 1. Osteopenia. 2. No acute findings. Electronically Signed   By: Kerby Moors M.D.   On: 03/01/2016 15:10   I have personally reviewed and evaluated these images as part of my medical decision-making.   MDM   Final diagnoses:  None   Suspect arthralgia causing her pain.  No evidence of cellulitis or septic joint.  Doubt DVT.  Pt is NVI.  Patient X-Ray negative for obvious fracture or dislocation.  Pt advised to follow up with orthopedics. Patient given return precaution while in ED, conservative therapy recommended and discussed. Patient will be discharged home & is agreeable with above plan. Returns  precautions discussed. Pt appears safe for discharge.  Pt has ACE wrap and wrist splint available.  Pt understand to return if she developed cp or sob.  Care discussed with Dr. Darl Householder.  BP 119/59 mmHg  Pulse 64  Temp(Src) 97.3 F (36.3 C) (Oral)  Resp 18  SpO2 98%  I personally performed the services described in this documentation, which was scribed in my presence. The recorded information has been reviewed and is accurate.     Domenic Moras, PA-C 03/01/16 1601  Gareth Morgan, MD 03/02/16 1308

## 2016-03-01 NOTE — ED Notes (Signed)
Declined W/C at D/C and was escorted to lobby by RN. 

## 2016-03-01 NOTE — ED Notes (Signed)
Called for patient in all areas of lobby.  No answer

## 2016-03-01 NOTE — ED Notes (Signed)
Pt was cooking and chopping food and started having pain to right wrist.

## 2016-03-01 NOTE — Discharge Instructions (Signed)
?????? (???????????) (Osteoarthritis) ?????? ???????? ????????????, ??????? ?????????????? ?????????????? ? ??????????? ???? ??? ????? ???????. ??? ??????????, ????? ? ?????????? ??????? ?????????? ???????? ?????. ???? ????????? ? ???????? ???????, ???????????? ????? ??????, ??? ??? ???????????, ????? ???????????? ??????. ?????? ???????? ???????? ???????????????? ?????? ???????. ?? ????? ????????? ? ??????? ?????. ??? ???? ???????? ????? ?????????? ????????? ???????:  ?? ?????? ???????.  ? ??????? ??????? ???.  ? ??????? ???.  ? ?????? ????? ????????????.  ????????.  ?????????????. ???????  ?? ????????, ????, ??????? ????????? ????? ??????, ???????? ?????????. ??? ???????? ? ????, ??? ????? ???????? ?????? ???? ? ?????, ??????? ???? ? ??????????? ? ?????????? ????????.  ??????? ????? ????????? ??????? ???????? ???? ????????????? ????????????, ? ??? ?????:  ??????? ???????.  ?????????? ????? ????.  ?????????? ????????????? ????????.  ?????????? ?????? ???????. ???????? ? ????????   ????, ???? ? ??????????? ???????? ? ?????????? ???????.  ?? ???????? ?????? ????? ???????? ???? ?????????? ?????.  ?? ?????? [??????] ??????? ????? ????????? ????????? ??????? ???? (?????????).  ??????? ????? ??? ????? ????? ?????????? ? ??????? ? ????????? ????. ??? ????? ?????????????? ???????? ????? ? ??????????? [???????]. ???????  ??? ??????? ???? ???????? ?????? ? ?????????? ??? ? ?????????. ????? ???? ????????? ????????? ????????????:  ?????????????? ??????????? ???????.  ??????? ?????, ????? ????????? ?????? ???? ???????. ??? ??????????? ?????? ????????? ????? ????????????? ?????????????? ?????. ???????  ???? ??????? ??????????? ? ?????????? ???? ? ????????? ??????? ???????. ??????? ????? ????????:  ???????????? ????????? ??????????, ??????? ????????? ?????????? ?????? ? ?????????? ??? ?????.  ???? ???????? ????? ????.  ?????? ?????????????, ????? ???:  ?????????? ?????????? ?????  ? ??????.  ??????????? ????????????? ????????? ?? ??????? ????????? ??? ????? (?????????? ????????????? ?????????? ?????? [TENS]).  ??????.  ????????? ??????? ???????.  ?????????????? ????????????? ????????, ????? ???:  ????????????.  ???????????? ????????????????????? ????????? (????), ????? ??? ?????????.  ????????????? ???????? ??? ???????? ???????????? ????????, ????? ??? ????????.  ???????????????. ??? ????? ???? ???????? ??? ?????? ?????? ??? ? ???? ????????.  ????????????? ????????? ????????? ?????? ? ?????????? ???????? ???????? (??????????) ??? ???????? ???????? ??????? ???????? ?????? ? ???????? ?????. ? ?????? ?????? ????????? ???????????? ????? ????????????? ?????? (??????????????) ???????. ?????????? ?? ????? ? ???????? ????????   ?????????? ????????????? ????????? ?????? ? ???????????? ? ?????????? ?????.  ????????????? ?????????? ???. ? ???? ???????? ????? ???? ???????? ????????? ?????? ?????. ??? ????? ???????? ???????? ? ????????? ??????? ???????.  ?????????? ?????????? ?????????? ? ???????????? ? ??????? ??? ??????????. ??? ??????? ???? ????? ????????????? ?????????? ???? ??????????. ????? ??? ????? ???? ?????????:  ??????????? ??????????. ?? ????????? ??? ?????????? ????, ?????????????? ??????, ?????????? ????????. ?? ????? ????????? ? ?????????? ?????? ??? ???????????.  ???????? ???? ????????. ??? ????? ??????????, ??? ??????? ?????? ??? ????????????????? ????????, ??????????? ???????? ?????????? ????????? ???????????? [? ???????? ????? ??????????].  ?????????? ?? ??????????? ????????? ????????. ??? ????????? ???????????? ???? ??????? ???????.  ?????????? ?? ??????????? ?????????? ? ????????. ??? ???????? ????????? ?????? ? ???????????? ?????.  ??????? ????? ?????????? ????????, ? ???????????? ? ?????????? ?????? ?????.  ????????? ? ????? ?? ??? ??????????? ??????. ?????????? ? ?????, ????:   ???? [??? ????????] ????? ???????  ?????? ???? ? ???????? ? ???  ????????? ????.  ???? ? ??????? ?????????.  ?????? ???? ? ???????? ??? ???????? ????? ? ??? ????????? ?????????. ?????????? ?????????? ? ?????, ????:  ? ??? ????????? ???????????? ?????? ???? ? ????????.  ? ??? ????????? ?????? ????. ?????????????? ??????????   ?????? ????????????? ????????? ??????? ? ??????????? ???? ? ??????-????????????? ????????: [email or web address]  ?????? ????????????? ????????? ??????? ????????: [email or web address]  ???????????? ???????? ????????????: [email or web address]   ??? ?????????? ?? ????? ???????? ??????, ??????????????? ????? ??????. ??????????? ???????? ????? ???????????? ??? ??????? ? ????? ??????? ??????.  Document Released: 10/11/2005 Document Revised: 11/01/2014 Elsevier Interactive Patient Education [employer].  ???? ? ??????? ???????? ([place]) ??? ???? ? ??????? ???????? ?????????? ????? ??????. ????????? ????? ????????????? ??????:  ?????? ? ??????? ????????, ????????, ??????????, ?????????? ??? ???????.  ?????????? ????????????? ???????????????? ???????.  ?????????, ??????? ???????? ?????????? ???????? ?? ???? ? ??????? ???????? (??????? ?????????? ??????).  "???????????" ????????, ??????????? ? ????????? (???????????).  ????? ??? ?????? ????? ???????. ?????? ??????? ???? ? ??????? ???????? ???????? ???????????. ???? ????? ????????, ????? ?? ???????? ??????????? ?????? ????? ? ????????? ?????????? ???? ? ???????? ????????. ???? ???? ? ???????? ????????, ??? ??????????? ?????? ????????? ????? ????????????? ?????????????? ????????????. ?????????? ?? ????? ? ???????? ???????? ????????? ???????? ?? ????? ????????? ????? ?????????. ??? ?????????? ???? ????????????? ????????? ????????:  ?? ??????? ???????? ?? ??????? ???????? ? ??????? 48 ????? ??? ???????? ??????????? ?????.  ????????????? ??? ? ?????????????? ???????, ???? ??? ??? ?????????:  ???????? ??? ? ??????????? ?????.  ???????? ????? ??????????.  ??????? ????????  ??????? ???????????? ?? 20 ????? 2-3 ???? ? ????.  ????? ?? ?????? ??? ??????, ??????? ??????? ???? ?????? (???????????) -- ???? ?????? ??????.  ???? ???? ???????? ??????? ??? ?????????? ????, ??????????? ??? ??? ??????? ????? ??????.  ???????? ?? ? ?????? ???????????? ? ?????????????? ???????? ?????.  ???????? ??????? ??? ????, ???? ?? ?????????? ???????? ? ??????????? ? ???????, ??? ???? ??? ?????????? ????????? ??? ??????.  ?????????? ?????? ??????????????? ????? ?????? ?????????????? ? ??????????? ?????????.  ????????? ? ?????? ????? ?? ??? ??????????? ??????. ??? ?????. ?????????? ? ?????, ????:  ???? ?????????? ?????????? ??????? ????????.  ???? ?????????. ?????????? ?????????? ? ?????, ????:  ???? ?????? ????? ???????.  ?????? ? ??? ????????, ?????? ??????????, ??? ?????????? ?? ??????????? ? ?????????.  ????????? ???????? ??? ??????????? ? ???????.  ?? ? ?????? ???????? ???????? ??????.   ??? ?????????? ?? ????? ???????? ??????, ??????????????? ????? ??????. ??????????? ???????? ????? ???????????? ??? ??????? ? ????? ??????? ??????.   Document Released: 10/11/2005 Document Revised: 07/02/2015 Elsevier Interactive Patient Education [employer].

## 2016-03-02 DIAGNOSIS — Z85118 Personal history of other malignant neoplasm of bronchus and lung: Secondary | ICD-10-CM | POA: Diagnosis not present

## 2016-03-02 DIAGNOSIS — Z7951 Long term (current) use of inhaled steroids: Secondary | ICD-10-CM | POA: Diagnosis not present

## 2016-03-02 DIAGNOSIS — I872 Venous insufficiency (chronic) (peripheral): Secondary | ICD-10-CM | POA: Diagnosis not present

## 2016-03-02 DIAGNOSIS — I8393 Asymptomatic varicose veins of bilateral lower extremities: Secondary | ICD-10-CM | POA: Diagnosis not present

## 2016-03-02 DIAGNOSIS — M25531 Pain in right wrist: Secondary | ICD-10-CM | POA: Diagnosis not present

## 2016-03-02 DIAGNOSIS — Z9889 Other specified postprocedural states: Secondary | ICD-10-CM | POA: Diagnosis not present

## 2016-03-02 DIAGNOSIS — Z7982 Long term (current) use of aspirin: Secondary | ICD-10-CM | POA: Diagnosis not present

## 2016-03-02 DIAGNOSIS — S81801D Unspecified open wound, right lower leg, subsequent encounter: Secondary | ICD-10-CM | POA: Diagnosis not present

## 2016-03-02 DIAGNOSIS — Z79891 Long term (current) use of opiate analgesic: Secondary | ICD-10-CM | POA: Diagnosis not present

## 2016-03-02 DIAGNOSIS — Z79899 Other long term (current) drug therapy: Secondary | ICD-10-CM | POA: Diagnosis not present

## 2016-03-02 DIAGNOSIS — D3A090 Benign carcinoid tumor of the bronchus and lung: Secondary | ICD-10-CM | POA: Diagnosis not present

## 2016-03-02 DIAGNOSIS — M79641 Pain in right hand: Secondary | ICD-10-CM | POA: Diagnosis not present

## 2016-03-02 DIAGNOSIS — E119 Type 2 diabetes mellitus without complications: Secondary | ICD-10-CM | POA: Diagnosis not present

## 2016-03-02 DIAGNOSIS — J449 Chronic obstructive pulmonary disease, unspecified: Secondary | ICD-10-CM | POA: Diagnosis not present

## 2016-03-02 DIAGNOSIS — I714 Abdominal aortic aneurysm, without rupture: Secondary | ICD-10-CM | POA: Diagnosis not present

## 2016-03-02 DIAGNOSIS — I10 Essential (primary) hypertension: Secondary | ICD-10-CM | POA: Diagnosis not present

## 2016-03-02 DIAGNOSIS — E785 Hyperlipidemia, unspecified: Secondary | ICD-10-CM | POA: Diagnosis not present

## 2016-03-03 DIAGNOSIS — S81801S Unspecified open wound, right lower leg, sequela: Secondary | ICD-10-CM | POA: Diagnosis not present

## 2016-03-03 DIAGNOSIS — L97812 Non-pressure chronic ulcer of other part of right lower leg with fat layer exposed: Secondary | ICD-10-CM | POA: Diagnosis not present

## 2016-03-03 DIAGNOSIS — L98492 Non-pressure chronic ulcer of skin of other sites with fat layer exposed: Secondary | ICD-10-CM | POA: Diagnosis not present

## 2016-03-03 DIAGNOSIS — T148 Other injury of unspecified body region: Secondary | ICD-10-CM | POA: Diagnosis not present

## 2016-03-03 DIAGNOSIS — R600 Localized edema: Secondary | ICD-10-CM | POA: Diagnosis not present

## 2016-03-03 DIAGNOSIS — I83018 Varicose veins of right lower extremity with ulcer other part of lower leg: Secondary | ICD-10-CM | POA: Diagnosis not present

## 2016-03-03 DIAGNOSIS — S81801D Unspecified open wound, right lower leg, subsequent encounter: Secondary | ICD-10-CM | POA: Diagnosis not present

## 2016-03-03 DIAGNOSIS — I878 Other specified disorders of veins: Secondary | ICD-10-CM | POA: Diagnosis not present

## 2016-03-03 DIAGNOSIS — E119 Type 2 diabetes mellitus without complications: Secondary | ICD-10-CM | POA: Diagnosis not present

## 2016-03-04 ENCOUNTER — Ambulatory Visit (INDEPENDENT_AMBULATORY_CARE_PROVIDER_SITE_OTHER): Payer: Medicare Other | Admitting: Nurse Practitioner

## 2016-03-04 ENCOUNTER — Encounter: Payer: Self-pay | Admitting: Nurse Practitioner

## 2016-03-04 ENCOUNTER — Ambulatory Visit: Payer: Medicare Other | Admitting: Physician Assistant

## 2016-03-04 VITALS — BP 142/72 | HR 66 | Ht 64.0 in | Wt 164.0 lb

## 2016-03-04 DIAGNOSIS — I10 Essential (primary) hypertension: Secondary | ICD-10-CM

## 2016-03-04 DIAGNOSIS — I7121 Aneurysm of the ascending aorta, without rupture: Secondary | ICD-10-CM

## 2016-03-04 DIAGNOSIS — E119 Type 2 diabetes mellitus without complications: Secondary | ICD-10-CM | POA: Insufficient documentation

## 2016-03-04 DIAGNOSIS — I712 Thoracic aortic aneurysm, without rupture: Secondary | ICD-10-CM | POA: Diagnosis not present

## 2016-03-04 DIAGNOSIS — I059 Rheumatic mitral valve disease, unspecified: Secondary | ICD-10-CM | POA: Diagnosis not present

## 2016-03-04 DIAGNOSIS — I359 Nonrheumatic aortic valve disorder, unspecified: Secondary | ICD-10-CM

## 2016-03-04 NOTE — Patient Instructions (Signed)
Medication Instructions:  Your physician recommends that you continue on your current medications as directed. Please refer to the Current Medication list given to you today. 1.  TAKE YOUR HYDRALAZINE MEDICATION THREE TIMES A DAY  Labwork: None ordered  Testing/Procedures: None ordered  Follow-Up: Your physician recommends that you schedule a follow-up appointment in: [place]   Any Other Special Instructions Will Be Listed Below (If Applicable).     If you need a refill on your cardiac medications before your next appointment, please call your pharmacy.

## 2016-03-04 NOTE — Progress Notes (Signed)
Office Visit    Patient Name: CAMDEN MAZZAFERRO Date of Encounter: 03/04/2016  Primary Care Provider:  Apolonio Schneiders, MD Primary Cardiologist:  P. Johnsie Cancel, MD   Chief Complaint    82 year old female with history of hypertension, mitral and aortic valvular disease, and descending aortic aneurysm, who presents for follow-up.  Past Medical History    Past Medical History  Diagnosis Date  . Type II diabetes mellitus ([place])   . Essential hypertension   . Coronary artery disease     a. Ca2+ noted on CT - no other clear documentation of CAD.  Marland Kitchen Moderate aortic insufficiency     a. 10/2015 Echo: EF 50-55%, no rwma, mod AI, mod MS, mild MR, sev dil LA, mild TR/PR, PASP 2mHg.  .Marland KitchenHyperlipidemia   . Ascending aortic aneurysm ([place]     a. 08/2015 stable 5 cm Asc Ao Aneurysm.  .Marland KitchenHypercholesterolemia   . COPD (chronic obstructive pulmonary disease) ([place]   . Bronchitis   . Edema   . GERD (gastroesophageal reflux disease)   . Pain in joint, forearm   . Pain in joint, hand   . Pain in joint, lower leg   . Difficulty in walking(719.7)   . Disturbance of skin sensation   . Carcinoid tumor   . Mitral valve disease     a. 10/2015 Echo: Mod MS, mild MR.  . Pulmonary nodules     s. 08/2015 CT: stable lung nodules.   Past Surgical History  Procedure Laterality Date  . Pars plana vitrectomy      right eye  . Posterior capsulectomy      right eye  . Pars plana vitrectomy w/ endophotocoagulation      right eye  . Fiberoptic bronch with endobronchial u/s  07/02/2010  . Video bronchoscopy  12/09/2009  . 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 Bronch with endobronchial biopies  09/15/2009  . Colonoscopy N/A 09/13/2013    Procedure: COLONOSCOPY;  Surgeon: MJeryl Columbia MD;  Location: [facility];  Service: Endoscopy;  Laterality: N/A;    Allergies  No Known Allergies  History of Present Illness    82year old female with the above complex  past medical history. She has an ascending aortic aneurysm that has been stable at 5 cm for some time, and is followed closely by thoracic surgery. She also has aortic and mitral valve disease. She is not felt to be a candidate for surgery and thus has been conservatively managed over the years. She also has a history of labile hypertension and has been on a variety of medication regimens over the years. Her daughter is with her today and assists in translation and drives the conversation. Her daughter says that the patient's blood pressure has been variable at home, sometimes in the 160s to 170s in the morning. She's been compliant with her medications although she has only been taking hydralazine twice a day instead of 3 times a day. The patient offers no complaints and denies chest pain or dyspnea. There is no history of PND, orthopnea, dizziness, syncope, or early satiety. She does have chronic right greater than left lower extremity edema and is being evaluated in vein clinic for laser ablation of the right great saphenous vein to facilitate wound healing on that side and also help prevent future stasis ulcers.  Home Medications    Prior to Admission medications   Medication Sig Start Date End Date Taking? Authorizing Provider  albuterol (PROVENTIL HFA;VENTOLIN HFA) 108 (90 Base) MCG/ACT inhaler Inhale 2 puffs into the lungs every 6 (six) hours as needed for wheezing or shortness of breath.   Yes Historical Provider, MD  amLODipine (NORVASC) 10 MG tablet TAKE 1 TABLET (10 MG TOTAL) BY MOUTH DAILY. 01/27/16  Yes Josue Hector, MD  aspirin EC 81 MG tablet Take 81 mg by mouth every evening. 7 pm   Yes Historical Provider, MD  atorvastatin (LIPITOR) 10 MG tablet Take 10 mg by mouth at bedtime. 06/17/15  Yes Historical Provider, MD  BENICAR 40 MG tablet TAKE 1 TABLET (40 MG TOTAL) BY MOUTH 2 (TWO) TIMES DAILY. 10/21/15  Yes Josue Hector, MD  FeFum-FePoly-FA-B Cmp-C-Biot (INTEGRA PLUS) CAPS TAKE ONE  CAPSULE BY MOUTH EVERY DAY *NOT COVERED* 12/22/15  Yes Curt Bears, MD  fluticasone (FLOVENT HFA) 110 MCG/ACT inhaler Inhale 2 puffs into the lungs 2 (two) times daily.   Yes Historical Provider, MD  furosemide (LASIX) 20 MG tablet Take 1 tablet (20 mg total) by mouth daily as needed for fluid or edema. 08/29/15  Yes Josue Hector, MD  glimepiride (AMARYL) 1 MG tablet Take 1 mg by mouth daily.    Yes Historical Provider, MD  hydrALAZINE (APRESOLINE) 50 MG tablet Take 50 mg by mouth 3 (three) times daily.    Yes Historical Provider, MD  montelukast (SINGULAIR) 10 MG tablet Take 10 mg by mouth at bedtime. 06/04/15  Yes Historical Provider, MD  Multiple Vitamin (MULITIVITAMIN WITH MINERALS) TABS Take 1 tablet by mouth daily.   Yes Historical Provider, MD  nitroGLYCERIN (NITROSTAT) 0.4 MG SL tablet Place 0.4 mg under the tongue every 5 (five) minutes as needed for chest pain (3 DOSES MAX).   Yes Historical Provider, MD  Omega-3 Fatty Acids (FISH OIL) 1200 MG CAPS Take 1 capsule by mouth daily.   Yes Historical Provider, MD  omeprazole (PRILOSEC) 20 MG capsule Take 20 mg by mouth daily before breakfast. 06/04/15  Yes Historical Provider, MD  pioglitazone (ACTOS) 15 MG tablet Take 7.5 mg by mouth daily.    Yes Historical Provider, MD  potassium chloride (K-DUR,KLOR-CON) 10 MEQ tablet Take 20 mEq by mouth daily.   Yes Historical Provider, MD  Probiotic Product (PROBIOTIC PO) Take 1 tablet by mouth daily.   Yes Historical Provider, MD  Spacer/Aero-Holding Chambers (AEROCHAMBER MINI CHAMBER) DEVI use as directed with your inhalers  04/27/11  Yes Historical Provider, MD    Review of Systems     as above, patient has been doing recently well. She has chronic right lower extremity edema and is followed in wound clinic with improved healing. No recent history of chest pain, dyspnea, PND, orthopnea or dizziness, syncope, or early satiety.  All other systems reviewed and are otherwise negative except as noted  above.  Physical Exam    VS:  BP 142/72 mmHg  Pulse 66  Ht '5\' 4"'$  (1.626 m)  Wt 164 lb (74.39 kg)  BMI 28.14 kg/m2 , BMI Body mass index is 28.14 kg/(m^2). GEN: Well nourished, well developed, in no acute distress. HEENT: normal. Neck: Supple, no JVD, carotid bruits, or masses. Cardiac: RRR, 3/6 SEM  And Diast murmur loudest @ upper sternal border but heard throughout.  No rubs, or gallops. No clubbing, cyanosis, edema.  Radials/DP/PT 2+ and equal bilaterally.  Respiratory:  Respirations regular and unlabored, clear to auscultation bilaterally. GI: Soft, nontender, nondistended, BS + x 4. MS: no deformity or atrophy. Skin: warm and dry, no rash. Neuro:  Strength and sensation are intact. Psych: Normal affect.  Accessory Clinical Findings     regular sinus rhythm, 66,  Left axis deviation , left anterior fascicular block ,first-degree AV block, PACs , PVC, poor R progression -no acute ST or T changes.  Assessment & Plan    1.   Essential hypertension:  Patient's blood pressure remains variable at home. Her daughter notes that it is often in the 160s or 170s in the morning. We reviewed  The patient's medication list and noted that she is supposed be taking hydralazine 50 mrem 3 times a day. Currently, her daughter is only giving it to her twice a day. I recommended that she take it as prescribed-3 times a day for more consistent blood pressure management. She otherwise remains on Benicar and amlodipine.   2.  Ascending aortic aneurysm: stable at 5 cm based on CT in November 2016. She is not felt to be a surgical candidate. She is being managed conservatively by Dr. Roxan Hockey. As her blood pressure has been higher at home, we are reinforcing that she take her hydralazine as prescribed-3 times a day.   3. Mitral and aortic valve disease: conservatively managed in the setting of poor surgical candidacy.   4. Disposition: patient will follow-up with Dr. Johnsie Cancel as scheduled in 2-3  mos.   Murray Hodgkins, NP 03/04/2016, 3:54 PM

## 2016-03-05 ENCOUNTER — Telehealth: Payer: Self-pay | Admitting: Physical Medicine & Rehabilitation

## 2016-03-05 DIAGNOSIS — M1611 Unilateral primary osteoarthritis, right hip: Secondary | ICD-10-CM

## 2016-03-05 NOTE — Telephone Encounter (Signed)
Daughter would like to for her mother to have therapy.  Please call her back.

## 2016-03-08 NOTE — Telephone Encounter (Signed)
Spoke with patient's daughter. She states that patient would benefit from Physical Therapy. Please Advise.

## 2016-03-08 NOTE — Addendum Note (Signed)
Addended by: Gerald Leitz A on: 03/08/2016 10:45 AM   Modules accepted: Orders

## 2016-03-08 NOTE — Telephone Encounter (Signed)
May place orders for physical therapy and [name] for right hip osteoarthritis

## 2016-03-09 ENCOUNTER — Encounter: Payer: Self-pay | Admitting: Vascular Surgery

## 2016-03-11 ENCOUNTER — Other Ambulatory Visit: Payer: Self-pay

## 2016-03-11 DIAGNOSIS — Z1231 Encounter for screening mammogram for malignant neoplasm of breast: Secondary | ICD-10-CM

## 2016-03-16 ENCOUNTER — Encounter: Payer: Self-pay | Admitting: Vascular Surgery

## 2016-03-16 ENCOUNTER — Ambulatory Visit (INDEPENDENT_AMBULATORY_CARE_PROVIDER_SITE_OTHER): Payer: Medicare Other | Admitting: Vascular Surgery

## 2016-03-16 VITALS — BP 140/68 | HR 80 | Temp 98.0°F | Resp 16 | Ht 60.0 in | Wt 164.0 lb

## 2016-03-16 DIAGNOSIS — L97919 Non-pressure chronic ulcer of unspecified part of right lower leg with unspecified severity: Principal | ICD-10-CM | POA: Insufficient documentation

## 2016-03-16 DIAGNOSIS — I83019 Varicose veins of right lower extremity with ulcer of unspecified site: Secondary | ICD-10-CM

## 2016-03-16 NOTE — Progress Notes (Signed)
Subjective:     Patient ID: [name], female   DOB: 1932-02-14, 82 y.o.   MRN: [id number]  HPI this 82 year old female is accompanied by her daughter. She is [place] and the daughter speaks [place] but the patient does not. Patient was seen by me in March of this year with stasis ulcer right leg having been referred by the wound center. She has gross reflux in her right great saphenous vein and we recommended laser ablation. Family could not make a decision whether to proceed. In the meantime the ulcer has improved and almost completely healed. They came today to get fitted for more stockings and to further discuss this. She has no history of stasis ulcers and contralateral left leg.  Past Medical History  Diagnosis Date  . Type II diabetes mellitus ([place])   . Essential hypertension   . Coronary artery disease     a. Ca2+ noted on CT - no other clear documentation of CAD.  Marland Kitchen Moderate aortic insufficiency     a. 10/2015 Echo: EF 50-55%, no rwma, mod AI, mod MS, mild MR, sev dil LA, mild TR/PR, PASP 24mHg.  .Marland KitchenHyperlipidemia   . Ascending aortic aneurysm ([place]     a. 08/2015 stable 5 cm Asc Ao Aneurysm.  .Marland KitchenHypercholesterolemia   . COPD (chronic obstructive pulmonary disease) ([place]   . Bronchitis   . Edema   . GERD (gastroesophageal reflux disease)   . Pain in joint, forearm   . Pain in joint, hand   . Pain in joint, lower leg   . Difficulty in walking(719.7)   . Disturbance of skin sensation   . Carcinoid tumor   . Mitral valve disease     a. 10/2015 Echo: Mod MS, mild MR.  . Pulmonary nodules     s. 08/2015 CT: stable lung nodules.    Social History  Substance Use Topics  . Smoking status: Never Smoker   . Smokeless tobacco: Never Used  . Alcohol Use: No    Family History  Problem Relation Age of Onset  . Hypertension Mother   . Hypertension Father   . Heart attack Neg Hx   . Stroke Neg Hx     No Known Allergies   Current outpatient prescriptions:  .  albuterol  (PROVENTIL HFA;VENTOLIN HFA) 108 (90 Base) MCG/ACT inhaler, Inhale 2 puffs into the lungs every 6 (six) hours as needed for wheezing or shortness of breath., Disp: , Rfl:  .  amLODipine (NORVASC) 10 MG tablet, TAKE 1 TABLET (10 MG TOTAL) BY MOUTH DAILY., Disp: 30 tablet, Rfl: 5 .  aspirin EC 81 MG tablet, Take 81 mg by mouth every evening. 7 pm, Disp: , Rfl:  .  atorvastatin (LIPITOR) 10 MG tablet, Take 10 mg by mouth at bedtime., Disp: , Rfl: 12 .  BENICAR 40 MG tablet, TAKE 1 TABLET (40 MG TOTAL) BY MOUTH 2 (TWO) TIMES DAILY., Disp: 60 tablet, Rfl: 11 .  FeFum-FePoly-FA-B Cmp-C-Biot (INTEGRA PLUS) CAPS, TAKE ONE CAPSULE BY MOUTH EVERY DAY *NOT COVERED*, Disp: 30 capsule, Rfl: 5 .  fluticasone (FLOVENT HFA) 110 MCG/ACT inhaler, Inhale 2 puffs into the lungs 2 (two) times daily., Disp: , Rfl:  .  furosemide (LASIX) 20 MG tablet, Take 1 tablet (20 mg total) by mouth daily as needed for fluid or edema., Disp: 30 tablet, Rfl: 11 .  glimepiride (AMARYL) 1 MG tablet, Take 1 mg by mouth daily. , Disp: , Rfl:  .  hydrALAZINE (APRESOLINE) 50 MG tablet,  Take 50 mg by mouth 3 (three) times daily. , Disp: , Rfl:  .  montelukast (SINGULAIR) 10 MG tablet, Take 10 mg by mouth at bedtime., Disp: , Rfl: 1 .  Multiple Vitamin (MULITIVITAMIN WITH MINERALS) TABS, Take 1 tablet by mouth daily., Disp: , Rfl:  .  nitroGLYCERIN (NITROSTAT) 0.4 MG SL tablet, Place 0.4 mg under the tongue every 5 (five) minutes as needed for chest pain (3 DOSES MAX)., Disp: , Rfl:  .  Omega-3 Fatty Acids (FISH OIL) 1200 MG CAPS, Take 1 capsule by mouth daily., Disp: , Rfl:  .  omeprazole (PRILOSEC) 20 MG capsule, Take 20 mg by mouth daily before breakfast., Disp: , Rfl: 5 .  pioglitazone (ACTOS) 15 MG tablet, Take 7.5 mg by mouth daily. , Disp: , Rfl:  .  potassium chloride (K-DUR,KLOR-CON) 10 MEQ tablet, Take 20 mEq by mouth daily., Disp: , Rfl:  .  Probiotic Product (PROBIOTIC PO), Take 1 tablet by mouth daily., Disp: , Rfl:  .   Spacer/Aero-Holding Chambers (AEROCHAMBER MINI CHAMBER) DEVI, use as directed with your inhalers , Disp: , Rfl:   Filed Vitals:   03/16/16 1416  BP: 140/68  Pulse: 80  Temp: 98 F (36.7 C)  Resp: 16  Height: 5' (1.524 m)  Weight: 164 lb (74.39 kg)  SpO2: 97%    Body mass index is 32.03 kg/(m^2).           Review of Systems denies chest pain, dyspnea on exertion, PND, orthopnea     Objective:   Physical Exam BP 140/68 mmHg  Pulse 80  Temp(Src) 98 F (36.7 C)  Resp 16  Ht 5' (1.524 m)  Wt 164 lb (74.39 kg)  BMI 32.03 kg/m2  SpO2 97%  General elderly female no apparent distress does not speak [place] Lungs no rhonchi or wheezing Right leg with thinned attenuated skin lateral third of lower leg. Ulceration has healed. 2+ dorsalis pedis pulse palpable.  I reviewed the venous duplex exam of both legs which was performed March 2017 which revealed gross reflux in bilateral great saphenous veins.     Assessment:     I once again recommended laser ablation right great saphenous vein because of gross reflux and history of poorly healing ulcer right leg    Plan:     Once again family is not ready to make a decision regarding this We measured patient for new air of elastic compression stockings 20-30 millimeter gradient They will notify us if they would like to proceed with this procedure. I emphasized to them that she does have a higher rate of recurrence of the ulcer without the procedure

## 2016-03-23 DIAGNOSIS — N3941 Urge incontinence: Secondary | ICD-10-CM | POA: Diagnosis not present

## 2016-03-23 DIAGNOSIS — Z0000 Encounter for general adult medical examination without abnormal findings: Secondary | ICD-10-CM | POA: Diagnosis not present

## 2016-03-29 DIAGNOSIS — L98492 Non-pressure chronic ulcer of skin of other sites with fat layer exposed: Secondary | ICD-10-CM | POA: Diagnosis not present

## 2016-03-29 DIAGNOSIS — S81801S Unspecified open wound, right lower leg, sequela: Secondary | ICD-10-CM | POA: Diagnosis not present

## 2016-03-29 DIAGNOSIS — L97812 Non-pressure chronic ulcer of other part of right lower leg with fat layer exposed: Secondary | ICD-10-CM | POA: Diagnosis not present

## 2016-03-29 DIAGNOSIS — I878 Other specified disorders of veins: Secondary | ICD-10-CM | POA: Diagnosis not present

## 2016-03-29 DIAGNOSIS — I83018 Varicose veins of right lower extremity with ulcer other part of lower leg: Secondary | ICD-10-CM | POA: Diagnosis not present

## 2016-03-29 DIAGNOSIS — E119 Type 2 diabetes mellitus without complications: Secondary | ICD-10-CM | POA: Diagnosis not present

## 2016-03-29 DIAGNOSIS — S81801D Unspecified open wound, right lower leg, subsequent encounter: Secondary | ICD-10-CM | POA: Diagnosis not present

## 2016-03-29 DIAGNOSIS — R600 Localized edema: Secondary | ICD-10-CM | POA: Diagnosis not present

## 2016-03-29 DIAGNOSIS — T148 Other injury of unspecified body region: Secondary | ICD-10-CM | POA: Diagnosis not present

## 2016-03-30 DIAGNOSIS — N8111 Cystocele, midline: Secondary | ICD-10-CM | POA: Diagnosis not present

## 2016-03-31 ENCOUNTER — Ambulatory Visit
Admission: RE | Admit: 2016-03-31 | Discharge: 2016-03-31 | Disposition: A | Discharge status: Home / Self Care | Payer: Medicare Other | Source: Ambulatory Visit

## 2016-03-31 DIAGNOSIS — Z1231 Encounter for screening mammogram for malignant neoplasm of breast: Secondary | ICD-10-CM

## 2016-04-05 ENCOUNTER — Encounter (INDEPENDENT_AMBULATORY_CARE_PROVIDER_SITE_OTHER): Payer: Medicare Other

## 2016-04-05 DIAGNOSIS — L821 Other seborrheic keratosis: Secondary | ICD-10-CM | POA: Diagnosis not present

## 2016-04-05 DIAGNOSIS — L720 Epidermal cyst: Secondary | ICD-10-CM | POA: Diagnosis not present

## 2016-04-05 DIAGNOSIS — L570 Actinic keratosis: Secondary | ICD-10-CM | POA: Diagnosis not present

## 2016-04-05 DIAGNOSIS — I83019 Varicose veins of right lower extremity with ulcer of unspecified site: Secondary | ICD-10-CM

## 2016-04-05 DIAGNOSIS — L304 Erythema intertrigo: Secondary | ICD-10-CM | POA: Diagnosis not present

## 2016-04-05 DIAGNOSIS — D225 Melanocytic nevi of trunk: Secondary | ICD-10-CM | POA: Diagnosis not present

## 2016-04-06 ENCOUNTER — Encounter: Payer: Self-pay | Admitting: Sports Medicine

## 2016-04-06 ENCOUNTER — Ambulatory Visit (INDEPENDENT_AMBULATORY_CARE_PROVIDER_SITE_OTHER): Payer: Medicare Other | Admitting: Sports Medicine

## 2016-04-06 DIAGNOSIS — B351 Tinea unguium: Secondary | ICD-10-CM

## 2016-04-06 DIAGNOSIS — M79674 Pain in right toe(s): Secondary | ICD-10-CM

## 2016-04-06 DIAGNOSIS — E119 Type 2 diabetes mellitus without complications: Secondary | ICD-10-CM

## 2016-04-06 DIAGNOSIS — M79675 Pain in left toe(s): Secondary | ICD-10-CM | POA: Diagnosis not present

## 2016-04-06 NOTE — Patient Instructions (Signed)
Diabetes and Foot Care Diabetes may cause you to have problems because of poor blood supply (circulation) to your feet and legs. This may cause the skin on your feet to become thinner, break easier, and heal more slowly. Your skin may become dry, and the skin may peel and crack. You may also have nerve damage in your legs and feet causing decreased feeling in them. You may not notice minor injuries to your feet that could lead to infections or more serious problems. Taking care of your feet is one of the most important things you can do for yourself.  HOME CARE INSTRUCTIONS  Wear shoes at all times, even in the house. Do not go barefoot. Bare feet are easily injured.  Check your feet daily for blisters, cuts, and redness. If you cannot see the bottom of your feet, use a mirror or ask someone for help.  Wash your feet with warm water (do not use hot water) and mild soap. Then pat your feet and the areas between your toes until they are completely dry. Do not soak your feet as this can dry your skin.  Apply a moisturizing lotion or petroleum jelly (that does not contain alcohol and is unscented) to the skin on your feet and to dry, brittle toenails. Do not apply lotion between your toes.  Trim your toenails straight across. Do not dig under them or around the cuticle. File the edges of your nails with an emery board or nail file.  Do not cut corns or calluses or try to remove them with medicine.  Wear clean socks or stockings every day. Make sure they are not too tight. Do not wear knee-high stockings since they may decrease blood flow to your legs.  Wear shoes that fit properly and have enough cushioning. To break in new shoes, wear them for just a few hours a day. This prevents you from injuring your feet. Always look in your shoes before you put them on to be sure there are no objects inside.  Do not cross your legs. This may decrease the blood flow to your feet.  If you find a minor scrape,  cut, or break in the skin on your feet, keep it and the skin around it clean and dry. These areas may be cleansed with mild soap and water. Do not cleanse the area with peroxide, alcohol, or iodine.  When you remove an adhesive bandage, be sure not to damage the skin around it.  If you have a wound, look at it several times a day to make sure it is healing.  Do not use heating pads or hot water bottles. They may burn your skin. If you have lost feeling in your feet or legs, you may not know it is happening until it is too late.  Make sure your health care provider performs a complete foot exam at least annually or more often if you have foot problems. Report any cuts, sores, or bruises to your health care provider immediately. SEEK MEDICAL CARE IF:   You have an injury that is not healing.  You have cuts or breaks in the skin.  You have an ingrown nail.  You notice redness on your legs or feet.  You feel burning or tingling in your legs or feet.  You have pain or cramps in your legs and feet.  Your legs or feet are numb.  Your feet always feel cold. SEEK IMMEDIATE MEDICAL CARE IF:   There is increasing redness,   swelling, or pain in or around a wound.  There is a red line that goes up your leg.  Pus is coming from a wound.  You develop a fever or as directed by your health care provider.  You notice a bad smell coming from an ulcer or wound.   This information is not intended to replace advice given to you by your health care provider. Make sure you discuss any questions you have with your health care provider.   Document Released: 10/08/2000 Document Revised: 06/13/2013 Document Reviewed: 03/20/2013 Elsevier Interactive Patient Education 2016 Elsevier Inc.  

## 2016-04-06 NOTE — Progress Notes (Signed)
Patient ID: [name], female   DOB: 12-Jun-1932, 82 y.o.   MRN: [id number] Subjective: [name] is a 82 y.o. female patient with history of diabetes who presents to office today complaining of long, painful nails  while ambulating in shoes; unable to trim. Patient states that the glucose reading this morning was 92 mg/dl. Patient denies any new changes in medication or new problems. Patient denies any new cramping, numbness, burning or tingling in the legs.  Patient Active Problem List   Diagnosis Date Noted  . Venous ulcer of right leg ([place]) 03/16/2016  . Type II diabetes mellitus ([place])   . Ascending aortic aneurysm ([place])   . Primary osteoarthritis of both hands 01/23/2016  . Varicose veins of right lower extremity with complications 71/69/6789  . Open wound of lower leg 08/26/2015  . Venous ulcer ([place]) 08/26/2015  . Lymphedema of leg 07/23/2015  . Varicose veins of lower extremities with ulcer and inflammation ([place]) 07/23/2015  . Chronic venous insufficiency 07/23/2015  . Leg ulcer ([place]) 07/16/2015  . Ulcer of right lower leg ([place]) 07/12/2015  . Cellulitis of right leg 07/12/2015  . Hyponatremia 07/12/2015  . Chronic diastolic heart failure ([place]) 07/12/2015  . Right hip pain 01/07/2015  . Primary osteoarthritis of right hip 01/07/2015  . Hip contracture 01/07/2015  . AI (aortic incompetence) 08/07/2014  . Diastolic murmur 38/07/1750  . Asthma, mild intermittent 04/04/2013  . Leg varices 03/09/2013  . Asthma, cough variant 10/04/2012  . Eczema intertrigo 05/11/2012  . Basal cell papilloma 05/11/2012  . Carcinoid tumor 10/20/2011  . Allergic rhinitis 07/23/2011  . Airway hyperreactivity 07/23/2011  . Chronic cough 07/23/2011  . Diabetes mellitus type 2 with peripheral artery disease ([place]) 06/19/2009  . HYPERCHOLESTEROLEMIA 06/19/2009  . HYPERLIPIDEMIA 06/19/2009  . Essential hypertension 06/19/2009  . Coronary atherosclerosis 06/19/2009  . Aortic valve regurgitation  06/19/2009  . AORTIC ANEURYSM 06/19/2009  . BRONCHITIS 06/19/2009  . COPD (chronic obstructive pulmonary disease) ([place]) 06/19/2009  . GERD 06/19/2009  . EDEMA 06/19/2009  . 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 albuterol (PROVENTIL HFA;VENTOLIN HFA) 108 (90 Base) MCG/ACT inhaler Inhale 2 puffs into the lungs every 6 (six) hours as needed for wheezing or shortness of breath.    Marland Kitchen amLODipine (NORVASC) 10 MG tablet TAKE 1 TABLET (10 MG TOTAL) BY MOUTH DAILY. 30 tablet 5  . aspirin EC 81 MG tablet Take 81 mg by mouth every evening. 7 pm    . atorvastatin (LIPITOR) 10 MG tablet Take 10 mg by mouth at bedtime.  12  . BENICAR 40 MG tablet TAKE 1 TABLET (40 MG TOTAL) BY MOUTH 2 (TWO) TIMES DAILY. 60 tablet 11  . FeFum-FePoly-FA-B Cmp-C-Biot (INTEGRA PLUS) CAPS TAKE ONE CAPSULE BY MOUTH EVERY DAY *NOT COVERED* 30 capsule 5  . fluticasone (FLOVENT HFA) 110 MCG/ACT inhaler Inhale 2 puffs into the lungs 2 (two) times daily.    . furosemide (LASIX) 20 MG tablet Take 1 tablet (20 mg total) by mouth daily as needed for fluid or edema. 30 tablet 11  . glimepiride (AMARYL) 1 MG tablet Take 1 mg by mouth daily.     . hydrALAZINE (APRESOLINE) 50 MG tablet Take 50 mg by mouth 3 (three) times daily.     . montelukast (SINGULAIR) 10 MG tablet Take 10 mg by mouth at bedtime.  1  . Multiple Vitamin (MULITIVITAMIN WITH MINERALS) TABS Take 1  tablet by mouth daily.    . nitroGLYCERIN (NITROSTAT) 0.4 MG SL tablet Place 0.4 mg under the tongue every 5 (five) minutes as needed for chest pain (3 DOSES MAX).    . Omega-3 Fatty Acids (FISH OIL) 1200 MG CAPS Take 1 capsule by mouth daily.    Marland Kitchen omeprazole (PRILOSEC) 20 MG capsule Take 20 mg by mouth daily before breakfast.  5  . pioglitazone (ACTOS) 15 MG tablet Take 7.5 mg by mouth daily.     . potassium chloride (K-DUR,KLOR-CON) 10 MEQ tablet Take 20 mEq by mouth daily.     . Probiotic Product (PROBIOTIC PO) Take 1 tablet by mouth daily.    Marland Kitchen Spacer/Aero-Holding Chambers (AEROCHAMBER MINI CHAMBER) DEVI use as directed with your inhalers      No current facility-administered medications on file prior to visit.   No Known Allergies  Recent Results (from the past 2160 hour(s))  Urinalysis, Routine w reflex microscopic-may I&O cath if menses (not at [facility])     Status: Abnormal   Collection Time: 01/29/16  6:19 PM  Result Value Ref Range   Color, Urine YELLOW YELLOW   APPearance CLEAR CLEAR   Specific Gravity, Urine 1.017 1.005 - 1.030   pH 5.5 5.0 - 8.0   Glucose, UA NEGATIVE NEGATIVE mg/dL   Hgb urine dipstick SMALL (A) NEGATIVE   Bilirubin Urine NEGATIVE NEGATIVE   Ketones, ur NEGATIVE NEGATIVE mg/dL   Protein, ur NEGATIVE NEGATIVE mg/dL   Nitrite NEGATIVE NEGATIVE   Leukocytes, UA MODERATE (A) NEGATIVE  Urine microscopic-add on     Status: Abnormal   Collection Time: 01/29/16  6:19 PM  Result Value Ref Range   Squamous Epithelial / LPF 6-30 (A) NONE SEEN   WBC, UA 6-30 0 - 5 WBC/hpf   RBC / HPF 0-5 0 - 5 RBC/hpf   Bacteria, UA FEW (A) NONE SEEN  Urine culture     Status: Abnormal   Collection Time: 01/29/16  6:19 PM  Result Value Ref Range   Specimen Description URINE, CLEAN CATCH    Special Requests NONE    Culture >=100,000 COLONIES/mL ENTEROCOCCUS SPECIES (A)    Report Status 02/01/2016 FINAL    Organism ID, Bacteria ENTEROCOCCUS SPECIES (A)       Susceptibility   Enterococcus species - MIC*    AMPICILLIN <=2 SENSITIVE Sensitive     LEVOFLOXACIN >=8 RESISTANT Resistant     NITROFURANTOIN <=16 SENSITIVE Sensitive     VANCOMYCIN 1 SENSITIVE Sensitive     * >=100,000 COLONIES/mL ENTEROCOCCUS SPECIES    Objective: General: Patient is awake, alert, and oriented x 3 and in no acute distress.  Integument: Skin is warm, dry and supple bilateral. Nails are tender, long, thickened and  dystrophic with subungual debris, consistent with  onychomycosis, 1-5 bilateral. No signs of infection. No open lesions or preulcerative lesions present bilateral. Remaining integument unremarkable.  Vasculature:  Dorsalis Pedis pulse 1/4 bilateral. Posterior Tibial pulse  1/4 bilateral.  Capillary fill time <5 sec 1-5 bilateral. No hair growth to the level of the digits. Temperature gradient within normal limits. No varicosities present bilateral. Trace edema present bilateral.   Neurology: The patient has intact sensation measured with a 5.07/10g Semmes Weinstein Monofilament at all pedal sites bilateral. Vibratory sensation diminished bilateral with tuning fork. No Babinski sign present bilateral.   Musculoskeletal: No symptomatic pedal deformities noted bilateral. Muscular strength 5/5 in all lower extremity muscular groups bilateral without pain on range of motion . No tenderness with  calf compression bilateral.  Assessment and Plan: Problem List Items Addressed This Visit    None    Visit Diagnoses    Dermatophytosis of nail    -  Primary    Toe pain, bilateral        Diabetes mellitus without complication ([place])           -Examined patient. -Discussed and educated patient on diabetic foot care, especially with  regards to the vascular, neurological and musculoskeletal systems.  -Stressed the importance of good glycemic control and the detriment of not  controlling glucose levels in relation to the foot. -Mechanically debrided all nails 1-5 bilateral using sterile nail nipper and filed with dremel without incident  -Encouraged lower limb elevation to assist with edema control -Answered all patient questions -Patient to return  in 3 months for at risk foot care -Patient advised to call the office if any problems or questions arise in the meantime.  Landis Martins, DPM

## 2016-04-07 DIAGNOSIS — D3A00 Benign carcinoid tumor of unspecified site: Secondary | ICD-10-CM | POA: Diagnosis not present

## 2016-04-07 DIAGNOSIS — R918 Other nonspecific abnormal finding of lung field: Secondary | ICD-10-CM | POA: Diagnosis not present

## 2016-04-07 DIAGNOSIS — Z7982 Long term (current) use of aspirin: Secondary | ICD-10-CM | POA: Diagnosis not present

## 2016-04-07 DIAGNOSIS — E119 Type 2 diabetes mellitus without complications: Secondary | ICD-10-CM | POA: Diagnosis not present

## 2016-04-07 DIAGNOSIS — I10 Essential (primary) hypertension: Secondary | ICD-10-CM | POA: Diagnosis not present

## 2016-04-07 DIAGNOSIS — E785 Hyperlipidemia, unspecified: Secondary | ICD-10-CM | POA: Diagnosis not present

## 2016-04-07 DIAGNOSIS — J45991 Cough variant asthma: Secondary | ICD-10-CM | POA: Diagnosis not present

## 2016-04-07 DIAGNOSIS — K219 Gastro-esophageal reflux disease without esophagitis: Secondary | ICD-10-CM | POA: Diagnosis not present

## 2016-04-12 ENCOUNTER — Telehealth: Payer: Self-pay | Admitting: *Deleted

## 2016-04-12 NOTE — Telephone Encounter (Signed)
Asked the patient to call me so we can discuss her questions about the compression stocking she recently bought rather than coming in for a Kellie Simmering MD appt.

## 2016-04-13 ENCOUNTER — Ambulatory Visit: Payer: Medicare Other | Admitting: Physical Therapy

## 2016-04-13 ENCOUNTER — Ambulatory Visit (INDEPENDENT_AMBULATORY_CARE_PROVIDER_SITE_OTHER): Payer: Medicare Other | Admitting: Vascular Surgery

## 2016-04-13 ENCOUNTER — Encounter: Payer: Self-pay | Admitting: Vascular Surgery

## 2016-04-13 VITALS — BP 132/70 | HR 89 | Temp 98.2°F | Resp 18 | Ht 60.0 in | Wt 170.0 lb

## 2016-04-13 DIAGNOSIS — I83891 Varicose veins of right lower extremities with other complications: Secondary | ICD-10-CM

## 2016-04-13 NOTE — Progress Notes (Signed)
Subjective:     Patient ID: [name], female   DOB: Feb 26, 1932, 82 y.o.   MRN: [id number]  HPI this 82 year old female is accompanied by her daughter. The patient is [place] and her daughter speaks [place] but the patient does not. She has been seen by me in March and May of this year with stasis ulcer right leg. She was seen in the wound center. The ulcer has healed. She had gross reflux in the right great saphenous vein and I recommended ablation which they refused. She is wearing compression stockings. We prescribed 20-30 millimeter gradient stockings. She has a pamphlet from the wound center that states 3040 mm stockings. She was to talk about compression and which is best for her mother. The ulcer continues to be healed.   Review of Systems     Objective:   Physical Exam BP 132/70 mmHg  Pulse 89  Temp(Src) 98.2 F (36.8 C)  Resp 18  Ht 5' (1.524 m)  Wt 170 lb (77.111 kg)  BMI 33.20 kg/m2  SpO2 99% Gen. patient is alert and oriented 3 in no apparent distress Lungs no rhonchi or wheezing Stockings are in place in both lower extremities     Assessment:     I discussed with the patient's daughter the fact that 2030 stockings work better for some patients and 3040 stockings work Advertising copywriter for others. Either that the patient is able to tolerate and prevents recurrence of the ulcer is quite fine. Her questions were answered.    Plan:     She will return to see [place] on when necessary basis and continue elastic compression treatment at home and follow-up in the wound center.

## 2016-04-19 ENCOUNTER — Encounter: Payer: Self-pay | Admitting: Physical Therapy

## 2016-04-19 ENCOUNTER — Ambulatory Visit: Payer: Medicare Other | Attending: Physical Medicine & Rehabilitation | Admitting: Physical Therapy

## 2016-04-19 DIAGNOSIS — M25551 Pain in right hip: Secondary | ICD-10-CM | POA: Insufficient documentation

## 2016-04-19 DIAGNOSIS — R262 Difficulty in walking, not elsewhere classified: Secondary | ICD-10-CM | POA: Diagnosis not present

## 2016-04-19 DIAGNOSIS — M6281 Muscle weakness (generalized): Secondary | ICD-10-CM | POA: Diagnosis not present

## 2016-04-19 NOTE — Therapy (Signed)
[facility] [place], [place], [address] Phone: [phone]   Fax:  [phone]  Physical Therapy Evaluation  Patient Details  Name: [name] MRN: [id number] Date of Birth: [birth year] Referring Provider: Venora Maples  Encounter Date: 04/19/2016      PT End of Session - 04/19/16 1514    Visit Number 1   Date for PT Re-Evaluation 06/19/16   PT Start Time 5885   PT Stop Time 1515   PT Time Calculation (min) 47 min   Activity Tolerance Patient tolerated treatment well   Behavior During Therapy [facility] for tasks assessed/performed      Past Medical History  Diagnosis Date  . Type II diabetes mellitus ([place])   . Essential hypertension   . Coronary artery disease     a. Ca2+ noted on CT - no other clear documentation of CAD.  Marland Kitchen Moderate aortic insufficiency     a. 10/2015 Echo: EF 50-55%, no rwma, mod AI, mod MS, mild MR, sev dil LA, mild TR/PR, PASP 53mHg.  .Marland KitchenHyperlipidemia   . Ascending aortic aneurysm ([employer] [employer]     a. 08/2015 stable 5 cm Asc Ao Aneurysm.  .Marland KitchenHypercholesterolemia   . COPD (chronic obstructive pulmonary disease) ([place]   . Bronchitis   . Edema   . GERD (gastroesophageal reflux disease)   . Pain in joint, forearm   . Pain in joint, hand   . Pain in joint, lower leg   . Difficulty in walking(719.7)   . Disturbance of skin sensation   . Carcinoid tumor   . Mitral valve disease     a. 10/2015 Echo: Mod MS, mild MR.  . Pulmonary nodules     s. 08/2015 CT: stable lung nodules.    Past Surgical History  Procedure Laterality Date  . Pars plana vitrectomy      right eye  . Posterior capsulectomy      right eye  . Pars plana vitrectomy w/ endophotocoagulation      right eye  . Fiberoptic bronch with endobronchial u/s  07/02/2010  . Video bronchoscopy  12/09/2009  . 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 Bronch with endobronchial biopies  09/15/2009   . Colonoscopy N/A 09/13/2013    Procedure: COLONOSCOPY;  Surgeon: MJeryl Columbia MD;  Location: [facility];  Service: Endoscopy;  Laterality: N/A;    There were no vitals filed for this visit.       Subjective Assessment - 04/19/16 1435    Subjective Patient reports that she has right hip and back pain for aobut a year, reports that she had a wound on her right lower leg and that she was limping more and that may have caused her hip and back pain   Limitations Walking;Standing   Patient Stated Goals walk with less pain   Currently in Pain? Yes   Pain Score 5    Pain Location Hip   Pain Orientation Right;Anterior   Pain Descriptors / Indicators Constant;Aching   Pain Type Chronic pain   Pain Onset More than a month ago   Pain Frequency Constant   Aggravating Factors  walking and standing, pain up to 9/10   Pain Relieving Factors rest helps but at best pain is a 5/10   Effect of Pain on Daily Activities limits walking and standing            [facility] PT Assessment - 04/19/16 0001  Assessment   Medical Diagnosis right hip pain   Referring Provider Campos   Onset Date/Surgical Date 03/19/16   Prior Therapy 2 years ago for back   Balance Screen   Has the patient fallen in the past 6 months No   Has the patient had a decrease in activity level because of a fear of falling?  No   Is the patient reluctant to leave their home because of a fear of falling?  No   Home Environment   Additional Comments lives with daughter and spouse, she does some light cooking and cleaning   Prior Function   Level of Independence Independent;Independent with household mobility with device  using walker the past year   Vocation Retired   Leisure no exercise   ROM / Strength   AROM / PROM / Strength AROM;Strength   AROM   Overall AROM Comments right hip AROM flexion 10 degrees, abduction 10 degrees all cause increase of right hip pain   Strength   Overall Strength Comments 2+/5   Palpation    Palpation comment some tightness and tenderness in the low back the right latera and anterior hip   Ambulation/Gait   Gait Comments Uses a 4 wheeled walker, slow, significant antalgic gait on the right, toe turns inward, decreased right hip extension.   Standardized Balance Assessment   Standardized Balance Assessment Timed Up and Go Test   Timed Up and Go Test   Normal TUG (seconds) 32                   [facility] Adult PT Treatment/Exercise - 04/19/16 0001    Exercises   Exercises Knee/Hip   Knee/Hip Exercises: Aerobic   Stationary Bike x 4 minutes   Nustep level 4 x 5 minutes   Knee/Hip Exercises: Machines for Strengthening   Cybex Knee Extension unable becuase of leg wound history, she was afraid   Knee/Hip Exercises: Seated   Long Arc Quad 2 sets;10 reps   Marching 2 sets;10 reps                     PT Long Term Goals - 04/19/16 1518    PT LONG TERM GOAL #1   Title independent with HEP   Time 8   Period Weeks   Status New   PT LONG TERM GOAL #2   Title decrease pain 50%   Time 8   Period Weeks   Status New   PT LONG TERM GOAL #3   Title decrease TUG time to 22 seconds   Time 8   Period Weeks   Status New   PT LONG TERM GOAL #4   Title increase right hip strength to 4/5   Time 8   Period Weeks   Status New   PT LONG TERM GOAL #5   Title increase right hip flexion to 60 degress   Time 8   Period Weeks   Status New               Plan - 04/19/16 1515    Clinical Impression Statement Patient with right hip and back pain, she reports that she has had a wound on the right leg for over a year and really has not been able to do anything, she reports having diffiiculty walking now and being deconditioned.  She is fearful of any touch on the right shin area, since this is where the wound was.  Her timed up and go test  was 32 seconds, she has a shortened stance phase on the right   Rehab Potential Good   PT Frequency 2x / week   PT  Duration 8 weeks   PT Treatment/Interventions ADLs/Self Care Home Management;Electrical Stimulation;Moist Heat;Gait training;Functional mobility training;Therapeutic activities;Therapeutic exercise;Manual techniques;Patient/family education   PT Next Visit Plan slowly add exercises as tolerated   Consulted and Agree with Plan of Care Patient      Patient will benefit from skilled therapeutic intervention in order to improve the following deficits and impairments:  Abnormal gait, Cardiopulmonary status limiting activity, Decreased activity tolerance, Decreased mobility, Decreased range of motion, Decreased strength, Difficulty walking, Impaired flexibility, Postural dysfunction, Pain  Visit Diagnosis: Pain in right hip - Plan: PT plan of care cert/re-cert  Difficulty in walking, not elsewhere classified - Plan: PT plan of care cert/re-cert  Muscle weakness (generalized) - Plan: PT plan of care cert/re-cert      G-Codes - 27/78/24 1520    Functional Assessment Tool Used foto 66% limitation   Functional Limitation Mobility: Walking and moving around   Mobility: Walking and Moving Around Current Status [phone]) At least 60 percent but less than 80 percent impaired, limited or restricted   Mobility: Walking and Moving Around Goal Status [phone]) At least 40 percent but less than 60 percent impaired, limited or restricted       Problem List Patient Active Problem List   Diagnosis Date Noted  . Venous ulcer of right leg ([place]) 03/16/2016  . Type II diabetes mellitus ([place])   . Ascending aortic aneurysm ([place])   . Primary osteoarthritis of both hands 01/23/2016  . Varicose veins of right lower extremity with complications 54/00/8676  . Open wound of lower leg 08/26/2015  . Venous ulcer ([place]) 08/26/2015  . Lymphedema of leg 07/23/2015  . Varicose veins of lower extremities with ulcer and inflammation ([place]) 07/23/2015  . Chronic venous insufficiency 07/23/2015  . Leg ulcer ([place]) 07/16/2015   . Ulcer of right lower leg ([place]) 07/12/2015  . Cellulitis of right leg 07/12/2015  . Hyponatremia 07/12/2015  . Chronic diastolic heart failure ([place]) 07/12/2015  . Right hip pain 01/07/2015  . Primary osteoarthritis of right hip 01/07/2015  . Hip contracture 01/07/2015  . AI (aortic incompetence) 08/07/2014  . Diastolic murmur 19/50/9326  . Asthma, mild intermittent 04/04/2013  . Leg varices 03/09/2013  . Asthma, cough variant 10/04/2012  . Eczema intertrigo 05/11/2012  . Basal cell papilloma 05/11/2012  . Carcinoid tumor 10/20/2011  . Allergic rhinitis 07/23/2011  . Airway hyperreactivity 07/23/2011  . Chronic cough 07/23/2011  . Diabetes mellitus type 2 with peripheral artery disease ([place]) 06/19/2009  . HYPERCHOLESTEROLEMIA 06/19/2009  . HYPERLIPIDEMIA 06/19/2009  . Essential hypertension 06/19/2009  . Coronary atherosclerosis 06/19/2009  . Aortic valve regurgitation 06/19/2009  . AORTIC ANEURYSM 06/19/2009  . BRONCHITIS 06/19/2009  . COPD (chronic obstructive pulmonary disease) ([place]) 06/19/2009  . GERD 06/19/2009  . EDEMA 06/19/2009  . 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

## 2016-04-20 ENCOUNTER — Encounter (INDEPENDENT_AMBULATORY_CARE_PROVIDER_SITE_OTHER): Payer: Medicare Other

## 2016-04-20 DIAGNOSIS — I868 Varicose veins of other specified sites: Secondary | ICD-10-CM

## 2016-04-22 DIAGNOSIS — M25551 Pain in right hip: Secondary | ICD-10-CM | POA: Diagnosis not present

## 2016-04-22 DIAGNOSIS — M1611 Unilateral primary osteoarthritis, right hip: Secondary | ICD-10-CM | POA: Diagnosis not present

## 2016-04-23 NOTE — Progress Notes (Signed)
Office Visit    Patient Name: [name] Date of Encounter: 04/23/2016  Primary Care Provider:  Apolonio Schneiders, MD Primary Cardiologist:  P. Johnsie Cancel, MD   Chief Complaint    82 year old female with history of hypertension, mitral and aortic valvular disease, and ascending aortic aneurysm, who presents for follow-up.  Past Medical History    Past Medical History  Diagnosis Date  . Type II diabetes mellitus ([place])   . Essential hypertension   . Coronary artery disease     a. Ca2+ noted on CT - no other clear documentation of CAD.  Marland Kitchen Moderate aortic insufficiency     a. 10/2015 Echo: EF 50-55%, no rwma, mod AI, mod MS, mild MR, sev dil LA, mild TR/PR, PASP 35mHg.  .Marland KitchenHyperlipidemia   . Ascending aortic aneurysm ([place]     a. 08/2015 stable 5 cm Asc Ao Aneurysm.  .Marland KitchenHypercholesterolemia   . COPD (chronic obstructive pulmonary disease) ([place]   . Bronchitis   . Edema   . GERD (gastroesophageal reflux disease)   . Pain in joint, forearm   . Pain in joint, hand   . Pain in joint, lower leg   . Difficulty in walking(719.7)   . Disturbance of skin sensation   . Carcinoid tumor   . Mitral valve disease     a. 10/2015 Echo: Mod MS, mild MR.  . Pulmonary nodules     s. 08/2015 CT: stable lung nodules.   Past Surgical History  Procedure Laterality Date  . Pars plana vitrectomy      right eye  . Posterior capsulectomy      right eye  . Pars plana vitrectomy w/ endophotocoagulation      right eye  . Fiberoptic bronch with endobronchial u/s  07/02/2010  . Video bronchoscopy  12/09/2009  . 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 Bronch with endobronchial biopies  09/15/2009  . Colonoscopy N/A 09/13/2013    Procedure: COLONOSCOPY;  Surgeon: MJeryl Columbia MD;  [facility];  Service: Endoscopy;  Laterality: N/A;    Allergies  No Known Allergies  History of Present Illness    82year old female with the above complex  past medical history. She has an ascending aortic aneurysm that has been stable at 5 cm for some time, and is followed closely by thoracic surgery. She also has aortic and mitral valve disease. She is not felt to be a candidate for surgery and thus has been conservatively managed over the years. She also has a history of labile hypertension and has been on a variety of medication regimens over the years. Her daughter is with her today and assists in translation and drives the conversation. Her daughter says that the patient's blood pressure has been variable at home, sometimes in the 160s to 170s in the morning. She's been compliant with her medications although she has only been taking hydralazine twice a day instead of 3 times a day. The patient offers no complaints and denies chest pain or dyspnea. There is no history of PND, orthopnea, dizziness, syncope, or early satiety. She does have chronic right greater than left lower extremity edema and is being evaluated in vein clinic for laser ablation of the right great saphenous vein to facilitate wound healing on that side and also help prevent future stasis ulcers.  Home Medications    Prior to Admission medications   Medication Sig Start Date End Date Taking? Authorizing Provider  albuterol (PROVENTIL HFA;VENTOLIN HFA) 108 (90 Base) MCG/ACT inhaler Inhale 2 puffs into the lungs every 6 (six) hours as needed for wheezing or shortness of breath.   Yes Historical Provider, MD  amLODipine (NORVASC) 10 MG tablet TAKE 1 TABLET (10 MG TOTAL) BY MOUTH DAILY. 01/27/16  Yes Josue Hector, MD  aspirin EC 81 MG tablet Take 81 mg by mouth every evening. 7 pm   Yes Historical Provider, MD  atorvastatin (LIPITOR) 10 MG tablet Take 10 mg by mouth at bedtime. 06/17/15  Yes Historical Provider, MD  BENICAR 40 MG tablet TAKE 1 TABLET (40 MG TOTAL) BY MOUTH 2 (TWO) TIMES DAILY. 10/21/15  Yes Josue Hector, MD  FeFum-FePoly-FA-B Cmp-C-Biot (INTEGRA PLUS) CAPS TAKE ONE  CAPSULE BY MOUTH EVERY DAY *NOT COVERED* 12/22/15  Yes Curt Bears, MD  fluticasone (FLOVENT HFA) 110 MCG/ACT inhaler Inhale 2 puffs into the lungs 2 (two) times daily.   Yes Historical Provider, MD  furosemide (LASIX) 20 MG tablet Take 1 tablet (20 mg total) by mouth daily as needed for fluid or edema. 08/29/15  Yes Josue Hector, MD  glimepiride (AMARYL) 1 MG tablet Take 1 mg by mouth daily.    Yes Historical Provider, MD  hydrALAZINE (APRESOLINE) 50 MG tablet Take 50 mg by mouth 3 (three) times daily.    Yes Historical Provider, MD  montelukast (SINGULAIR) 10 MG tablet Take 10 mg by mouth at bedtime. 06/04/15  Yes Historical Provider, MD  Multiple Vitamin (MULITIVITAMIN WITH MINERALS) TABS Take 1 tablet by mouth daily.   Yes Historical Provider, MD  nitroGLYCERIN (NITROSTAT) 0.4 MG SL tablet Place 0.4 mg under the tongue every 5 (five) minutes as needed for chest pain (3 DOSES MAX).   Yes Historical Provider, MD  Omega-3 Fatty Acids (FISH OIL) 1200 MG CAPS Take 1 capsule by mouth daily.   Yes Historical Provider, MD  omeprazole (PRILOSEC) 20 MG capsule Take 20 mg by mouth daily before breakfast. 06/04/15  Yes Historical Provider, MD  pioglitazone (ACTOS) 15 MG tablet Take 7.5 mg by mouth daily.    Yes Historical Provider, MD  potassium chloride (K-DUR,KLOR-CON) 10 MEQ tablet Take 20 mEq by mouth daily.   Yes Historical Provider, MD  Probiotic Product (PROBIOTIC PO) Take 1 tablet by mouth daily.   Yes Historical Provider, MD  Spacer/Aero-Holding Chambers (AEROCHAMBER MINI CHAMBER) DEVI use as directed with your inhalers  04/27/11  Yes Historical Provider, MD    Review of Systems     as above, patient has been doing recently well. She has chronic right lower extremity edema and is followed in wound clinic with improved healing. No recent history of chest pain, dyspnea, PND, orthopnea or dizziness, syncope, or early satiety.  All other systems reviewed and are otherwise negative except as noted  above.  Physical Exam    VS:  There were no vitals taken for this visit. , BMI There is no weight on file to calculate BMI. GEN: Well nourished, well developed, in no acute distress. HEENT: normal. Neck: Supple, no JVD, carotid bruits, or masses. Cardiac: RRR, 3/6 SEM  And Diast murmur loudest @ upper sternal border but heard throughout.  No rubs, or gallops. No clubbing, cyanosis, edema.  Radials/DP/PT 2+ and equal bilaterally.  Respiratory:  Respirations regular and unlabored, clear to auscultation bilaterally. GI: Soft, nontender, nondistended, BS + x 4. MS: no deformity or atrophy. Skin: warm and dry, no rash. Neuro:  Strength and sensation are intact. Psych: Normal affect.  Accessory  Clinical Findings     regular sinus rhythm, 66,  Left axis deviation , left anterior fascicular block ,first-degree AV block, PACs , PVC, poor R progression -no acute ST or T changes.  Assessment & Plan    1.   Essential hypertension:  Labile    2.  Ascending aortic aneurysm: stable at 5 cm based on CT in November 2016. She is not felt to be a surgical candidate. She is being managed conservatively by Dr. Roxan Hockey. As her blood pressure has been higher at home, we are reinforcing that she take her hydralazine as prescribed-3 times a day.   3. Mitral and aortic valve disease: conservatively managed in the setting of poor surgical candidacy.   4. Disposition: patient will follow-up with me in 6 months    Jenkins Rouge    This encounter was created in error - please disregard.

## 2016-04-26 ENCOUNTER — Encounter: Payer: Medicare Other | Admitting: Cardiovascular Disease

## 2016-04-26 DIAGNOSIS — R0989 Other specified symptoms and signs involving the circulatory and respiratory systems: Secondary | ICD-10-CM

## 2016-05-04 ENCOUNTER — Ambulatory Visit: Payer: Medicare Other | Admitting: Physical Therapy

## 2016-05-05 ENCOUNTER — Encounter: Payer: Self-pay | Admitting: Cardiovascular Disease

## 2016-05-12 ENCOUNTER — Encounter: Payer: Self-pay | Admitting: Cardiovascular Disease

## 2016-05-17 ENCOUNTER — Ambulatory Visit: Payer: Medicare Other | Attending: Physical Medicine & Rehabilitation | Admitting: Physical Therapy

## 2016-05-17 ENCOUNTER — Encounter: Payer: Self-pay | Admitting: Physical Therapy

## 2016-05-17 DIAGNOSIS — M6281 Muscle weakness (generalized): Secondary | ICD-10-CM | POA: Insufficient documentation

## 2016-05-17 DIAGNOSIS — R262 Difficulty in walking, not elsewhere classified: Secondary | ICD-10-CM | POA: Diagnosis not present

## 2016-05-17 DIAGNOSIS — M25551 Pain in right hip: Secondary | ICD-10-CM | POA: Diagnosis not present

## 2016-05-17 NOTE — Therapy (Signed)
[place], [place], [address] Phone: [phone]   Fax:  [phone]  Physical Therapy Treatment  Patient Details  Name: [name] MRN: [id number] Date of Birth: 30-Jul-1932 Referring Provider: Venora Maples  Encounter Date: 05/17/2016      PT End of Session - 05/17/16 1518    Visit Number 2   Date for PT Re-Evaluation 06/19/16   PT Start Time 1443   PT Stop Time 1537   PT Time Calculation (min) 54 min   Activity Tolerance Patient tolerated treatment well   Behavior During Therapy [facility] for tasks assessed/performed      Past Medical History:  Diagnosis Date  . Ascending aortic aneurysm ([place])    a. 08/2015 stable 5 cm Asc Ao Aneurysm.  . Bronchitis   . Carcinoid tumor   . COPD (chronic obstructive pulmonary disease) ([place])   . Coronary artery disease    a. Ca2+ noted on CT - no other clear documentation of CAD.  Marland Kitchen Difficulty in walking(719.7)   . Disturbance of skin sensation   . Edema   . Essential hypertension   . GERD (gastroesophageal reflux disease)   . Hypercholesterolemia   . Hyperlipidemia   . Mitral valve disease    a. 10/2015 Echo: Mod MS, mild MR.  . Moderate aortic insufficiency    a. 10/2015 Echo: EF 50-55%, no rwma, mod AI, mod MS, mild MR, sev dil LA, mild TR/PR, PASP 61mHg.  . Pain in joint, forearm   . Pain in joint, hand   . Pain in joint, lower leg   . Pulmonary nodules    s. 08/2015 CT: stable lung nodules.  . Type II diabetes mellitus ([place]     Past Surgical History:  Procedure Laterality Date  . bronch with endobronchial biopies  09/15/2009  . COLONOSCOPY N/A 09/13/2013   Procedure: COLONOSCOPY;  Surgeon: MJeryl Columbia MD;  [facility];  Service: Endoscopy;  Laterality: N/A;  . endobronchial excision of right upper lobe tumor with laser bronchi  10/06/2009  . fiberoptic bronch with endobronchial u/s  07/02/2010  . PARS PLANA VITRECTOMY     right eye   . PARS PLANA VITRECTOMY W/ ENDOPHOTOCOAGULATION     right eye  . posterior capsulectomy     right eye  . VIDEO BRONCHOSCOPY  12/09/2009    There were no vitals filed for this visit.      Subjective Assessment - 05/17/16 1445    Subjective No changes, still hurting, "hope the shot a month ago would help".   Currently in Pain? Yes   Pain Score 6    Pain Location Hip   Pain Orientation Right;Anterior                         [facility] Adult PT Treatment/Exercise - 05/17/16 0001      Ambulation/Gait   Gait Comments gait with her cues to decrease internal hip rotation on the right, has significant right antalgic and pain x 150 feet     Knee/Hip Exercises: Aerobic   Stationary Bike x 4 minutes   Nustep level 4 x 5 minutes     Knee/Hip Exercises: Seated   Long Arc Quad 2 sets;10 reps   Long Arc Quad Weight 3 lbs.   Ball Squeeze 30 reps   Marching 2 sets;10 reps   Marching Weights 3 lbs.   Hamstring Curl 2 sets;10 reps  Hamstring Limitations tband yellow, caused bilatreral cramps int he HS     Knee/Hip Exercises: Supine   Bridges with Ball Squeeze 2 sets;10 reps  cramps in HS   Other Supine Knee/Hip Exercises feet on ball K2C, trunk rotation, small bridges and isometric abdominals     Modalities   Modalities Iontophoresis;Electrical Stimulation;Moist Heat     Moist Heat Therapy   Number Minutes Moist Heat 15 Minutes   Moist Heat Location Hip     Electrical Stimulation   Electrical Stimulation Location right lateral hip    Electrical Stimulation Action IFC   Electrical Stimulation Parameters sitting   Electrical Stimulation Goals Pain     Iontophoresis   Type of Iontophoresis Dexamethasone   Location right lateral hip   Dose 70m   Time 4 hour patch                     PT Long Term Goals - 05/17/16 1526      PT LONG TERM GOAL #1   Title independent with HEP   Status Achieved               Plan - 05/17/16 1518     Clinical Impression Statement Patient with right hip pain and the old wound on the right lower leg that limits her ability, she had a lot of cramps with many of the activities   PT Treatment/Interventions ADLs/Self Care Home Management;Electrical Stimulation;Moist Heat;Gait training;Functional mobility training;Therapeutic activities;Therapeutic exercise;Manual techniques;Patient/family education   PT Next Visit Plan slowly add exercises as tolerated, see if ionto helps   Consulted and Agree with Plan of Care Patient      Patient will benefit from skilled therapeutic intervention in order to improve the following deficits and impairments:  Abnormal gait, Cardiopulmonary status limiting activity, Decreased activity tolerance, Decreased mobility, Decreased range of motion, Decreased strength, Difficulty walking, Impaired flexibility, Postural dysfunction, Pain  Visit Diagnosis: Pain in right hip  Difficulty in walking, not elsewhere classified  Muscle weakness (generalized)     Problem List Patient Active Problem List   Diagnosis Date Noted  . Venous ulcer of right leg ([place] 03/16/2016  . Type II diabetes mellitus ([place]   . Ascending aortic aneurysm ([place]   . Primary osteoarthritis of both hands 01/23/2016  . Varicose veins of right lower extremity with complications 056/43/3295 . Open wound of lower leg 08/26/2015  . Venous ulcer ([place] 08/26/2015  . Lymphedema of leg 07/23/2015  . Varicose veins of lower extremities with ulcer and inflammation ([facility] 07/23/2015  . Chronic venous insufficiency 07/23/2015  . Leg ulcer ([facility] 07/16/2015  . Ulcer of right lower leg ([place] 07/12/2015  . Cellulitis of right leg 07/12/2015  . Hyponatremia 07/12/2015  . Chronic diastolic heart failure ([place] 07/12/2015  . Right hip pain 01/07/2015  . Primary osteoarthritis of right hip 01/07/2015  . Hip contracture 01/07/2015  . AI (aortic incompetence) 08/07/2014  . Diastolic murmur 118/84/1660 .  Asthma, mild intermittent 04/04/2013  . Leg varices 03/09/2013  . Asthma, cough variant 10/04/2012  . Eczema intertrigo 05/11/2012  . Basal cell papilloma 05/11/2012  . Carcinoid tumor 10/20/2011  . Allergic rhinitis 07/23/2011  . Airway hyperreactivity 07/23/2011  . Chronic cough 07/23/2011  . Diabetes mellitus type 2 with peripheral artery disease ([place] 06/19/2009  . HYPERCHOLESTEROLEMIA 06/19/2009  . HYPERLIPIDEMIA 06/19/2009  . Essential hypertension 06/19/2009  . Coronary atherosclerosis 06/19/2009  . Aortic valve regurgitation 06/19/2009  . AORTIC ANEURYSM 06/19/2009  . BRONCHITIS  06/19/2009  . COPD (chronic obstructive pulmonary disease) ([place]) 06/19/2009  . GERD 06/19/2009  . EDEMA 06/19/2009  . 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, [place], [address] Phone: [phone]   Fax:  [phone]  Name: [name] MRN: [id number] Date of Birth: [birth year]

## 2016-05-24 ENCOUNTER — Ambulatory Visit: Payer: Medicare Other | Admitting: Physical Therapy

## 2016-05-24 ENCOUNTER — Encounter: Payer: Self-pay | Admitting: Physical Therapy

## 2016-05-24 DIAGNOSIS — R262 Difficulty in walking, not elsewhere classified: Secondary | ICD-10-CM | POA: Diagnosis not present

## 2016-05-24 DIAGNOSIS — M6281 Muscle weakness (generalized): Secondary | ICD-10-CM

## 2016-05-24 DIAGNOSIS — M25551 Pain in right hip: Secondary | ICD-10-CM | POA: Diagnosis not present

## 2016-05-24 NOTE — Therapy (Signed)
[place], [place], [address] Phone: [phone]   Fax:  [phone]  Physical Therapy Treatment  Patient Details  Name: [name] MRN: [id number] Date of Birth: 06/04/1932 Referring Provider: Venora Maples  Encounter Date: 05/24/2016      PT End of Session - 05/24/16 1433    Visit Number 3   Date for PT Re-Evaluation 06/19/16   PT Start Time 1345   PT Stop Time 1445   PT Time Calculation (min) 60 min   Activity Tolerance Patient tolerated treatment well   Behavior During Therapy [facility] for tasks assessed/performed      Past Medical History:  Diagnosis Date  . Ascending aortic aneurysm ([place])    a. 08/2015 stable 5 cm Asc Ao Aneurysm.  . Bronchitis   . Carcinoid tumor   . COPD (chronic obstructive pulmonary disease) ([place])   . Coronary artery disease    a. Ca2+ noted on CT - no other clear documentation of CAD.  Marland Kitchen Difficulty in walking(719.7)   . Disturbance of skin sensation   . Edema   . Essential hypertension   . GERD (gastroesophageal reflux disease)   . Hypercholesterolemia   . Hyperlipidemia   . Mitral valve disease    a. 10/2015 Echo: Mod MS, mild MR.  . Moderate aortic insufficiency    a. 10/2015 Echo: EF 50-55%, no rwma, mod AI, mod MS, mild MR, sev dil LA, mild TR/PR, PASP 66mHg.  . Pain in joint, forearm   . Pain in joint, hand   . Pain in joint, lower leg   . Pulmonary nodules    s. 08/2015 CT: stable lung nodules.  . Type II diabetes mellitus ([place]     Past Surgical History:  Procedure Laterality Date  . bronch with endobronchial biopies  09/15/2009  . COLONOSCOPY N/A 09/13/2013   Procedure: COLONOSCOPY;  Surgeon: MJeryl Columbia MD;  [facility];  Service: Endoscopy;  Laterality: N/A;  . endobronchial excision of right upper lobe tumor with laser bronchi  10/06/2009  . fiberoptic bronch with endobronchial u/s  07/02/2010  . PARS PLANA VITRECTOMY     right eye   . PARS PLANA VITRECTOMY W/ ENDOPHOTOCOAGULATION     right eye  . posterior capsulectomy     right eye  . VIDEO BRONCHOSCOPY  12/09/2009    There were no vitals filed for this visit.      Subjective Assessment - 05/24/16 1418    Subjective Felt a little better   Currently in Pain? Yes   Pain Score 5    Pain Location Hip   Pain Orientation Right;Anterior                         [facility] Adult PT Treatment/Exercise - 05/24/16 0001      Ambulation/Gait   Gait Comments gait with 4WW, cues to take bigger steps     Knee/Hip Exercises: Stretches   Passive Hamstring Stretch 2 reps;20 seconds   Quad Stretch 2 reps;20 seconds   Piriformis Stretch 2 reps;30 seconds     Knee/Hip Exercises: Aerobic   Stationary Bike x 4 minutes   Nustep level 4 x 5 minutes     Knee/Hip Exercises: Standing   Other Standing Knee Exercises seated 3# biceps and punches     Knee/Hip Exercises: Seated   Long Arc Quad 2 sets;10 reps   Long Arc Quad Weight 3 lbs.  Ball Squeeze 30 reps   Marching 2 sets;10 reps   Marching Weights 3 lbs.   Hamstring Curl 2 sets;10 reps   Hamstring Limitations tband yellow, caused bilatreral cramps int he HS     Electrical Stimulation   Electrical Stimulation Location right lateral hip    Electrical Stimulation Action IFC   Electrical Stimulation Parameters sitting   Electrical Stimulation Goals Pain     Iontophoresis   Type of Iontophoresis Dexamethasone   Location right lateral hip   Dose 49m   Time 4 hour patch                     PT Long Term Goals - 05/24/16 1435      PT LONG TERM GOAL #1   Title independent with HEP   Status Achieved     PT LONG TERM GOAL #2   Title decrease pain 50%   Status Partially Met               Plan - 05/24/16 1434    Clinical Impression Statement Patient continues to report right lateral hip pain, takes very small steps with the right LE   PT Next Visit Plan slowly add exercises as  tolerated, see if ionto helps   Consulted and Agree with Plan of Care Patient      Patient will benefit from skilled therapeutic intervention in order to improve the following deficits and impairments:  Abnormal gait, Cardiopulmonary status limiting activity, Decreased activity tolerance, Decreased mobility, Decreased range of motion, Decreased strength, Difficulty walking, Impaired flexibility, Postural dysfunction, Pain  Visit Diagnosis: Pain in right hip  Difficulty in walking, not elsewhere classified  Muscle weakness (generalized)     Problem List Patient Active Problem List   Diagnosis Date Noted  . Venous ulcer of right leg ([place] 03/16/2016  . Type II diabetes mellitus ([facility]   . Ascending aortic aneurysm (HE[place]   . Primary osteoarthritis of both hands 01/23/2016  . Varicose veins of right lower extremity with complications 016/94/5038 . Open wound of lower leg 08/26/2015  . Venous ulcer (HO[place] 08/26/2015  . Lymphedema of leg 07/23/2015  . Varicose veins of lower extremities with ulcer and inflammation ([place] 07/23/2015  . Chronic venous insufficiency 07/23/2015  . Leg ulcer (H[place] 07/16/2015  . Ulcer of right lower leg ([place] 07/12/2015  . Cellulitis of right leg 07/12/2015  . Hyponatremia 07/12/2015  . Chronic diastolic heart failure (HV[place] 07/12/2015  . Right hip pain 01/07/2015  . Primary osteoarthritis of right hip 01/07/2015  . Hip contracture 01/07/2015  . AI (aortic incompetence) 08/07/2014  . Diastolic murmur 188/28/0034 . Asthma, mild intermittent 04/04/2013  . Leg varices 03/09/2013  . Asthma, cough variant 10/04/2012  . Eczema intertrigo 05/11/2012  . Basal cell papilloma 05/11/2012  . Carcinoid tumor 10/20/2011  . Allergic rhinitis 07/23/2011  . Airway hyperreactivity 07/23/2011  . Chronic cough 07/23/2011  . Diabetes mellitus type 2 with peripheral artery disease ([place] 06/19/2009  . HYPERCHOLESTEROLEMIA 06/19/2009  . HYPERLIPIDEMIA 06/19/2009  .  Essential hypertension 06/19/2009  . Coronary atherosclerosis 06/19/2009  . Aortic valve regurgitation 06/19/2009  . AORTIC ANEURYSM 06/19/2009  . BRONCHITIS 06/19/2009  . COPD (chronic obstructive pulmonary disease) (HL[place] 06/19/2009  . GERD 06/19/2009  . EDEMA 06/19/2009  . 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

## 2016-05-27 ENCOUNTER — Ambulatory Visit: Payer: Medicare Other | Admitting: Physical Therapy

## 2016-06-02 ENCOUNTER — Ambulatory Visit: Payer: Medicare Other | Attending: Physical Medicine & Rehabilitation | Admitting: Physical Therapy

## 2016-06-02 ENCOUNTER — Encounter: Payer: Self-pay | Admitting: Physical Therapy

## 2016-06-02 ENCOUNTER — Other Ambulatory Visit: Payer: Self-pay | Admitting: *Deleted

## 2016-06-02 DIAGNOSIS — M25551 Pain in right hip: Secondary | ICD-10-CM | POA: Diagnosis not present

## 2016-06-02 DIAGNOSIS — M6281 Muscle weakness (generalized): Secondary | ICD-10-CM | POA: Diagnosis not present

## 2016-06-02 DIAGNOSIS — R262 Difficulty in walking, not elsewhere classified: Secondary | ICD-10-CM | POA: Diagnosis not present

## 2016-06-02 NOTE — Therapy (Signed)
[place], [place], [address] Phone: [phone]   Fax:  [phone]  Physical Therapy Treatment  Patient Details  Name: [name] MRN: [id number] Date of Birth: [birth year] Referring Provider: Venora Maples  Encounter Date: 06/02/2016      PT End of Session - 06/02/16 1435    Visit Number 4   Date for PT Re-Evaluation 06/19/16   PT Start Time 1350   PT Stop Time 1445   PT Time Calculation (min) 55 min   Activity Tolerance Patient tolerated treatment well   Behavior During Therapy [facility] for tasks assessed/performed      Past Medical History:  Diagnosis Date  . Ascending aortic aneurysm ([place])    a. 08/2015 stable 5 cm Asc Ao Aneurysm.  . Bronchitis   . Carcinoid tumor   . COPD (chronic obstructive pulmonary disease) ([place])   . Coronary artery disease    a. Ca2+ noted on CT - no other clear documentation of CAD.  Marland Kitchen Difficulty in walking(719.7)   . Disturbance of skin sensation   . Edema   . Essential hypertension   . GERD (gastroesophageal reflux disease)   . Hypercholesterolemia   . Hyperlipidemia   . Mitral valve disease    a. 10/2015 Echo: Mod MS, mild MR.  . Moderate aortic insufficiency    a. 10/2015 Echo: EF 50-55%, no rwma, mod AI, mod MS, mild MR, sev dil LA, mild TR/PR, PASP 50mHg.  . Pain in joint, forearm   . Pain in joint, hand   . Pain in joint, lower leg   . Pulmonary nodules    s. 08/2015 CT: stable lung nodules.  . Type II diabetes mellitus ([place]     Past Surgical History:  Procedure Laterality Date  . bronch with endobronchial biopies  09/15/2009  . COLONOSCOPY N/A 09/13/2013   Procedure: COLONOSCOPY;  Surgeon: MJeryl Columbia MD;  Location: [facility];  Service: Endoscopy;  Laterality: N/A;  . endobronchial excision of right upper lobe tumor with laser bronchi  10/06/2009  . fiberoptic bronch with endobronchial u/s  07/02/2010  . PARS PLANA VITRECTOMY     right eye   . PARS PLANA VITRECTOMY W/ ENDOPHOTOCOAGULATION     right eye  . posterior capsulectomy     right eye  . VIDEO BRONCHOSCOPY  12/09/2009    There were no vitals filed for this visit.      Subjective Assessment - 06/02/16 1406    Subjective A little better, still difficulty with the right leg with walking   Currently in Pain? Yes   Pain Score 4    Pain Location Hip   Pain Orientation Right;Anterior   Pain Descriptors / Indicators Aching   Aggravating Factors  pressure                         [facility] Adult PT Treatment/Exercise - 06/02/16 0001      Ambulation/Gait   Gait Comments gait with 4WW, cues to take bigger steps, tried some side stepping but this caused pain in the right hip     Knee/Hip Exercises: Stretches   Passive Hamstring Stretch 3 reps;20 seconds   Quad Stretch 3 reps;20 seconds   Piriformis Stretch 3 reps;20 seconds     Knee/Hip Exercises: Aerobic   Nustep level 4 x 7 minutes   Other Aerobic UBE x 4 minutes     Knee/Hip Exercises: Standing  Other Standing Knee Exercises seated 3# biceps and punches     Knee/Hip Exercises: Seated   Long Arc Quad 2 sets;10 reps   Long Arc Quad Weight 3 lbs.   Ball Squeeze 30 reps   Marching 2 sets;10 reps   Marching Weights 3 lbs.     Knee/Hip Exercises: Supine   Other Supine Knee/Hip Exercises feet on ball K2C, trunk rotation, small bridges and isometric abdominals     Moist Heat Therapy   Number Minutes Moist Heat 15 Minutes   Moist Heat Location Hip     Electrical Stimulation   Electrical Stimulation Location right lateral hip    Electrical Stimulation Action IFC   Electrical Stimulation Parameters supine   Electrical Stimulation Goals Pain     Iontophoresis   Type of Iontophoresis Dexamethasone   Location right lateral hip   Dose 65m   Time 4 hour patch                     PT Long Term Goals - 06/02/16 1444      PT LONG TERM GOAL #2   Title decrease pain 50%   Status  Partially Met     PT LONG TERM GOAL #3   Title decrease TUG time to 22 seconds   Status On-going     PT LONG TERM GOAL #5   Title increase right hip flexion to 60 degress   Status On-going               Plan - 06/02/16 1436    Clinical Impression Statement Pain in the right hip was a little more posterior today.  She was able to tolerate the exercises with some modification, weight bearing on the leg really does increase the pain   PT Next Visit Plan continue to add exercises   Consulted and Agree with Plan of Care Patient      Patient will benefit from skilled therapeutic intervention in order to improve the following deficits and impairments:  Abnormal gait, Cardiopulmonary status limiting activity, Decreased activity tolerance, Decreased mobility, Decreased range of motion, Decreased strength, Difficulty walking, Impaired flexibility, Postural dysfunction, Pain  Visit Diagnosis: Pain in right hip  Difficulty in walking, not elsewhere classified  Muscle weakness (generalized)     Problem List Patient Active Problem List   Diagnosis Date Noted  . Venous ulcer of right leg ([place] 03/16/2016  . Type II diabetes mellitus (HM[place]   . Ascending aortic aneurysm (H[place]   . Primary osteoarthritis of both hands 01/23/2016  . Varicose veins of right lower extremity with complications 094/70/9628 . Open wound of lower leg 08/26/2015  . Venous ulcer (HRising [place] 08/26/2015  . Lymphedema of leg 07/23/2015  . Varicose veins of lower extremities with ulcer and inflammation (HM[place] 07/23/2015  . Chronic venous insufficiency 07/23/2015  . Leg ulcer (HMortons [place] 07/16/2015  . Ulcer of right lower leg (HA[place] 07/12/2015  . Cellulitis of right leg 07/12/2015  . Hyponatremia 07/12/2015  . Chronic diastolic heart failure (H[place] 07/12/2015  . Right hip pain 01/07/2015  . Primary osteoarthritis of right hip 01/07/2015  . Hip contracture 01/07/2015  . AI (aortic incompetence) 08/07/2014  . Diastolic  murmur 136/62/9476 . Asthma, mild intermittent 04/04/2013  . Leg varices 03/09/2013  . Asthma, cough variant 10/04/2012  . Eczema intertrigo 05/11/2012  . Basal cell papilloma 05/11/2012  . Carcinoid tumor 10/20/2011  . Allergic rhinitis 07/23/2011  . Airway hyperreactivity 07/23/2011  . Chronic cough 07/23/2011  .  Diabetes mellitus type 2 with peripheral artery disease ([place]) 06/19/2009  . HYPERCHOLESTEROLEMIA 06/19/2009  . HYPERLIPIDEMIA 06/19/2009  . Essential hypertension 06/19/2009  . Coronary atherosclerosis 06/19/2009  . Aortic valve regurgitation 06/19/2009  . AORTIC ANEURYSM 06/19/2009  . BRONCHITIS 06/19/2009  . COPD (chronic obstructive pulmonary disease) ([place]) 06/19/2009  . GERD 06/19/2009  . EDEMA 06/19/2009  . 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

## 2016-06-04 ENCOUNTER — Telehealth: Payer: Self-pay

## 2016-06-04 MED ORDER — TRAMADOL HCL 50 MG PO TABS: 50.0000 mg | ORAL_TABLET | Freq: Four times a day (QID) | ORAL | 1 refills | Status: DC | PRN

## 2016-06-04 NOTE — Telephone Encounter (Signed)
Pt is needing a refill on her Tramadol 50 mg. Tramadol sent to pharmacy.

## 2016-06-07 ENCOUNTER — Encounter: Payer: Self-pay | Admitting: Physical Therapy

## 2016-06-07 ENCOUNTER — Ambulatory Visit: Payer: Medicare Other | Admitting: Physical Therapy

## 2016-06-07 DIAGNOSIS — M6281 Muscle weakness (generalized): Secondary | ICD-10-CM | POA: Diagnosis not present

## 2016-06-07 DIAGNOSIS — R262 Difficulty in walking, not elsewhere classified: Secondary | ICD-10-CM

## 2016-06-07 DIAGNOSIS — M25551 Pain in right hip: Secondary | ICD-10-CM | POA: Diagnosis not present

## 2016-06-07 NOTE — Therapy (Signed)
[place], [place], [address] Phone: [phone]   Fax:  [phone]  Physical Therapy Treatment  Patient Details  Name: [name] MRN: [id number] Date of Birth: 16-Apr-1932 Referring Provider: Venora Maples  Encounter Date: 06/07/2016      PT End of Session - 06/07/16 1520    Visit Number 5   Date for PT Re-Evaluation 06/19/16   PT Start Time 2831   PT Stop Time 1545   PT Time Calculation (min) 64 min   Activity Tolerance Patient tolerated treatment well   Behavior During Therapy [facility] for tasks assessed/performed      Past Medical History:  Diagnosis Date  . Ascending aortic aneurysm ([place])    a. 08/2015 stable 5 cm Asc Ao Aneurysm.  . Bronchitis   . Carcinoid tumor   . COPD (chronic obstructive pulmonary disease) ([place])   . Coronary artery disease    a. Ca2+ noted on CT - no other clear documentation of CAD.  Marland Kitchen Difficulty in walking(719.7)   . Disturbance of skin sensation   . Edema   . Essential hypertension   . GERD (gastroesophageal reflux disease)   . Hypercholesterolemia   . Hyperlipidemia   . Mitral valve disease    a. 10/2015 Echo: Mod MS, mild MR.  . Moderate aortic insufficiency    a. 10/2015 Echo: EF 50-55%, no rwma, mod AI, mod MS, mild MR, sev dil LA, mild TR/PR, PASP 47mHg.  . Pain in joint, forearm   . Pain in joint, hand   . Pain in joint, lower leg   . Pulmonary nodules    s. 08/2015 CT: stable lung nodules.  . Type II diabetes mellitus (HCumberland Hill     Past Surgical History:  Procedure Laterality Date  . bronch with endobronchial biopies  09/15/2009  . COLONOSCOPY N/A 09/13/2013   Procedure: COLONOSCOPY;  Surgeon: MJeryl Columbia MD;  [facility];  Service: Endoscopy;  Laterality: N/A;  . endobronchial excision of right upper lobe tumor with laser bronchi  10/06/2009  . fiberoptic bronch with endobronchial u/s  07/02/2010  . PARS PLANA VITRECTOMY     right eye   . PARS PLANA VITRECTOMY W/ ENDOPHOTOCOAGULATION     right eye  . posterior capsulectomy     right eye  . VIDEO BRONCHOSCOPY  12/09/2009    There were no vitals filed for this visit.      Subjective Assessment - 06/07/16 1445    Subjective Still hurting in the hip with walking   Currently in Pain? Yes   Pain Score 5    Pain Location Hip   Pain Orientation Right;Anterior   Pain Type Chronic pain   Aggravating Factors  walking   Pain Relieving Factors rest                         [facility] Adult PT Treatment/Exercise - 06/07/16 0001      Ambulation/Gait   Gait Comments gait with 4WW, cues to take bigger steps, tried some side stepping but this caused pain in the right hip     Knee/Hip Exercises: Stretches   Passive Hamstring Stretch 3 reps;20 seconds   Quad Stretch 3 reps;20 seconds   Hip Flexor Stretch 3 reps;20 seconds   Piriformis Stretch 3 reps;20 seconds     Knee/Hip Exercises: Aerobic   Nustep level 4 x 7 minutes   Other Aerobic UBE x 4 minutes  Knee/Hip Exercises: Standing   Other Standing Knee Exercises seated 3# biceps and punches     Knee/Hip Exercises: Seated   Long Arc Quad 2 sets;10 reps   Long Arc Quad Weight 3 lbs.   Ball Squeeze 30 reps   Marching 2 sets;10 reps   Marching Weights 3 lbs.   Hamstring Curl 2 sets;10 reps   Hamstring Limitations tband yellow, caused bilatreral cramps int he HS     Knee/Hip Exercises: Supine   Other Supine Knee/Hip Exercises feet on ball K2C, trunk rotation, small bridges and isometric abdominals   Other Supine Knee/Hip Exercises black tband hip push down in sitting     Moist Heat Therapy   Number Minutes Moist Heat 15 Minutes   Moist Heat Location Hip     Electrical Stimulation   Electrical Stimulation Location right lateral hip    Electrical Stimulation Action IFC   Electrical Stimulation Parameters supine   Electrical Stimulation Goals Pain     Iontophoresis   Type of Iontophoresis  Dexamethasone   Location right lateral hip   Dose 75m   Time 4 hour patch                     PT Long Term Goals - 06/07/16 1521      PT LONG TERM GOAL #2   Title decrease pain 50%   Status Partially Met     PT LONG TERM GOAL #3   Title decrease TUG time to 22 seconds   Status Partially Met               Plan - 06/07/16 1521    Clinical Impression Statement Walking better with increased step length and faster speed.  Very tight and tender in the right anterior hip and into  the groin   PT Next Visit Plan continue to add exercises   Consulted and Agree with Plan of Care Patient      Patient will benefit from skilled therapeutic intervention in order to improve the following deficits and impairments:  Abnormal gait, Cardiopulmonary status limiting activity, Decreased activity tolerance, Decreased mobility, Decreased range of motion, Decreased strength, Difficulty walking, Impaired flexibility, Postural dysfunction, Pain  Visit Diagnosis: Pain in right hip  Difficulty in walking, not elsewhere classified  Muscle weakness (generalized)     Problem List Patient Active Problem List   Diagnosis Date Noted  . Venous ulcer of right leg (HWest College Corner 03/16/2016  . Type II diabetes mellitus (HCedar Springs   . Ascending aortic aneurysm (HR[place]   . Primary osteoarthritis of both hands 01/23/2016  . Varicose veins of right lower extremity with complications 003/88/8280 . Open wound of lower leg 08/26/2015  . Venous ulcer (HC[place] 08/26/2015  . Lymphedema of leg 07/23/2015  . Varicose veins of lower extremities with ulcer and inflammation (HIndian Rocks Beach 07/23/2015  . Chronic venous insufficiency 07/23/2015  . Leg ulcer (HO[place] 07/16/2015  . Ulcer of right lower leg (HSouth [place] 07/12/2015  . Cellulitis of right leg 07/12/2015  . Hyponatremia 07/12/2015  . Chronic diastolic heart failure (HKimball 07/12/2015  . Right hip pain 01/07/2015  . Primary osteoarthritis of right hip 01/07/2015  . Hip  contracture 01/07/2015  . AI (aortic incompetence) 08/07/2014  . Diastolic murmur 103/49/1791 . Asthma, mild intermittent 04/04/2013  . Leg varices 03/09/2013  . Asthma, cough variant 10/04/2012  . Eczema intertrigo 05/11/2012  . Basal cell papilloma 05/11/2012  . Carcinoid tumor 10/20/2011  . Allergic rhinitis 07/23/2011  . Airway hyperreactivity  07/23/2011  . Chronic cough 07/23/2011  . Diabetes mellitus type 2 with peripheral artery disease ([place]) 06/19/2009  . HYPERCHOLESTEROLEMIA 06/19/2009  . HYPERLIPIDEMIA 06/19/2009  . Essential hypertension 06/19/2009  . Coronary atherosclerosis 06/19/2009  . Aortic valve regurgitation 06/19/2009  . AORTIC ANEURYSM 06/19/2009  . BRONCHITIS 06/19/2009  . COPD (chronic obstructive pulmonary disease) ([place]) 06/19/2009  . GERD 06/19/2009  . EDEMA 06/19/2009  . 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

## 2016-06-08 ENCOUNTER — Encounter: Payer: Self-pay | Admitting: Physical Therapy

## 2016-06-08 ENCOUNTER — Ambulatory Visit: Payer: Medicare Other | Admitting: Physical Therapy

## 2016-06-08 DIAGNOSIS — M25551 Pain in right hip: Secondary | ICD-10-CM | POA: Diagnosis not present

## 2016-06-08 DIAGNOSIS — M6281 Muscle weakness (generalized): Secondary | ICD-10-CM | POA: Diagnosis not present

## 2016-06-08 DIAGNOSIS — R262 Difficulty in walking, not elsewhere classified: Secondary | ICD-10-CM

## 2016-06-08 NOTE — Therapy (Signed)
[place], [place], [address] Phone: [phone]   Fax:  [phone]  Physical Therapy Treatment  Patient Details  Name: [name] MRN: [id number] Date of Birth: September 02, 1932 Referring Provider: Venora Maples  Encounter Date: 2020/02/1616      PT End of Session - 06/08/16 1735    Visit Number 6   Date for PT Re-Evaluation 06/19/16   PT Start Time 1708   PT Stop Time 1800   PT Time Calculation (min) 52 min   Activity Tolerance Patient tolerated treatment well   Behavior During Therapy [facility] for tasks assessed/performed      Past Medical History:  Diagnosis Date  . Ascending aortic aneurysm ([place])    a. 08/2015 stable 5 cm Asc Ao Aneurysm.  . Bronchitis   . Carcinoid tumor   . COPD (chronic obstructive pulmonary disease) ([place])   . Coronary artery disease    a. Ca2+ noted on CT - no other clear documentation of CAD.  Marland Kitchen Difficulty in walking(719.7)   . Disturbance of skin sensation   . Edema   . Essential hypertension   . GERD (gastroesophageal reflux disease)   . Hypercholesterolemia   . Hyperlipidemia   . Mitral valve disease    a. 10/2015 Echo: Mod MS, mild MR.  . Moderate aortic insufficiency    a. 10/2015 Echo: EF 50-55%, no rwma, mod AI, mod MS, mild MR, sev dil LA, mild TR/PR, PASP 1mHg.  . Pain in joint, forearm   . Pain in joint, hand   . Pain in joint, lower leg   . Pulmonary nodules    s. 08/2015 CT: stable lung nodules.  . Type II diabetes mellitus (HDadeville     Past Surgical History:  Procedure Laterality Date  . bronch with endobronchial biopies  09/15/2009  . COLONOSCOPY N/A 09/13/2013   Procedure: COLONOSCOPY;  Surgeon: MJeryl Columbia MD;  [facility];  Service: Endoscopy;  Laterality: N/A;  . endobronchial excision of right upper lobe tumor with laser bronchi  10/06/2009  . fiberoptic bronch with endobronchial u/s  07/02/2010  . PARS PLANA VITRECTOMY     right eye   . PARS PLANA VITRECTOMY W/ ENDOPHOTOCOAGULATION     right eye  . posterior capsulectomy     right eye  . VIDEO BRONCHOSCOPY  12/09/2009    There were no vitals filed for this visit.      Subjective Assessment - 06/08/16 1713    Subjective "a little better"   Currently in Pain? Yes   Pain Score 4    Pain Location Hip   Pain Orientation Right;Anterior            [facility] PT Assessment - 06/08/16 0001      AROM   Overall AROM Comments AROM of the right hip flexion in standing to 25 degrees     Timed Up and Go Test   Normal TUG (seconds) 28                     [facility] Adult PT Treatment/Exercise - 06/08/16 0001      Knee/Hip Exercises: Stretches   Passive Hamstring Stretch 3 reps;20 seconds   Quad Stretch 3 reps;20 seconds   Hip Flexor Stretch 3 reps;20 seconds   Piriformis Stretch 3 reps;20 seconds     Knee/Hip Exercises: Aerobic   Nustep level 4 x 7 minutes   Other Aerobic UBE x 4 minutes  Knee/Hip Exercises: Machines for Strengthening   Other Machine seated row with pulleys 10# 2x10     Knee/Hip Exercises: Seated   Long Arc Quad 2 sets;10 reps   Long Arc Quad Weight 3 lbs.   Ball Squeeze 30 reps   Marching 2 sets;10 reps   Marching Weights 3 lbs.     Knee/Hip Exercises: Supine   Other Supine Knee/Hip Exercises feet on ball K2C, trunk rotation, small bridges and isometric abdominals     Moist Heat Therapy   Number Minutes Moist Heat 15 Minutes   Moist Heat Location Hip     Electrical Stimulation   Electrical Stimulation Location right posterior and anterior hip   Electrical Stimulation Action IFC   Electrical Stimulation Parameters supine with leg elevated   Electrical Stimulation Goals Pain     Iontophoresis   Type of Iontophoresis Dexamethasone   Location right lateral hip   Dose 17m   Time #5 4 hour patch                     PT Long Term Goals - 06/08/16 1737      PT LONG TERM GOAL #2   Title decrease pain 50%    Status Partially Met     PT LONG TERM GOAL #3   Title decrease TUG time to 22 seconds   Status Partially Met     PT LONG TERM GOAL #4   Title increase right hip strength to 4/5   Status On-going     PT LONG TERM GOAL #5   Title increase right hip flexion to 60 degress   Status On-going               Plan - 06/08/16 1736    Clinical Impression Statement Patient really seems to have right hip pain, very poor gait today, she is complaining of more pain in the posterior thigh along the HS.  Daughter reports that x-rays showed arthritis   PT Next Visit Plan see if treatment of the thigh posterior helps   Consulted and Agree with Plan of Care Patient      Patient will benefit from skilled therapeutic intervention in order to improve the following deficits and impairments:  Abnormal gait, Cardiopulmonary status limiting activity, Decreased activity tolerance, Decreased mobility, Decreased range of motion, Decreased strength, Difficulty walking, Impaired flexibility, Postural dysfunction, Pain  Visit Diagnosis: Difficulty in walking, not elsewhere classified  Pain in right hip  Muscle weakness (generalized)     Problem List Patient Active Problem List   Diagnosis Date Noted  . Venous ulcer of right leg (HM[place] 03/16/2016  . Type II diabetes mellitus (HCarbon Hill   . Ascending aortic aneurysm (HNew [place]   . Primary osteoarthritis of both hands 01/23/2016  . Varicose veins of right lower extremity with complications 015/40/0867 . Open wound of lower leg 08/26/2015  . Venous ulcer (HJeffersonville 08/26/2015  . Lymphedema of leg 07/23/2015  . Varicose veins of lower extremities with ulcer and inflammation (HSalt Lick 07/23/2015  . Chronic venous insufficiency 07/23/2015  . Leg ulcer (HPal[place] 07/16/2015  . Ulcer of right lower leg (HNorth Rock Springs 07/12/2015  . Cellulitis of right leg 07/12/2015  . Hyponatremia 07/12/2015  . Chronic diastolic heart failure (HRison 07/12/2015  . Right hip pain 01/07/2015  .  Primary osteoarthritis of right hip 01/07/2015  . Hip contracture 01/07/2015  . AI (aortic incompetence) 08/07/2014  . Diastolic murmur 161/95/0932 . Asthma, mild intermittent 04/04/2013  . Leg varices 03/09/2013  .  Asthma, cough variant 10/04/2012  . Eczema intertrigo 05/11/2012  . Basal cell papilloma 05/11/2012  . Carcinoid tumor 10/20/2011  . Allergic rhinitis 07/23/2011  . Airway hyperreactivity 07/23/2011  . Chronic cough 07/23/2011  . Diabetes mellitus type 2 with peripheral artery disease ([place]) 06/19/2009  . HYPERCHOLESTEROLEMIA 06/19/2009  . HYPERLIPIDEMIA 06/19/2009  . Essential hypertension 06/19/2009  . Coronary atherosclerosis 06/19/2009  . Aortic valve regurgitation 06/19/2009  . AORTIC ANEURYSM 06/19/2009  . BRONCHITIS 06/19/2009  . COPD (chronic obstructive pulmonary disease) ([place]) 06/19/2009  . GERD 06/19/2009  . EDEMA 06/19/2009  . 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

## 2016-06-09 DIAGNOSIS — Z872 Personal history of diseases of the skin and subcutaneous tissue: Secondary | ICD-10-CM | POA: Diagnosis not present

## 2016-06-09 DIAGNOSIS — Z09 Encounter for follow-up examination after completed treatment for conditions other than malignant neoplasm: Secondary | ICD-10-CM | POA: Diagnosis not present

## 2016-06-17 ENCOUNTER — Encounter: Payer: Self-pay | Admitting: Physical Therapy

## 2016-06-17 ENCOUNTER — Ambulatory Visit: Payer: Medicare Other | Admitting: Physical Therapy

## 2016-06-17 DIAGNOSIS — M25551 Pain in right hip: Secondary | ICD-10-CM | POA: Diagnosis not present

## 2016-06-17 DIAGNOSIS — R262 Difficulty in walking, not elsewhere classified: Secondary | ICD-10-CM

## 2016-06-17 DIAGNOSIS — M6281 Muscle weakness (generalized): Secondary | ICD-10-CM | POA: Diagnosis not present

## 2016-06-17 NOTE — Therapy (Signed)
[place], [place], [address] Phone: [phone]   Fax:  [phone]  Physical Therapy Treatment  Patient Details  Name: [name] MRN: [id number] Date of Birth: [birth year] Referring Provider: Venora Maples  Encounter Date: 06/17/2016      PT End of Session - 06/17/16 1431    Visit Number 7   Date for PT Re-Evaluation 07/19/16   PT Start Time 1345   PT Stop Time 1455   PT Time Calculation (min) 70 min   Activity Tolerance Patient tolerated treatment well   Behavior During Therapy [facility] for tasks assessed/performed      Past Medical History:  Diagnosis Date  . Ascending aortic aneurysm ([place])    a. 08/2015 stable 5 cm Asc Ao Aneurysm.  . Bronchitis   . Carcinoid tumor   . COPD (chronic obstructive pulmonary disease) ([place])   . Coronary artery disease    a. Ca2+ noted on CT - no other clear documentation of CAD.  Marland Kitchen Difficulty in walking(719.7)   . Disturbance of skin sensation   . Edema   . Essential hypertension   . GERD (gastroesophageal reflux disease)   . Hypercholesterolemia   . Hyperlipidemia   . Mitral valve disease    a. 10/2015 Echo: Mod MS, mild MR.  . Moderate aortic insufficiency    a. 10/2015 Echo: EF 50-55%, no rwma, mod AI, mod MS, mild MR, sev dil LA, mild TR/PR, PASP 41mHg.  . Pain in joint, forearm   . Pain in joint, hand   . Pain in joint, lower leg   . Pulmonary nodules    s. 08/2015 CT: stable lung nodules.  . Type II diabetes mellitus ([place]     Past Surgical History:  Procedure Laterality Date  . bronch with endobronchial biopies  09/15/2009  . COLONOSCOPY N/A 09/13/2013   Procedure: COLONOSCOPY;  Surgeon: MJeryl Columbia MD;  Location: [facility];  Service: Endoscopy;  Laterality: N/A;  . endobronchial excision of right upper lobe tumor with laser bronchi  10/06/2009  . fiberoptic bronch with endobronchial u/s  07/02/2010  . PARS PLANA VITRECTOMY     right eye   . PARS PLANA VITRECTOMY W/ ENDOPHOTOCOAGULATION     right eye  . posterior capsulectomy     right eye  . VIDEO BRONCHOSCOPY  12/09/2009    There were no vitals filed for this visit.      Subjective Assessment - 06/17/16 1423    Subjective I move better the last week.   Currently in Pain? Yes   Pain Score 3    Pain Location Hip   Pain Orientation Right;Anterior                         [facility] Adult PT Treatment/Exercise - 06/17/16 0001      Ambulation/Gait   Gait Comments HHA x150 feet cues to pick up feet     High Level Balance   High Level Balance Activities Side stepping     Knee/Hip Exercises: Stretches   Passive Hamstring Stretch 3 reps;20 seconds   Gastroc Stretch 3 reps;20 seconds     Knee/Hip Exercises: Aerobic   Nustep level 5 x 7 minutes   Other Aerobic UBE x 4 minutes     Knee/Hip Exercises: Machines for Strengthening   Other Machine seated row with pulleys 10# 2x10, straight arm pull down 20#, 5# chest press  Knee/Hip Exercises: Seated   Long Arc Quad 2 sets;10 reps   Long Arc Quad Weight 3 lbs.   Ball Squeeze 30 reps   Marching 2 sets;10 reps   Marching Weights 3 lbs.   Hamstring Curl 2 sets;10 reps   Hamstring Limitations tband yellow     Knee/Hip Exercises: Supine   Other Supine Knee/Hip Exercises feet on ball K2C, trunk rotation, small bridges and isometric abdominals   Other Supine Knee/Hip Exercises black tband hip push down in sitting, blue tband ankle PF     Electrical Stimulation   Electrical Stimulation Location right posterior and anterior hip   Electrical Stimulation Action IFC   Electrical Stimulation Parameters supine   Electrical Stimulation Goals Pain     Iontophoresis   Type of Iontophoresis Dexamethasone   Location right lateral hip   Dose 52m   Time #6 4 hour patch                     PT Long Term Goals - 06/17/16 1433      PT LONG TERM GOAL #1   Title independent with HEP   Status  Achieved     PT LONG TERM GOAL #2   Title decrease pain 50%   Status Partially Met     PT LONG TERM GOAL #3   Title decrease TUG time to 22 seconds   Status Partially Met     PT LONG TERM GOAL #4   Title increase right hip strength to 4/5   Status Partially Met     PT LONG TERM GOAL #5   Title increase right hip flexion to 60 degress   Status Partially Met               Plan - 06/17/16 1432    Clinical Impression Statement Better gait today, moved the right hip better, some circumduction noted but easier.  Seems to be walking better.   PT Next Visit Plan today was the ionto #6 so we will stop and continue with the exercise and functional activities   Consulted and Agree with Plan of Care Patient      Patient will benefit from skilled therapeutic intervention in order to improve the following deficits and impairments:  Abnormal gait, Cardiopulmonary status limiting activity, Decreased activity tolerance, Decreased mobility, Decreased range of motion, Decreased strength, Difficulty walking, Impaired flexibility, Postural dysfunction, Pain  Visit Diagnosis: Pain in right hip - Plan: PT plan of care cert/re-cert  Difficulty in walking, not elsewhere classified - Plan: PT plan of care cert/re-cert  Muscle weakness (generalized) - Plan: PT plan of care cert/re-cert     Problem List Patient Active Problem List   Diagnosis Date Noted  . Venous ulcer of right leg ([place] 03/16/2016  . Type II diabetes mellitus ([place]   . Ascending aortic aneurysm ([place]   . Primary osteoarthritis of both hands 01/23/2016  . Varicose veins of right lower extremity with complications 037/07/6268 . Open wound of lower leg 08/26/2015  . Venous ulcer ([place] 08/26/2015  . Lymphedema of leg 07/23/2015  . Varicose veins of lower extremities with ulcer and inflammation ([place] 07/23/2015  . Chronic venous insufficiency 07/23/2015  . Leg ulcer ([place] 07/16/2015  . Ulcer of right lower leg ([place]  07/12/2015  . Cellulitis of right leg 07/12/2015  . Hyponatremia 07/12/2015  . Chronic diastolic heart failure ([place] 07/12/2015  . Right hip pain 01/07/2015  . Primary osteoarthritis of right hip 01/07/2015  .  Hip contracture 01/07/2015  . AI (aortic incompetence) 08/07/2014  . Diastolic murmur 60/16/5800  . Asthma, mild intermittent 04/04/2013  . Leg varices 03/09/2013  . Asthma, cough variant 10/04/2012  . Eczema intertrigo 05/11/2012  . Basal cell papilloma 05/11/2012  . Carcinoid tumor 10/20/2011  . Allergic rhinitis 07/23/2011  . Airway hyperreactivity 07/23/2011  . Chronic cough 07/23/2011  . Diabetes mellitus type 2 with peripheral artery disease ([place]) 06/19/2009  . HYPERCHOLESTEROLEMIA 06/19/2009  . HYPERLIPIDEMIA 06/19/2009  . Essential hypertension 06/19/2009  . Coronary atherosclerosis 06/19/2009  . Aortic valve regurgitation 06/19/2009  . AORTIC ANEURYSM 06/19/2009  . BRONCHITIS 06/19/2009  . COPD (chronic obstructive pulmonary disease) ([place]) 06/19/2009  . GERD 06/19/2009  . EDEMA 06/19/2009  . 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

## 2016-06-22 ENCOUNTER — Ambulatory Visit: Payer: Medicare Other | Admitting: Physical Therapy

## 2016-06-23 ENCOUNTER — Other Ambulatory Visit: Payer: Self-pay | Admitting: Cardiovascular Disease

## 2016-06-24 ENCOUNTER — Ambulatory Visit: Payer: Medicare Other | Admitting: Physical Therapy

## 2016-06-24 ENCOUNTER — Encounter: Payer: Self-pay | Admitting: Physical Therapy

## 2016-06-24 DIAGNOSIS — M6281 Muscle weakness (generalized): Secondary | ICD-10-CM | POA: Diagnosis not present

## 2016-06-24 DIAGNOSIS — M25551 Pain in right hip: Secondary | ICD-10-CM

## 2016-06-24 DIAGNOSIS — R262 Difficulty in walking, not elsewhere classified: Secondary | ICD-10-CM | POA: Diagnosis not present

## 2016-06-24 NOTE — Therapy (Signed)
[place], [place], [address] Phone: [phone]   Fax:  [phone]  Physical Therapy Treatment  Patient Details  Name: [name] MRN: [id number] Date of Birth: November 30, 1931 Referring Provider: Venora Maples  Encounter Date: 06/24/2016      PT End of Session - 06/24/16 1608    Visit Number 8   Date for PT Re-Evaluation 07/19/16   PT Start Time 1600   PT Stop Time 1655   PT Time Calculation (min) 55 min   Activity Tolerance Patient tolerated treatment well   Behavior During Therapy [facility] for tasks assessed/performed      Past Medical History:  Diagnosis Date  . Ascending aortic aneurysm ([place])    a. 08/2015 stable 5 cm Asc Ao Aneurysm.  . Bronchitis   . Carcinoid tumor   . COPD (chronic obstructive pulmonary disease) ([place])   . Coronary artery disease    a. Ca2+ noted on CT - no other clear documentation of CAD.  Marland Kitchen Difficulty in walking(719.7)   . Disturbance of skin sensation   . Edema   . Essential hypertension   . GERD (gastroesophageal reflux disease)   . Hypercholesterolemia   . Hyperlipidemia   . Mitral valve disease    a. 10/2015 Echo: Mod MS, mild MR.  . Moderate aortic insufficiency    a. 10/2015 Echo: EF 50-55%, no rwma, mod AI, mod MS, mild MR, sev dil LA, mild TR/PR, PASP 36mHg.  . Pain in joint, forearm   . Pain in joint, hand   . Pain in joint, lower leg   . Pulmonary nodules    s. 08/2015 CT: stable lung nodules.  . Type II diabetes mellitus ([place]     Past Surgical History:  Procedure Laterality Date  . bronch with endobronchial biopies  09/15/2009  . COLONOSCOPY N/A 09/13/2013   Procedure: COLONOSCOPY;  Surgeon: MJeryl Columbia MD;  [facility];  Service: Endoscopy;  Laterality: N/A;  . endobronchial excision of right upper lobe tumor with laser bronchi  10/06/2009  . fiberoptic bronch with endobronchial u/s  07/02/2010  . PARS PLANA VITRECTOMY     right eye   . PARS PLANA VITRECTOMY W/ ENDOPHOTOCOAGULATION     right eye  . posterior capsulectomy     right eye  . VIDEO BRONCHOSCOPY  12/09/2009    There were no vitals filed for this visit.      Subjective Assessment - 06/24/16 1603    Subjective C/O right hip popping with walking   Currently in Pain? Yes   Pain Score 3    Pain Location Hip   Pain Orientation Right;Anterior   Pain Descriptors / Indicators Aching   Aggravating Factors  walking   Pain Relieving Factors treatment            [facility] PT Assessment - 06/24/16 0001      Timed Up and Go Test   Normal TUG (seconds) 25                     [facility] Adult PT Treatment/Exercise - 06/24/16 0001      Ambulation/Gait   Gait Comments HHA x150 feet cues to pick up feet     High Level Balance   High Level Balance Activities Side stepping     Knee/Hip Exercises: Stretches   Passive Hamstring Stretch 3 reps;20 seconds   Quad Stretch 3 reps;20 seconds   Hip Flexor Stretch 3  reps;20 seconds   Piriformis Stretch 3 reps;20 seconds     Knee/Hip Exercises: Aerobic   Nustep level 5 x 7 minutes   Other Aerobic UBE x 6 minutes     Knee/Hip Exercises: Machines for Strengthening   Other Machine seated row with pulleys 10# 2x10, straight arm pull down 20#, 5# chest press     Knee/Hip Exercises: Seated   Long Arc Quad 2 sets;10 reps   Long Arc Quad Weight 4 lbs.   Ball Squeeze 30 reps   Marching 2 sets;10 reps   Marching Weights 3 lbs.   Hamstring Curl 2 sets;10 reps   Hamstring Limitations red tband     Knee/Hip Exercises: Supine   Other Supine Knee/Hip Exercises feet on ball K2C, trunk rotation, small bridges and isometric abdominals     Moist Heat Therapy   Number Minutes Moist Heat 15 Minutes   Moist Heat Location Hip     Electrical Stimulation   Electrical Stimulation Location right posterior and anterior hip   Electrical Stimulation Action IFC   Electrical Stimulation Parameters supine   Electrical  Stimulation Goals Pain                     PT Long Term Goals - 06/24/16 1638      PT LONG TERM GOAL #2   Title decrease pain 50%   Status Partially Met     PT LONG TERM GOAL #3   Title decrease TUG time to 22 seconds   Status Partially Met               Plan - 06/24/16 1610    Clinical Impression Statement Patient is obviously moving faster today, she does have some palpable crepitus in the right hip with walking, very tight hip flexor and quad, but when trying tpo stretch this she had significant crepitus and cramping   PT Next Visit Plan try to gain some increased ROM of the hips and strength to help with her gait   Consulted and Agree with Plan of Care Patient      Patient will benefit from skilled therapeutic intervention in order to improve the following deficits and impairments:  Abnormal gait, Cardiopulmonary status limiting activity, Decreased activity tolerance, Decreased mobility, Decreased range of motion, Decreased strength, Difficulty walking, Impaired flexibility, Postural dysfunction, Pain  Visit Diagnosis: Pain in right hip  Difficulty in walking, not elsewhere classified  Muscle weakness (generalized)     Problem List Patient Active Problem List   Diagnosis Date Noted  . Venous ulcer of right leg ([place]) 03/16/2016  . Type II diabetes mellitus ([place])   . Ascending aortic aneurysm ([place])   . Primary osteoarthritis of both hands 01/23/2016  . Varicose veins of right lower extremity with complications 63/10/6008  . Open wound of lower leg 08/26/2015  . Venous ulcer ([place]) 08/26/2015  . Lymphedema of leg 07/23/2015  . Varicose veins of lower extremities with ulcer and inflammation ([place]) 07/23/2015  . Chronic venous insufficiency 07/23/2015  . Leg ulcer ([place]) 07/16/2015  . Ulcer of right lower leg ([place]) 07/12/2015  . Cellulitis of right leg 07/12/2015  . Hyponatremia 07/12/2015  . Chronic diastolic heart failure ([place]) 07/12/2015  .  Right hip pain 01/07/2015  . Primary osteoarthritis of right hip 01/07/2015  . Hip contracture 01/07/2015  . AI (aortic incompetence) 08/07/2014  . Diastolic murmur 93/23/5573  . Asthma, mild intermittent 04/04/2013  . Leg varices 03/09/2013  . Asthma, cough variant 10/04/2012  .  Eczema intertrigo 05/11/2012  . Basal cell papilloma 05/11/2012  . Carcinoid tumor 10/20/2011  . Allergic rhinitis 07/23/2011  . Airway hyperreactivity 07/23/2011  . Chronic cough 07/23/2011  . Diabetes mellitus type 2 with peripheral artery disease ([place]) 06/19/2009  . HYPERCHOLESTEROLEMIA 06/19/2009  . HYPERLIPIDEMIA 06/19/2009  . Essential hypertension 06/19/2009  . Coronary atherosclerosis 06/19/2009  . Aortic valve regurgitation 06/19/2009  . AORTIC ANEURYSM 06/19/2009  . BRONCHITIS 06/19/2009  . COPD (chronic obstructive pulmonary disease) ([place]) 06/19/2009  . GERD 06/19/2009  . EDEMA 06/19/2009  . 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

## 2016-07-01 DIAGNOSIS — Z23 Encounter for immunization: Secondary | ICD-10-CM | POA: Diagnosis not present

## 2016-07-05 ENCOUNTER — Ambulatory Visit: Payer: Medicare Other | Admitting: Physical Therapy

## 2016-07-06 ENCOUNTER — Encounter: Payer: Medicare Other | Admitting: Physical Therapy

## 2016-07-07 ENCOUNTER — Ambulatory Visit: Payer: Medicare Other | Attending: Physical Medicine & Rehabilitation | Admitting: Physical Therapy

## 2016-07-07 ENCOUNTER — Encounter: Payer: Self-pay | Admitting: Physical Therapy

## 2016-07-07 DIAGNOSIS — M25551 Pain in right hip: Secondary | ICD-10-CM | POA: Diagnosis not present

## 2016-07-07 DIAGNOSIS — R262 Difficulty in walking, not elsewhere classified: Secondary | ICD-10-CM | POA: Diagnosis not present

## 2016-07-07 DIAGNOSIS — M6281 Muscle weakness (generalized): Secondary | ICD-10-CM | POA: Diagnosis not present

## 2016-07-07 NOTE — Therapy (Signed)
[place], [place], [address] Phone: [phone]   Fax:  [phone]  Physical Therapy Treatment  Patient Details  Name: [name] MRN: [id number] Date of Birth: 11/05/1931 Referring Provider: Venora Maples  Encounter Date: 07/07/2016      PT End of Session - 07/07/16 1634    Visit Number 9   Date for PT Re-Evaluation 07/19/16   PT Start Time 5427   PT Stop Time 1645   PT Time Calculation (min) 64 min   Activity Tolerance Patient tolerated treatment well   Behavior During Therapy [facility] for tasks assessed/performed      Past Medical History:  Diagnosis Date  . Ascending aortic aneurysm ([place])    a. 08/2015 stable 5 cm Asc Ao Aneurysm.  . Bronchitis   . Carcinoid tumor   . COPD (chronic obstructive pulmonary disease) ([place])   . Coronary artery disease    a. Ca2+ noted on CT - no other clear documentation of CAD.  Marland Kitchen Difficulty in walking(719.7)   . Disturbance of skin sensation   . Edema   . Essential hypertension   . GERD (gastroesophageal reflux disease)   . Hypercholesterolemia   . Hyperlipidemia   . Mitral valve disease    a. 10/2015 Echo: Mod MS, mild MR.  . Moderate aortic insufficiency    a. 10/2015 Echo: EF 50-55%, no rwma, mod AI, mod MS, mild MR, sev dil LA, mild TR/PR, PASP 74mHg.  . Pain in joint, forearm   . Pain in joint, hand   . Pain in joint, lower leg   . Pulmonary nodules    s. 08/2015 CT: stable lung nodules.  . Type II diabetes mellitus ([place]     Past Surgical History:  Procedure Laterality Date  . bronch with endobronchial biopies  09/15/2009  . COLONOSCOPY N/A 09/13/2013   Procedure: COLONOSCOPY;  Surgeon: MJeryl Columbia MD;  [facility];  Service: Endoscopy;  Laterality: N/A;  . endobronchial excision of right upper lobe tumor with laser bronchi  10/06/2009  . fiberoptic bronch with endobronchial u/s  07/02/2010  . PARS PLANA VITRECTOMY     right eye   . PARS PLANA VITRECTOMY W/ ENDOPHOTOCOAGULATION     right eye  . posterior capsulectomy     right eye  . VIDEO BRONCHOSCOPY  12/09/2009    There were no vitals filed for this visit.      Subjective Assessment - 07/07/16 1546    Subjective Patient with some increased difficulty walking due to pain with weight bearing on the right hip   Currently in Pain? Yes   Pain Score 5    Pain Location Hip   Pain Orientation Right;Anterior            [facility] PT Assessment - 07/07/16 0001      Timed Up and Go Test   Normal TUG (seconds) 24                     [facility] Adult PT Treatment/Exercise - 07/07/16 0001      Knee/Hip Exercises: Stretches   Passive Hamstring Stretch 3 reps;20 seconds   Quad Stretch 3 reps;20 seconds   Hip Flexor Stretch 3 reps;20 seconds   Piriformis Stretch 3 reps;20 seconds     Knee/Hip Exercises: Aerobic   Nustep level 5 x 7 minutes   Other Aerobic UBE x 6 minutes level 5     Knee/Hip Exercises: Machines  for Strengthening   Other Machine scapular retraction green tband, , ankle tband exercises, isometric hip abduction, ball rolling with the right leg     Knee/Hip Exercises: Seated   Long Arc Quad 2 sets;10 reps   Long Arc Quad Weight 4 lbs.   Ball Squeeze 30 reps   Marching 2 sets;10 reps   Marching Weights 3 lbs.   Hamstring Curl 2 sets;10 reps   Hamstring Limitations green tband     Knee/Hip Exercises: Supine   Bridges with Ball Squeeze 2 sets;10 reps   Other Supine Knee/Hip Exercises feet on ball K2C, trunk rotation, small bridges and isometric abdominals     Modalities   Modalities Ultrasound     Moist Heat Therapy   Number Minutes Moist Heat 15 Minutes   Moist Heat Location Hip     Electrical Stimulation   Electrical Stimulation Location right lateral hip   Electrical Stimulation Action IFC   Electrical Stimulation Parameters sitting   Electrical Stimulation Goals Pain     Ultrasound   Ultrasound Location right lateral  hip    Ultrasound Parameters 100% 1MHz 1.6w/cm2   Ultrasound Goals Pain                     PT Long Term Goals - 07/07/16 1636      PT LONG TERM GOAL #2   Title decrease pain 50%   Status Partially Met     PT LONG TERM GOAL #3   Title decrease TUG time to 22 seconds   Status Partially Met     PT LONG TERM GOAL #4   Title increase right hip strength to 4/5   Status Partially Met               Plan - 07/07/16 1635    Clinical Impression Statement The right hip has significant crepitus at times with motions.  This is painful and trying to find exercises that she tolerates that will increase her function has been at times difficult as she has a wound on the right ankle and lower leg that she does not want to irritate   PT Next Visit Plan try to gain some increased ROM of the hips and strength to help with her gait   Consulted and Agree with Plan of Care Patient      Patient will benefit from skilled therapeutic intervention in order to improve the following deficits and impairments:  Abnormal gait, Cardiopulmonary status limiting activity, Decreased activity tolerance, Decreased mobility, Decreased range of motion, Decreased strength, Difficulty walking, Impaired flexibility, Postural dysfunction, Pain  Visit Diagnosis: Pain in right hip  Difficulty in walking, not elsewhere classified  Muscle weakness (generalized)     Problem List Patient Active Problem List   Diagnosis Date Noted  . Venous ulcer of right leg ([place]) 03/16/2016  . Type II diabetes mellitus ([place])   . Ascending aortic aneurysm ([place])   . Primary osteoarthritis of both hands 01/23/2016  . Varicose veins of right lower extremity with complications 16/07/9603  . Open wound of lower leg 08/26/2015  . Venous ulcer ([place]) 08/26/2015  . Lymphedema of leg 07/23/2015  . Varicose veins of lower extremities with ulcer and inflammation ([place]) 07/23/2015  . Chronic venous insufficiency 07/23/2015   . Leg ulcer ([place]) 07/16/2015  . Ulcer of right lower leg ([place]) 07/12/2015  . Cellulitis of right leg 07/12/2015  . Hyponatremia 07/12/2015  . Chronic diastolic heart failure ([place]) 07/12/2015  . Right hip pain  01/07/2015  . Primary osteoarthritis of right hip 01/07/2015  . Hip contracture 01/07/2015  . AI (aortic incompetence) 08/07/2014  . Diastolic murmur 58/94/8347  . Asthma, mild intermittent 04/04/2013  . Leg varices 03/09/2013  . Asthma, cough variant 10/04/2012  . Eczema intertrigo 05/11/2012  . Basal cell papilloma 05/11/2012  . Carcinoid tumor 10/20/2011  . Allergic rhinitis 07/23/2011  . Airway hyperreactivity 07/23/2011  . Chronic cough 07/23/2011  . Diabetes mellitus type 2 with peripheral artery disease ([place]) 06/19/2009  . HYPERCHOLESTEROLEMIA 06/19/2009  . HYPERLIPIDEMIA 06/19/2009  . Essential hypertension 06/19/2009  . Coronary atherosclerosis 06/19/2009  . Aortic valve regurgitation 06/19/2009  . AORTIC ANEURYSM 06/19/2009  . BRONCHITIS 06/19/2009  . COPD (chronic obstructive pulmonary disease) ([place]) 06/19/2009  . GERD 06/19/2009  . EDEMA 06/19/2009  . 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

## 2016-07-12 ENCOUNTER — Other Ambulatory Visit: Payer: Self-pay | Admitting: Internal Medicine

## 2016-07-13 ENCOUNTER — Encounter: Payer: Self-pay | Admitting: Physical Therapy

## 2016-07-13 ENCOUNTER — Ambulatory Visit: Payer: Medicare Other | Admitting: Physical Therapy

## 2016-07-13 DIAGNOSIS — R262 Difficulty in walking, not elsewhere classified: Secondary | ICD-10-CM | POA: Diagnosis not present

## 2016-07-13 DIAGNOSIS — M25551 Pain in right hip: Secondary | ICD-10-CM

## 2016-07-13 DIAGNOSIS — M6281 Muscle weakness (generalized): Secondary | ICD-10-CM

## 2016-07-13 NOTE — Therapy (Addendum)
[place] [place], [place], [address] Phone: [phone]   Fax:  [phone]  Physical Therapy Treatment  Patient Details  Name: [name] MRN: [id number] Date of Birth: [birth year] Referring Provider: Venora Maples  Encounter Date: 07/13/2016      PT End of Session - 07/13/16 1712    Visit Number 10   Date for PT Re-Evaluation 07/19/16   PT Start Time 1600   PT Stop Time 1700   PT Time Calculation (min) 60 min   Activity Tolerance Patient tolerated treatment well   Behavior During Therapy [facility] for tasks assessed/performed      Past Medical History:  Diagnosis Date  . Ascending aortic aneurysm ([place])    a. 08/2015 stable 5 cm Asc Ao Aneurysm.  . Bronchitis   . Carcinoid tumor   . COPD (chronic obstructive pulmonary disease) ([place])   . Coronary artery disease    a. Ca2+ noted on CT - no other clear documentation of CAD.  Mar[place] Kitchen Difficulty in walking(719.7)   . Disturbance of skin sensation   . Edema   . Essential hypertension   . GERD (gastroesophageal reflux disease)   . Hypercholesterolemia   . Hyperlipidemia   . Mitral valve disease    a. 10/2015 Echo: Mod MS, mild MR.  . Moderate aortic insufficiency    a. 10/2015 Echo: EF 50-55%, no rwma, mod AI, mod MS, mild MR, sev dil LA, mild TR/PR, PASP 32mHg.  . Pain in joint, forearm   . Pain in joint, hand   . Pain in joint, lower leg   . Pulmonary nodules    s. 08/2015 CT: stable lung nodules.  . Type II diabetes mellitus ([place]     Past Surgical History:  Procedure Laterality Date  . bronch with endobronchial biopies  09/15/2009  . COLONOSCOPY N/A 09/13/2013   Procedure: COLONOSCOPY;  Surgeon: MJeryl Columbia MD;  Location: [facility];  Service: Endoscopy;  Laterality: N/A;  . endobronchial excision of right upper lobe tumor with laser bronchi  10/06/2009  . fiberoptic bronch with endobronchial u/s  07/02/2010  . PARS PLANA VITRECTOMY     right  eye  . PARS PLANA VITRECTOMY W/ ENDOPHOTOCOAGULATION     right eye  . posterior capsulectomy     right eye  . VIDEO BRONCHOSCOPY  12/09/2009    There were no vitals filed for this visit.      Subjective Assessment - 07/13/16 1708    Subjective Patient with increased reports of pain in the right hip with walking   Currently in Pain? Yes   Pain Score 7    Pain Location Hip   Pain Orientation Right   Aggravating Factors  walking   Pain Relieving Factors rest            [facility] PT Assessment - 07/13/16 0001      Strength   Overall Strength Comments 4-/5                     [facility] Adult PT Treatment/Exercise - 07/13/16 0001      Knee/Hip Exercises: Stretches   Passive Hamstring Stretch 3 reps;20 seconds   Piriformis Stretch 3 reps;20 seconds   Gastroc Stretch 3 reps;20 seconds     Knee/Hip Exercises: Aerobic   Nustep level 5 x 7 minutes     Knee/Hip Exercises: Machines for Strengthening   Other Machine scapular retraction green tband, , ankle tband exercises,  isometric hip abduction, ball rolling with the right leg     Knee/Hip Exercises: Seated   Ball Squeeze 30 reps     Modalities   Modalities Ultrasound     Moist Heat Therapy   Number Minutes Moist Heat 15 Minutes   Moist Heat Location Hip     Electrical Stimulation   Electrical Stimulation Location right lateral hip   Electrical Stimulation Action IFCIFC   Electrical Stimulation Parameters sitting   Electrical Stimulation Goals Pain     Ultrasound   Ultrasound Location right lateral hip    Ultrasound Parameters 100% 1MHz 1.6 w/cm2   Ultrasound Goals Pain                     PT Long Term Goals - 07-26-2016 1715      PT LONG TERM GOAL #2   Title decrease pain 50%   Status Partially Met     PT LONG TERM GOAL #3   Title decrease TUG time to 22 seconds   Status Partially Met     PT LONG TERM GOAL #4   Title increase right hip strength to 4/5   Status Achieved     PT LONG  TERM GOAL #5   Title increase right hip flexion to 60 degress   Status Partially Met               Plan - 07-26-16 1713    Clinical Impression Statement Patient with severe audible and palpable crepitus in the right hip with every step today.  She also had increased limp and increased reports of pain.  She had difficulty with hip flexion exercises due to pain   PT Next Visit Plan I spoke with the daughter and the patient today, reported that I felt that the hip was bone on bone and recommended that they return to MD as I am worried that she has had an increase of pain   Consulted and Agree with Plan of Care Patient      Patient will benefit from skilled therapeutic intervention in order to improve the following deficits and impairments:  Abnormal gait, Cardiopulmonary status limiting activity, Decreased activity tolerance, Decreased mobility, Decreased range of motion, Decreased strength, Difficulty walking, Impaired flexibility, Postural dysfunction, Pain  Visit Diagnosis: Pain in right hip  Difficulty in walking, not elsewhere classified  Muscle weakness (generalized)       G-Codes - 2016/07/26 1723    Functional Assessment Tool Used foto 65% limitation   Functional Limitation Mobility: Walking and moving around   Mobility: Walking and Moving Around Current Status [phone]) At least 60 percent but less than 80 percent impaired, limited or restricted   Mobility: Walking and Moving Around Goal Status [phone]) At least 40 percent but less than 60 percent impaired, limited or restricted      Problem List Patient Active Problem List   Diagnosis Date Noted  . Venous ulcer of right leg ([place]) 03/16/2016  . Type II diabetes mellitus ([place])   . Ascending aortic aneurysm ([place])   . Primary osteoarthritis of both hands 01/23/2016  . Varicose veins of right lower extremity with complications 02/72/5366  . Open wound of lower leg 08/26/2015  . Venous ulcer ([place]) 08/26/2015  .  Lymphedema of leg 07/23/2015  . Varicose veins of lower extremities with ulcer and inflammation ([place]) 07/23/2015  . Chronic venous insufficiency 07/23/2015  . Leg ulcer ([place]) 07/16/2015  . Ulcer of right lower leg ([place]) 07/12/2015  . Cellulitis  of right leg 07/12/2015  . Hyponatremia 07/12/2015  . Chronic diastolic heart failure ([place]) 07/12/2015  . Right hip pain 01/07/2015  . Primary osteoarthritis of right hip 01/07/2015  . Hip contracture 01/07/2015  . AI (aortic incompetence) 08/07/2014  . Diastolic murmur 74/14/2395  . Asthma, mild intermittent 04/04/2013  . Leg varices 03/09/2013  . Asthma, cough variant 10/04/2012  . Eczema intertrigo 05/11/2012  . Basal cell papilloma 05/11/2012  . Carcinoid tumor 10/20/2011  . Allergic rhinitis 07/23/2011  . Airway hyperreactivity 07/23/2011  . Chronic cough 07/23/2011  . Diabetes mellitus type 2 with peripheral artery disease ([place]) 06/19/2009  . HYPERCHOLESTEROLEMIA 06/19/2009  . HYPERLIPIDEMIA 06/19/2009  . Essential hypertension 06/19/2009  . Coronary atherosclerosis 06/19/2009  . Aortic valve regurgitation 06/19/2009  . AORTIC ANEURYSM 06/19/2009  . BRONCHITIS 06/19/2009  . COPD (chronic obstructive pulmonary disease) ([place]) 06/19/2009  . GERD 06/19/2009  . EDEMA 06/19/2009  . 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  Patient goals were partially met. Patient is being discharged due to meeting the stated rehab goals.  ?????       Sumner Boast., PT 07/13/2016, 5:26 PM  [place], [place], [address] Phone: [phone]   Fax:  [phone]  Name: NASTEHO GLANTZ MRN: [id number] Date of Birth: [birth year]

## 2016-07-19 ENCOUNTER — Ambulatory Visit: Payer: Medicare Other | Admitting: Physical Therapy

## 2016-07-19 ENCOUNTER — Encounter: Payer: Medicare Other | Attending: Physical Medicine & Rehabilitation

## 2016-07-19 ENCOUNTER — Ambulatory Visit (HOSPITAL_BASED_OUTPATIENT_CLINIC_OR_DEPARTMENT_OTHER): Payer: Medicare Other | Admitting: Physical Medicine & Rehabilitation

## 2016-07-19 ENCOUNTER — Encounter: Payer: Self-pay | Admitting: Physical Medicine & Rehabilitation

## 2016-07-19 VITALS — BP 135/70 | HR 77 | Resp 16

## 2016-07-19 DIAGNOSIS — E119 Type 2 diabetes mellitus without complications: Secondary | ICD-10-CM | POA: Insufficient documentation

## 2016-07-19 DIAGNOSIS — Z9889 Other specified postprocedural states: Secondary | ICD-10-CM | POA: Diagnosis not present

## 2016-07-19 DIAGNOSIS — M1611 Unilateral primary osteoarthritis, right hip: Secondary | ICD-10-CM | POA: Insufficient documentation

## 2016-07-19 DIAGNOSIS — I714 Abdominal aortic aneurysm, without rupture: Secondary | ICD-10-CM | POA: Insufficient documentation

## 2016-07-19 DIAGNOSIS — I10 Essential (primary) hypertension: Secondary | ICD-10-CM | POA: Insufficient documentation

## 2016-07-19 DIAGNOSIS — R918 Other nonspecific abnormal finding of lung field: Secondary | ICD-10-CM | POA: Insufficient documentation

## 2016-07-19 DIAGNOSIS — J449 Chronic obstructive pulmonary disease, unspecified: Secondary | ICD-10-CM | POA: Insufficient documentation

## 2016-07-19 DIAGNOSIS — G8929 Other chronic pain: Secondary | ICD-10-CM | POA: Insufficient documentation

## 2016-07-19 DIAGNOSIS — M24551 Contracture, right hip: Secondary | ICD-10-CM | POA: Diagnosis not present

## 2016-07-19 MED ORDER — TRAMADOL HCL 50 MG PO TABS: 50.0000 mg | ORAL_TABLET | Freq: Four times a day (QID) | ORAL | 5 refills | Status: DC | PRN

## 2016-07-19 NOTE — Progress Notes (Signed)
Subjective:    Patient ID: [name], female    DOB: 1932-08-17, 82 y.o.   MRN: [id number]  HPI 82 year old female with history of chronic pain. She initially was complaining more of back pain. She had imaging study showing right L5-S1 facet arthropathy. However, her right hip started becoming more problematic, starting around March 2016. She had no fall or trauma to that area. X-rays demonstrated severe right hip osteoarthritis.  She has had some relief with tramadol combined with Tylenol. She underwent right hip injection under x-ray guidance performed 02/11/2015, which was not of any appreciable benefit. She received Toradol injection in April 2016. Toradol was repeated in May 2016, her pain did subside. She has additional issues with diabetes, thoracic aortic aneurysm, COPD as well as coronary artery disease She has seen Dr. Alvan Dame from orthopedics in regards to her hip. She was switched to Tylenol No. 3 In August and September 2017. She has seen physical therapy for her hip. This was not of much benefit.  We discussed her situation as well as her comorbidities. Patient has limited [place], daughter helps translate to [place] Pain Inventory Average Pain 7 Pain Right Now 7 My pain is na  In the last 24 hours, has pain interfered with the following? General activity 2 Relation with others 2 Enjoyment of life 3 What TIME of day is your pain at its worst? morning Sleep (in general) Fair  Pain is worse with: walking Pain improves with: medication Relief from Meds: 7  Mobility use a walker how many minutes can you walk? 25 ability to climb steps?  no do you drive?  no needs help with transfers  Function Do you have any goals in this area?  no  Neuro/Psych No problems in this area  Prior Studies Any changes since last visit?  no  Physicians involved in your care Any changes since last visit?  no   Family History  Problem Relation Age of Onset  . Hypertension  Mother   . Hypertension Father   . Heart attack Neg Hx   . Stroke Neg Hx    Social History   Social History  . Marital status: Married    Spouse name: N/A  . Number of children: N/A  . Years of education: N/A   Social History Main Topics  . Smoking status: Never Smoker  . Smokeless tobacco: Never Used  . Alcohol use No  . Drug use: No  . Sexual activity: Not Currently   Other Topics Concern  . None   Social History Narrative  . None   Past Surgical History:  Procedure Laterality Date  . bronch with endobronchial biopies  09/15/2009  . COLONOSCOPY N/A 09/13/2013   Procedure: COLONOSCOPY;  Surgeon: Jeryl Columbia, MD;  [facility]: [facility];  Service: Endoscopy;  Laterality: N/A;  . endobronchial excision of right upper lobe tumor with laser bronchi  10/06/2009  . fiberoptic bronch with endobronchial u/s  07/02/2010  . PARS PLANA VITRECTOMY     right eye  . PARS PLANA VITRECTOMY W/ ENDOPHOTOCOAGULATION     right eye  . posterior capsulectomy     right eye  . VIDEO BRONCHOSCOPY  12/09/2009   Past Medical History:  Diagnosis Date  . Ascending aortic aneurysm ([place])    a. 08/2015 stable 5 cm Asc Ao Aneurysm.  . Bronchitis   . Carcinoid tumor   . COPD (chronic obstructive pulmonary disease) ([place])   . Coronary artery disease    a. Ca2+  noted on CT - no other clear documentation of CAD.  Marland Kitchen Difficulty in walking(719.7)   . Disturbance of skin sensation   . Edema   . Essential hypertension   . GERD (gastroesophageal reflux disease)   . Hypercholesterolemia   . Hyperlipidemia   . Mitral valve disease    a. 10/2015 Echo: Mod MS, mild MR.  . Moderate aortic insufficiency    a. 10/2015 Echo: EF 50-55%, no rwma, mod AI, mod MS, mild MR, sev dil LA, mild TR/PR, PASP 20mHg.  . Pain in joint, forearm   . Pain in joint, hand   . Pain in joint, lower leg   . Pulmonary nodules    s. 08/2015 CT: stable lung nodules.  . Type II diabetes mellitus ([facility])    BP 135/70   Pulse  77   Resp 16   SpO2 (!) 89%   Opioid Risk Score:   Fall Risk Score:  `1  Depression screen PHQ 2/9  [facility] 07/19/2016 01/23/2016 01/07/2015  Decreased Interest 3 3 0  Down, Depressed, Hopeless 0 0 0  PHQ - 2 Score 3 3 0  Altered sleeping - 3 3  Tired, decreased energy - 1 2  Change in appetite - 0 0  Feeling bad or failure about yourself  - 0 3  Trouble concentrating - 0 0  Moving slowly or fidgety/restless - 0 3  Suicidal thoughts - 0 0  PHQ-9 Score - 7 11  Difficult doing work/chores - Not difficult at all -  Some recent data might be hidden    Review of Systems  All other systems reviewed and are negative.      Objective:   Physical Exam  Constitutional: She is oriented to person, place, and time. She appears well-developed and well-nourished.  HENT:  Head: Normocephalic and atraumatic.  Eyes: Conjunctivae and EOM are normal. Pupils are equal, round, and reactive to light.  Neck: Normal range of motion.  Musculoskeletal:       Right hip: She exhibits decreased range of motion, decreased strength and deformity. She exhibits no tenderness.       Left hip: Normal.  Neurological: She is alert and oriented to person, place, and time.  Her strength is 5/5 bilateral biceps, triceps, deltoid grip, 4 minus. Right hip flexion 5, right knee extension 5, ankle dorsiflexion on the right 5, left knee extension 5, left hip flexion 5, left ankle dorsiflexion. Negative straight leg raising.   Psychiatric: She has a normal mood and affect.  Nursing note and vitals reviewed.  right hip range of motion is 0. Internal and external rotation. She is 0, hip adduction and abduction Hip flexion contracture        Assessment & Plan:  1. End-stage osteoarthritis of the right hip. We discussed that we have tried conservative care, which consists of PT, medications, fluoroscopically guided injections. She is not a good candidate for chronic nonsteroidal anti-inflammatories  due to her coronary artery disease. I have recommended orthopedic surgical consultation as well as pulmonary and cardiology evaluations to determine surgical risk. We can continue tramadol 4 tablets per day, alternating with 1. Tylenol 4 times per day Return to clinic 6 months  We discussed options with patient and her daughter at great length. This included a conversation about various topical agents as well as why her hip pain is different from her husband's hip pain. Over half of the 25 min visit was spent counseling and coordinating care.

## 2016-07-20 ENCOUNTER — Telehealth: Payer: Self-pay | Admitting: *Deleted

## 2016-07-20 ENCOUNTER — Ambulatory Visit (INDEPENDENT_AMBULATORY_CARE_PROVIDER_SITE_OTHER): Payer: Medicare Other | Admitting: Podiatry

## 2016-07-20 DIAGNOSIS — B351 Tinea unguium: Secondary | ICD-10-CM | POA: Diagnosis not present

## 2016-07-20 DIAGNOSIS — M79675 Pain in left toe(s): Secondary | ICD-10-CM | POA: Diagnosis not present

## 2016-07-20 DIAGNOSIS — M79674 Pain in right toe(s): Secondary | ICD-10-CM

## 2016-07-20 NOTE — Progress Notes (Signed)
Subjective: 82 y.o. returns the office today for painful, elongated, thickened toenails which they cannot trim themself. Denies any redness or drainage around the nails. Denies any acute changes since last appointment and no new complaints today. Denies any systemic complaints such as fevers, chills, nausea, vomiting.   Objective: AAO 3, NAD DP/PT pulses palpable, CRT less than 3 seconds Nails hypertrophic, dystrophic, elongated, brittle, discolored 10. There is tenderness overlying the nails 1-5 bilaterally. There is no surrounding erythema or drainage along the nail sites. No open lesions or pre-ulcerative lesions are identified. No other areas of tenderness bilateral lower extremities. No overlying edema, erythema, increased warmth. No pain with calf compression, swelling, warmth, erythema.  Assessment: Patient presents with symptomatic onychomycosis  Plan: -Treatment options including alternatives, risks, complications were discussed -Nails sharply debrided 10 without complication/bleeding. -Discussed daily foot inspection. If there are any changes, to call the office immediately.  -Follow-up in 3 months or sooner if any problems are to arise. In the meantime, encouraged to call the office with any questions, concerns, changes symptoms.  Celesta Gentile, DPM

## 2016-07-20 NOTE — Telephone Encounter (Signed)
Left a message for the patient's daughter and told her we only sell Mediven stockings in 20-30 mm HG compression and that 30-40 would be too tight and not tolerated well by her mother. Told her to call back if this is not clear. Follow prn.

## 2016-08-11 DIAGNOSIS — E1169 Type 2 diabetes mellitus with other specified complication: Secondary | ICD-10-CM | POA: Diagnosis not present

## 2016-08-11 DIAGNOSIS — E7800 Pure hypercholesterolemia, unspecified: Secondary | ICD-10-CM | POA: Diagnosis not present

## 2016-08-12 DIAGNOSIS — J449 Chronic obstructive pulmonary disease, unspecified: Secondary | ICD-10-CM | POA: Diagnosis not present

## 2016-08-12 DIAGNOSIS — C7A090 Malignant carcinoid tumor of the bronchus and lung: Secondary | ICD-10-CM | POA: Diagnosis not present

## 2016-08-12 DIAGNOSIS — J4520 Mild intermittent asthma, uncomplicated: Secondary | ICD-10-CM | POA: Diagnosis not present

## 2016-08-12 DIAGNOSIS — K219 Gastro-esophageal reflux disease without esophagitis: Secondary | ICD-10-CM | POA: Diagnosis not present

## 2016-08-12 DIAGNOSIS — Z7982 Long term (current) use of aspirin: Secondary | ICD-10-CM | POA: Diagnosis not present

## 2016-08-12 DIAGNOSIS — E119 Type 2 diabetes mellitus without complications: Secondary | ICD-10-CM | POA: Diagnosis not present

## 2016-08-12 DIAGNOSIS — I8393 Asymptomatic varicose veins of bilateral lower extremities: Secondary | ICD-10-CM | POA: Diagnosis not present

## 2016-08-12 DIAGNOSIS — G5600 Carpal tunnel syndrome, unspecified upper limb: Secondary | ICD-10-CM | POA: Diagnosis not present

## 2016-08-12 DIAGNOSIS — Z7951 Long term (current) use of inhaled steroids: Secondary | ICD-10-CM | POA: Diagnosis not present

## 2016-08-12 DIAGNOSIS — L97909 Non-pressure chronic ulcer of unspecified part of unspecified lower leg with unspecified severity: Secondary | ICD-10-CM | POA: Diagnosis not present

## 2016-08-12 DIAGNOSIS — E785 Hyperlipidemia, unspecified: Secondary | ICD-10-CM | POA: Diagnosis not present

## 2016-08-12 DIAGNOSIS — I10 Essential (primary) hypertension: Secondary | ICD-10-CM | POA: Diagnosis not present

## 2016-08-12 DIAGNOSIS — S81801A Unspecified open wound, right lower leg, initial encounter: Secondary | ICD-10-CM | POA: Diagnosis not present

## 2016-08-12 DIAGNOSIS — I83813 Varicose veins of bilateral lower extremities with pain: Secondary | ICD-10-CM | POA: Diagnosis not present

## 2016-08-15 ENCOUNTER — Other Ambulatory Visit: Payer: Self-pay | Admitting: Internal Medicine

## 2016-08-23 DIAGNOSIS — I10 Essential (primary) hypertension: Secondary | ICD-10-CM | POA: Diagnosis not present

## 2016-08-23 DIAGNOSIS — E1169 Type 2 diabetes mellitus with other specified complication: Secondary | ICD-10-CM | POA: Diagnosis not present

## 2016-08-23 DIAGNOSIS — I2510 Atherosclerotic heart disease of native coronary artery without angina pectoris: Secondary | ICD-10-CM | POA: Diagnosis not present

## 2016-08-23 DIAGNOSIS — I350 Nonrheumatic aortic (valve) stenosis: Secondary | ICD-10-CM | POA: Diagnosis not present

## 2016-08-23 DIAGNOSIS — E7800 Pure hypercholesterolemia, unspecified: Secondary | ICD-10-CM | POA: Diagnosis not present

## 2016-08-25 DIAGNOSIS — M1611 Unilateral primary osteoarthritis, right hip: Secondary | ICD-10-CM | POA: Diagnosis not present

## 2016-08-25 DIAGNOSIS — M25551 Pain in right hip: Secondary | ICD-10-CM | POA: Diagnosis not present

## 2016-08-30 ENCOUNTER — Other Ambulatory Visit: Payer: Self-pay | Admitting: Orthopedic Surgery

## 2016-08-30 DIAGNOSIS — M25551 Pain in right hip: Secondary | ICD-10-CM

## 2016-09-08 ENCOUNTER — Other Ambulatory Visit: Payer: Medicare Other

## 2016-09-09 ENCOUNTER — Ambulatory Visit
Admission: RE | Admit: 2016-09-09 | Discharge: 2016-09-09 | Disposition: A | Discharge status: Home / Self Care | Payer: Medicare Other | Source: Ambulatory Visit | Attending: Orthopedic Surgery | Admitting: Orthopedic Surgery

## 2016-09-09 ENCOUNTER — Other Ambulatory Visit: Payer: Medicare Other

## 2016-09-09 DIAGNOSIS — M25551 Pain in right hip: Secondary | ICD-10-CM

## 2016-09-09 DIAGNOSIS — M1611 Unilateral primary osteoarthritis, right hip: Secondary | ICD-10-CM | POA: Diagnosis not present

## 2016-09-09 MED ORDER — METHYLPREDNISOLONE ACETATE 40 MG/ML INJ SUSP (RADIOLOG
120.0000 mg | Freq: Once | INTRAMUSCULAR | Status: AC
  Administered 2016-09-09: 120 mg via INTRA_ARTICULAR

## 2016-09-09 MED ORDER — IOPAMIDOL (ISOVUE-M 200) INJECTION 41%
1.0000 mL | Freq: Once | INTRAMUSCULAR | Status: AC
  Administered 2016-09-09: 1 mL via INTRA_ARTICULAR

## 2016-09-13 ENCOUNTER — Other Ambulatory Visit: Payer: Self-pay | Admitting: Cardiovascular Disease

## 2016-09-14 NOTE — Progress Notes (Signed)
Office Visit    Patient Name: [name] Date of Encounter: 09/15/2016  Primary Care Provider:  Apolonio Schneiders, MD Primary Cardiologist:  P. Johnsie Cancel, MD   Chief Complaint    82 year old female with history of hypertension, mitral and aortic valvular disease, and descending aortic aneurysm, who presents for follow-up.  Past Medical History    Past Medical History:  Diagnosis Date  . Ascending aortic aneurysm ([place])    a. 08/2015 stable 5 cm Asc Ao Aneurysm.  . Bronchitis   . Carcinoid tumor   . COPD (chronic obstructive pulmonary disease) ([place])   . Coronary artery disease    a. Ca2+ noted on CT - no other clear documentation of CAD.  Marland Kitchen Difficulty in walking(719.7)   . Disturbance of skin sensation   . Edema   . Essential hypertension   . GERD (gastroesophageal reflux disease)   . Hypercholesterolemia   . Hyperlipidemia   . Mitral valve disease    a. 10/2015 Echo: Mod MS, mild MR.  . Moderate aortic insufficiency    a. 10/2015 Echo: EF 50-55%, no rwma, mod AI, mod MS, mild MR, sev dil LA, mild TR/PR, PASP 12mHg.  . Pain in joint, forearm   . Pain in joint, hand   . Pain in joint, lower leg   . Pulmonary nodules    s. 08/2015 CT: stable lung nodules.  . Type II diabetes mellitus ([place]    Past Surgical History:  Procedure Laterality Date  . bronch with endobronchial biopies  09/15/2009  . COLONOSCOPY N/A 09/13/2013   Procedure: COLONOSCOPY;  Surgeon: MJeryl Columbia MD;  Location: [facility];  Service: Endoscopy;  Laterality: N/A;  . endobronchial excision of right upper lobe tumor with laser bronchi  10/06/2009  . fiberoptic bronch with endobronchial u/s  07/02/2010  . PARS PLANA VITRECTOMY     right eye  . PARS PLANA VITRECTOMY W/ ENDOPHOTOCOAGULATION     right eye  . posterior capsulectomy     right eye  . VIDEO BRONCHOSCOPY  12/09/2009    Allergies  No Known Allergies  History of Present Illness    82year old female with the above complex past  medical history. She has an ascending aortic aneurysm that has been stable at 5 cm for some time, and is followed closely by thoracic surgery. She also has aortic and mitral valve disease. She is not felt to be a candidate for surgery and thus has been conservatively managed over the years. She also has a history of labile hypertension and has been on a variety of medication regimens over the years. Her daughter is with her today and assists in translation and drives the conversation.  The patient offers no complaints and denies chest pain or dyspnea. There is no history of PND, orthopnea, dizziness, syncope, or early satiety. She does have chronic right greater than left lower extremity edema and is being evaluated in vein clinic for laser ablation of the right great saphenous vein to facilitate wound healing on that side and also help prevent future stasis ulcers.  Echo 11/18/15:  Study Conclusions  - Left ventricle: The cavity size was mildly dilated. There was   mild concentric hypertrophy. Systolic function was normal. The   estimated ejection fraction was in the range of 50% to 55%. Wall   motion was normal; there were no regional wall motion   abnormalities. The study is not technically sufficient to allow   evaluation of LV diastolic function. - Aortic valve:  There was moderate regurgitation. Valve area (VTI):   1.96 cm^2. Valve area (Vmax): 2.08 cm^2. Valve area (Vmean): 1.87   cm^2. - Mitral valve: Severely calcified annulus. Mobility was   restricted. The findings are consistent with moderate stenosis.   There was mild regurgitation. Valve area by pressure half-time:   2.1 cm^2. Valve area by continuity equation (using LVOT flow):   2.93 cm^2. - Left atrium: The atrium was severely dilated. - Right ventricle: The cavity size was mildly dilated. Wall   thickness was normal. Systolic function was normal. - Right atrium: The atrium was mildly dilated. Central venous   pressure (est):  15 mm Hg. - Tricuspid valve: There was mild regurgitation. - Pulmonic valve: There was trivial regurgitation. - Pulmonary arteries: Systolic pressure was severely increased. PA   peak pressure: 61 mm Hg (S). - Inferior vena cava: The vessel was dilated. The respirophasic   diameter changes were blunted (< 50%), consistent with elevated   central venous pressure.  Home Medications    Prior to Admission medications   Medication Sig Start Date End Date Taking? Authorizing Provider  albuterol (PROVENTIL HFA;VENTOLIN HFA) 108 (90 Base) MCG/ACT inhaler Inhale 2 puffs into the lungs every 6 (six) hours as needed for wheezing or shortness of breath.   Yes Historical Provider, MD  amLODipine (NORVASC) 10 MG tablet TAKE 1 TABLET (10 MG TOTAL) BY MOUTH DAILY. 01/27/16  Yes Josue Hector, MD  aspirin EC 81 MG tablet Take 81 mg by mouth every evening. 7 pm   Yes Historical Provider, MD  atorvastatin (LIPITOR) 10 MG tablet Take 10 mg by mouth at bedtime. 06/17/15  Yes Historical Provider, MD  BENICAR 40 MG tablet TAKE 1 TABLET (40 MG TOTAL) BY MOUTH 2 (TWO) TIMES DAILY. 10/21/15  Yes Josue Hector, MD  FeFum-FePoly-FA-B Cmp-C-Biot (INTEGRA PLUS) CAPS TAKE ONE CAPSULE BY MOUTH EVERY DAY *NOT COVERED* 12/22/15  Yes Curt Bears, MD  fluticasone (FLOVENT HFA) 110 MCG/ACT inhaler Inhale 2 puffs into the lungs 2 (two) times daily.   Yes Historical Provider, MD  furosemide (LASIX) 20 MG tablet Take 1 tablet (20 mg total) by mouth daily as needed for fluid or edema. 08/29/15  Yes Josue Hector, MD  glimepiride (AMARYL) 1 MG tablet Take 1 mg by mouth daily.    Yes Historical Provider, MD  hydrALAZINE (APRESOLINE) 50 MG tablet Take 50 mg by mouth 3 (three) times daily.    Yes Historical Provider, MD  montelukast (SINGULAIR) 10 MG tablet Take 10 mg by mouth at bedtime. 06/04/15  Yes Historical Provider, MD  Multiple Vitamin (MULITIVITAMIN WITH MINERALS) TABS Take 1 tablet by mouth daily.   Yes Historical Provider,  MD  nitroGLYCERIN (NITROSTAT) 0.4 MG SL tablet Place 0.4 mg under the tongue every 5 (five) minutes as needed for chest pain (3 DOSES MAX).   Yes Historical Provider, MD  Omega-3 Fatty Acids (FISH OIL) 1200 MG CAPS Take 1 capsule by mouth daily.   Yes Historical Provider, MD  omeprazole (PRILOSEC) 20 MG capsule Take 20 mg by mouth daily before breakfast. 06/04/15  Yes Historical Provider, MD  pioglitazone (ACTOS) 15 MG tablet Take 7.5 mg by mouth daily.    Yes Historical Provider, MD  potassium chloride (K-DUR,KLOR-CON) 10 MEQ tablet Take 20 mEq by mouth daily.   Yes Historical Provider, MD  Probiotic Product (PROBIOTIC PO) Take 1 tablet by mouth daily.   Yes Historical Provider, MD  Spacer/Aero-Holding Chambers (AEROCHAMBER MINI CHAMBER) DEVI use as directed  with your inhalers  04/27/11  Yes Historical Provider, MD    Review of Systems     as above, patient has been doing recently well. She has chronic right lower extremity edema and is followed in wound clinic with improved healing. No recent history of chest pain, dyspnea, PND, orthopnea or dizziness, syncope, or early satiety.  All other systems reviewed and are otherwise negative except as noted above.  Physical Exam    VS:  BP 110/60   Pulse 94   Ht '5\' 2"'$  (1.575 m)   Wt 70.9 kg (156 lb 6.4 oz)   SpO2 96%   BMI 28.61 kg/m  , BMI Body mass index is 28.61 kg/m. GEN: Well nourished, well developed, in no acute distress.  HEENT: normal.  Neck: Supple, no JVD, carotid bruits, or masses. Cardiac: RRR, 3/6 SEM  And Diast murmur loudest @ upper sternal border but heard throughout.  No rubs, or gallops. No clubbing, cyanosis, edema.  Radials/DP/PT 2+ and equal bilaterally.  Respiratory:  Respirations regular and unlabored, clear to auscultation bilaterally. GI: Soft, nontender, nondistended, BS + x 4. MS: no deformity or atrophy. Skin: warm and dry, no rash. Neuro:  Strength and sensation are intact. Psych: Normal affect.  Accessory  Clinical Findings     regular sinus rhythm, 66,  Left axis deviation , left anterior fascicular block ,first-degree AV block, PACs , PVC, poor R progression -no acute ST or T changes.  Assessment & Plan    1.   Essential hypertension:  Hydralazine 50 tid . She otherwise remains on Benicar and amlodipine.   2.  Ascending aortic aneurysm: stable at 5 cm based on CT in November 2016. She is not felt to be a surgical candidate. She is being managed conservatively by Dr. Roxan Hockey. As her blood pressure has been higher at home, we are reinforcing that she take her hydralazine as prescribed-3 times a day.   3. Mitral and aortic valve disease: conservatively managed in the setting of poor surgical candidacy. Echo 11/18/15 moderate MS/mild MR and moderate AR    4. Disposition: patient will follow-up with me in 6 months   She is seeing Dr Alvan Dame for orthopedic issues although not ideal she would not Be a prohibitive candidate for THR/TKR if needed   [employer]

## 2016-09-15 ENCOUNTER — Ambulatory Visit (INDEPENDENT_AMBULATORY_CARE_PROVIDER_SITE_OTHER): Payer: Medicare Other | Admitting: Cardiovascular Disease

## 2016-09-15 ENCOUNTER — Encounter: Payer: Self-pay | Admitting: Cardiovascular Disease

## 2016-09-15 VITALS — BP 110/60 | HR 94 | Ht 62.0 in | Wt 156.4 lb

## 2016-09-15 DIAGNOSIS — I059 Rheumatic mitral valve disease, unspecified: Secondary | ICD-10-CM

## 2016-09-15 DIAGNOSIS — I359 Nonrheumatic aortic valve disorder, unspecified: Secondary | ICD-10-CM | POA: Diagnosis not present

## 2016-09-15 MED ORDER — HYDRALAZINE HCL 50 MG PO TABS: 50.0000 mg | ORAL_TABLET | Freq: Three times a day (TID) | ORAL | 3 refills | Status: DC

## 2016-09-15 NOTE — Patient Instructions (Signed)
Medication Instructions:  Your physician recommends that you continue on your current medications as directed. Please refer to the Current Medication list given to you today.   Labwork: None  Testing/Procedures: None  Follow-Up: Your physician wants you to follow-up in: 6 months with Dr. Johnsie Cancel. You will receive a reminder letter in the mail two months in advance. If you don't receive a letter, please call our office to schedule the follow-up appointment.   Any Other Special Instructions Will Be Listed Below (If Applicable).     If you need a refill on your cardiac medications before your next appointment, please call your pharmacy.

## 2016-09-21 ENCOUNTER — Other Ambulatory Visit: Payer: Self-pay

## 2016-09-21 ENCOUNTER — Telehealth: Payer: Self-pay | Admitting: Cardiovascular Disease

## 2016-09-21 MED ORDER — OLMESARTAN MEDOXOMIL 40 MG PO TABS: 40.0000 mg | ORAL_TABLET | Freq: Every day | ORAL | 11 refills | Status: DC

## 2016-09-21 NOTE — Telephone Encounter (Signed)
Benicar requires a PA. [employer] requires step therapy. Olmesartan does not. New rx sent to [facility].

## 2016-09-21 NOTE — Telephone Encounter (Signed)
New message  Holland Falling needs authorization for Benicar '40mg'$ , 1 tab 2xdaily  Fax: [phone]

## 2016-09-23 ENCOUNTER — Telehealth: Payer: Self-pay | Admitting: Cardiovascular Disease

## 2016-09-23 ENCOUNTER — Emergency Department (HOSPITAL_COMMUNITY): Payer: Medicare Other

## 2016-09-23 ENCOUNTER — Encounter (HOSPITAL_COMMUNITY): Payer: Self-pay | Admitting: Emergency Medicine

## 2016-09-23 ENCOUNTER — Emergency Department (HOSPITAL_COMMUNITY)
Admission: EM | Admit: 2016-09-23 | Discharge: 2016-09-23 | Disposition: A | Discharge status: Home / Self Care | Payer: Medicare Other | Attending: Emergency Medicine | Admitting: Emergency Medicine

## 2016-09-23 DIAGNOSIS — Z7982 Long term (current) use of aspirin: Secondary | ICD-10-CM | POA: Insufficient documentation

## 2016-09-23 DIAGNOSIS — M25551 Pain in right hip: Secondary | ICD-10-CM | POA: Diagnosis not present

## 2016-09-23 DIAGNOSIS — J40 Bronchitis, not specified as acute or chronic: Secondary | ICD-10-CM | POA: Diagnosis not present

## 2016-09-23 DIAGNOSIS — E119 Type 2 diabetes mellitus without complications: Secondary | ICD-10-CM | POA: Diagnosis not present

## 2016-09-23 DIAGNOSIS — I5032 Chronic diastolic (congestive) heart failure: Secondary | ICD-10-CM | POA: Diagnosis not present

## 2016-09-23 DIAGNOSIS — R05 Cough: Secondary | ICD-10-CM | POA: Diagnosis not present

## 2016-09-23 DIAGNOSIS — I2510 Atherosclerotic heart disease of native coronary artery without angina pectoris: Secondary | ICD-10-CM | POA: Diagnosis not present

## 2016-09-23 DIAGNOSIS — J449 Chronic obstructive pulmonary disease, unspecified: Secondary | ICD-10-CM | POA: Diagnosis not present

## 2016-09-23 DIAGNOSIS — Z79899 Other long term (current) drug therapy: Secondary | ICD-10-CM | POA: Diagnosis not present

## 2016-09-23 DIAGNOSIS — I110 Hypertensive heart disease with heart failure: Secondary | ICD-10-CM | POA: Diagnosis not present

## 2016-09-23 LAB — URINALYSIS, ROUTINE W REFLEX MICROSCOPIC
BILIRUBIN URINE: NEGATIVE
GLUCOSE, UA: NEGATIVE mg/dL
HGB URINE DIPSTICK: NEGATIVE
Ketones, ur: NEGATIVE mg/dL
NITRITE: NEGATIVE
PH: 6.5 (ref 5.0–8.0)
Protein, ur: NEGATIVE mg/dL
SPECIFIC GRAVITY, URINE: 1.014 (ref 1.005–1.030)

## 2016-09-23 LAB — I-STAT TROPONIN, ED: Troponin i, poc: 0.03 ng/mL (ref 0.00–0.08)

## 2016-09-23 LAB — URINE MICROSCOPIC-ADD ON: RBC / HPF: NONE SEEN RBC/hpf (ref 0–5)

## 2016-09-23 LAB — CBC
HEMATOCRIT: 41.2 % (ref 36.0–46.0)
HEMOGLOBIN: 13.3 g/dL (ref 12.0–15.0)
MCH: 29.6 pg (ref 26.0–34.0)
MCHC: 32.3 g/dL (ref 30.0–36.0)
MCV: 91.6 fL (ref 78.0–100.0)
Platelets: 246 10*3/uL (ref 150–400)
RBC: 4.5 MIL/uL (ref 3.87–5.11)
RDW: 14.1 % (ref 11.5–15.5)
WBC: 7.6 10*3/uL (ref 4.0–10.5)

## 2016-09-23 LAB — BASIC METABOLIC PANEL
Anion gap: 11 (ref 5–15)
BUN: 25 mg/dL — AB (ref 6–20)
CHLORIDE: 102 mmol/L (ref 101–111)
CO2: 26 mmol/L (ref 22–32)
CREATININE: 0.72 mg/dL (ref 0.44–1.00)
Calcium: 9 mg/dL (ref 8.9–10.3)
GFR calc Af Amer: >60 mL/min (ref >60)
GFR calc non Af Amer: >60 mL/min (ref >60)
Glucose, Bld: 237 mg/dL — ABNORMAL HIGH (ref 65–99)
Potassium: 4 mmol/L (ref 3.5–5.1)
Sodium: 139 mmol/L (ref 135–145)

## 2016-09-23 MED ORDER — AZITHROMYCIN 250 MG PO TABS: 250.0000 mg | ORAL_TABLET | Freq: Every day | ORAL | 0 refills | Status: DC

## 2016-09-23 NOTE — ED Triage Notes (Signed)
Pt from home with multiple complaints.  Pt daughter reports cough pain, left hip pain, and right foot wound pain.  Pt in NAD, A&O.

## 2016-09-23 NOTE — Telephone Encounter (Signed)
New Message:   Please call about pt's medicine. I could not understand what her daughter was saying.

## 2016-09-23 NOTE — Discharge Instructions (Signed)
Chest x-ray showed no pneumonia. However because of the longevity of your symptoms, we'll start an antibiotic. Your right lower leg is healing well. Follow-up with your regular doctor for the hip pain.

## 2016-09-23 NOTE — Telephone Encounter (Signed)
Patient needs help with covering her medication- Benicar for 2018. Patient's daugter (DPR) left number for our office to call to give clinical information on why patient needs this medication. Will send to [facility] our pre auth nurse.

## 2016-09-23 NOTE — Telephone Encounter (Signed)
Left voicemail message for patient's daughter.

## 2016-09-23 NOTE — ED Provider Notes (Signed)
[name]   CSN: [id number] Arrival date & time: 09/23/16  1405     History   Chief Complaint Chief Complaint  Patient presents with  . Cough  . Hip Pain  . Wound Check    HPI [name] is a 82 y.o. female.  Level V caveat secondary to language barrier.  Most of history obtained from daughter.  Patient complains of productive cough for 1 week.  No fever, sweats, chills. Additionally, she complains of right lateral hip pain which is chronic and concerned about a healing wound on her right ankle. Patient lives at home with her daughter and husband      Past Medical History:  Diagnosis Date  . Ascending aortic aneurysm ([place])    a. 08/2015 stable 5 cm Asc Ao Aneurysm.  . Bronchitis   . Carcinoid tumor   . COPD (chronic obstructive pulmonary disease) ([place])   . Coronary artery disease    a. Ca2+ noted on CT - no other clear documentation of CAD.  Marland Kitchen Difficulty in walking(719.7)   . Disturbance of skin sensation   . Edema   . Essential hypertension   . GERD (gastroesophageal reflux disease)   . Hypercholesterolemia   . Hyperlipidemia   . Mitral valve disease    a. 10/2015 Echo: Mod MS, mild MR.  . Moderate aortic insufficiency    a. 10/2015 Echo: EF 50-55%, no rwma, mod AI, mod MS, mild MR, sev dil LA, mild TR/PR, PASP 74mHg.  . Pain in joint, forearm   . Pain in joint, hand   . Pain in joint, lower leg   . Pulmonary nodules    s. 08/2015 CT: stable lung nodules.  . Type II diabetes mellitus ([facility]     Patient Active Problem List   Diagnosis Date Noted  . Venous ulcer of right leg ([place] 03/16/2016  . Type II diabetes mellitus ([place]   . Ascending aortic aneurysm ([place]   . Primary osteoarthritis of both hands 01/23/2016  . Varicose veins of right lower extremity with complications 002/54/2706 . Open wound of lower leg 08/26/2015  . Venous ulcer 08/26/2015  . Lymphedema of leg 07/23/2015  . Varicose veins of lower extremities with ulcer  and inflammation ([place] 07/23/2015  . Chronic venous insufficiency 07/23/2015  . Leg ulcer ([place] 07/16/2015  . Ulcer of right lower leg ([place] 07/12/2015  . Cellulitis of right leg 07/12/2015  . Hyponatremia 07/12/2015  . Chronic diastolic heart failure ([place] 07/12/2015  . Right hip pain 01/07/2015  . Primary osteoarthritis of right hip 01/07/2015  . Hip contracture 01/07/2015  . AI (aortic incompetence) 08/07/2014  . Diastolic murmur 123/76/2831 . Asthma, mild intermittent 04/04/2013  . Leg varices 03/09/2013  . Asthma, cough variant 10/04/2012  . Eczema intertrigo 05/11/2012  . Basal cell papilloma 05/11/2012  . Carcinoid tumor 10/20/2011  . Allergic rhinitis 07/23/2011  . Airway hyperreactivity 07/23/2011  . Chronic cough 07/23/2011  . Diabetes mellitus type 2 with peripheral artery disease ([place] 06/19/2009  . HYPERCHOLESTEROLEMIA 06/19/2009  . HYPERLIPIDEMIA 06/19/2009  . Essential hypertension 06/19/2009  . Coronary atherosclerosis 06/19/2009  . Aortic valve regurgitation 06/19/2009  . AORTIC ANEURYSM 06/19/2009  . BRONCHITIS 06/19/2009  . COPD (chronic obstructive pulmonary disease) ([place] 06/19/2009  . GERD 06/19/2009  . EDEMA 06/19/2009  . 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 bronch with endobronchial biopies  09/15/2009  . COLONOSCOPY N/A 09/13/2013  Procedure: COLONOSCOPY;  Surgeon: Jeryl Columbia, MD;  [facility];  Service: Endoscopy;  Laterality: N/A;  . endobronchial excision of right upper lobe tumor with laser bronchi  10/06/2009  . fiberoptic bronch with endobronchial u/s  07/02/2010  . PARS PLANA VITRECTOMY     right eye  . PARS PLANA VITRECTOMY W/ ENDOPHOTOCOAGULATION     right eye  . posterior capsulectomy     right eye  . VIDEO BRONCHOSCOPY  12/09/2009    OB History    Gravida Para Term Preterm AB Living   '2 1 1   1 1   '$ SAB TAB Ectopic Multiple Live Births   1       1         Home Medications    Prior to Admission medications   Medication Sig Start Date End Date Taking? Authorizing Provider  albuterol (PROVENTIL HFA;VENTOLIN HFA) 108 (90 Base) MCG/ACT inhaler Inhale 2 puffs into the lungs every 6 (six) hours as needed for wheezing or shortness of breath.   Yes Historical Provider, MD  amLODipine (NORVASC) 10 MG tablet TAKE 1 TABLET (10 MG TOTAL) BY MOUTH DAILY. 06/24/16  Yes Josue Hector, MD  aspirin EC 81 MG tablet Take 81 mg by mouth every evening. 7 pm   Yes Historical Provider, MD  atorvastatin (LIPITOR) 10 MG tablet Take 10 mg by mouth at bedtime. 06/17/15  Yes Historical Provider, MD  FeFum-FePoly-FA-B Cmp-C-Biot (INTEGRA PLUS) CAPS TAKE ONE CAPSULE BY MOUTH EVERY DAY *NOT COVERED* 07/12/16  Yes Curt Bears, MD  fluticasone (FLOVENT HFA) 110 MCG/ACT inhaler Inhale 2 puffs into the lungs 2 (two) times daily.   Yes Historical Provider, MD  furosemide (LASIX) 20 MG tablet Take 1 tablet (20 mg total) by mouth daily as needed for fluid or edema. 08/29/15  Yes Josue Hector, MD  glimepiride (AMARYL) 1 MG tablet Take 1 mg by mouth daily.    Yes Historical Provider, MD  hydrALAZINE (APRESOLINE) 50 MG tablet Take 1 tablet (50 mg total) by mouth 3 (three) times daily. 09/15/16  Yes Josue Hector, MD  montelukast (SINGULAIR) 10 MG tablet Take 10 mg by mouth at bedtime. 06/04/15  Yes Historical Provider, MD  Multiple Vitamin (MULITIVITAMIN WITH MINERALS) TABS Take 1 tablet by mouth daily.   Yes Historical Provider, MD  nitroGLYCERIN (NITROSTAT) 0.4 MG SL tablet PLACE 1 TABLET UNDER THE TONGUE EVERY 5 MINUTES AS NEEDED FOR CHEST PAIN 09/14/16  Yes Josue Hector, MD  olmesartan (BENICAR) 40 MG tablet Take 1 tablet (40 mg total) by mouth daily. 09/21/16  Yes Josue Hector, MD  Omega-3 Fatty Acids (FISH OIL) 1200 MG CAPS Take 1 capsule by mouth daily.   Yes Historical Provider, MD  omeprazole (PRILOSEC) 20 MG capsule Take 20 mg by mouth daily before breakfast.  06/04/15  Yes Historical Provider, MD  pioglitazone (ACTOS) 15 MG tablet Take 7.5 mg by mouth daily.    Yes Historical Provider, MD  potassium chloride (K-DUR,KLOR-CON) 10 MEQ tablet Take 20 mEq by mouth daily.   Yes Historical Provider, MD  Probiotic Product (PROBIOTIC PO) Take 1 tablet by mouth daily.   Yes Historical Provider, MD  traMADol (ULTRAM) 50 MG tablet Take 1 tablet (50 mg total) by mouth every 6 (six) hours as needed. 07/19/16  Yes Charlett Blake, MD  azithromycin (ZITHROMAX) 250 MG tablet Take 1 tablet (250 mg total) by mouth daily. Take first 2 tablets together, then 1 every day until finished.  09/23/16   Nat Christen, MD  Spacer/Aero-Holding Chambers (AEROCHAMBER MINI CHAMBER) DEVI use as directed with your inhalers  04/27/11   Historical Provider, MD    Family History Family History  Problem Relation Age of Onset  . Hypertension Mother   . Hypertension Father   . Heart attack Neg Hx   . Stroke Neg Hx     Social History Social History  Substance Use Topics  . Smoking status: Never Smoker  . Smokeless tobacco: Never Used  . Alcohol use No     Allergies   Patient has no known allergies.   Review of Systems Review of Systems  Reason unable to perform ROS: language barrier.     Physical Exam Updated Vital Signs BP 120/68   Pulse 89   Temp 97.4 F (36.3 C) (Oral)   Resp 17   Ht 5' (1.524 m)   Wt 155 lb (70.3 kg)   SpO2 94%   BMI 30.27 kg/m   Physical Exam  Constitutional: She is oriented to person, place, and time.  NAD, alert, normal respirations  HENT:  Head: Normocephalic and atraumatic.  Eyes: Conjunctivae are normal.  Neck: Neck supple.  Cardiovascular: Normal rate and regular rhythm.   Pulmonary/Chest: Effort normal and breath sounds normal.  Abdominal: Soft. Bowel sounds are normal.  Musculoskeletal:  Minimal tenderness right lateral hip (chronic)  Neurological: She is alert and oriented to person, place, and time.  Skin: Skin is warm and  dry.  Well healing wound on right lateral ankle  Psychiatric: She has a normal mood and affect. Her behavior is normal.  Nursing [name] and vitals reviewed.    ED Treatments / Results  Labs (all labs ordered are listed, but only abnormal results are displayed) Labs Reviewed  BASIC METABOLIC PANEL - Abnormal; Notable for the following:       Result Value   Glucose, Bld 237 (*)    BUN 25 (*)    All other components within normal limits  URINALYSIS, ROUTINE W REFLEX MICROSCOPIC (NOT AT [facility]) - Abnormal; Notable for the following:    Leukocytes, UA TRACE (*)    All other components within normal limits  URINE MICROSCOPIC-ADD ON - Abnormal; Notable for the following:    Squamous Epithelial / LPF 0-5 (*)    Bacteria, UA RARE (*)    Casts HYALINE CASTS (*)    All other components within normal limits  CBC  I-STAT TROPOININ, ED    EKG  EKG Interpretation  Date/Time:  Thursday September 23 2016 14:30:45 EST Ventricular Rate:  90 PR Interval:  352 QRS Duration: 94 QT Interval:  348 QTC Calculation: 425 R Axis:   -70 Text Interpretation:  Sinus rhythm with marked sinus arrhythmia with 1st degree A-V block Left anterior fascicular block Possible Anterior infarct , age undetermined Abnormal ECG Confirmed by Yehudah Standing  MD, Torrion Witter ([id number]) on 09/23/2016 5:59:52 PM       Radiology Dg Chest 2 View  Result Date: 09/23/2016 CLINICAL DATA:  Ongoing cough for 1 week EXAM: CHEST  2 VIEW COMPARISON:  Chest CT 09/15/2015 FINDINGS: Chronic cardiomegaly. Tortuous aorta that is aneurysmal by 2016 chest CT. No gross change in mediastinal contours from prior. Chronic numerous bilateral pulmonary nodules without detectable change. There is no edema, consolidation, effusion, or pneumothorax. Advanced thoracic spine degeneration without acute or aggressive finding. IMPRESSION: Chronic findings without detectable change from 2016. No evidence of acute disease. Electronically Signed   By: Neva Seat.D.  On: 09/23/2016 15:18    Procedures Procedures (including critical care time)  Medications Ordered in ED Medications - No data to display   Initial Impression / Assessment and Plan / ED Course  I have reviewed the triage vital signs and the nursing notes.  Pertinent labs & imaging results that were available during my care of the patient were reviewed by me and considered in my medical decision making (see chart for details).  Clinical Course     Patient is in no acute distress. Chest x-ray shows no pneumonia and urinalysis negative for infection. Will Rx antibiotic secondary to longevity of symptoms. Discharge medications Zithromax  Final Clinical Impressions(s) / ED Diagnoses   Final diagnoses:  Bronchitis  Right hip pain    New Prescriptions Discharge Medication List as of 09/23/2016  5:30 PM    START taking these medications   Details  azithromycin (ZITHROMAX) 250 MG tablet Take 1 tablet (250 mg total) by mouth daily. Take first 2 tablets together, then 1 every day until finished., Starting Thu 09/23/2016, Print         Nat Christen, MD 09/23/16 [phone]

## 2016-09-23 NOTE — ED Notes (Signed)
Placed patient into a gown and on the monitor waiting for provider

## 2016-09-27 NOTE — Telephone Encounter (Signed)
Patient calls back today. She speaks very fast in broken [place]. I did let her know we would appeal the denial of Benicar for her. All info sent to [facility] Dept.

## 2016-09-29 DIAGNOSIS — D3A090 Benign carcinoid tumor of the bronchus and lung: Secondary | ICD-10-CM | POA: Diagnosis not present

## 2016-09-29 DIAGNOSIS — I10 Essential (primary) hypertension: Secondary | ICD-10-CM | POA: Diagnosis not present

## 2016-09-29 DIAGNOSIS — Z79899 Other long term (current) drug therapy: Secondary | ICD-10-CM | POA: Diagnosis not present

## 2016-09-29 DIAGNOSIS — J45991 Cough variant asthma: Secondary | ICD-10-CM | POA: Diagnosis not present

## 2016-09-29 DIAGNOSIS — E119 Type 2 diabetes mellitus without complications: Secondary | ICD-10-CM | POA: Diagnosis not present

## 2016-09-29 DIAGNOSIS — E785 Hyperlipidemia, unspecified: Secondary | ICD-10-CM | POA: Diagnosis not present

## 2016-09-29 DIAGNOSIS — K219 Gastro-esophageal reflux disease without esophagitis: Secondary | ICD-10-CM | POA: Diagnosis not present

## 2016-09-29 DIAGNOSIS — Z7982 Long term (current) use of aspirin: Secondary | ICD-10-CM | POA: Diagnosis not present

## 2016-09-29 DIAGNOSIS — Z7951 Long term (current) use of inhaled steroids: Secondary | ICD-10-CM | POA: Diagnosis not present

## 2016-09-30 DIAGNOSIS — M25551 Pain in right hip: Secondary | ICD-10-CM | POA: Diagnosis not present

## 2016-09-30 DIAGNOSIS — M1611 Unilateral primary osteoarthritis, right hip: Secondary | ICD-10-CM | POA: Diagnosis not present

## 2016-10-05 ENCOUNTER — Other Ambulatory Visit: Payer: Self-pay | Admitting: Orthopedic Surgery

## 2016-10-05 DIAGNOSIS — M1611 Unilateral primary osteoarthritis, right hip: Secondary | ICD-10-CM

## 2016-10-06 ENCOUNTER — Other Ambulatory Visit (HOSPITAL_BASED_OUTPATIENT_CLINIC_OR_DEPARTMENT_OTHER): Payer: Medicare Other

## 2016-10-06 ENCOUNTER — Other Ambulatory Visit: Payer: Self-pay | Admitting: Medical Oncology

## 2016-10-06 ENCOUNTER — Ambulatory Visit (HOSPITAL_COMMUNITY)
Admission: RE | Admit: 2016-10-06 | Discharge: 2016-10-06 | Disposition: A | Discharge status: Home / Self Care | Payer: Medicare Other | Source: Ambulatory Visit | Attending: Internal Medicine | Admitting: Internal Medicine

## 2016-10-06 DIAGNOSIS — L97911 Non-pressure chronic ulcer of unspecified part of right lower leg limited to breakdown of skin: Secondary | ICD-10-CM

## 2016-10-06 DIAGNOSIS — C7A090 Malignant carcinoid tumor of the bronchus and lung: Secondary | ICD-10-CM

## 2016-10-06 DIAGNOSIS — C3490 Malignant neoplasm of unspecified part of unspecified bronchus or lung: Secondary | ICD-10-CM | POA: Insufficient documentation

## 2016-10-06 DIAGNOSIS — I700 Atherosclerosis of aorta: Secondary | ICD-10-CM | POA: Diagnosis not present

## 2016-10-06 DIAGNOSIS — D3A00 Benign carcinoid tumor of unspecified site: Secondary | ICD-10-CM

## 2016-10-06 DIAGNOSIS — E1151 Type 2 diabetes mellitus with diabetic peripheral angiopathy without gangrene: Secondary | ICD-10-CM

## 2016-10-06 DIAGNOSIS — I509 Heart failure, unspecified: Secondary | ICD-10-CM | POA: Diagnosis not present

## 2016-10-06 LAB — CBC WITH DIFFERENTIAL/PLATELET
BASO%: 0.6 % (ref 0.0–2.0)
Basophils Absolute: 0 10*3/uL (ref 0.0–0.1)
EOS ABS: 0.1 10*3/uL (ref 0.0–0.5)
EOS%: 1 % (ref 0.0–7.0)
HCT: 39.7 % (ref 34.8–46.6)
HEMOGLOBIN: 12.9 g/dL (ref 11.6–15.9)
LYMPH%: 21 % (ref 14.0–49.7)
MCH: 29.2 pg (ref 25.1–34.0)
MCHC: 32.5 g/dL (ref 31.5–36.0)
MCV: 90 fL (ref 79.5–101.0)
MONO#: 0.6 10*3/uL (ref 0.1–0.9)
MONO%: 7.8 % (ref 0.0–14.0)
NEUT%: 69.6 % (ref 38.4–76.8)
NEUTROS ABS: 5 10*3/uL (ref 1.5–6.5)
Platelets: 256 10*3/uL (ref 145–400)
RBC: 4.42 10*6/uL (ref 3.70–5.45)
RDW: 14.8 % — AB (ref 11.2–14.5)
WBC: 7.1 10*3/uL (ref 3.9–10.3)
lymph#: 1.5 10*3/uL (ref 0.9–3.3)

## 2016-10-06 LAB — COMPREHENSIVE METABOLIC PANEL
ALBUMIN: 3.6 g/dL (ref 3.5–5.0)
ALK PHOS: 96 U/L (ref 40–150)
ALT: 20 U/L (ref 0–55)
AST: 25 U/L (ref 5–34)
Anion Gap: 8 mEq/L (ref 3–11)
BILIRUBIN TOTAL: 0.68 mg/dL (ref 0.20–1.20)
BUN: 17.8 mg/dL (ref 7.0–26.0)
CO2: 27 mEq/L (ref 22–29)
Calcium: 9.5 mg/dL (ref 8.4–10.4)
Chloride: 105 mEq/L (ref 98–109)
Creatinine: 0.9 mg/dL (ref 0.6–1.1)
EGFR: 59 mL/min/{1.73_m2} — ABNORMAL LOW (ref >90)
GLUCOSE: 121 mg/dL (ref 70–140)
Potassium: 4.7 mEq/L (ref 3.5–5.1)
SODIUM: 140 meq/L (ref 136–145)
TOTAL PROTEIN: 7.2 g/dL (ref 6.4–8.3)

## 2016-10-11 ENCOUNTER — Other Ambulatory Visit: Payer: Medicare Other

## 2016-10-13 ENCOUNTER — Telehealth: Payer: Self-pay | Admitting: Internal Medicine

## 2016-10-13 ENCOUNTER — Encounter: Payer: Self-pay | Admitting: Internal Medicine

## 2016-10-13 ENCOUNTER — Ambulatory Visit (HOSPITAL_BASED_OUTPATIENT_CLINIC_OR_DEPARTMENT_OTHER): Payer: Medicare Other | Admitting: Internal Medicine

## 2016-10-13 VITALS — BP 150/64 | HR 85 | Temp 97.9°F | Resp 18 | Ht 60.0 in | Wt 153.8 lb

## 2016-10-13 DIAGNOSIS — D3A00 Benign carcinoid tumor of unspecified site: Secondary | ICD-10-CM | POA: Diagnosis not present

## 2016-10-13 DIAGNOSIS — D508 Other iron deficiency anemias: Secondary | ICD-10-CM

## 2016-10-13 DIAGNOSIS — D509 Iron deficiency anemia, unspecified: Secondary | ICD-10-CM | POA: Insufficient documentation

## 2016-10-13 NOTE — Telephone Encounter (Signed)
Appointments scheduled per 10/13/16 los. A copy of the appointment schedule was given to the patient, per 10/13/16 los. Unable to give copy of AVS report due to technical difficulties.

## 2016-10-13 NOTE — Progress Notes (Signed)
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  PRIOR THERAPY: 1. Status post resection of multiple nodules from the left lung under the care of Dr Arlyce Dice on July 15, 2005. 2. Status post repeat bronchoscopy with endobronchial excision of the right upper lobe tumor and laser bronchoscopy under the care of Dr Arlyce Dice on October 08, 2009.  CURRENT THERAPY: Observation.  INTERVAL HISTORY: [name] 82 y.o. female returns to the clinic today for annual follow-up visit accompanied by her daughter. The patient is feeling fine today with no specific complaints except for right hip pain. She is followed by Dr. Alvan Dame and received steroid injection with little improvement. She is also on tramadol as needed for pain. She is otherwise feeling fine with no specific complaints except for mild dry cough. She is followed by a pulmonologist at [facility] She has no chest pain, shortness breath or hemoptysis. She has no nausea, vomiting, diarrhea or constipation. She had repeat blood work and chest x-ray recently and she is here for evaluation and discussion of her lab and x-ray results.  MEDICAL HISTORY: Past Medical History:  Diagnosis Date  . Ascending aortic aneurysm ([place])    a. 08/2015 stable 5 cm Asc Ao Aneurysm.  . Bronchitis   . Carcinoid tumor   . COPD (chronic obstructive pulmonary disease) ([place])   . Coronary artery disease    a. Ca2+ noted on CT - no other clear documentation of CAD.  Marland Kitchen Difficulty in walking(719.7)   . Disturbance of skin sensation   . Edema   . Essential hypertension   . GERD (gastroesophageal reflux disease)   . Hypercholesterolemia   . Hyperlipidemia   . Mitral valve disease    a. 10/2015 Echo: Mod MS, mild MR.  . Moderate aortic insufficiency    a. 10/2015 Echo:  EF 50-55%, no rwma, mod AI, mod MS, mild MR, sev dil LA, mild TR/PR, PASP 27mHg.  . Pain in joint, forearm   . Pain in joint, hand   . Pain in joint, lower leg   . Pulmonary nodules    s. 08/2015 CT: stable lung nodules.  . Type II diabetes mellitus ([place]     ALLERGIES:  has No Known Allergies.  MEDICATIONS:  Current Outpatient Prescriptions  Medication Sig Dispense Refill  . albuterol (PROVENTIL HFA;VENTOLIN HFA) 108 (90 Base) MCG/ACT inhaler Inhale 2 puffs into the lungs every 6 (six) hours as needed for wheezing or shortness of breath.    .Marland KitchenamLODipine (NORVASC) 10 MG tablet TAKE 1 TABLET (10 MG TOTAL) BY MOUTH DAILY. 30 tablet 6  . aspirin EC 81 MG tablet Take 81 mg by mouth every evening. 7 pm    . atorvastatin (LIPITOR) 10 MG tablet Take 10 mg by mouth at bedtime.  12  . FeFum-FePoly-FA-B Cmp-C-Biot (INTEGRA PLUS) CAPS TAKE ONE CAPSULE BY MOUTH EVERY DAY *NOT COVERED* 30 capsule 5  . fluticasone (FLOVENT HFA) 110 MCG/ACT inhaler Inhale 2 puffs into the lungs 2 (two) times daily.    . furosemide (LASIX) 20 MG tablet Take 1 tablet (20 mg total) by mouth daily as needed for fluid or edema. 30 tablet 11  . glimepiride (AMARYL) 1 MG tablet Take 1 mg by mouth daily.     . hydrALAZINE (APRESOLINE) 50 MG tablet Take 1 tablet (50  mg total) by mouth 3 (three) times daily. 270 tablet 3  . montelukast (SINGULAIR) 10 MG tablet Take 10 mg by mouth at bedtime.  1  . Multiple Vitamin (MULITIVITAMIN WITH MINERALS) TABS Take 1 tablet by mouth daily.    Marland Kitchen olmesartan (BENICAR) 40 MG tablet Take 1 tablet (40 mg total) by mouth daily. 60 tablet 11  . Omega-3 Fatty Acids (FISH OIL) 1200 MG CAPS Take 1 capsule by mouth daily.    Marland Kitchen omeprazole (PRILOSEC) 20 MG capsule Take 20 mg by mouth daily before breakfast.  5  . pioglitazone (ACTOS) 15 MG tablet Take 7.5 mg by mouth daily.     . potassium chloride (K-DUR,KLOR-CON) 10 MEQ tablet Take 20 mEq by mouth daily.    . Probiotic Product (PROBIOTIC PO) Take 1  tablet by mouth daily.    Marland Kitchen Spacer/Aero-Holding Chambers (AEROCHAMBER MINI CHAMBER) DEVI use as directed with your inhalers     . traMADol (ULTRAM) 50 MG tablet Take 1 tablet (50 mg total) by mouth every 6 (six) hours as needed. 120 tablet 5  . nitroGLYCERIN (NITROSTAT) 0.4 MG SL tablet PLACE 1 TABLET UNDER THE TONGUE EVERY 5 MINUTES AS NEEDED FOR CHEST PAIN (Patient not taking: Reported on 10/13/2016) 25 tablet 2   No current facility-administered medications for this visit.     SURGICAL HISTORY:  Past Surgical History:  Procedure Laterality Date  . bronch with endobronchial biopies  09/15/2009  . COLONOSCOPY N/A 09/13/2013   Procedure: COLONOSCOPY;  Surgeon: Jeryl Columbia, MD;  Location: [facility] END[facility]COPY;  Service: Endoscopy;  Laterality: N/A;  . endobronchial excision of right upper lobe tumor with laser bronchi  10/06/2009  . fiberoptic bronch with endobronchial u/s  07/02/2010  . PARS PLANA VITRECTOMY     right eye  . PARS PLANA VITRECTOMY W/ ENDOPHOTOCOAGULATION     right eye  . posterior capsulectomy     right eye  . VIDEO BRONCHOSCOPY  12/09/2009    REVIEW OF SYSTEMS:  A comprehensive review of systems was negative except for: Respiratory: positive for cough Musculoskeletal: positive for bone pain   PHYSICAL EXAMINATION: General appearance: alert, cooperative and no distress Head: Normocephalic, without obvious abnormality, atraumatic Neck: no adenopathy, no JVD, supple, symmetrical, trachea midline and thyroid not enlarged, symmetric, no tenderness/mass/nodules Lymph nodes: Cervical, supraclavicular, and axillary nodes normal. Resp: clear to auscultation bilaterally Back: symmetric, no curvature. ROM normal. No CVA tenderness. Cardio: regular rate and rhythm, S1, S2 normal, no murmur, click, rub or gallop GI: soft, non-tender; bowel sounds normal; no masses,  no organomegaly Extremities: extremities normal, atraumatic, no cyanosis or edema  ECOG PERFORMANCE STATUS: 1 -  Symptomatic but completely ambulatory  Blood pressure (!) 150/64, pulse 85, temperature 97.9 F (36.6 C), temperature source Oral, resp. rate 18, height 5' (1.524 m), weight 153 lb 12.8 oz (69.8 kg), SpO2 95 %.  LABORATORY DATA: Lab Results  Component Value Date   WBC 7.1 10/06/2016   HGB 12.9 10/06/2016   HCT 39.7 10/06/2016   MCV 90.0 10/06/2016   PLT 256 10/06/2016      Chemistry      Component Value Date/Time   NA 140 10/06/2016 1328   K 4.7 10/06/2016 1328   CL 102 09/23/2016 1456   CL 104 07/07/2012 1435   CO2 27 10/06/2016 1328   BUN 17.8 10/06/2016 1328   CREATININE 0.9 10/06/2016 1328      Component Value Date/Time   CALCIUM 9.5 10/06/2016 1328   ALKPHOS 96 10/06/2016  1328   AST 25 10/06/2016 1328   ALT 20 10/06/2016 1328   BILITOT 0.68 10/06/2016 1328       RADIOGRAPHIC STUDIES: Dg Chest 1 View  Result Date: 10/06/2016 CLINICAL DATA:  Follow-up of suspected pneumonia following antibiotic treatment. No current symptoms. History of lung malignancy, diabetes, coronary artery disease with CHF. EXAM: CHEST 1 VIEW COMPARISON:  PA and lateral chest x-ray dated September 23, 2016. FINDINGS: The lungs are adequately inflated. The interstitial markings have decreased in conspicuity. There is no alveolar infiltrate. The cardiac silhouette remains enlarged. The pulmonary vascularity is not engorged. There is calcification in the wall of the thoracic aorta. IMPRESSION: Decreased prominence of the pulmonary interstitium bilaterally may reflect response to the previous antibiotic therapy. There is no alveolar pneumonia nor pulmonary edema currently. Thoracic aortic atherosclerosis. Electronically Signed   By: David  Martinique M.D.   On: 10/06/2016 15:25   Dg Chest 2 View  Result Date: 09/23/2016 CLINICAL DATA:  Ongoing cough for 1 week EXAM: CHEST  2 VIEW COMPARISON:  Chest CT 09/15/2015 FINDINGS: Chronic cardiomegaly. Tortuous aorta that is aneurysmal by 2016 chest CT. No gross  change in mediastinal contours from prior. Chronic numerous bilateral pulmonary nodules without detectable change. There is no edema, consolidation, effusion, or pneumothorax. Advanced thoracic spine degeneration without acute or aggressive finding. IMPRESSION: Chronic findings without detectable change from 2016. No evidence of acute disease. Electronically Signed   By: Monte Fantasia M.D.   On: 09/23/2016 15:18    ASSESSMENT AND PLAN: This is a very pleasant 82 years old white female with history of carcinoid tumor of the lung status post resection in 2006. The patient is feeling fine and the recent chest x-ray showed no clear evidence for progression. Her lab work is also unremarkable for any significant anemia. I recommended for the patient to continue on observation with repeat chest x-ray, CBC and comprehensive metabolic panel in one year. She was advised to call immediately if she has any concerning symptoms in the interval. For the hip pain she will continue with her current pain medication and follow-up with Dr. Alvan Dame. The patient voices understanding of current disease status and treatment options and is in agreement with the current care plan.  All questions were answered. The patient knows to call the clinic with any problems, questions or concerns. We can certainly see the patient much sooner if necessary.  I spent 10 minutes counseling the patient face to face. The total time spent in the appointment was 15 minutes.  Disclaimer: This note was dictated with voice recognition software. Similar sounding words can inadvertently be transcribed and may not be corrected upon review.

## 2016-10-15 ENCOUNTER — Telehealth: Payer: Self-pay | Admitting: Cardiovascular Disease

## 2016-10-15 ENCOUNTER — Other Ambulatory Visit: Payer: Self-pay | Admitting: Cardiovascular Disease

## 2016-10-15 NOTE — Telephone Encounter (Signed)
New Message   *STAT* If patient is at the pharmacy, call can be transferred to refill team.   1. Which medications need to be refilled? (please list name of each medication and dose if known)  hydralazine apresoline 50 mg tablet three time daily totaling 50 mg furosemide lasix 20 mg tablet once daily  2. Which pharmacy/location (including street and city if local pharmacy) is medication to be sent to? [facility], [address] [place], [place]  3. Do they need a 30 day or 90 day supply? 30 day supply

## 2016-10-15 NOTE — Telephone Encounter (Signed)
Hydralazine was sent on 09/15/16 Furosemide sent to [facility] today.

## 2016-10-19 ENCOUNTER — Ambulatory Visit
Admission: RE | Admit: 2016-10-19 | Discharge: 2016-10-19 | Disposition: A | Discharge status: Home / Self Care | Payer: Medicare Other | Source: Ambulatory Visit | Attending: Orthopedic Surgery | Admitting: Orthopedic Surgery

## 2016-10-19 DIAGNOSIS — M1611 Unilateral primary osteoarthritis, right hip: Secondary | ICD-10-CM | POA: Diagnosis not present

## 2016-10-19 MED ORDER — METHYLPREDNISOLONE ACETATE 40 MG/ML INJ SUSP (RADIOLOG
120.0000 mg | Freq: Once | INTRAMUSCULAR | Status: AC
  Administered 2016-10-19: 120 mg via INTRA_ARTICULAR

## 2016-10-19 MED ORDER — IOPAMIDOL (ISOVUE-M 200) INJECTION 41%
10.0000 mL | Freq: Once | INTRAMUSCULAR | Status: AC
  Administered 2016-10-19: 10 mL via INTRA_ARTICULAR

## 2016-10-20 ENCOUNTER — Telehealth: Payer: Self-pay | Admitting: *Deleted

## 2016-10-20 ENCOUNTER — Other Ambulatory Visit: Payer: Self-pay | Admitting: *Deleted

## 2016-10-20 MED ORDER — OLMESARTAN MEDOXOMIL 40 MG PO TABS: ORAL_TABLET | ORAL | 3 refills | Status: DC

## 2016-10-20 NOTE — Telephone Encounter (Signed)
Error

## 2016-10-20 NOTE — Telephone Encounter (Signed)
called to have the pharmacy fil tyhe benicar and call pt to let them know it was done, spoke to myra they have the correct RX, will fill and call pt.

## 2016-10-20 NOTE — Telephone Encounter (Signed)
TO MANY REFILLS WERE SENT IN ON THIS PT AND CAUSED A MIX UP OF INSTRUCTIONS, I HAVE CORRECTED THE ISSUE AND ALSO MADE NOTE TO THE PHARMACY THAT THE PT WANTS BRAND NAME ONLY.

## 2016-11-15 ENCOUNTER — Ambulatory Visit: Payer: Medicare Other | Admitting: Podiatry

## 2016-11-22 ENCOUNTER — Encounter: Payer: Self-pay | Admitting: Podiatry

## 2016-11-22 ENCOUNTER — Ambulatory Visit (INDEPENDENT_AMBULATORY_CARE_PROVIDER_SITE_OTHER): Payer: Medicare Other | Admitting: Podiatry

## 2016-11-22 DIAGNOSIS — L84 Corns and callosities: Secondary | ICD-10-CM | POA: Diagnosis not present

## 2016-11-22 DIAGNOSIS — B351 Tinea unguium: Secondary | ICD-10-CM

## 2016-11-22 DIAGNOSIS — M79674 Pain in right toe(s): Secondary | ICD-10-CM | POA: Diagnosis not present

## 2016-11-22 DIAGNOSIS — M79675 Pain in left toe(s): Secondary | ICD-10-CM | POA: Diagnosis not present

## 2016-11-22 DIAGNOSIS — E119 Type 2 diabetes mellitus without complications: Secondary | ICD-10-CM

## 2016-11-22 NOTE — Progress Notes (Signed)
Subjective: 83 y.o. returns the office today for painful, elongated, thickened toenails which she cannot trim herself. She also has a history of ulcer on the right leg ulcer but denies any skin break down today. She has a callus on the left big toe. Denies any redness or drainage around the nails. Denies any acute changes since last appointment and no new complaints today. Denies any systemic complaints such as fevers, chills, nausea, vomiting.   Objective: AAO 3, NAD DP/PT pulses palpable, CRT less than 3 seconds Nails hypertrophic, dystrophic, elongated, brittle, discolored 10. There is tenderness overlying the nails 1-5 bilaterally. There is no surrounding erythema or drainage along the nail sites. Healed ulcer right distal leg. No skin breakdown today Hyperkeratotic lesion left hallux. No underlying ulcer, drainage, signs of infection.  No open lesions or pre-ulcerative lesions are identified. No other areas of tenderness bilateral lower extremities. No overlying edema, erythema, increased warmth. No pain with calf compression, swelling, warmth, erythema.  Assessment: Patient presents with symptomatic onychomycosis; hyperkeratotic lesion x 1  Plan: -Treatment options including alternatives, risks, complications were discussed -Nails sharply debrided 10 without complication/bleeding. -Hyperkeratotic lesion sharply debrided x 1 without complications or bleeding.  -Discussed daily foot inspection. If there are any changes, to call the office immediately.  -Follow-up in 3 months or sooner if any problems are to arise. In the meantime, encouraged to call the office with any questions, concerns, changes symptoms.  Celesta Gentile, DPM

## 2016-12-02 ENCOUNTER — Emergency Department (HOSPITAL_COMMUNITY)
Admission: EM | Admit: 2016-12-02 | Discharge: 2016-12-02 | Disposition: A | Discharge status: Home / Self Care | Payer: Medicare Other | Attending: Emergency Medicine | Admitting: Emergency Medicine

## 2016-12-02 ENCOUNTER — Encounter (HOSPITAL_COMMUNITY): Payer: Self-pay | Admitting: Emergency Medicine

## 2016-12-02 ENCOUNTER — Emergency Department (HOSPITAL_COMMUNITY): Payer: Medicare Other

## 2016-12-02 DIAGNOSIS — I2510 Atherosclerotic heart disease of native coronary artery without angina pectoris: Secondary | ICD-10-CM | POA: Insufficient documentation

## 2016-12-02 DIAGNOSIS — Z7982 Long term (current) use of aspirin: Secondary | ICD-10-CM | POA: Insufficient documentation

## 2016-12-02 DIAGNOSIS — J441 Chronic obstructive pulmonary disease with (acute) exacerbation: Secondary | ICD-10-CM | POA: Insufficient documentation

## 2016-12-02 DIAGNOSIS — E119 Type 2 diabetes mellitus without complications: Secondary | ICD-10-CM | POA: Diagnosis not present

## 2016-12-02 DIAGNOSIS — R05 Cough: Secondary | ICD-10-CM | POA: Diagnosis not present

## 2016-12-02 DIAGNOSIS — Z79899 Other long term (current) drug therapy: Secondary | ICD-10-CM | POA: Insufficient documentation

## 2016-12-02 DIAGNOSIS — Z7984 Long term (current) use of oral hypoglycemic drugs: Secondary | ICD-10-CM | POA: Insufficient documentation

## 2016-12-02 DIAGNOSIS — I5032 Chronic diastolic (congestive) heart failure: Secondary | ICD-10-CM | POA: Diagnosis not present

## 2016-12-02 DIAGNOSIS — I110 Hypertensive heart disease with heart failure: Secondary | ICD-10-CM | POA: Insufficient documentation

## 2016-12-02 MED ORDER — DOXYCYCLINE HYCLATE 100 MG PO CAPS: 100.0000 mg | ORAL_CAPSULE | Freq: Every day | ORAL | 0 refills | Status: AC

## 2016-12-02 MED ORDER — ALBUTEROL SULFATE HFA 108 (90 BASE) MCG/ACT IN AERS: 1.0000 | INHALATION_SPRAY | Freq: Four times a day (QID) | RESPIRATORY_TRACT | 0 refills | Status: AC | PRN

## 2016-12-02 MED ORDER — PREDNISONE 20 MG PO TABS
60.0000 mg | ORAL_TABLET | Freq: Once | ORAL | Status: AC
  Administered 2016-12-02: 60 mg via ORAL
  Filled 2016-12-02: qty 3

## 2016-12-02 MED ORDER — ALBUTEROL SULFATE HFA 108 (90 BASE) MCG/ACT IN AERS
2.0000 | INHALATION_SPRAY | Freq: Once | RESPIRATORY_TRACT | Status: AC
  Administered 2016-12-02: 2 via RESPIRATORY_TRACT
  Filled 2016-12-02: qty 6.7

## 2016-12-02 MED ORDER — PREDNISONE 20 MG PO TABS: 40.0000 mg | ORAL_TABLET | Freq: Every day | ORAL | 0 refills | Status: AC

## 2016-12-02 MED ORDER — IPRATROPIUM-ALBUTEROL 0.5-2.5 (3) MG/3ML IN SOLN
3.0000 mL | Freq: Once | RESPIRATORY_TRACT | Status: DC
  Filled 2016-12-02: qty 3

## 2016-12-02 MED ORDER — DOXYCYCLINE HYCLATE 100 MG PO TABS
100.0000 mg | ORAL_TABLET | Freq: Once | ORAL | Status: AC
  Administered 2016-12-02: 100 mg via ORAL
  Filled 2016-12-02: qty 1

## 2016-12-02 NOTE — ED Notes (Signed)
Patient verbalized understanding of discharge instructions and denies any further needs or questions at this time. VS stable. Patient ambulatory with steady gait.  

## 2016-12-02 NOTE — ED Notes (Signed)
Handed out warm blankets and offered explanations for delays in lobby.

## 2016-12-02 NOTE — ED Triage Notes (Signed)
Pt here for cough x 3 days

## 2016-12-02 NOTE — Discharge Instructions (Signed)
We believe that your symptoms are caused today by an exacerbation of your COPD, and possibly bronchitis.  Please take the prescribed medications and any medications that you have at home for your COPD.  Follow up with your doctor as recommended.  If you develop any new or worsening symptoms, including but not limited to fever, persistent vomiting, worsening shortness of breath, or other symptoms that concern you, please return to the Emergency Department immediately. ° ° °Chronic Obstructive Pulmonary Disease °Chronic obstructive pulmonary disease (COPD) is a common lung condition in which airflow from the lungs is limited. COPD is a general term that can be used to describe many different lung problems that limit airflow, including both chronic bronchitis and emphysema.  If you have COPD, your lung function will probably never return to normal, but there are measures you can take to improve lung function and make yourself feel better.  °CAUSES  °Smoking (common).   °Exposure to secondhand smoke.   °Genetic problems. °Chronic inflammatory lung diseases or recurrent infections. °SYMPTOMS  °Shortness of breath, especially with physical activity.   °Deep, persistent (chronic) cough with a large amount of thick mucus.   °Wheezing.   °Rapid breaths (tachypnea).   °Gray or bluish discoloration (cyanosis) of the skin, especially in fingers, toes, or lips.   °Fatigue.   °Weight loss.   °Frequent infections or episodes when breathing symptoms become much worse (exacerbations).   °Chest tightness. °DIAGNOSIS  °Your health care provider will take a medical history and perform a physical examination to make the initial diagnosis.  Additional tests for COPD may include:  °Lung (pulmonary) function tests. °Chest X-ray. °CT scan. °Blood tests. °TREATMENT  °Treatment available to help you feel better when you have COPD includes:  °Inhaler and nebulizer medicines. These help manage the symptoms of COPD and make your breathing more  comfortable. °Supplemental oxygen. Supplemental oxygen is only helpful if you have a low oxygen level in your blood.   °Exercise and physical activity. These are beneficial for nearly all people with COPD. Some people may also benefit from a pulmonary rehabilitation program. °HOME CARE INSTRUCTIONS  °Take all medicines (inhaled or pills) as directed by your health care provider. °Avoid over-the-counter medicines or cough syrups that dry up your airway (such as antihistamines) and slow down the elimination of secretions unless instructed otherwise by your health care provider.   °If you are a smoker, the most important thing that you can do is stop smoking. Continuing to smoke will cause further lung damage and breathing trouble. Ask your health care provider for help with quitting smoking. He or she can direct you to community resources or hospitals that provide support. °Avoid exposure to irritants such as smoke, chemicals, and fumes that aggravate your breathing. °Use oxygen therapy and pulmonary rehabilitation if directed by your health care provider. If you require home oxygen therapy, ask your health care provider whether you should purchase a pulse oximeter to measure your oxygen level at home.   °Avoid contact with individuals who have a contagious illness. °Avoid extreme temperature and humidity changes. °Eat healthy foods. Eating smaller, more frequent meals and resting before meals may help you maintain your strength. °Stay active, but balance activity with periods of rest. Exercise and physical activity will help you maintain your ability to do things you want to do. °Preventing infection and hospitalization is very important when you have COPD. Make sure to receive all the vaccines your health care provider recommends, especially the pneumococcal and influenza   vaccines. Ask your health care provider whether you need a pneumonia vaccine. °Learn and use relaxation techniques to manage stress. °Learn and  use controlled breathing techniques as directed by your health care provider. Controlled breathing techniques include:   °Pursed lip breathing. Start by breathing in (inhaling) through your nose for 1 second. Then, purse your lips as if you were going to whistle and breathe out (exhale) through the pursed lips for 2 seconds.   °Diaphragmatic breathing. Start by putting one hand on your abdomen just above your waist. Inhale slowly through your nose. The hand on your abdomen should move out. Then purse your lips and exhale slowly. You should be able to feel the hand on your abdomen moving in as you exhale.   °Learn and use controlled coughing to clear mucus from your lungs. Controlled coughing is a series of short, progressive coughs. The steps of controlled coughing are:   °Lean your head slightly forward.   °Breathe in deeply using diaphragmatic breathing.   °Try to hold your breath for 3 seconds.   °Keep your mouth slightly open while coughing twice.   °Spit any mucus out into a tissue.   °Rest and repeat the steps once or twice as needed. °SEEK MEDICAL CARE IF:  °You are coughing up more mucus than usual.   °There is a change in the color or thickness of your mucus.   °Your breathing is more labored than usual.   °Your breathing is faster than usual.   °SEEK IMMEDIATE MEDICAL CARE IF:  °You have shortness of breath while you are resting.   °You have shortness of breath that prevents you from: °Being able to talk.   °Performing your usual physical activities.   °You have chest pain lasting longer than 5 minutes.   °Your skin color is more cyanotic than usual. °You measure low oxygen saturations for longer than 5 minutes with a pulse oximeter. °MAKE SURE YOU:  °Understand these instructions. °Will watch your condition. °Will get help right away if you are not doing well or get worse. °Document Released: 07/21/2005 Document Revised: 02/25/2014 Document Reviewed: 06/07/2013 °[employer]® Patient Information ©2015  [employer]. This information is not intended to replace advice given to you by your health care provider. Make sure you discuss any questions you have with your health care provider. ° °

## 2016-12-02 NOTE — ED Notes (Signed)
RN went into room to introduce self and initiate breathing treatment. Pt's family member raising voice and asking for the doctor, stating he had not been in. I explained that the PA-C had been in and family member continues to yell and state that they do not want to do the treatment at this time.

## 2016-12-02 NOTE — ED Provider Notes (Signed)
[place] DEPT Provider Note   CSN: [id number] Arrival date & time: 12/02/16  1424     History   Chief Complaint Chief Complaint  Patient presents with  . Cough    HPI [name] is a 83 y.o. female.  HPI   Patient is an 83 year old female with history of diabetes, hypertension, CAD and COPD who presents the ED with complaint of cough. Patient reports over the past week she has had a worsening productive cough with white sputum. Endorses associated chills, generalized body ache, nasal congestion, rhinorrhea, mild intermittent abdominal pain. Denies taking any medications for her symptoms. Denies fever, sore throat, hemoptysis, shortness of breath, chest pain, nausea, vomiting, diarrhea, urinary symptoms, leg swelling, rash. Denies any known sick contacts. She reports she has been using her inhaler at home without relief.  Past Medical History:  Diagnosis Date  . Ascending aortic aneurysm ([place])    a. 08/2015 stable 5 cm Asc Ao Aneurysm.  . Bronchitis   . Carcinoid tumor   . COPD (chronic obstructive pulmonary disease) ([place])   . Coronary artery disease    a. Ca2+ noted on CT - no other clear documentation of CAD.  Marland Kitchen Difficulty in walking(719.7)   . Disturbance of skin sensation   . Edema   . Essential hypertension   . GERD (gastroesophageal reflux disease)   . Hypercholesterolemia   . Hyperlipidemia   . Mitral valve disease    a. 10/2015 Echo: Mod MS, mild MR.  . Moderate aortic insufficiency    a. 10/2015 Echo: EF 50-55%, no rwma, mod AI, mod MS, mild MR, sev dil LA, mild TR/PR, PASP 11mHg.  . Pain in joint, forearm   . Pain in joint, hand   . Pain in joint, lower leg   . Pulmonary nodules    s. 08/2015 CT: stable lung nodules.  . Type II diabetes mellitus ([facility]     Patient Active Problem List   Diagnosis Date Noted  . Iron deficiency anemia 10/13/2016  . Venous ulcer of right leg ([place] 03/16/2016  . Type II diabetes mellitus (HB[place]   . Ascending aortic  aneurysm ([place]   . Primary osteoarthritis of both hands 01/23/2016  . Varicose veins of right lower extremity with complications 055/73/2202 . Open wound of lower leg 08/26/2015  . Venous ulcer 08/26/2015  . Lymphedema of leg 07/23/2015  . Varicose veins of lower extremities with ulcer and inflammation (HB[place] 07/23/2015  . Chronic venous insufficiency 07/23/2015  . Leg ulcer ([place] 07/16/2015  . Ulcer of right lower leg ([facility] 07/12/2015  . Cellulitis of right leg 07/12/2015  . Hyponatremia 07/12/2015  . Chronic diastolic heart failure (HP[place] 07/12/2015  . Right hip pain 01/07/2015  . Primary osteoarthritis of right hip 01/07/2015  . Hip contracture 01/07/2015  . AI (aortic incompetence) 08/07/2014  . Diastolic murmur 154/27/0623 . Asthma, mild intermittent 04/04/2013  . Leg varices 03/09/2013  . Asthma, cough variant 10/04/2012  . Eczema intertrigo 05/11/2012  . Basal cell papilloma 05/11/2012  . Carcinoid tumor 10/20/2011  . Allergic rhinitis 07/23/2011  . Airway hyperreactivity 07/23/2011  . Chronic cough 07/23/2011  . Diabetes mellitus type 2 with peripheral artery disease (HF[place] 06/19/2009  . HYPERCHOLESTEROLEMIA 06/19/2009  . HYPERLIPIDEMIA 06/19/2009  . Essential hypertension 06/19/2009  . Coronary atherosclerosis 06/19/2009  . Aortic valve regurgitation 06/19/2009  . AORTIC ANEURYSM 06/19/2009  . BRONCHITIS 06/19/2009  . COPD (chronic obstructive pulmonary disease) (HP[place] 06/19/2009  . GERD 06/19/2009  . EDEMA 06/19/2009  .  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 bronch with endobronchial biopies  09/15/2009  . COLONOSCOPY N/A 09/13/2013   Procedure: COLONOSCOPY;  Surgeon: Jeryl Columbia, MD;  Location: [facility];  Service: Endoscopy;  Laterality: N/A;  . endobronchial excision of right upper lobe tumor with laser bronchi  10/06/2009  . fiberoptic bronch with endobronchial u/s  07/02/2010  . PARS PLANA  VITRECTOMY     right eye  . PARS PLANA VITRECTOMY W/ ENDOPHOTOCOAGULATION     right eye  . posterior capsulectomy     right eye  . VIDEO BRONCHOSCOPY  12/09/2009    OB History    Gravida Para Term Preterm AB Living   '2 1 1   1 1   '$ SAB TAB Ectopic Multiple Live Births   1       1       Home Medications    Prior to Admission medications   Medication Sig Start Date End Date Taking? Authorizing Provider  albuterol (PROVENTIL HFA;VENTOLIN HFA) 108 (90 Base) MCG/ACT inhaler Inhale 1-2 puffs into the lungs every 6 (six) hours as needed for wheezing or shortness of breath. 12/02/16   Margette Fast, MD  amLODipine (NORVASC) 10 MG tablet TAKE 1 TABLET (10 MG TOTAL) BY MOUTH DAILY. 06/24/16   Josue Hector, MD  aspirin EC 81 MG tablet Take 81 mg by mouth every evening. 7 pm    Historical Provider, MD  atorvastatin (LIPITOR) 10 MG tablet Take 10 mg by mouth at bedtime. 06/17/15   Historical Provider, MD  doxycycline (VIBRAMYCIN) 100 MG capsule Take 1 capsule (100 mg total) by mouth daily. 12/02/16 12/09/16  Margette Fast, MD  FeFum-FePoly-FA-B Cmp-C-Biot (INTEGRA PLUS) CAPS TAKE ONE CAPSULE BY MOUTH EVERY DAY *NOT COVERED* 07/12/16   Curt Bears, MD  fluticasone (FLOVENT HFA) 110 MCG/ACT inhaler Inhale 2 puffs into the lungs 2 (two) times daily.    Historical Provider, MD  furosemide (LASIX) 20 MG tablet TAKE 1 TABLET (20 MG TOTAL) BY MOUTH DAILY AS NEEDED FOR FLUID OR EDEMA. 10/15/16   Josue Hector, MD  glimepiride (AMARYL) 1 MG tablet Take 1 mg by mouth daily.     Historical Provider, MD  hydrALAZINE (APRESOLINE) 50 MG tablet Take 1 tablet (50 mg total) by mouth 3 (three) times daily. 09/15/16   Josue Hector, MD  montelukast (SINGULAIR) 10 MG tablet Take 10 mg by mouth at bedtime. 06/04/15   Historical Provider, MD  Multiple Vitamin (MULITIVITAMIN WITH MINERALS) TABS Take 1 tablet by mouth daily.    Historical Provider, MD  nitroGLYCERIN (NITROSTAT) 0.4 MG SL tablet PLACE 1 TABLET UNDER THE  TONGUE EVERY 5 MINUTES AS NEEDED FOR CHEST PAIN Patient not taking: Reported on 10/13/2016 09/14/16   Josue Hector, MD  olmesartan (BENICAR) 40 MG tablet TAKE 1 TABLET (40 MG TOTAL) BY MOUTH 2 (TWO) TIMES DAILY. 10/20/16   Josue Hector, MD  Omega-3 Fatty Acids (FISH OIL) 1200 MG CAPS Take 1 capsule by mouth daily.    Historical Provider, MD  omeprazole (PRILOSEC) 20 MG capsule Take 20 mg by mouth daily before breakfast. 06/04/15   Historical Provider, MD  pioglitazone (ACTOS) 15 MG tablet Take 7.5 mg by mouth daily.     Historical Provider, MD  potassium chloride (K-DUR,KLOR-CON) 10 MEQ tablet Take 20 mEq by mouth daily.    Historical Provider, MD  predniSONE (DELTASONE) 20 MG tablet Take 2 tablets (  40 mg total) by mouth daily. 12/02/16 12/07/16  Margette Fast, MD  Probiotic Product (PROBIOTIC PO) Take 1 tablet by mouth daily.    Historical Provider, MD  Spacer/Aero-Holding Chambers (AEROCHAMBER MINI CHAMBER) DEVI use as directed with your inhalers  04/27/11   Historical Provider, MD  traMADol (ULTRAM) 50 MG tablet Take 1 tablet (50 mg total) by mouth every 6 (six) hours as needed. 07/19/16   Charlett Blake, MD    Family History Family History  Problem Relation Age of Onset  . Hypertension Mother   . Hypertension Father   . Heart attack Neg Hx   . Stroke Neg Hx     Social History Social History  Substance Use Topics  . Smoking status: Never Smoker  . Smokeless tobacco: Never Used  . Alcohol use No     Allergies   Patient has no known allergies.   Review of Systems Review of Systems  Constitutional: Positive for chills.  HENT: Positive for congestion and rhinorrhea.   Respiratory: Positive for cough.   Gastrointestinal: Positive for abdominal pain.  Musculoskeletal: Positive for myalgias (generalized).  All other systems reviewed and are negative.    Physical Exam Updated Vital Signs BP 135/68 (BP Location: Right Arm)   Pulse 86   Temp 98.7 F (37.1 C) (Oral)    Resp 20   SpO2 95%   Physical Exam  Constitutional: She is oriented to person, place, and time. She appears well-developed and well-nourished. No distress.  HENT:  Head: Normocephalic and atraumatic.  Mouth/Throat: Uvula is midline, oropharynx is clear and moist and mucous membranes are normal. No oropharyngeal exudate, posterior oropharyngeal edema, posterior oropharyngeal erythema or tonsillar abscesses. No tonsillar exudate.  Eyes: Conjunctivae and EOM are normal. Right eye exhibits no discharge. Left eye exhibits no discharge. No scleral icterus.  Neck: Normal range of motion. Neck supple.  Cardiovascular: Normal rate, regular rhythm, normal heart sounds and intact distal pulses.   Pulmonary/Chest: Effort normal. No respiratory distress. She has wheezes. She has no rales. She exhibits no tenderness.  Mild end expiratory wheezes noted and bibasilar lobes.  Abdominal: Soft. Bowel sounds are normal. She exhibits no distension and no mass. There is no tenderness. There is no rebound and no guarding. No hernia.  Musculoskeletal: Normal range of motion. She exhibits no edema.  Lymphadenopathy:    She has no cervical adenopathy.  Neurological: She is alert and oriented to person, place, and time.  Skin: Skin is warm and dry. She is not diaphoretic.  Nursing note and vitals reviewed.    ED Treatments / Results  Labs (all labs ordered are listed, but only abnormal results are displayed) Labs Reviewed - No data to display  EKG  EKG Interpretation None       Radiology Dg Chest 2 View  Result Date: 12/02/2016 CLINICAL DATA:  Cough EXAM: CHEST  2 VIEW COMPARISON:  Chest radiograph 10/06/2016 FINDINGS: The cardiomediastinal silhouette remains enlarged, unchanged. There is atherosclerotic calcification in the aortic arch. Suspect mild/early interstitial edema. No pleural effusion or pneumothorax. No focal consolidation. IMPRESSION: 1. Cardiomegaly and suspected mild/early interstitial  pulmonary edema. 2. Aortic atherosclerosis. Electronically Signed   By: Ulyses Jarred M.D.   On: 12/02/2016 16:04    Procedures Procedures (including critical care time)  Medications Ordered in ED Medications  ipratropium-albuterol (DUONEB) 0.5-2.5 (3) MG/3ML nebulizer solution 3 mL (not administered)  predniSONE (DELTASONE) tablet 60 mg (not administered)  doxycycline (VIBRA-TABS) tablet 100 mg (not administered)  albuterol (PROVENTIL  HFA;VENTOLIN HFA) 108 (90 Base) MCG/ACT inhaler 2 puff (not administered)     Initial Impression / Assessment and Plan / ED Course  I have reviewed the triage vital signs and the nursing notes.  Pertinent labs & imaging results that were available during my care of the patient were reviewed by me and considered in my medical decision making (see chart for details).     Patient presented with worsening cough for the past week. Endorses associated chills, generalized body ache, nasal congestion and rhinorrhea. History of COPD. VSS. Exam revealed mild and extremely wheezes and lower lobes. Patient without signs of increased work of breathing or respiratory distress. Remaining exam unremarkable. Patient given DuoNeb treatment in the ED. Chest x-ray showed cardiomegaly and suspected mild/early interstitial pulmonary edema, no evidence of focal consolidation. Discussed pt with Dr. Laverta Baltimore who also evaluated pt in the ED. Suspect pts sxs are likely due to mild COPD exacerbation. On reevaluation pt with improved wheezing s/p neb tx. Plan to d/c pt home with albuterol inhaler, prednisone and doxycyline. Advised patient to follow up with PCP. Discussed return precautions.  Final Clinical Impressions(s) / ED Diagnoses   Final diagnoses:  COPD exacerbation (HCC)    New Prescriptions New Prescriptions   ALBUTEROL (PROVENTIL HFA;VENTOLIN HFA) 108 (90 BASE) MCG/ACT INHALER    Inhale 1-2 puffs into the lungs every 6 (six) hours as needed for wheezing or shortness of  breath.   DOXYCYCLINE (VIBRAMYCIN) 100 MG CAPSULE    Take 1 capsule (100 mg total) by mouth daily.   PREDNISONE (DELTASONE) 20 MG TABLET    Take 2 tablets (40 mg total) by mouth daily.     Chesley Noon [place], [place] 12/02/16 2007    Margette Fast, MD 12/03/16 [phone]

## 2016-12-16 DIAGNOSIS — J449 Chronic obstructive pulmonary disease, unspecified: Secondary | ICD-10-CM | POA: Diagnosis not present

## 2016-12-16 DIAGNOSIS — E669 Obesity, unspecified: Secondary | ICD-10-CM | POA: Diagnosis not present

## 2016-12-16 DIAGNOSIS — J45991 Cough variant asthma: Secondary | ICD-10-CM | POA: Diagnosis not present

## 2016-12-16 DIAGNOSIS — I83213 Varicose veins of right lower extremity with both ulcer of ankle and inflammation: Secondary | ICD-10-CM | POA: Diagnosis not present

## 2016-12-16 DIAGNOSIS — I872 Venous insufficiency (chronic) (peripheral): Secondary | ICD-10-CM | POA: Diagnosis not present

## 2016-12-16 DIAGNOSIS — J4520 Mild intermittent asthma, uncomplicated: Secondary | ICD-10-CM | POA: Diagnosis not present

## 2016-12-16 DIAGNOSIS — Z7984 Long term (current) use of oral hypoglycemic drugs: Secondary | ICD-10-CM | POA: Diagnosis not present

## 2016-12-16 DIAGNOSIS — Z7951 Long term (current) use of inhaled steroids: Secondary | ICD-10-CM | POA: Diagnosis not present

## 2016-12-16 DIAGNOSIS — E785 Hyperlipidemia, unspecified: Secondary | ICD-10-CM | POA: Diagnosis not present

## 2016-12-16 DIAGNOSIS — Z7982 Long term (current) use of aspirin: Secondary | ICD-10-CM | POA: Diagnosis not present

## 2016-12-16 DIAGNOSIS — K219 Gastro-esophageal reflux disease without esophagitis: Secondary | ICD-10-CM | POA: Diagnosis not present

## 2016-12-16 DIAGNOSIS — C7A090 Malignant carcinoid tumor of the bronchus and lung: Secondary | ICD-10-CM | POA: Diagnosis not present

## 2016-12-16 DIAGNOSIS — Z6833 Body mass index (BMI) 33.0-33.9, adult: Secondary | ICD-10-CM | POA: Diagnosis not present

## 2016-12-16 DIAGNOSIS — G5603 Carpal tunnel syndrome, bilateral upper limbs: Secondary | ICD-10-CM | POA: Diagnosis not present

## 2016-12-16 DIAGNOSIS — E119 Type 2 diabetes mellitus without complications: Secondary | ICD-10-CM | POA: Diagnosis not present

## 2016-12-16 DIAGNOSIS — I10 Essential (primary) hypertension: Secondary | ICD-10-CM | POA: Diagnosis not present

## 2016-12-16 DIAGNOSIS — Z79899 Other long term (current) drug therapy: Secondary | ICD-10-CM | POA: Diagnosis not present

## 2016-12-27 DIAGNOSIS — Z872 Personal history of diseases of the skin and subcutaneous tissue: Secondary | ICD-10-CM | POA: Diagnosis not present

## 2016-12-27 DIAGNOSIS — Z09 Encounter for follow-up examination after completed treatment for conditions other than malignant neoplasm: Secondary | ICD-10-CM | POA: Diagnosis not present

## 2016-12-27 DIAGNOSIS — E669 Obesity, unspecified: Secondary | ICD-10-CM | POA: Diagnosis not present

## 2016-12-27 DIAGNOSIS — I872 Venous insufficiency (chronic) (peripheral): Secondary | ICD-10-CM | POA: Diagnosis not present

## 2017-01-10 ENCOUNTER — Telehealth: Payer: Self-pay | Admitting: *Deleted

## 2017-01-10 NOTE — Telephone Encounter (Signed)
Oncology Nurse Navigator Documentation  Oncology Nurse Navigator Flowsheets 01/10/2017  Navigator Location [facility]  Navigator Encounter Type Telephone  Telephone Outgoing Call/I received a call on Friday from [place] office regarding [name].  She apparently was questioning her having a CT scan vs CXR.  I spoke with Dr. Julien Nordmann today and he states she only needs CXR yearly.  I called [name] to update but was unable to reach her.  I left my name and phone number to call.   Treatment Phase Other  Barriers/Navigation Needs Education  Education Other  Interventions Education  Education Method Verbal  Acuity Level 2  Acuity Level 2 Educational needs  Time Spent with Patient 15

## 2017-01-25 ENCOUNTER — Other Ambulatory Visit: Payer: Self-pay | Admitting: Medical Oncology

## 2017-01-25 DIAGNOSIS — C7A090 Malignant carcinoid tumor of the bronchus and lung: Secondary | ICD-10-CM

## 2017-01-25 MED ORDER — INTEGRA PLUS PO CAPS: ORAL_CAPSULE | ORAL | 5 refills | Status: DC

## 2017-02-02 ENCOUNTER — Encounter (HOSPITAL_COMMUNITY): Payer: Self-pay

## 2017-02-02 ENCOUNTER — Emergency Department (HOSPITAL_COMMUNITY)
Admission: EM | Admit: 2017-02-02 | Discharge: 2017-02-02 | Disposition: A | Discharge status: Home / Self Care | Payer: Medicare Other | Attending: Emergency Medicine | Admitting: Emergency Medicine

## 2017-02-02 ENCOUNTER — Emergency Department (HOSPITAL_COMMUNITY): Payer: Medicare Other

## 2017-02-02 DIAGNOSIS — Z85030 Personal history of malignant carcinoid tumor of large intestine: Secondary | ICD-10-CM | POA: Diagnosis not present

## 2017-02-02 DIAGNOSIS — W19XXXA Unspecified fall, initial encounter: Secondary | ICD-10-CM

## 2017-02-02 DIAGNOSIS — Y929 Unspecified place or not applicable: Secondary | ICD-10-CM | POA: Insufficient documentation

## 2017-02-02 DIAGNOSIS — E1159 Type 2 diabetes mellitus with other circulatory complications: Secondary | ICD-10-CM | POA: Insufficient documentation

## 2017-02-02 DIAGNOSIS — I2510 Atherosclerotic heart disease of native coronary artery without angina pectoris: Secondary | ICD-10-CM | POA: Diagnosis not present

## 2017-02-02 DIAGNOSIS — I110 Hypertensive heart disease with heart failure: Secondary | ICD-10-CM | POA: Insufficient documentation

## 2017-02-02 DIAGNOSIS — W010XXA Fall on same level from slipping, tripping and stumbling without subsequent striking against object, initial encounter: Secondary | ICD-10-CM | POA: Diagnosis not present

## 2017-02-02 DIAGNOSIS — J449 Chronic obstructive pulmonary disease, unspecified: Secondary | ICD-10-CM | POA: Diagnosis not present

## 2017-02-02 DIAGNOSIS — I5032 Chronic diastolic (congestive) heart failure: Secondary | ICD-10-CM | POA: Diagnosis not present

## 2017-02-02 DIAGNOSIS — M545 Low back pain, unspecified: Secondary | ICD-10-CM

## 2017-02-02 DIAGNOSIS — Z7982 Long term (current) use of aspirin: Secondary | ICD-10-CM | POA: Diagnosis not present

## 2017-02-02 DIAGNOSIS — Z7984 Long term (current) use of oral hypoglycemic drugs: Secondary | ICD-10-CM | POA: Diagnosis not present

## 2017-02-02 DIAGNOSIS — S3992XA Unspecified injury of lower back, initial encounter: Secondary | ICD-10-CM | POA: Diagnosis not present

## 2017-02-02 DIAGNOSIS — Y939 Activity, unspecified: Secondary | ICD-10-CM | POA: Diagnosis not present

## 2017-02-02 DIAGNOSIS — Y999 Unspecified external cause status: Secondary | ICD-10-CM | POA: Insufficient documentation

## 2017-02-02 MED ORDER — TRAMADOL HCL 50 MG PO TABS
50.0000 mg | ORAL_TABLET | Freq: Once | ORAL | Status: AC
  Administered 2017-02-02: 50 mg via ORAL
  Filled 2017-02-02: qty 1

## 2017-02-02 MED ORDER — TRAMADOL HCL 50 MG PO TABS: 50.0000 mg | ORAL_TABLET | Freq: Four times a day (QID) | ORAL | 0 refills | Status: DC | PRN

## 2017-02-02 NOTE — ED Triage Notes (Signed)
Pt presents to the ed with daughter, translator offered and family and patient refused.  She fell three days ago and is complaining of pain in her entire back side, back of head, back and legs.  Denies taking any blood thinners. Patient is alert and oriented. In no apparent distress.

## 2017-02-02 NOTE — ED Provider Notes (Signed)
[place] DEPT Provider Note   CSN: [id number] Arrival date & time: 02/02/17  1450  By signing my name below, I, Reola Mosher, attest that this documentation has been prepared under the direction and in the presence of Virgel Manifold, MD. Electronically Signed: Reola Mosher, ED Scribe. 02/02/17. 4:03 PM.  History   Chief Complaint Chief Complaint  Patient presents with  . Fall   The history is provided by the patient and a relative. No language interpreter was used.    HPI Comments: [name] is a 83 y.o. female with a PMHx of DM, who presents to the Emergency Department s/p ground-level fall which occurred three days ago. Daughter reports that three days ago she slipped on a paper that was on her floor, causing her to fall backwards onto her back. No LOC. Per daughter, she has been mildly nausea and sustained one episode of vomiting yesterday and since her fall she has been experiencing persistent lower back pain. Her daughter also states that she has had a mild cough since yesterday as well. No noted treatments for her pain were tried prior to coming into the ED. Her pain is worse with generalized movements. Pt is not currently on anticoagulant or antiplatelet therapy. Denies urgency, hematuria, dysuria, difficulty urinating, or any other associated symptoms.   Past Medical History:  Diagnosis Date  . Ascending aortic aneurysm ([place])    a. 08/2015 stable 5 cm Asc Ao Aneurysm.  . Bronchitis   . Carcinoid tumor   . COPD (chronic obstructive pulmonary disease) ([place])   . Coronary artery disease    a. Ca2+ noted on CT - no other clear documentation of CAD.  Marland Kitchen Difficulty in walking(719.7)   . Disturbance of skin sensation   . Edema   . Essential hypertension   . GERD (gastroesophageal reflux disease)   . Hypercholesterolemia   . Hyperlipidemia   . Mitral valve disease    a. 10/2015 Echo: Mod MS, mild MR.  . Moderate aortic insufficiency    a. 10/2015 Echo: EF  50-55%, no rwma, mod AI, mod MS, mild MR, sev dil LA, mild TR/PR, PASP 79mHg.  . Pain in joint, forearm   . Pain in joint, hand   . Pain in joint, lower leg   . Pulmonary nodules    s. 08/2015 CT: stable lung nodules.  . Type II diabetes mellitus ([facility]    Patient Active Problem List   Diagnosis Date Noted  . Iron deficiency anemia 10/13/2016  . Venous ulcer of right leg ([place] 03/16/2016  . Type II diabetes mellitus (HM[place]   . Ascending aortic aneurysm (HD[place]   . Primary osteoarthritis of both hands 01/23/2016  . Varicose veins of right lower extremity with complications 094/49/6759 . Open wound of lower leg 08/26/2015  . Venous ulcer 08/26/2015  . Lymphedema of leg 07/23/2015  . Varicose veins of lower extremities with ulcer and inflammation (HB[place] 07/23/2015  . Chronic venous insufficiency 07/23/2015  . Leg ulcer ([place] 07/16/2015  . Ulcer of right lower leg (HT[place] 07/12/2015  . Cellulitis of right leg 07/12/2015  . Hyponatremia 07/12/2015  . Chronic diastolic heart failure ([place] 07/12/2015  . Right hip pain 01/07/2015  . Primary osteoarthritis of right hip 01/07/2015  . Hip contracture 01/07/2015  . AI (aortic incompetence) 08/07/2014  . Diastolic murmur 116/38/4665 . Asthma, mild intermittent 04/04/2013  . Leg varices 03/09/2013  . Asthma, cough variant 10/04/2012  . Eczema intertrigo 05/11/2012  . Basal cell  papilloma 05/11/2012  . Carcinoid tumor 10/20/2011  . Allergic rhinitis 07/23/2011  . Airway hyperreactivity 07/23/2011  . Chronic cough 07/23/2011  . Diabetes mellitus type 2 with peripheral artery disease ([place]) 06/19/2009  . HYPERCHOLESTEROLEMIA 06/19/2009  . HYPERLIPIDEMIA 06/19/2009  . Essential hypertension 06/19/2009  . Coronary atherosclerosis 06/19/2009  . Aortic valve regurgitation 06/19/2009  . AORTIC ANEURYSM 06/19/2009  . BRONCHITIS 06/19/2009  . COPD (chronic obstructive pulmonary disease) ([place]) 06/19/2009  . GERD 06/19/2009  . EDEMA 06/19/2009  .  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 bronch with endobronchial biopies  09/15/2009  . COLONOSCOPY N/A 09/13/2013   Procedure: COLONOSCOPY;  Surgeon: Jeryl Columbia, MD;  Location[facility];  Service: Endoscopy;  Laterality: N/A;  . endobronchial excision of right upper lobe tumor with laser bronchi  10/06/2009  . fiberoptic bronch with endobronchial u/s  07/02/2010  . PARS PLANA VITRECTOMY     right eye  . PARS PLANA VITRECTOMY W/ ENDOPHOTOCOAGULATION     right eye  . posterior capsulectomy     right eye  . VIDEO BRONCHOSCOPY  12/09/2009   OB History    Gravida Para Term Preterm AB Living   '2 1 1   1 1   '$ SAB TAB Ectopic Multiple Live Births   1       1     Home Medications    Prior to Admission medications   Medication Sig Start Date End Date Taking? Authorizing Provider  albuterol (PROVENTIL HFA;VENTOLIN HFA) 108 (90 Base) MCG/ACT inhaler Inhale 1-2 puffs into the lungs every 6 (six) hours as needed for wheezing or shortness of breath. 12/02/16   Margette Fast, MD  amLODipine (NORVASC) 10 MG tablet TAKE 1 TABLET (10 MG TOTAL) BY MOUTH DAILY. 06/24/16   Josue Hector, MD  aspirin EC 81 MG tablet Take 81 mg by mouth every evening. 7 pm    Historical Provider, MD  atorvastatin (LIPITOR) 10 MG tablet Take 10 mg by mouth at bedtime. 06/17/15   Historical Provider, MD  FeFum-FePoly-FA-B Cmp-C-Biot (INTEGRA PLUS) CAPS TAKE ONE CAPSULE BY MOUTH EVERY DAY *NOT COVERED* 01/25/17   Curt Bears, MD  fluticasone (FLOVENT HFA) 110 MCG/ACT inhaler Inhale 2 puffs into the lungs 2 (two) times daily.    Historical Provider, MD  furosemide (LASIX) 20 MG tablet TAKE 1 TABLET (20 MG TOTAL) BY MOUTH DAILY AS NEEDED FOR FLUID OR EDEMA. 10/15/16   Josue Hector, MD  glimepiride (AMARYL) 1 MG tablet Take 1 mg by mouth daily.     Historical Provider, MD  hydrALAZINE (APRESOLINE) 50 MG tablet Take 1 tablet (50 mg total) by mouth 3 (three)  times daily. 09/15/16   Josue Hector, MD  montelukast (SINGULAIR) 10 MG tablet Take 10 mg by mouth at bedtime. 06/04/15   Historical Provider, MD  Multiple Vitamin (MULITIVITAMIN WITH MINERALS) TABS Take 1 tablet by mouth daily.    Historical Provider, MD  nitroGLYCERIN (NITROSTAT) 0.4 MG SL tablet PLACE 1 TABLET UNDER THE TONGUE EVERY 5 MINUTES AS NEEDED FOR CHEST PAIN Patient not taking: Reported on 10/13/2016 09/14/16   Josue Hector, MD  olmesartan (BENICAR) 40 MG tablet TAKE 1 TABLET (40 MG TOTAL) BY MOUTH 2 (TWO) TIMES DAILY. 10/20/16   Josue Hector, MD  Omega-3 Fatty Acids (FISH OIL) 1200 MG CAPS Take 1 capsule by mouth daily.    Historical Provider, MD  omeprazole (PRILOSEC) 20 MG capsule Take  20 mg by mouth daily before breakfast. 06/04/15   Historical Provider, MD  pioglitazone (ACTOS) 15 MG tablet Take 7.5 mg by mouth daily.     Historical Provider, MD  potassium chloride (K-DUR,KLOR-CON) 10 MEQ tablet Take 20 mEq by mouth daily.    Historical Provider, MD  Probiotic Product (PROBIOTIC PO) Take 1 tablet by mouth daily.    Historical Provider, MD  Spacer/Aero-Holding Chambers (AEROCHAMBER MINI CHAMBER) DEVI use as directed with your inhalers  04/27/11   Historical Provider, MD  traMADol (ULTRAM) 50 MG tablet Take 1 tablet (50 mg total) by mouth every 6 (six) hours as needed. 07/19/16   Charlett Blake, MD   Family History Family History  Problem Relation Age of Onset  . Hypertension Mother   . Hypertension Father   . Heart attack Neg Hx   . Stroke Neg Hx    Social History Social History  Substance Use Topics  . Smoking status: Never Smoker  . Smokeless tobacco: Never Used  . Alcohol use No   Allergies   Patient has no known allergies.  Review of Systems Review of Systems  Gastrointestinal: Positive for abdominal pain.  Genitourinary: Negative for difficulty urinating, dysuria, hematuria and urgency.  Musculoskeletal: Positive for back pain (lower) and myalgias.  All  other systems reviewed and are negative.  Physical Exam Updated Vital Signs BP (!) 148/62   Pulse 77   Temp 98.8 F (37.1 C) (Oral)   Resp 18   Ht 5' (1.524 m)   Wt 150 lb (68 kg)   SpO2 97%   BMI 29.29 kg/m   Physical Exam  Constitutional: She appears well-developed and well-nourished.  HENT:  Head: Normocephalic.  Right Ear: External ear normal.  Left Ear: External ear normal.  Nose: Nose normal.  Mouth/Throat: Oropharynx is clear and moist.  Eyes: Conjunctivae are normal. Right eye exhibits no discharge. Left eye exhibits no discharge.  Neck: Normal range of motion.  Cardiovascular: Normal rate, regular rhythm and normal heart sounds.   No murmur heard. Pulmonary/Chest: Effort normal and breath sounds normal. No respiratory distress. She has no wheezes. She has no rales.  Abdominal: Soft. She exhibits no distension. There is no tenderness. There is no rebound and no guarding.  Musculoskeletal: She exhibits tenderness. She exhibits no edema.  Mild midline lumbar tenderness.   Neurological: She is alert. No cranial nerve deficit. Coordination normal.  Normal strength to the bilateral lower extremities.   Skin: Skin is warm and dry. No rash noted. No erythema. No pallor.  Psychiatric: She has a normal mood and affect. Her behavior is normal.  Nursing note and vitals reviewed.  ED Treatments / Results  DIAGNOSTIC STUDIES: Oxygen Saturation is 97% on RA, normal by my interpretation.   COORDINATION OF CARE: 4:03 PM-Discussed next steps with pt. Pt verbalized understanding and is agreeable with the plan.   Labs (all labs ordered are listed, but only abnormal results are displayed) Labs Reviewed - No data to display  EKG  EKG Interpretation None      Radiology No results found.  Procedures Procedures   Medications Ordered in ED Medications - No data to display  Initial Impression / Assessment and Plan / ED Course  I have reviewed the triage vital signs and  the nursing notes.  Pertinent labs & imaging results that were available during my care of the patient were reviewed by me and considered in my medical decision making (see chart for details).  Back pain after fall. nonfocal neuro exam. Imaging ok.   Final Clinical Impressions(s) / ED Diagnoses   Final diagnoses:  Fall, initial encounter  Acute midline low back pain without sciatica   New Prescriptions New Prescriptions   No medications on file   I personally preformed the services scribed in my presence. The recorded information has been reviewed is accurate. Virgel Manifold, MD.     Virgel Manifold, MD 02/10/17 [phone]

## 2017-02-02 NOTE — ED Notes (Signed)
Papers reviewed with patient and family member as Optometrist per their request. They verbalize understanding

## 2017-02-14 ENCOUNTER — Other Ambulatory Visit: Payer: Self-pay | Admitting: Cardiovascular Disease

## 2017-02-14 DIAGNOSIS — K5900 Constipation, unspecified: Secondary | ICD-10-CM | POA: Diagnosis not present

## 2017-02-14 DIAGNOSIS — K219 Gastro-esophageal reflux disease without esophagitis: Secondary | ICD-10-CM | POA: Diagnosis not present

## 2017-02-15 ENCOUNTER — Telehealth: Payer: Self-pay | Admitting: Cardiovascular Disease

## 2017-02-15 NOTE — Telephone Encounter (Signed)
Called pt and left message stating that pt has refills of both of the medications that she requested at [facility] on [facility] [address] [facility], with refills. I called [facility] and they stated that they would be getting that medications ready for the pt.Amlodipine 10 mg and Benicar 40 mg tablets. I advised the pt that if she need anything else to give our office a call back.

## 2017-02-15 NOTE — Telephone Encounter (Signed)
New Message   *STAT* If patient is at the pharmacy, call can be transferred to refill team.   1. Which medications need to be refilled? (please list name of each medication and dose if known) amlopinine '10mg'$  & beniciar '40mg'$  2x day  2. Which pharmacy/location (including street and city if local pharmacy) is medication to be sent to? [facility] on [address]   [phone] 3. Do they need a 30 day or 90 day supply?  [place]

## 2017-02-15 NOTE — Telephone Encounter (Signed)
Per epic, patient has refills at the pharmacy. I called [facility] and spoke with [facility] and she verified that the patient does have refills on both of these medications.

## 2017-02-16 ENCOUNTER — Telehealth: Payer: Self-pay | Admitting: Physical Medicine & Rehabilitation

## 2017-02-16 NOTE — Telephone Encounter (Signed)
Needs pain med refill from original visit in 017 told her needs appt to see physician

## 2017-02-18 ENCOUNTER — Telehealth: Payer: Self-pay | Admitting: Vascular Surgery

## 2017-02-18 ENCOUNTER — Ambulatory Visit: Payer: Medicare Other | Admitting: Physical Medicine & Rehabilitation

## 2017-02-18 NOTE — Telephone Encounter (Signed)
Per Sydell Axon needs to leave early on 03/29/17 due to a dental appt. I called the patient and left a VM for her to reschedule the appt w/[facility] to another day and if possible keep her ultrasound appt on 03/29/17. Or she can reschedule both appointments. awt

## 2017-02-21 ENCOUNTER — Encounter: Payer: Medicare Other | Attending: Physical Medicine & Rehabilitation

## 2017-02-21 ENCOUNTER — Encounter: Payer: Self-pay | Admitting: Physical Medicine & Rehabilitation

## 2017-02-21 ENCOUNTER — Ambulatory Visit (HOSPITAL_BASED_OUTPATIENT_CLINIC_OR_DEPARTMENT_OTHER): Payer: Medicare Other | Admitting: Physical Medicine & Rehabilitation

## 2017-02-21 VITALS — BP 172/76 | HR 91

## 2017-02-21 DIAGNOSIS — M1611 Unilateral primary osteoarthritis, right hip: Secondary | ICD-10-CM | POA: Insufficient documentation

## 2017-02-21 DIAGNOSIS — M47816 Spondylosis without myelopathy or radiculopathy, lumbar region: Secondary | ICD-10-CM | POA: Diagnosis not present

## 2017-02-21 DIAGNOSIS — S46912A Strain of unspecified muscle, fascia and tendon at shoulder and upper arm level, left arm, initial encounter: Secondary | ICD-10-CM | POA: Diagnosis not present

## 2017-02-21 DIAGNOSIS — M25512 Pain in left shoulder: Secondary | ICD-10-CM | POA: Insufficient documentation

## 2017-02-21 DIAGNOSIS — G894 Chronic pain syndrome: Secondary | ICD-10-CM | POA: Insufficient documentation

## 2017-02-21 DIAGNOSIS — M5136 Other intervertebral disc degeneration, lumbar region: Secondary | ICD-10-CM | POA: Insufficient documentation

## 2017-02-21 DIAGNOSIS — M51369 Other intervertebral disc degeneration, lumbar region without mention of lumbar back pain or lower extremity pain: Secondary | ICD-10-CM | POA: Insufficient documentation

## 2017-02-21 DIAGNOSIS — M25551 Pain in right hip: Secondary | ICD-10-CM | POA: Diagnosis not present

## 2017-02-21 DIAGNOSIS — M79604 Pain in right leg: Secondary | ICD-10-CM | POA: Insufficient documentation

## 2017-02-21 DIAGNOSIS — M545 Low back pain: Secondary | ICD-10-CM | POA: Insufficient documentation

## 2017-02-21 MED ORDER — TRAMADOL HCL 50 MG PO TABS: 100.0000 mg | ORAL_TABLET | Freq: Two times a day (BID) | ORAL | 5 refills | Status: DC | PRN

## 2017-02-21 NOTE — Progress Notes (Signed)
Subjective:    Patient ID: [name], female    DOB: September 06, 1932, 83 y.o.   MRN: [id number]  HPI  83 year old female with severe right hip osteoarthritis, chronic low back pain.  Right hip pain persists has appt with Dr Alvan Dame Right leg pain , Has appointment with vascular clinic Has prior history of poorly healing wound, but no longer needs to go to wound Center.  Left shoulder pain, Good range of motion. No recent trauma. Interval history positive for ground-level fall, 02/02/2017, ED visit, x-rays showed chronic degenerative changes. No mention of shoulder pain, no shoulder imaging studies. Pain Inventory Average Pain 4 Pain Right Now 4 My pain is other  In the last 24 hours, has pain interfered with the following? General activity 4 Relation with others 3 Enjoyment of life 4 What TIME of day is your pain at its worst? daytime Sleep (in general) Fair  Pain is worse with: some activites Pain improves with: heat/ice and medication Relief from Meds: 5  Mobility walk with assistance use a walker ability to climb steps?  no do you drive?  no transfers alone Do you have any goals in this area?  no  Function retired  Neuro/Psych trouble walking  Prior Studies Any changes since last visit?  no Generalized lumbar spine pain.   EXAM: LUMBAR SPINE - COMPLETE 4+ VIEW   COMPARISON:  Pelvic radiograph January 09, 2015   FINDINGS: No acute fracture deformity. Minimal grade 1 L4-5 anterolisthesis. Severe L3-4 disc height loss, endplate sclerosis and marginal spurring compatible with degenerative disc, moderate to severe L1-2 and L2-3. Moderate lower lumbar facet arthropathy. No destructive bony lesions. Osteopenia. Moderate atherosclerosis. Severe degenerative change of the RIGHT hip. Coarse calcifications in the pelvis likely represent involuted leiomyomas.   IMPRESSION: No acute lumbar spine fracture deformity. Grade 1 L4-5 anterolisthesis and advanced  degenerative change of lumbar spine.   Severe RIGHT hip osteoarthrosis.     Electronically Signed   By: Elon Alas M.D.   On: 02/02/2017 16:58  Physicians involved in your care Any changes since last visit?  no   Family History  Problem Relation Age of Onset  . Hypertension Mother   . Hypertension Father   . Heart attack Neg Hx   . Stroke Neg Hx    Social History   Social History  . Marital status: Married    Spouse name: N/A  . Number of children: N/A  . Years of education: N/A   Social History Main Topics  . Smoking status: Never Smoker  . Smokeless tobacco: Never Used  . Alcohol use No  . Drug use: No  . Sexual activity: Not Currently   Other Topics Concern  . None   Social History Narrative  . None   Past Surgical History:  Procedure Laterality Date  . bronch with endobronchial biopies  09/15/2009  . COLONOSCOPY N/A 09/13/2013   Procedure: COLONOSCOPY;  Surgeon: Jeryl Columbia, MD;  Location: [facility];  Service: Endoscopy;  Laterality: N/A;  . endobronchial excision of right upper lobe tumor with laser bronchi  10/06/2009  . fiberoptic bronch with endobronchial u/s  07/02/2010  . PARS PLANA VITRECTOMY     right eye  . PARS PLANA VITRECTOMY W/ ENDOPHOTOCOAGULATION     right eye  . posterior capsulectomy     right eye  . VIDEO BRONCHOSCOPY  12/09/2009   Past Medical History:  Diagnosis Date  . Ascending aortic aneurysm ([place])    a. 08/2015 stable  5 cm Asc Ao Aneurysm.  . Bronchitis   . Carcinoid tumor   . COPD (chronic obstructive pulmonary disease) ([place])   . Coronary artery disease    a. Ca2+ noted on CT - no other clear documentation of CAD.  Marland Kitchen Difficulty in walking(719.7)   . Disturbance of skin sensation   . Edema   . Essential hypertension   . GERD (gastroesophageal reflux disease)   . Hypercholesterolemia   . Hyperlipidemia   . Mitral valve disease    a. 10/2015 Echo: Mod MS, mild MR.  . Moderate aortic insufficiency    a.  10/2015 Echo: EF 50-55%, no rwma, mod AI, mod MS, mild MR, sev dil LA, mild TR/PR, PASP 25mHg.  . Pain in joint, forearm   . Pain in joint, hand   . Pain in joint, lower leg   . Pulmonary nodules    s. 08/2015 CT: stable lung nodules.  . Type II diabetes mellitus (HCC)    BP (!) 172/76   Pulse 91   SpO2 92%   Opioid Risk Score:   Fall Risk Score:  `1  Depression screen PHQ 2/9  [facility] 07/19/2016 01/23/2016 01/07/2015  Decreased Interest 3 3 0  Down, Depressed, Hopeless 0 0 0  PHQ - 2 Score 3 3 0  Altered sleeping - 3 3  Tired, decreased energy - 1 2  Change in appetite - 0 0  Feeling bad or failure about yourself  - 0 3  Trouble concentrating - 0 0  Moving slowly or fidgety/restless - 0 3  Suicidal thoughts - 0 0  PHQ-9 Score - 7 11  Difficult doing work/chores - Not difficult at all -  Some recent data might be hidden    Review of Systems     Objective:   Physical Exam  Constitutional: She is oriented to person, place, and time. She appears well-developed and well-nourished. No distress.  HENT:  Head: Normocephalic and atraumatic.  Eyes: Conjunctivae and EOM are normal. Pupils are equal, round, and reactive to light.  Neck: Normal range of motion.  Musculoskeletal:       Right hip: She exhibits decreased range of motion, decreased strength and deformity.  Mild flexion deformity right hip when standing. Decreased range of motion with absent internal/external rotation. Has decreased right hip flexor strength  Neurological: She is alert and oriented to person, place, and time. Gait abnormal.  Motor strength is 5/5 bilateral deltoid, biceps, triceps, grip, as well as bilateral knee extensors, ankle dorsiflexors. Antalgic gait using walker, favors right lower extremity  Skin: She is not diaphoretic.  Psychiatric: She has a normal mood and affect. Her behavior is normal.  Russian-speaking  Nursing note and vitals reviewed.   Negative impingement  signs, left shoulder, full range of motion. Normal strength, no deformity, no bruising      Assessment & Plan:  #1. Severe right hip osteoarthritis. Recommend orthopedic reevaluation, ultimately, I think she'll need to undergo hip replacement, although the patient is resistant to procedures and surgery.  Continue tramadol 100 mg twice a day  2. Left shoulder pain does not appear to be severe. She has full range of motion. Negative impingement signs. Maybe strained from recent fall. Expect this to resolve without specific treatment  3. Chronic low back pain. She has evidence of spondylosis, multilevel. No clinical evidence of radiculopathy. Continue tramadol 100 mg twice a day  Return to clinic in 6 months

## 2017-02-21 NOTE — Patient Instructions (Signed)
Salon Pas  Or Icy Hot patch to Left shoulder

## 2017-03-10 ENCOUNTER — Other Ambulatory Visit: Payer: Self-pay | Admitting: *Deleted

## 2017-03-10 DIAGNOSIS — I712 Thoracic aortic aneurysm, without rupture, unspecified: Secondary | ICD-10-CM

## 2017-03-14 ENCOUNTER — Encounter: Payer: Self-pay | Admitting: Podiatry

## 2017-03-14 ENCOUNTER — Ambulatory Visit (INDEPENDENT_AMBULATORY_CARE_PROVIDER_SITE_OTHER): Payer: Medicare Other | Admitting: Podiatry

## 2017-03-14 DIAGNOSIS — B351 Tinea unguium: Secondary | ICD-10-CM | POA: Diagnosis not present

## 2017-03-14 DIAGNOSIS — E119 Type 2 diabetes mellitus without complications: Secondary | ICD-10-CM

## 2017-03-14 DIAGNOSIS — M79674 Pain in right toe(s): Secondary | ICD-10-CM | POA: Diagnosis not present

## 2017-03-14 DIAGNOSIS — M79675 Pain in left toe(s): Secondary | ICD-10-CM

## 2017-03-14 DIAGNOSIS — L84 Corns and callosities: Secondary | ICD-10-CM

## 2017-03-15 ENCOUNTER — Other Ambulatory Visit: Payer: Self-pay | Admitting: *Deleted

## 2017-03-15 DIAGNOSIS — I712 Thoracic aortic aneurysm, without rupture, unspecified: Secondary | ICD-10-CM

## 2017-03-15 NOTE — Progress Notes (Signed)
Subjective: 83 y.o. returns the office today for painful, elongated, thickened toenails which she cannot trim herself. She also has a history of ulcer on the right leg ulcer but denies any skin break down today. She has a callus on the left big toe. Denies any redness or drainage around the nails. Denies any acute changes since last appointment and no new complaints today. Denies any systemic complaints such as fevers, chills, nausea, vomiting.   Objective: AAO 3, NAD DP/PT pulses palpable, CRT less than 3 seconds Nails hypertrophic, dystrophic, elongated, brittle, discolored 10. There is tenderness overlying the nails 1-5 bilaterally. There is no surrounding erythema or drainage along the nail sites. Healed ulcer right distal leg. No skin breakdown today Hyperkeratotic lesion left hallux. No underlying ulcer, drainage, signs of infection.  No open lesions or pre-ulcerative lesions are identified. No other areas of tenderness bilateral lower extremities. No overlying edema, erythema, increased warmth. No pain with calf compression, swelling, warmth, erythema.  Assessment: Patient presents with symptomatic onychomycosis; hyperkeratotic lesion x 1  Plan: -Treatment options including alternatives, risks, complications were discussed -Nails sharply debrided 10 without complication/bleeding. -Hyperkeratotic lesion sharply debrided x 1 without complications or bleeding.  -Discussed daily foot inspection. If there are any changes, to call the office immediately.  -Follow-up in 3 months or sooner if any problems are to arise. In the meantime, encouraged to call the office with any questions, concerns, changes symptoms.  Celesta Gentile, DPM

## 2017-03-16 DIAGNOSIS — M1611 Unilateral primary osteoarthritis, right hip: Secondary | ICD-10-CM | POA: Diagnosis not present

## 2017-03-16 DIAGNOSIS — M25551 Pain in right hip: Secondary | ICD-10-CM | POA: Diagnosis not present

## 2017-03-18 ENCOUNTER — Encounter (HOSPITAL_COMMUNITY): Payer: Self-pay | Admitting: *Deleted

## 2017-03-18 ENCOUNTER — Ambulatory Visit (HOSPITAL_COMMUNITY)
Admission: EM | Admit: 2017-03-18 | Discharge: 2017-03-18 | Disposition: A | Discharge status: Home / Self Care | Payer: Medicare Other | Attending: Internal Medicine | Admitting: Internal Medicine

## 2017-03-18 DIAGNOSIS — B372 Candidiasis of skin and nail: Secondary | ICD-10-CM

## 2017-03-18 MED ORDER — TRIAMCINOLONE ACETONIDE 0.1 % EX CREA: 1.0000 "application " | TOPICAL_CREAM | Freq: Two times a day (BID) | CUTANEOUS | 0 refills | Status: AC

## 2017-03-18 MED ORDER — NYSTATIN 100000 UNIT/GM EX CREA: TOPICAL_CREAM | CUTANEOUS | 0 refills | Status: DC

## 2017-03-18 NOTE — Discharge Instructions (Signed)
You have a yeast skin infection. I prescribed a topical cream called nystatin, apply to the affected area twice daily. Keep it clean, dry, you may cover in a very ointment containing zinc oxide to help keep it dry. Follow-up with her primary care provider in one to 2 weeks as needed

## 2017-03-18 NOTE — ED Provider Notes (Signed)
CSN: [id number]     Arrival date & time 03/18/17  1357 History   First MD Initiated Contact with Patient 03/18/17 1423     Chief Complaint  Patient presents with  . Rash   (Consider location/radiation/quality/duration/timing/severity/associated sxs/prior Treatment) 83 year old female presents to clinic in the care of her daughter with a chief complaint of rash in her groin area. Initially patient and the patient's daughter wanted to refuse to be seen by a nurse practitioner, it was explained to the patient that at the time of her visit there was no medical doctor here in the clinic, only 1 other nurse practitioner, and one physician assistant in clinic, she agreed to be seen. Rash is been present for 3-4 days, is nonpainful, however it is irritating. She has no dysuria, or other urinary symptoms. She is incontinent, and wears depends undergarments. She has no constitutional symptoms such as fever, chills, or loss of appetite.   The history is provided by the patient and a relative.  Rash    Past Medical History:  Diagnosis Date  . Ascending aortic aneurysm ([place])    a. 08/2015 stable 5 cm Asc Ao Aneurysm.  . Bronchitis   . Carcinoid tumor   . COPD (chronic obstructive pulmonary disease) ([place])   . Coronary artery disease    a. Ca2+ noted on CT - no other clear documentation of CAD.  Marland Kitchen Difficulty in walking(719.7)   . Disturbance of skin sensation   . Edema   . Essential hypertension   . GERD (gastroesophageal reflux disease)   . Hypercholesterolemia   . Hyperlipidemia   . Mitral valve disease    a. 10/2015 Echo: Mod MS, mild MR.  . Moderate aortic insufficiency    a. 10/2015 Echo: EF 50-55%, no rwma, mod AI, mod MS, mild MR, sev dil LA, mild TR/PR, PASP 74mmHg.  . Pain in joint, forearm   . Pain in joint, hand   . Pain in joint, lower leg   . Pulmonary nodules    s. 08/2015 CT: stable lung nodules.  . Type II diabetes mellitus ([place])    Past Surgical History:  Procedure  Laterality Date  . bronch with endobronchial biopies  09/15/2009  . COLONOSCOPY N/A 09/13/2013   Procedure: COLONOSCOPY;  Surgeon: Jeryl Columbia, MD;  Location: [facility];  Service: Endoscopy;  Laterality: N/A;  . endobronchial excision of right upper lobe tumor with laser bronchi  10/06/2009  . fiberoptic bronch with endobronchial u/s  07/02/2010  . PARS PLANA VITRECTOMY     right eye  . PARS PLANA VITRECTOMY W/ ENDOPHOTOCOAGULATION     right eye  . posterior capsulectomy     right eye  . VIDEO BRONCHOSCOPY  12/09/2009   Family History  Problem Relation Age of Onset  . Hypertension Mother   . Hypertension Father   . Heart attack Neg Hx   . Stroke Neg Hx    Social History  Substance Use Topics  . Smoking status: Never Smoker  . Smokeless tobacco: Never Used  . Alcohol use No   OB History    Gravida Para Term Preterm AB Living   2 1 1   1 1    SAB TAB Ectopic Multiple Live Births   1       1     Review of Systems  Constitutional: Negative.   HENT: Negative.   Respiratory: Negative.   Cardiovascular: Negative.   Gastrointestinal: Negative.   Musculoskeletal: Negative.   Skin: Positive for rash.  Neurological: Negative.     Allergies  Patient has no known allergies.  Home Medications   Prior to Admission medications   Medication Sig Start Date End Date Taking? Authorizing Provider  albuterol (PROVENTIL HFA;VENTOLIN HFA) 108 (90 Base) MCG/ACT inhaler Inhale 1-2 puffs into the lungs every 6 (six) hours as needed for wheezing or shortness of breath. 12/02/16   Long, Wonda Olds, MD  amLODipine (NORVASC) 10 MG tablet TAKE 1 TABLET (10 MG TOTAL) BY MOUTH DAILY. 02/14/17   Josue Hector, MD  aspirin EC 81 MG tablet Take 81 mg by mouth every evening. 7 pm    [provider]  atorvastatin (LIPITOR) 10 MG tablet Take 10 mg by mouth at bedtime. 06/17/15   [provider]  FeFum-FePoly-FA-B Cmp-C-Biot (INTEGRA PLUS) CAPS TAKE ONE CAPSULE BY MOUTH EVERY DAY *NOT  COVERED* 01/25/17   Curt Bears, MD  fluticasone (FLOVENT HFA) 110 MCG/ACT inhaler Inhale 2 puffs into the lungs 2 (two) times daily.    [provider]  furosemide (LASIX) 20 MG tablet TAKE 1 TABLET (20 MG TOTAL) BY MOUTH DAILY AS NEEDED FOR FLUID OR EDEMA. 10/15/16   Josue Hector, MD  glimepiride (AMARYL) 1 MG tablet Take 1 mg by mouth daily.     [provider]  hydrALAZINE (APRESOLINE) 50 MG tablet Take 1 tablet (50 mg total) by mouth 3 (three) times daily. 09/15/16   Josue Hector, MD  montelukast (SINGULAIR) 10 MG tablet Take 10 mg by mouth at bedtime. 06/04/15   [provider]  Multiple Vitamin (MULITIVITAMIN WITH MINERALS) TABS Take 1 tablet by mouth daily.    [provider]  nitroGLYCERIN (NITROSTAT) 0.4 MG SL tablet PLACE 1 TABLET UNDER THE TONGUE EVERY 5 MINUTES AS NEEDED FOR CHEST PAIN 09/14/16   Josue Hector, MD  nystatin cream (MYCOSTATIN) Apply to affected area 2 times daily 03/18/17   Barnet Glasgow, NP  olmesartan (BENICAR) 40 MG tablet TAKE 1 TABLET (40 MG TOTAL) BY MOUTH 2 (TWO) TIMES DAILY. 10/20/16   Josue Hector, MD  Omega-3 Fatty Acids (FISH OIL) 1200 MG CAPS Take 1 capsule by mouth daily.    [provider]  omeprazole (PRILOSEC) 20 MG capsule Take 20 mg by mouth daily before breakfast. 06/04/15   [provider]  pioglitazone (ACTOS) 15 MG tablet Take 7.5 mg by mouth daily.     [provider]  potassium chloride (K-DUR,KLOR-CON) 10 MEQ tablet Take 20 mEq by mouth daily.    [provider]  Probiotic Product (PROBIOTIC PO) Take 1 tablet by mouth daily.    [provider]  Spacer/Aero-Holding Chambers (AEROCHAMBER MINI CHAMBER) DEVI use as directed with your inhalers  04/27/11   [provider]  traMADol (ULTRAM) 50 MG tablet Take 2 tablets (100 mg total) by mouth every 12 (twelve) hours as needed. 02/21/17   Kirsteins, Luanna Salk, MD   Meds Ordered and Administered this  Visit  Medications - No data to display  BP (!) 150/77 (BP Location: Right Arm)   Pulse 96   Temp 98.3 F (36.8 C) (Oral)   Resp 18   SpO2 100%  No data found.   Physical Exam  Constitutional: She appears well-developed and well-nourished. No distress.  HENT:  Head: Normocephalic.  Right Ear: External ear normal.  Left Ear: External ear normal.  Eyes: Conjunctivae are normal.  Neck: Normal range of motion.  Cardiovascular: Normal rate and regular rhythm.   Pulmonary/Chest: Effort normal and  breath sounds normal.  Genitourinary:     Neurological: She is alert.  Skin: Skin is warm and dry. Capillary refill takes less than 2 seconds. Rash noted. She is not diaphoretic.  Multiple erythemic, crusting, lesions in her groin, with satellite lesions present.  Psychiatric: She has a normal mood and affect. Her behavior is normal.  Nursing note and vitals reviewed.   Urgent Care Course     Procedures (including critical care time)  Labs Review Labs Reviewed - No data to display  Imaging Review No results found.     MDM   1. Yeast infection of the skin    Given nystatin cream, instructions on its use, recommend keeping the area clean, dry, encouraged frequent changing of her depends undergarments, follow-up with her primary care provider in one week as needed.    Barnet Glasgow, NP 03/18/17 [phone]

## 2017-03-18 NOTE — ED Triage Notes (Signed)
Rash  Between  Legs   X   1   Week         Not   releived  Frequent urination  As   Well    Daughter here   To  Interpret

## 2017-03-21 ENCOUNTER — Encounter (HOSPITAL_COMMUNITY): Payer: Self-pay | Admitting: *Deleted

## 2017-03-21 ENCOUNTER — Emergency Department (HOSPITAL_COMMUNITY)
Admission: EM | Admit: 2017-03-21 | Discharge: 2017-03-21 | Disposition: A | Discharge status: Home / Self Care | Payer: Medicare Other | Attending: Emergency Medicine | Admitting: Emergency Medicine

## 2017-03-21 DIAGNOSIS — I110 Hypertensive heart disease with heart failure: Secondary | ICD-10-CM | POA: Diagnosis not present

## 2017-03-21 DIAGNOSIS — B3741 Candidal cystitis and urethritis: Secondary | ICD-10-CM | POA: Diagnosis not present

## 2017-03-21 DIAGNOSIS — J449 Chronic obstructive pulmonary disease, unspecified: Secondary | ICD-10-CM | POA: Insufficient documentation

## 2017-03-21 DIAGNOSIS — Z7982 Long term (current) use of aspirin: Secondary | ICD-10-CM | POA: Diagnosis not present

## 2017-03-21 DIAGNOSIS — E119 Type 2 diabetes mellitus without complications: Secondary | ICD-10-CM | POA: Insufficient documentation

## 2017-03-21 DIAGNOSIS — I5032 Chronic diastolic (congestive) heart failure: Secondary | ICD-10-CM | POA: Insufficient documentation

## 2017-03-21 DIAGNOSIS — I2510 Atherosclerotic heart disease of native coronary artery without angina pectoris: Secondary | ICD-10-CM | POA: Diagnosis not present

## 2017-03-21 DIAGNOSIS — B9789 Other viral agents as the cause of diseases classified elsewhere: Secondary | ICD-10-CM | POA: Diagnosis not present

## 2017-03-21 DIAGNOSIS — R21 Rash and other nonspecific skin eruption: Secondary | ICD-10-CM | POA: Diagnosis present

## 2017-03-21 DIAGNOSIS — Z79899 Other long term (current) drug therapy: Secondary | ICD-10-CM | POA: Insufficient documentation

## 2017-03-21 DIAGNOSIS — B3749 Other urogenital candidiasis: Secondary | ICD-10-CM

## 2017-03-21 LAB — COMPREHENSIVE METABOLIC PANEL
ALT: 17 U/L (ref 14–54)
ANION GAP: 10 (ref 5–15)
AST: 24 U/L (ref 15–41)
Albumin: 3.9 g/dL (ref 3.5–5.0)
Alkaline Phosphatase: 92 U/L (ref 38–126)
BILIRUBIN TOTAL: 0.5 mg/dL (ref 0.3–1.2)
BUN: 23 mg/dL — AB (ref 6–20)
CO2: 25 mmol/L (ref 22–32)
Calcium: 9.1 mg/dL (ref 8.9–10.3)
Chloride: 102 mmol/L (ref 101–111)
Creatinine, Ser: 0.78 mg/dL (ref 0.44–1.00)
Glucose, Bld: 117 mg/dL — ABNORMAL HIGH (ref 65–99)
POTASSIUM: 4.1 mmol/L (ref 3.5–5.1)
Sodium: 137 mmol/L (ref 135–145)
TOTAL PROTEIN: 6.8 g/dL (ref 6.5–8.1)

## 2017-03-21 LAB — URINALYSIS, ROUTINE W REFLEX MICROSCOPIC
Bilirubin Urine: NEGATIVE
Glucose, UA: NEGATIVE mg/dL
HGB URINE DIPSTICK: NEGATIVE
Ketones, ur: NEGATIVE mg/dL
NITRITE: NEGATIVE
PROTEIN: NEGATIVE mg/dL
Specific Gravity, Urine: 1.01 (ref 1.005–1.030)
pH: 6 (ref 5.0–8.0)

## 2017-03-21 LAB — CBC WITH DIFFERENTIAL/PLATELET
Basophils Absolute: 0 10*3/uL (ref 0.0–0.1)
Basophils Relative: 0 %
EOS PCT: 1 %
Eosinophils Absolute: 0.1 10*3/uL (ref 0.0–0.7)
HEMATOCRIT: 42.2 % (ref 36.0–46.0)
Hemoglobin: 13.6 g/dL (ref 12.0–15.0)
LYMPHS PCT: 26 %
Lymphs Abs: 1.9 10*3/uL (ref 0.7–4.0)
MCH: 29.6 pg (ref 26.0–34.0)
MCHC: 32.2 g/dL (ref 30.0–36.0)
MCV: 91.9 fL (ref 78.0–100.0)
MONO ABS: 0.5 10*3/uL (ref 0.1–1.0)
MONOS PCT: 6 %
NEUTROS ABS: 4.9 10*3/uL (ref 1.7–7.7)
Neutrophils Relative %: 67 %
Platelets: 243 10*3/uL (ref 150–400)
RBC: 4.59 MIL/uL (ref 3.87–5.11)
RDW: 14.1 % (ref 11.5–15.5)
WBC: 7.3 10*3/uL (ref 4.0–10.5)

## 2017-03-21 LAB — URINALYSIS, MICROSCOPIC (REFLEX): RBC / HPF: NONE SEEN RBC/hpf (ref 0–5)

## 2017-03-21 MED ORDER — NYSTATIN 100000 UNIT/GM EX CREA: TOPICAL_CREAM | CUTANEOUS | 2 refills | Status: AC

## 2017-03-21 NOTE — ED Notes (Signed)
Pt was at urgent care Friday was prescribed nystain, pt the medication has ran out and pt groining area is still red and itching

## 2017-03-21 NOTE — Discharge Instructions (Signed)
Continue nystatin cream twice daily for the next week   See your doctor  Return to ER if she has worse rash, trouble urinating, severe pain, purulent discharge

## 2017-03-21 NOTE — ED Provider Notes (Signed)
[facility] DEPT Provider Note   CSN: [id number] Arrival date & time: 03/21/17  1450     History   Chief Complaint Chief Complaint  Patient presents with  . Leg Pain    HPI [name] is a 83 y.o. female history of COPD, hypertension, hyperlipidemia, here presenting with persistent rash in the groin area. Patient was seen at urgent care about 3 days ago and was diagnosed with possible pelvic candidiasis. She was put on nystatin cream and she ran out of the prescription. Daughter also try to put zinc oxide on it but she felt very itchy afterwards so she stopped using it. She also noticed that mother has been urinating very frequently but denies any dysuria or foul-smelling urine. Denies any purulent drainage from the area. Denies any fevers or chills or vomiting.    The history is provided by the patient and a relative.    Past Medical History:  Diagnosis Date  . Ascending aortic aneurysm ([place])    a. 08/2015 stable 5 cm Asc Ao Aneurysm.  . Bronchitis   . Carcinoid tumor   . COPD (chronic obstructive pulmonary disease) ([place])   . Coronary artery disease    a. Ca2+ noted on CT - no other clear documentation of CAD.  Marland Kitchen Difficulty in walking(719.7)   . Disturbance of skin sensation   . Edema   . Essential hypertension   . GERD (gastroesophageal reflux disease)   . Hypercholesterolemia   . Hyperlipidemia   . Mitral valve disease    a. 10/2015 Echo: Mod MS, mild MR.  . Moderate aortic insufficiency    a. 10/2015 Echo: EF 50-55%, no rwma, mod AI, mod MS, mild MR, sev dil LA, mild TR/PR, PASP 58mmHg.  . Pain in joint, forearm   . Pain in joint, hand   . Pain in joint, lower leg   . Pulmonary nodules    s. 08/2015 CT: stable lung nodules.  . Type II diabetes mellitus [facility])     Patient Active Problem List   Diagnosis Date Noted  . Lumbar degenerative disc disease 02/21/2017  . Spondylosis without myelopathy or radiculopathy, lumbar region 02/21/2017  . Iron deficiency  anemia 10/13/2016  . Venous ulcer of right leg ([place]) 03/16/2016  . Type II diabetes mellitus ([place])   . Ascending aortic aneurysm ([place])   . Primary osteoarthritis of both hands 01/23/2016  . Varicose veins of right lower extremity with complications 92/42/6834  . Open wound of lower leg 08/26/2015  . Venous ulcer ([place]) 08/26/2015  . Lymphedema of leg 07/23/2015  . Varicose veins of lower extremities with ulcer and inflammation ([place]) 07/23/2015  . Chronic venous insufficiency 07/23/2015  . Leg ulcer ([place]) 07/16/2015  . Ulcer of right lower leg ([place]) 07/12/2015  . Cellulitis of right leg 07/12/2015  . Hyponatremia 07/12/2015  . Chronic diastolic heart failure ([place]) 07/12/2015  . Right hip pain 01/07/2015  . Primary osteoarthritis of right hip 01/07/2015  . Hip contracture 01/07/2015  . AI (aortic incompetence) 08/07/2014  . Diastolic murmur 19/62/2297  . Asthma, mild intermittent 04/04/2013  . Leg varices 03/09/2013  . Asthma, cough variant 10/04/2012  . Eczema intertrigo 05/11/2012  . Basal cell papilloma 05/11/2012  . Carcinoid tumor 10/20/2011  . Allergic rhinitis 07/23/2011  . Airway hyperreactivity 07/23/2011  . Chronic cough 07/23/2011  . Diabetes mellitus type 2 with peripheral artery disease ([place]) 06/19/2009  . HYPERCHOLESTEROLEMIA 06/19/2009  . HYPERLIPIDEMIA 06/19/2009  . Essential hypertension 06/19/2009  . Coronary atherosclerosis  06/19/2009  . Aortic valve regurgitation 06/19/2009  . AORTIC ANEURYSM 06/19/2009  . BRONCHITIS 06/19/2009  . COPD (chronic obstructive pulmonary disease) ([place]) 06/19/2009  . GERD 06/19/2009  . EDEMA 06/19/2009  . 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 bronch with endobronchial biopies  09/15/2009  . COLONOSCOPY N/A 09/13/2013   Procedure: COLONOSCOPY;  Surgeon: Jeryl Columbia, MD;  Location: [facility] ENDOSCOPY;  Service: Endoscopy;  Laterality: N/A;  . endobronchial excision of  right upper lobe tumor with laser bronchi  10/06/2009  . fiberoptic bronch with endobronchial u/s  07/02/2010  . PARS PLANA VITRECTOMY     right eye  . PARS PLANA VITRECTOMY W/ ENDOPHOTOCOAGULATION     right eye  . posterior capsulectomy     right eye  . VIDEO BRONCHOSCOPY  12/09/2009    OB History    Gravida Para Term Preterm AB Living   2 1 1   1 1    SAB TAB Ectopic Multiple Live Births   1       1       Home Medications    Prior to Admission medications   Medication Sig Start Date End Date Taking? Authorizing Provider  albuterol (PROVENTIL HFA;VENTOLIN HFA) 108 (90 Base) MCG/ACT inhaler Inhale 1-2 puffs into the lungs every 6 (six) hours as needed for wheezing or shortness of breath. 12/02/16   Long, Wonda Olds, MD  amLODipine (NORVASC) 10 MG tablet TAKE 1 TABLET (10 MG TOTAL) BY MOUTH DAILY. 02/14/17   Josue Hector, MD  aspirin EC 81 MG tablet Take 81 mg by mouth every evening. 7 pm    [provider]  atorvastatin (LIPITOR) 10 MG tablet Take 10 mg by mouth at bedtime. 06/17/15   [provider]  FeFum-FePoly-FA-B Cmp-C-Biot (INTEGRA PLUS) CAPS TAKE ONE CAPSULE BY MOUTH EVERY DAY *NOT COVERED* 01/25/17   Curt Bears, MD  fluticasone (FLOVENT HFA) 110 MCG/ACT inhaler Inhale 2 puffs into the lungs 2 (two) times daily.    [provider]  furosemide (LASIX) 20 MG tablet TAKE 1 TABLET (20 MG TOTAL) BY MOUTH DAILY AS NEEDED FOR FLUID OR EDEMA. 10/15/16   Josue Hector, MD  glimepiride (AMARYL) 1 MG tablet Take 1 mg by mouth daily.     [provider]  hydrALAZINE (APRESOLINE) 50 MG tablet Take 1 tablet (50 mg total) by mouth 3 (three) times daily. 09/15/16   Josue Hector, MD  montelukast (SINGULAIR) 10 MG tablet Take 10 mg by mouth at bedtime. 06/04/15   [provider]  Multiple Vitamin (MULITIVITAMIN WITH MINERALS) TABS Take 1 tablet by mouth daily.    [provider]  nitroGLYCERIN (NITROSTAT) 0.4 MG SL tablet PLACE 1  TABLET UNDER THE TONGUE EVERY 5 MINUTES AS NEEDED FOR CHEST PAIN 09/14/16   Josue Hector, MD  nystatin cream (MYCOSTATIN) Apply to affected area 2 times daily 03/18/17   Barnet Glasgow, NP  olmesartan (BENICAR) 40 MG tablet TAKE 1 TABLET (40 MG TOTAL) BY MOUTH 2 (TWO) TIMES DAILY. 10/20/16   Josue Hector, MD  Omega-3 Fatty Acids (FISH OIL) 1200 MG CAPS Take 1 capsule by mouth daily.    [provider]  omeprazole (PRILOSEC) 20 MG capsule Take 20 mg by mouth daily before breakfast. 06/04/15   [provider]  pioglitazone (ACTOS) 15 MG tablet Take 7.5 mg by mouth daily.     [provider]  potassium chloride (K-DUR,KLOR-CON)  10 MEQ tablet Take 20 mEq by mouth daily.    [provider]  Probiotic Product (PROBIOTIC PO) Take 1 tablet by mouth daily.    [provider]  Spacer/Aero-Holding Chambers (AEROCHAMBER MINI CHAMBER) DEVI use as directed with your inhalers  04/27/11   [provider]  traMADol (ULTRAM) 50 MG tablet Take 2 tablets (100 mg total) by mouth every 12 (twelve) hours as needed. 02/21/17   Kirsteins, Luanna Salk, MD  triamcinolone cream (KENALOG) 0.1 % Apply 1 application topically 2 (two) times daily. 03/18/17   Barnet Glasgow, NP    Family History Family History  Problem Relation Age of Onset  . Hypertension Mother   . Hypertension Father   . Heart attack Neg Hx   . Stroke Neg Hx     Social History Social History  Substance Use Topics  . Smoking status: Never Smoker  . Smokeless tobacco: Never Used  . Alcohol use No     Allergies   Patient has no known allergies.   Review of Systems Review of Systems  Skin: Positive for rash.  All other systems reviewed and are negative.    Physical Exam Updated Vital Signs BP (!) 163/74   Pulse 95   Temp 97.7 F (36.5 C) (Oral)   Resp 17   Wt 67.6 kg (149 lb 1 oz)   SpO2 96%   BMI 29.11 kg/m   Physical Exam  Constitutional: She appears well-developed.    HENT:  Head: Normocephalic.  Eyes: Pupils are equal, round, and reactive to light.  Neck: Normal range of motion.  Cardiovascular: Normal rate.   Pulmonary/Chest: Effort normal and breath sounds normal. No respiratory distress. She has no wheezes.  Abdominal: Soft. Bowel sounds are normal. She exhibits no distension. There is no tenderness.  Genitourinary:  Genitourinary Comments: Some erythema along bilateral inner thighs, ? Mild vesicles. No obvious cellulitis. No purulent drainage or abscess   Musculoskeletal: Normal range of motion.  Neurological: She is alert.  Skin: Skin is warm. There is erythema.  Psychiatric: She has a normal mood and affect.  Nursing note and vitals reviewed.    ED Treatments / Results  Labs (all labs ordered are listed, but only abnormal results are displayed) Labs Reviewed  COMPREHENSIVE METABOLIC PANEL - Abnormal; Notable for the following:       Result Value   Glucose, Bld 117 (*)    BUN 23 (*)    All other components within normal limits  URINALYSIS, ROUTINE W REFLEX MICROSCOPIC - Abnormal; Notable for the following:    Leukocytes, UA TRACE (*)    All other components within normal limits  URINALYSIS, MICROSCOPIC (REFLEX) - Abnormal; Notable for the following:    Bacteria, UA RARE (*)    Squamous Epithelial / LPF 0-5 (*)    All other components within normal limits  URINE CULTURE  CBC WITH DIFFERENTIAL/PLATELET    EKG  EKG Interpretation None       Radiology No results found.  Procedures Procedures (including critical care time)  Medications Ordered in ED Medications - No data to display   Initial Impression / Assessment and Plan / ED Course  I have reviewed the triage vital signs and the nursing notes.  Pertinent labs & imaging results that were available during my care of the patient were reviewed by me and considered in my medical decision making (see chart for details).     [name] is a 83 y.o. female here with  rash in the pelvic area. I think likely resolving candidiasis. Has some urinary symptoms, will get UA, labs.   8:37 PM WBC nl. UA showed no UTI. Will give another course of nystatin cream. Will dc home.    Final Clinical Impressions(s) / ED Diagnoses   Final diagnoses:  None    New Prescriptions New Prescriptions   No medications on file     Drenda Freeze, MD 03/21/17 2037

## 2017-03-21 NOTE — ED Triage Notes (Signed)
Pt here c/o bil inner thigh redness & itching, pt family reports being seen at [facility] Friday & given topical cream with itching persistent, pt c/o bil upper extremity itching today, denies skin drainage

## 2017-03-21 NOTE — ED Notes (Signed)
PT states understanding of care given, follow up care, and medication prescribed. PT ambulated from ED to car with a steady gait. 

## 2017-03-21 NOTE — ED Notes (Signed)
Pt called no answer 

## 2017-03-23 ENCOUNTER — Other Ambulatory Visit: Payer: Self-pay | Admitting: Orthopedic Surgery

## 2017-03-23 ENCOUNTER — Other Ambulatory Visit: Payer: Self-pay | Admitting: Obstetrics & Gynecology

## 2017-03-23 DIAGNOSIS — Z1231 Encounter for screening mammogram for malignant neoplasm of breast: Secondary | ICD-10-CM

## 2017-03-23 DIAGNOSIS — M1611 Unilateral primary osteoarthritis, right hip: Secondary | ICD-10-CM

## 2017-03-23 LAB — URINE CULTURE: Culture: 20000 — AB

## 2017-03-24 ENCOUNTER — Telehealth: Payer: Self-pay | Admitting: *Deleted

## 2017-03-24 NOTE — Telephone Encounter (Signed)
Post ED Visit - Positive Culture Follow-up  Culture report reviewed by antimicrobial stewardship pharmacist:  []  Elenor Quinones, Pharm.D. []  Heide Guile, Pharm.D., BCPS AQ-ID []  Parks Neptune, Pharm.D., BCPS []  Alycia Rossetti, Pharm.D., BCPS []  Amelia, Pharm.D., BCPS, AAHIVP []  Legrand Como, Pharm.D., BCPS, AAHIVP []  Salome Arnt, PharmD, BCPS []  Dimitri Ped, PharmD, BCPS []  Vincenza Hews, PharmD, BCPS  Positive urine culture with no dysuria, fevers, or chills. No further patient follow-up is required at this time per Arlean Hopping, PA-C.  Harlon Flor Talley 03/24/2017, 2:12 PM

## 2017-03-29 ENCOUNTER — Ambulatory Visit: Payer: Medicare Other | Admitting: Vascular Surgery

## 2017-03-29 ENCOUNTER — Encounter (HOSPITAL_COMMUNITY): Payer: Medicare Other

## 2017-03-30 DIAGNOSIS — L304 Erythema intertrigo: Secondary | ICD-10-CM | POA: Diagnosis not present

## 2017-03-31 DIAGNOSIS — E7800 Pure hypercholesterolemia, unspecified: Secondary | ICD-10-CM | POA: Diagnosis not present

## 2017-03-31 DIAGNOSIS — E1169 Type 2 diabetes mellitus with other specified complication: Secondary | ICD-10-CM | POA: Diagnosis not present

## 2017-04-01 ENCOUNTER — Ambulatory Visit
Admission: RE | Admit: 2017-04-01 | Discharge: 2017-04-01 | Disposition: A | Discharge status: Home / Self Care | Payer: Medicare Other | Source: Ambulatory Visit | Attending: Orthopedic Surgery | Admitting: Orthopedic Surgery

## 2017-04-01 DIAGNOSIS — M1611 Unilateral primary osteoarthritis, right hip: Secondary | ICD-10-CM

## 2017-04-01 MED ORDER — IOPAMIDOL (ISOVUE-M 300) INJECTION 61%
1.0000 mL | Freq: Once | INTRAMUSCULAR | Status: AC | PRN
  Administered 2017-04-01: 1 mL via INTRA_ARTICULAR

## 2017-04-01 MED ORDER — TRIAMCINOLONE ACETONIDE 40 MG/ML IJ SUSP (RADIOLOGY)
60.0000 mg | Freq: Once | INTRAMUSCULAR | Status: AC
  Administered 2017-04-01: 60 mg via INTRA_ARTICULAR

## 2017-04-04 DIAGNOSIS — I2510 Atherosclerotic heart disease of native coronary artery without angina pectoris: Secondary | ICD-10-CM | POA: Diagnosis not present

## 2017-04-04 DIAGNOSIS — E1159 Type 2 diabetes mellitus with other circulatory complications: Secondary | ICD-10-CM | POA: Diagnosis not present

## 2017-04-04 DIAGNOSIS — L97319 Non-pressure chronic ulcer of right ankle with unspecified severity: Secondary | ICD-10-CM | POA: Diagnosis not present

## 2017-04-04 DIAGNOSIS — I350 Nonrheumatic aortic (valve) stenosis: Secondary | ICD-10-CM | POA: Diagnosis not present

## 2017-04-04 DIAGNOSIS — I83213 Varicose veins of right lower extremity with both ulcer of ankle and inflammation: Secondary | ICD-10-CM | POA: Diagnosis not present

## 2017-04-04 DIAGNOSIS — E7800 Pure hypercholesterolemia, unspecified: Secondary | ICD-10-CM | POA: Diagnosis not present

## 2017-04-04 DIAGNOSIS — I10 Essential (primary) hypertension: Secondary | ICD-10-CM | POA: Diagnosis not present

## 2017-04-05 ENCOUNTER — Ambulatory Visit: Payer: Medicare Other | Admitting: Thoracic Surgery (Cardiothoracic Vascular Surgery)

## 2017-04-05 ENCOUNTER — Other Ambulatory Visit: Payer: Medicare Other

## 2017-04-05 ENCOUNTER — Inpatient Hospital Stay: Admission: RE | Admit: 2017-04-05 | Payer: Medicare Other | Source: Ambulatory Visit

## 2017-04-06 ENCOUNTER — Encounter: Payer: Self-pay | Admitting: Vascular Surgery

## 2017-04-07 DIAGNOSIS — L97909 Non-pressure chronic ulcer of unspecified part of unspecified lower leg with unspecified severity: Secondary | ICD-10-CM | POA: Diagnosis not present

## 2017-04-07 DIAGNOSIS — J4520 Mild intermittent asthma, uncomplicated: Secondary | ICD-10-CM | POA: Diagnosis not present

## 2017-04-07 DIAGNOSIS — M1990 Unspecified osteoarthritis, unspecified site: Secondary | ICD-10-CM | POA: Diagnosis not present

## 2017-04-07 DIAGNOSIS — E1159 Type 2 diabetes mellitus with other circulatory complications: Secondary | ICD-10-CM | POA: Diagnosis not present

## 2017-04-07 DIAGNOSIS — I872 Venous insufficiency (chronic) (peripheral): Secondary | ICD-10-CM | POA: Diagnosis not present

## 2017-04-07 DIAGNOSIS — G252 Other specified forms of tremor: Secondary | ICD-10-CM | POA: Diagnosis not present

## 2017-04-07 DIAGNOSIS — I10 Essential (primary) hypertension: Secondary | ICD-10-CM | POA: Diagnosis not present

## 2017-04-07 DIAGNOSIS — C3490 Malignant neoplasm of unspecified part of unspecified bronchus or lung: Secondary | ICD-10-CM | POA: Diagnosis not present

## 2017-04-07 DIAGNOSIS — E1169 Type 2 diabetes mellitus with other specified complication: Secondary | ICD-10-CM | POA: Diagnosis not present

## 2017-04-07 DIAGNOSIS — I83811 Varicose veins of right lower extremities with pain: Secondary | ICD-10-CM | POA: Diagnosis not present

## 2017-04-07 DIAGNOSIS — R32 Unspecified urinary incontinence: Secondary | ICD-10-CM | POA: Diagnosis not present

## 2017-04-07 DIAGNOSIS — R251 Tremor, unspecified: Secondary | ICD-10-CM | POA: Diagnosis not present

## 2017-04-07 DIAGNOSIS — I714 Abdominal aortic aneurysm, without rupture: Secondary | ICD-10-CM | POA: Diagnosis not present

## 2017-04-07 DIAGNOSIS — Z7984 Long term (current) use of oral hypoglycemic drugs: Secondary | ICD-10-CM | POA: Diagnosis not present

## 2017-04-11 ENCOUNTER — Other Ambulatory Visit: Payer: Self-pay

## 2017-04-11 DIAGNOSIS — I8390 Asymptomatic varicose veins of unspecified lower extremity: Secondary | ICD-10-CM

## 2017-04-12 ENCOUNTER — Ambulatory Visit
Admission: RE | Admit: 2017-04-12 | Discharge: 2017-04-12 | Disposition: A | Discharge status: Home / Self Care | Payer: Medicare Other | Source: Ambulatory Visit | Attending: Obstetrics & Gynecology | Admitting: Obstetrics & Gynecology

## 2017-04-12 DIAGNOSIS — Z1231 Encounter for screening mammogram for malignant neoplasm of breast: Secondary | ICD-10-CM | POA: Diagnosis not present

## 2017-04-13 ENCOUNTER — Encounter: Payer: Self-pay | Admitting: Thoracic Surgery (Cardiothoracic Vascular Surgery)

## 2017-04-13 ENCOUNTER — Ambulatory Visit (INDEPENDENT_AMBULATORY_CARE_PROVIDER_SITE_OTHER): Payer: Medicare Other | Admitting: Thoracic Surgery (Cardiothoracic Vascular Surgery)

## 2017-04-13 ENCOUNTER — Ambulatory Visit
Admission: RE | Admit: 2017-04-13 | Discharge: 2017-04-13 | Disposition: A | Discharge status: Home / Self Care | Payer: Medicare Other | Source: Ambulatory Visit | Attending: Thoracic Surgery (Cardiothoracic Vascular Surgery) | Admitting: Thoracic Surgery (Cardiothoracic Vascular Surgery)

## 2017-04-13 VITALS — BP 150/65 | HR 70 | Resp 20 | Ht 60.0 in | Wt 152.0 lb

## 2017-04-13 DIAGNOSIS — I712 Thoracic aortic aneurysm, without rupture, unspecified: Secondary | ICD-10-CM

## 2017-04-13 DIAGNOSIS — R918 Other nonspecific abnormal finding of lung field: Secondary | ICD-10-CM | POA: Diagnosis not present

## 2017-04-13 DIAGNOSIS — Z8639 Personal history of other endocrine, nutritional and metabolic disease: Secondary | ICD-10-CM

## 2017-04-13 MED ORDER — IOPAMIDOL (ISOVUE-370) INJECTION 76%
75.0000 mL | Freq: Once | INTRAVENOUS | Status: AC | PRN
  Administered 2017-04-13: 75 mL via INTRAVENOUS

## 2017-04-13 NOTE — Progress Notes (Signed)
[address]       [place] [address]             [phone]    HPI: [name] returns today for a follow-up visit  She is an 83 year old woman with an incredibly long list of medical problems including a 5 cm ascending aneurysm. She had a carcinoid tumor resected by Dr. Arlyce Dice many years ago. She has multiple lung nodules which have been stable over time. Among her medical problems are diabetes, obesity, hypertension, hyperlipidemia, coronary artery disease, aortic insufficiency, degenerative joint disease, and venous insufficiency.  She is accompanied by her daughter as well as a Nurse, adult.  Her primary complaint at present is right hip pain. She has had injections that has not been successful. She is being followed by Dr. Alvan Dame and her daughter says they are considering surgery. Her activities extremely limited. She does get short of breath with activity. She has not been having chest pain.  I have told the patient and family multiple times I do not think she is a candidate for surgical repair of her ascending aorta and proximal arch. Past Medical History:  Diagnosis Date  . Ascending aortic aneurysm ([place])    a. 08/2015 stable 5 cm Asc Ao Aneurysm.  . Bronchitis   . Carcinoid tumor   . COPD (chronic obstructive pulmonary disease) ([place])   . Coronary artery disease    a. Ca2+ noted on CT - no other clear documentation of CAD.  Marland Kitchen Difficulty in walking(719.7)   . Disturbance of skin sensation   . Edema   . Essential hypertension   . GERD (gastroesophageal reflux disease)   . Hypercholesterolemia   . Hyperlipidemia   . Mitral valve disease    a. 10/2015 Echo: Mod MS, mild MR.  . Moderate aortic insufficiency    a. 10/2015 Echo: EF 50-55%, no rwma, mod AI, mod MS, mild MR, sev dil LA, mild TR/PR, PASP 66mmHg.  . Pain in joint, forearm   . Pain in joint, hand   . Pain in joint, lower leg   . Pulmonary nodules    s. 08/2015 CT: stable lung  nodules.  . Type II diabetes mellitus ([place])     Current Outpatient Prescriptions  Medication Sig Dispense Refill  . albuterol (PROVENTIL HFA;VENTOLIN HFA) 108 (90 Base) MCG/ACT inhaler Inhale 1-2 puffs into the lungs every 6 (six) hours as needed for wheezing or shortness of breath. 1 Inhaler 0  . amLODipine (NORVASC) 10 MG tablet TAKE 1 TABLET (10 MG TOTAL) BY MOUTH DAILY. 30 tablet 6  . aspirin EC 81 MG tablet Take 81 mg by mouth every evening. 7 pm    . atorvastatin (LIPITOR) 10 MG tablet Take 10 mg by mouth at bedtime.  12  . FeFum-FePoly-FA-B Cmp-C-Biot (INTEGRA PLUS) CAPS TAKE ONE CAPSULE BY MOUTH EVERY DAY *NOT COVERED* 30 capsule 5  . fluticasone (FLOVENT HFA) 110 MCG/ACT inhaler Inhale 2 puffs into the lungs 2 (two) times daily.    . furosemide (LASIX) 20 MG tablet TAKE 1 TABLET (20 MG TOTAL) BY MOUTH DAILY AS NEEDED FOR FLUID OR EDEMA. 30 tablet 3  . glimepiride (AMARYL) 1 MG tablet Take 1 mg by mouth daily.     . hydrALAZINE (APRESOLINE) 50 MG tablet Take 1 tablet (50 mg total) by mouth 3 (three) times daily. 270 tablet 3  . montelukast (SINGULAIR) 10 MG tablet Take 10 mg by mouth at bedtime.  1  .  Multiple Vitamin (MULITIVITAMIN WITH MINERALS) TABS Take 1 tablet by mouth daily.    . nitroGLYCERIN (NITROSTAT) 0.4 MG SL tablet PLACE 1 TABLET UNDER THE TONGUE EVERY 5 MINUTES AS NEEDED FOR CHEST PAIN 25 tablet 2  . nystatin cream (MYCOSTATIN) Apply to affected area 2 times daily 60 g 2  . olmesartan (BENICAR) 40 MG tablet TAKE 1 TABLET (40 MG TOTAL) BY MOUTH 2 (TWO) TIMES DAILY. 180 tablet 3  . Omega-3 Fatty Acids (FISH OIL) 1200 MG CAPS Take 1 capsule by mouth daily.    Marland Kitchen omeprazole (PRILOSEC) 20 MG capsule Take 20 mg by mouth daily before breakfast.  5  . pioglitazone (ACTOS) 15 MG tablet Take 7.5 mg by mouth daily.     . potassium chloride (K-DUR,KLOR-CON) 10 MEQ tablet Take 20 mEq by mouth daily.    . Probiotic Product (PROBIOTIC PO) Take 1 tablet by mouth daily.    Marland Kitchen  Spacer/Aero-Holding Chambers (AEROCHAMBER MINI CHAMBER) DEVI use as directed with your inhalers     . traMADol (ULTRAM) 50 MG tablet Take 2 tablets (100 mg total) by mouth every 12 (twelve) hours as needed. 120 tablet 5  . triamcinolone cream (KENALOG) 0.1 % Apply 1 application topically 2 (two) times daily. 30 g 0   No current facility-administered medications for this visit.     Physical Exam BP (!) 150/65   Pulse 70   Resp 20   Ht 5' (1.524 m)   Wt 152 lb (68.9 kg)   SpO2 94% Comment: RA  BMI 29.69 kg/m  Elderly, frail-appearing obese woman in no acute distress Alert and oriented Cardiac regular rate and rhythm with a 2/6 systolic and diastolic murmurs Lungs diminished breath sounds bilaterally, no wheezing Extremities- compression stockings in place, 1+ edema  Diagnostic Tests: CT ANGIOGRAPHY CHEST WITH CONTRAST  TECHNIQUE: Multidetector CT imaging of the chest was performed using the standard protocol during bolus administration of intravenous contrast. Multiplanar CT image reconstructions and MIPs were obtained to evaluate the vascular anatomy.  CONTRAST:  75 mL of Isovue 370 intravenously.  COMPARISON:  CT scan of September 15, 2015.  FINDINGS: Cardiovascular: Atherosclerosis thoracic of aorta is noted without dissection. Ascending thoracic aortic aneurysm is noted measuring 5.4 cm. Transverse aortic arch measures 3.3 cm. Proximal descending thoracic aorta measures 2.9 cm. Mild cardiomegaly is noted. No pericardial effusion is noted. Tortuosity of great vessels is noted without significant stenosis.  Mediastinum/Nodes: No enlarged mediastinal, hilar, or axillary lymph nodes. Thyroid gland, trachea, and esophagus demonstrate no significant findings.  Lungs/Pleura: No pneumothorax or pleural effusion is noted. Stable bilateral pulmonary nodules are noted.  Upper Abdomen: No acute abnormality.  Musculoskeletal: No chest wall abnormality. No acute or  significant osseous findings.  Review of the MIP images confirms the above findings.  IMPRESSION: 5.4 cm ascending thoracic aortic aneurysm which is not significantly changed compared to prior exam based on my own measurements. Recommend semi-annual imaging followup by CTA or MRA and referral to cardiothoracic surgery if not already obtained. This recommendation follows 2010 ACCF/AHA/AATS/ACR/ASA/SCA/SCAI/SIR/STS/SVM Guidelines for the Diagnosis and Management of Patients With Thoracic Aortic Disease. Circulation. 2010; 121: X528-U132.  Stable bilateral pulmonary nodules.  Aortic Atherosclerosis (ICD10-I70.0).   Electronically Signed   By: Marijo Conception, M.D.   On: 04/13/2017 16:23 I personally reviewed the CT chest and concur with the findings as noted. My measurements on the actual scans come out to about 5-5.1 cm as opposed to 5.4. The aneurysm does not appear changed from her  most recent CT.  Impression: [name] is an 83 year old woman with a 5 cm ascending aneurysm extending into the proximal arch and with aortic insufficiency. In my opinion she is not a candidate for aortic root, ascending aorta and possible arch replacement. She is at risk for rupture or dissection given the size of the aorta. She certainly would not be a candidate for emergency surgery.  Lung nodules- unchanged from her most recent scan 18 months ago.  Right hip pain- being managed by Dr. Alvan Dame. Her daughter asked my opinion about surgical repair. Mild opinion it would be extremely high risk and there have been reports of increased rates of aneurysm rupture or dissection in the perioperative period. Only the patient can decide if she is willing to accept the risks. I do think she would need formal cardiology clearance prior to any major anesthetic but would defer that to anesthesiology.  Plan: I will plan to see her back in the year with a CT chest. There is no reason to use IV contrast.  Melrose Nakayama, MD [facility] [phone]

## 2017-04-19 ENCOUNTER — Encounter: Payer: Self-pay | Admitting: Vascular Surgery

## 2017-04-19 ENCOUNTER — Ambulatory Visit (HOSPITAL_COMMUNITY)
Admission: RE | Admit: 2017-04-19 | Discharge: 2017-04-19 | Disposition: A | Discharge status: Home / Self Care | Payer: Medicare Other | Source: Ambulatory Visit | Attending: Vascular Surgery | Admitting: Vascular Surgery

## 2017-04-19 ENCOUNTER — Ambulatory Visit (INDEPENDENT_AMBULATORY_CARE_PROVIDER_SITE_OTHER): Payer: Medicare Other | Admitting: Vascular Surgery

## 2017-04-19 VITALS — BP 124/63 | HR 94 | Temp 97.8°F | Resp 16 | Ht 64.0 in | Wt 154.0 lb

## 2017-04-19 DIAGNOSIS — I8390 Asymptomatic varicose veins of unspecified lower extremity: Secondary | ICD-10-CM | POA: Diagnosis not present

## 2017-04-19 DIAGNOSIS — I83891 Varicose veins of right lower extremities with other complications: Secondary | ICD-10-CM | POA: Diagnosis not present

## 2017-04-19 NOTE — Progress Notes (Signed)
Subjective:     Patient ID: [name], female   DOB: 1932-02-01, 83 y.o.   MRN: [id number]  HPI This 83 year old female who speaks [place] but not English is accompanied by her daughter who serves as the Optometrist. She was seen by me on 3 occasions and 2017. She initially had stasis ulcer in the right lower extremity which healed and the wound center. She was found to have some reflux in the right great saphenous vein with borderline in size. At one point laser ablation of the right great saphenous vein because of her history of stasis ulcers was recommended but she declined. She now complains of right hip discomfort. She has had no recurrence of the ulcer. She has been seeing Dr. Catalina Antigua: Alvan Dame who has recommended right hip replacement. She is currently having injections. She has no history of DVT thrombophlebitis stasis ulcers or bleeding.  Past Medical History:  Diagnosis Date  . Ascending aortic aneurysm ([place])    a. 08/2015 stable 5 cm Asc Ao Aneurysm.  . Bronchitis   . Carcinoid tumor   . COPD (chronic obstructive pulmonary disease) ([place])   . Coronary artery disease    a. Ca2+ noted on CT - no other clear documentation of CAD.  Marland Kitchen Difficulty in walking(719.7)   . Disturbance of skin sensation   . Edema   . Essential hypertension   . GERD (gastroesophageal reflux disease)   . Hypercholesterolemia   . Hyperlipidemia   . Mitral valve disease    a. 10/2015 Echo: Mod MS, mild MR.  . Moderate aortic insufficiency    a. 10/2015 Echo: EF 50-55%, no rwma, mod AI, mod MS, mild MR, sev dil LA, mild TR/PR, PASP 76mmHg.  . Pain in joint, forearm   . Pain in joint, hand   . Pain in joint, lower leg   . Pulmonary nodules    s. 08/2015 CT: stable lung nodules.  . Type II diabetes mellitus ([place])     Social History  Substance Use Topics  . Smoking status: Never Smoker  . Smokeless tobacco: Never Used  . Alcohol use No    Family History  Problem Relation Age of Onset  . Hypertension Mother    . Hypertension Father   . Heart attack Neg Hx   . Stroke Neg Hx     No Known Allergies   Current Outpatient Prescriptions:  .  albuterol (PROVENTIL HFA;VENTOLIN HFA) 108 (90 Base) MCG/ACT inhaler, Inhale 1-2 puffs into the lungs every 6 (six) hours as needed for wheezing or shortness of breath., Disp: 1 Inhaler, Rfl: 0 .  amLODipine (NORVASC) 10 MG tablet, TAKE 1 TABLET (10 MG TOTAL) BY MOUTH DAILY., Disp: 30 tablet, Rfl: 6 .  aspirin EC 81 MG tablet, Take 81 mg by mouth every evening. 7 pm, Disp: , Rfl:  .  atorvastatin (LIPITOR) 10 MG tablet, Take 10 mg by mouth at bedtime., Disp: , Rfl: 12 .  FeFum-FePoly-FA-B Cmp-C-Biot (INTEGRA PLUS) CAPS, TAKE ONE CAPSULE BY MOUTH EVERY DAY *NOT COVERED*, Disp: 30 capsule, Rfl: 5 .  fluticasone (FLOVENT HFA) 110 MCG/ACT inhaler, Inhale 2 puffs into the lungs 2 (two) times daily., Disp: , Rfl:  .  furosemide (LASIX) 20 MG tablet, TAKE 1 TABLET (20 MG TOTAL) BY MOUTH DAILY AS NEEDED FOR FLUID OR EDEMA., Disp: 30 tablet, Rfl: 3 .  glimepiride (AMARYL) 1 MG tablet, Take 1 mg by mouth daily. , Disp: , Rfl:  .  hydrALAZINE (APRESOLINE) 50 MG tablet, Take 1  tablet (50 mg total) by mouth 3 (three) times daily., Disp: 270 tablet, Rfl: 3 .  montelukast (SINGULAIR) 10 MG tablet, Take 10 mg by mouth at bedtime., Disp: , Rfl: 1 .  Multiple Vitamin (MULITIVITAMIN WITH MINERALS) TABS, Take 1 tablet by mouth daily., Disp: , Rfl:  .  nitroGLYCERIN (NITROSTAT) 0.4 MG SL tablet, PLACE 1 TABLET UNDER THE TONGUE EVERY 5 MINUTES AS NEEDED FOR CHEST PAIN, Disp: 25 tablet, Rfl: 2 .  nystatin cream (MYCOSTATIN), Apply to affected area 2 times daily, Disp: 60 g, Rfl: 2 .  olmesartan (BENICAR) 40 MG tablet, TAKE 1 TABLET (40 MG TOTAL) BY MOUTH 2 (TWO) TIMES DAILY., Disp: 180 tablet, Rfl: 3 .  Omega-3 Fatty Acids (FISH OIL) 1200 MG CAPS, Take 1 capsule by mouth daily., Disp: , Rfl:  .  omeprazole (PRILOSEC) 20 MG capsule, Take 20 mg by mouth daily before breakfast., Disp: , Rfl:  5 .  pioglitazone (ACTOS) 15 MG tablet, Take 7.5 mg by mouth daily. , Disp: , Rfl:  .  potassium chloride (K-DUR,KLOR-CON) 10 MEQ tablet, Take 20 mEq by mouth daily., Disp: , Rfl:  .  Probiotic Product (PROBIOTIC PO), Take 1 tablet by mouth daily., Disp: , Rfl:  .  Spacer/Aero-Holding Chambers (AEROCHAMBER MINI CHAMBER) DEVI, use as directed with your inhalers , Disp: , Rfl:  .  traMADol (ULTRAM) 50 MG tablet, Take 2 tablets (100 mg total) by mouth every 12 (twelve) hours as needed., Disp: 120 tablet, Rfl: 5 .  triamcinolone cream (KENALOG) 0.1 %, Apply 1 application topically 2 (two) times daily., Disp: 30 g, Rfl: 0  Vitals:   04/19/17 1359  BP: 124/63  Pulse: 94  Resp: 16  Temp: 97.8 F (36.6 C)  SpO2: 99%  Weight: 154 lb (69.9 kg)  Height: 5\' 4"  (1.626 m)    Body mass index is 26.43 kg/m.         Review of Systems Denies chest pain, dyspnea on exertion, PND, orthopnea, hemoptysis, claudication    Objective:   Physical Exam BP 124/63 (BP Location: Left Arm, Patient Position: Sitting, Cuff Size: Large)   Pulse 94   Temp 97.8 F (36.6 C)   Resp 16   Ht 5\' 4"  (1.626 m)   Wt 154 lb (69.9 kg)   SpO2 99%   BMI 26.43 kg/m   Gen. well-developed well-nourished female no apparent distress alert and oriented 3-speaks [place] Lungs no rhonchi or wheezing Right leg with spider veins and reticular veins medially and laterally. Area of healed ulceration lateral aspect right lower leg with no active ulcer. No distal edema noted today. 3+ dorsalis pedis pulse palpable.  Today I ordered a venous duplex exam of the right leg which I reviewed and interpreted. There is no DVT. There is very slight reflux in the right great saphenous vein in the caliber of the vein is small. Similar findings on the ultrasound in the left leg with no DVT in either leg     Assessment:     Right hip discomfort-not vascular in origin Scattered small varicose veins not causing patient's symptoms     Plan:     Do not recommend any vascular intervention and recommended patient follow-up with Dr. Alvan Dame regarding her right hip pathology

## 2017-04-21 NOTE — Progress Notes (Signed)
Office Visit    Patient Name: [name] Date of Encounter: 04/22/2017  Primary Care Provider:  Apolonio Schneiders, MD Primary Cardiologist:  P. Johnsie Cancel, MD   Chief Complaint    83 year old female with history of hypertension, mitral and aortic valvular disease, and descending aortic aneurysm, who presents for follow-up.  Past Medical History    Past Medical History:  Diagnosis Date  . Ascending aortic aneurysm ([place])    a. 08/2015 stable 5 cm Asc Ao Aneurysm.  . Bronchitis   . Carcinoid tumor   . COPD (chronic obstructive pulmonary disease) ([place])   . Coronary artery disease    a. Ca2+ noted on CT - no other clear documentation of CAD.  Marland Kitchen Difficulty in walking(719.7)   . Disturbance of skin sensation   . Edema   . Essential hypertension   . GERD (gastroesophageal reflux disease)   . Hypercholesterolemia   . Hyperlipidemia   . Mitral valve disease    a. 10/2015 Echo: Mod MS, mild MR.  . Moderate aortic insufficiency    a. 10/2015 Echo: EF 50-55%, no rwma, mod AI, mod MS, mild MR, sev dil LA, mild TR/PR, PASP 8mmHg.  . Pain in joint, forearm   . Pain in joint, hand   . Pain in joint, lower leg   . Pulmonary nodules    s. 08/2015 CT: stable lung nodules.  . Type II diabetes mellitus ([place])    Past Surgical History:  Procedure Laterality Date  . bronch with endobronchial biopies  09/15/2009  . COLONOSCOPY N/A 09/13/2013   Procedure: COLONOSCOPY;  Surgeon: Jeryl Columbia, MD;  Location: [facility];  Service: Endoscopy;  Laterality: N/A;  . endobronchial excision of right upper lobe tumor with laser bronchi  10/06/2009  . fiberoptic bronch with endobronchial u/s  07/02/2010  . PARS PLANA VITRECTOMY     right eye  . PARS PLANA VITRECTOMY W/ ENDOPHOTOCOAGULATION     right eye  . posterior capsulectomy     right eye  . VIDEO BRONCHOSCOPY  12/09/2009    Allergies  No Known Allergies  History of Present Illness    83 year old female with the above complex past  medical history. She has an ascending aortic aneurysm that has been stable at 5 cm for some time, and is followed closely by thoracic surgery. She also has aortic and mitral valve disease. She is not felt to be a candidate for surgery and thus has been conservatively managed over the years. She also has a history of labile hypertension and has been on a variety of medication regimens over the years. Her daughter is with her today and assists in translation and drives the conversation.  The patient offers no complaints and denies chest pain or dyspnea. There is no history of PND, orthopnea, dizziness, syncope, or early satiety. She does have chronic right greater than left lower extremity edema and is being evaluated in vein clinic for laser ablation of the right great saphenous vein to facilitate wound healing on that side and also help prevent future stasis ulcers. Reviewed duplex from 04/19/17 and she has bilateral refux in both common femoral and SFJ   Echo 11/18/15:  Study Conclusions  - Left ventricle: The cavity size was mildly dilated. There was   mild concentric hypertrophy. Systolic function was normal. The   estimated ejection fraction was in the range of 50% to 55%. Wall   motion was normal; there were no regional wall motion   abnormalities. The  study is not technically sufficient to allow   evaluation of LV diastolic function. - Aortic valve: There was moderate regurgitation. Valve area (VTI):   1.96 cm^2. Valve area (Vmax): 2.08 cm^2. Valve area (Vmean): 1.87   cm^2. - Mitral valve: Severely calcified annulus. Mobility was   restricted. The findings are consistent with moderate stenosis.   There was mild regurgitation. Valve area by pressure half-time:   2.1 cm^2. Valve area by continuity equation (using LVOT flow):   2.93 cm^2. - Left atrium: The atrium was severely dilated. - Right ventricle: The cavity size was mildly dilated. Wall   thickness was normal. Systolic function was  normal. - Right atrium: The atrium was mildly dilated. Central venous   pressure (est): 15 mm Hg. - Tricuspid valve: There was mild regurgitation. - Pulmonic valve: There was trivial regurgitation. - Pulmonary arteries: Systolic pressure was severely increased. PA   peak pressure: 61 mm Hg (S). - Inferior vena cava: The vessel was dilated. The respirophasic   diameter changes were blunted (< 50%), consistent with elevated   central venous pressure.  Home Medications    Prior to Admission medications   Medication Sig Start Date End Date Taking? Authorizing Provider  albuterol (PROVENTIL HFA;VENTOLIN HFA) 108 (90 Base) MCG/ACT inhaler Inhale 2 puffs into the lungs every 6 (six) hours as needed for wheezing or shortness of breath.   Yes Historical Provider, MD  amLODipine (NORVASC) 10 MG tablet TAKE 1 TABLET (10 MG TOTAL) BY MOUTH DAILY. 01/27/16  Yes Josue Hector, MD  aspirin EC 81 MG tablet Take 81 mg by mouth every evening. 7 pm   Yes Historical Provider, MD  atorvastatin (LIPITOR) 10 MG tablet Take 10 mg by mouth at bedtime. 06/17/15  Yes Historical Provider, MD  BENICAR 40 MG tablet TAKE 1 TABLET (40 MG TOTAL) BY MOUTH 2 (TWO) TIMES DAILY. 10/21/15  Yes Josue Hector, MD  FeFum-FePoly-FA-B Cmp-C-Biot (INTEGRA PLUS) CAPS TAKE ONE CAPSULE BY MOUTH EVERY DAY *NOT COVERED* 12/22/15  Yes Curt Bears, MD  fluticasone (FLOVENT HFA) 110 MCG/ACT inhaler Inhale 2 puffs into the lungs 2 (two) times daily.   Yes Historical Provider, MD  furosemide (LASIX) 20 MG tablet Take 1 tablet (20 mg total) by mouth daily as needed for fluid or edema. 08/29/15  Yes Josue Hector, MD  glimepiride (AMARYL) 1 MG tablet Take 1 mg by mouth daily.    Yes Historical Provider, MD  hydrALAZINE (APRESOLINE) 50 MG tablet Take 50 mg by mouth 3 (three) times daily.    Yes Historical Provider, MD  montelukast (SINGULAIR) 10 MG tablet Take 10 mg by mouth at bedtime. 06/04/15  Yes Historical Provider, MD  Multiple Vitamin  (MULITIVITAMIN WITH MINERALS) TABS Take 1 tablet by mouth daily.   Yes Historical Provider, MD  nitroGLYCERIN (NITROSTAT) 0.4 MG SL tablet Place 0.4 mg under the tongue every 5 (five) minutes as needed for chest pain (3 DOSES MAX).   Yes Historical Provider, MD  Omega-3 Fatty Acids (FISH OIL) 1200 MG CAPS Take 1 capsule by mouth daily.   Yes Historical Provider, MD  omeprazole (PRILOSEC) 20 MG capsule Take 20 mg by mouth daily before breakfast. 06/04/15  Yes Historical Provider, MD  pioglitazone (ACTOS) 15 MG tablet Take 7.5 mg by mouth daily.    Yes Historical Provider, MD  potassium chloride (K-DUR,KLOR-CON) 10 MEQ tablet Take 20 mEq by mouth daily.   Yes Historical Provider, MD  Probiotic Product (PROBIOTIC PO) Take 1 tablet by mouth  daily.   Yes Historical Provider, MD  Spacer/Aero-Holding Chambers (AEROCHAMBER MINI CHAMBER) DEVI use as directed with your inhalers  04/27/11  Yes Historical Provider, MD    Review of Systems     as above, patient has been doing recently well. She has chronic right lower extremity edema and is followed in wound clinic with improved healing. No recent history of chest pain, dyspnea, PND, orthopnea or dizziness, syncope, or early satiety.  All other systems reviewed and are otherwise negative except as noted above.  Physical Exam    VS:  BP (!) 114/56   Pulse 87   Ht 5' (1.524 m)   Wt 68.1 kg (150 lb 1.9 oz)   SpO2 95%   BMI 29.32 kg/m  , BMI Body mass index is 29.32 kg/m. Affect appropriate Elderly [place] female  HEENT: normal Neck supple with no adenopathy JVP normal no bruits no thyromegaly Lungs clear with no wheezing and good diaphragmatic motion Heart:  S1/S2 SEM AR/MR  murmur, no rub, gallop or click PMI normal Abdomen: benighn, BS positve, no tenderness, no AAA no bruit.  No HSM or HJR Distal pulses intact with no bruits No edema Neuro non-focal Skin warm and dry No muscular weakness   Accessory Clinical Findings     regular sinus  rhythm, 66,  Left axis deviation , left anterior fascicular block ,first-degree AV block, PACs , PVC, poor R progression -no acute ST or T changes.  Assessment & Plan    1.   Essential hypertension:  Hydralazine 50 tid . She otherwise remains on Benicar and amlodipine.   2.  Ascending aortic aneurysm: stable at 5 cm based on CT in November 2016. She is not felt to be a surgical candidate. She is being managed conservatively by Dr. Roxan Hockey.    3. Mitral and aortic valve disease: conservatively managed in the setting of poor surgical candidacy. Echo 11/18/15 moderate MS/mild MR and moderate AR    4. Disposition: patient will follow-up with me in 6 months   She is seeing Dr Alvan Dame for orthopedic issues although not ideal she would not Be a prohibitive candidate for THR/TKR if needed   [employer]

## 2017-04-22 ENCOUNTER — Encounter: Payer: Self-pay | Admitting: Cardiovascular Disease

## 2017-04-22 ENCOUNTER — Ambulatory Visit (INDEPENDENT_AMBULATORY_CARE_PROVIDER_SITE_OTHER): Payer: Medicare Other | Admitting: Cardiovascular Disease

## 2017-04-22 VITALS — BP 114/56 | HR 87 | Ht 60.0 in | Wt 150.1 lb

## 2017-04-22 DIAGNOSIS — I10 Essential (primary) hypertension: Secondary | ICD-10-CM

## 2017-04-22 DIAGNOSIS — I712 Thoracic aortic aneurysm, without rupture: Secondary | ICD-10-CM | POA: Diagnosis not present

## 2017-04-22 DIAGNOSIS — I7121 Aneurysm of the ascending aorta, without rupture: Secondary | ICD-10-CM

## 2017-04-22 NOTE — Patient Instructions (Addendum)

## 2017-05-25 ENCOUNTER — Encounter: Payer: Self-pay | Admitting: Podiatry

## 2017-05-25 NOTE — Progress Notes (Signed)
Patient was requesting diabetic shoes Given neuropathy, history of pre-ulcerative callus and digital deformity I recommend diabetic shoes. Paperwork was completed today for pre-certification.   Celesta Gentile, DPM

## 2017-05-30 ENCOUNTER — Telehealth: Payer: Self-pay | Admitting: Podiatry

## 2017-05-30 DIAGNOSIS — I83009 Varicose veins of unspecified lower extremity with ulcer of unspecified site: Secondary | ICD-10-CM | POA: Diagnosis not present

## 2017-05-30 DIAGNOSIS — N3946 Mixed incontinence: Secondary | ICD-10-CM | POA: Diagnosis not present

## 2017-05-30 DIAGNOSIS — L909 Atrophic disorder of skin, unspecified: Secondary | ICD-10-CM | POA: Diagnosis not present

## 2017-05-30 DIAGNOSIS — I83213 Varicose veins of right lower extremity with both ulcer of ankle and inflammation: Secondary | ICD-10-CM | POA: Diagnosis not present

## 2017-05-30 DIAGNOSIS — E1159 Type 2 diabetes mellitus with other circulatory complications: Secondary | ICD-10-CM | POA: Diagnosis not present

## 2017-05-30 DIAGNOSIS — C3490 Malignant neoplasm of unspecified part of unspecified bronchus or lung: Secondary | ICD-10-CM | POA: Diagnosis not present

## 2017-05-30 NOTE — Telephone Encounter (Signed)
Called pt on 8.3.18 to schedule an appointment for diabetic shoe measurements. Pt asked how much it would cost and I gave her the estimate of 80 to 100 dollars since she has medicare ((20% is pts responsibility) and medicaid. Medicaid does not cover any of the cost for DME. Patient stated it is too expensive and that she did not want to proceed with the diabetic shoes.

## 2017-06-07 ENCOUNTER — Emergency Department (HOSPITAL_COMMUNITY)
Admission: EM | Admit: 2017-06-07 | Discharge: 2017-06-07 | Disposition: A | Discharge status: Home / Self Care | Payer: Medicare Other | Attending: Emergency Medicine | Admitting: Emergency Medicine

## 2017-06-07 ENCOUNTER — Encounter (HOSPITAL_COMMUNITY): Payer: Self-pay | Admitting: Emergency Medicine

## 2017-06-07 DIAGNOSIS — Y929 Unspecified place or not applicable: Secondary | ICD-10-CM | POA: Insufficient documentation

## 2017-06-07 DIAGNOSIS — Y999 Unspecified external cause status: Secondary | ICD-10-CM | POA: Insufficient documentation

## 2017-06-07 DIAGNOSIS — I2510 Atherosclerotic heart disease of native coronary artery without angina pectoris: Secondary | ICD-10-CM | POA: Diagnosis not present

## 2017-06-07 DIAGNOSIS — S0990XA Unspecified injury of head, initial encounter: Secondary | ICD-10-CM

## 2017-06-07 DIAGNOSIS — Z7982 Long term (current) use of aspirin: Secondary | ICD-10-CM | POA: Diagnosis not present

## 2017-06-07 DIAGNOSIS — J449 Chronic obstructive pulmonary disease, unspecified: Secondary | ICD-10-CM | POA: Diagnosis not present

## 2017-06-07 DIAGNOSIS — Y939 Activity, unspecified: Secondary | ICD-10-CM | POA: Diagnosis not present

## 2017-06-07 DIAGNOSIS — I10 Essential (primary) hypertension: Secondary | ICD-10-CM | POA: Diagnosis not present

## 2017-06-07 DIAGNOSIS — E119 Type 2 diabetes mellitus without complications: Secondary | ICD-10-CM | POA: Diagnosis not present

## 2017-06-07 DIAGNOSIS — W19XXXA Unspecified fall, initial encounter: Secondary | ICD-10-CM

## 2017-06-07 DIAGNOSIS — S098XXA Other specified injuries of head, initial encounter: Secondary | ICD-10-CM | POA: Diagnosis not present

## 2017-06-07 DIAGNOSIS — S0080XA Unspecified superficial injury of other part of head, initial encounter: Secondary | ICD-10-CM | POA: Diagnosis not present

## 2017-06-07 DIAGNOSIS — Z79899 Other long term (current) drug therapy: Secondary | ICD-10-CM | POA: Diagnosis not present

## 2017-06-07 DIAGNOSIS — W1830XA Fall on same level, unspecified, initial encounter: Secondary | ICD-10-CM | POA: Insufficient documentation

## 2017-06-07 NOTE — ED Provider Notes (Signed)
[facility] DEPT Provider Note   CSN: [id number] Arrival date & time: 06/07/17  1522     History   Chief Complaint Chief Complaint  Patient presents with  . Fall    HPI [name] is a 83 y.o. female.   Fall  This is a new problem. The current episode started more than 2 days ago. The problem has been resolved. Pertinent negatives include no chest pain, no abdominal pain, no headaches and no shortness of breath. Nothing aggravates the symptoms. Nothing relieves the symptoms. She has tried nothing for the symptoms.    Past Medical History:  Diagnosis Date  . Ascending aortic aneurysm ([place])    a. 08/2015 stable 5 cm Asc Ao Aneurysm.  . Bronchitis   . Carcinoid tumor   . COPD (chronic obstructive pulmonary disease) ([place])   . Coronary artery disease    a. Ca2+ noted on CT - no other clear documentation of CAD.  Marland Kitchen Difficulty in walking(719.7)   . Disturbance of skin sensation   . Edema   . Essential hypertension   . GERD (gastroesophageal reflux disease)   . Hypercholesterolemia   . Hyperlipidemia   . Mitral valve disease    a. 10/2015 Echo: Mod MS, mild MR.  . Moderate aortic insufficiency    a. 10/2015 Echo: EF 50-55%, no rwma, mod AI, mod MS, mild MR, sev dil LA, mild TR/PR, PASP 34mmHg.  . Pain in joint, forearm   . Pain in joint, hand   . Pain in joint, lower leg   . Pulmonary nodules    s. 08/2015 CT: stable lung nodules.  . Type II diabetes mellitus [facility])     Patient Active Problem List   Diagnosis Date Noted  . Lumbar degenerative disc disease 02/21/2017  . Spondylosis without myelopathy or radiculopathy, lumbar region 02/21/2017  . Iron deficiency anemia 10/13/2016  . Venous ulcer of right leg ([place]) 03/16/2016  . Type II diabetes mellitus ([place])   . Ascending aortic aneurysm ([place])   . Primary osteoarthritis of both hands 01/23/2016  . Varicose veins of right lower extremity with complications 27/03/2375  . Open wound of lower leg 08/26/2015  .  Venous ulcer ([place]) 08/26/2015  . Lymphedema of leg 07/23/2015  . Varicose veins of lower extremities with ulcer and inflammation ([place]) 07/23/2015  . Chronic venous insufficiency 07/23/2015  . Leg ulcer ([place]) 07/16/2015  . Ulcer of right lower leg ([place]) 07/12/2015  . Cellulitis of right leg 07/12/2015  . Hyponatremia 07/12/2015  . Chronic diastolic heart failure ([place]) 07/12/2015  . Right hip pain 01/07/2015  . Primary osteoarthritis of right hip 01/07/2015  . Hip contracture 01/07/2015  . AI (aortic incompetence) 08/07/2014  . Diastolic murmur 28/31/5176  . Asthma, mild intermittent 04/04/2013  . Leg varices 03/09/2013  . Asthma, cough variant 10/04/2012  . Eczema intertrigo 05/11/2012  . Basal cell papilloma 05/11/2012  . Carcinoid tumor 10/20/2011  . Allergic rhinitis 07/23/2011  . Airway hyperreactivity 07/23/2011  . Chronic cough 07/23/2011  . Diabetes mellitus type 2 with peripheral artery disease ([place]) 06/19/2009  . HYPERCHOLESTEROLEMIA 06/19/2009  . HYPERLIPIDEMIA 06/19/2009  . Essential hypertension 06/19/2009  . Coronary atherosclerosis 06/19/2009  . Aortic valve regurgitation 06/19/2009  . AORTIC ANEURYSM 06/19/2009  . BRONCHITIS 06/19/2009  . COPD (chronic obstructive pulmonary disease) ([place]) 06/19/2009  . GERD 06/19/2009  . EDEMA 06/19/2009  . 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 bronch with endobronchial  biopies  09/15/2009  . COLONOSCOPY N/A 09/13/2013   Procedure: COLONOSCOPY;  Surgeon: Jeryl Columbia, MD;  Location: [facility] ENDOSCOPY;  Service: Endoscopy;  Laterality: N/A;  . endobronchial excision of right upper lobe tumor with laser bronchi  10/06/2009  . fiberoptic bronch with endobronchial u/s  07/02/2010  . PARS PLANA VITRECTOMY     right eye  . PARS PLANA VITRECTOMY W/ ENDOPHOTOCOAGULATION     right eye  . posterior capsulectomy     right eye  . VIDEO BRONCHOSCOPY  12/09/2009    OB  History    Gravida Para Term Preterm AB Living   2 1 1   1 1    SAB TAB Ectopic Multiple Live Births   1       1       Home Medications    Prior to Admission medications   Medication Sig Start Date End Date Taking? Authorizing Provider  albuterol (PROVENTIL HFA;VENTOLIN HFA) 108 (90 Base) MCG/ACT inhaler Inhale 1-2 puffs into the lungs every 6 (six) hours as needed for wheezing or shortness of breath. 12/02/16   Long, Wonda Olds, MD  amLODipine (NORVASC) 10 MG tablet TAKE 1 TABLET (10 MG TOTAL) BY MOUTH DAILY. 02/14/17   Josue Hector, MD  aspirin EC 81 MG tablet Take 81 mg by mouth every evening. 7 pm    [provider]  atorvastatin (LIPITOR) 10 MG tablet Take 10 mg by mouth at bedtime. 06/17/15   [provider]  FeFum-FePoly-FA-B Cmp-C-Biot (INTEGRA PLUS) CAPS TAKE ONE CAPSULE BY MOUTH EVERY DAY *NOT COVERED* 01/25/17   Curt Bears, MD  furosemide (LASIX) 20 MG tablet TAKE 1 TABLET (20 MG TOTAL) BY MOUTH DAILY AS NEEDED FOR FLUID OR EDEMA. 10/15/16   Josue Hector, MD  glimepiride (AMARYL) 1 MG tablet Take 1 mg by mouth daily.     [provider]  hydrALAZINE (APRESOLINE) 50 MG tablet Take 1 tablet (50 mg total) by mouth 3 (three) times daily. 09/15/16   Josue Hector, MD  montelukast (SINGULAIR) 10 MG tablet Take 10 mg by mouth at bedtime. 06/04/15   [provider]  Multiple Vitamin (MULITIVITAMIN WITH MINERALS) TABS Take 1 tablet by mouth daily.    [provider]  nitroGLYCERIN (NITROSTAT) 0.4 MG SL tablet PLACE 1 TABLET UNDER THE TONGUE EVERY 5 MINUTES AS NEEDED FOR CHEST PAIN 09/14/16   Josue Hector, MD  nystatin cream (MYCOSTATIN) Apply to affected area 2 times daily 03/21/17   Drenda Freeze, MD  olmesartan (BENICAR) 40 MG tablet TAKE 1 TABLET (40 MG TOTAL) BY MOUTH 2 (TWO) TIMES DAILY. 10/20/16   Josue Hector, MD  Omega-3 Fatty Acids (FISH OIL) 1200 MG CAPS Take 1 capsule by mouth daily.    [provider]    omeprazole (PRILOSEC) 40 MG capsule Take 40 mg by mouth daily.    [provider]  pioglitazone (ACTOS) 15 MG tablet Take 7.5 mg by mouth daily.     [provider]  potassium chloride (K-DUR,KLOR-CON) 10 MEQ tablet Take 20 mEq by mouth daily.    [provider]  Probiotic Product (PROBIOTIC PO) Take 1 tablet by mouth daily.    [provider]  Spacer/Aero-Holding Chambers (AEROCHAMBER MINI CHAMBER) DEVI use as directed with your inhalers  04/27/11   [provider]  traMADol (ULTRAM) 50 MG tablet Take 2 tablets (100 mg total) by mouth every 12 (twelve) hours as needed. 02/21/17   Kirsteins, Luanna Salk,  MD  triamcinolone cream (KENALOG) 0.1 % Apply 1 application topically 2 (two) times daily. Patient not taking: Reported on 04/22/2017 03/18/17   Barnet Glasgow, NP    Family History Family History  Problem Relation Age of Onset  . Hypertension Mother   . Hypertension Father   . Heart attack Neg Hx   . Stroke Neg Hx     Social History Social History  Substance Use Topics  . Smoking status: Never Smoker  . Smokeless tobacco: Never Used  . Alcohol use No     Allergies   Patient has no known allergies.   Review of Systems Review of Systems  Respiratory: Negative for shortness of breath.   Cardiovascular: Negative for chest pain.  Gastrointestinal: Negative for abdominal pain.  Neurological: Negative for headaches.  All other systems reviewed and are negative.    Physical Exam Updated Vital Signs BP (!) 158/67 (BP Location: Right Arm)   Pulse 83   Temp (!) 97.5 F (36.4 C) (Oral)   Resp 19   Ht 5' (1.524 m)   Wt 69.4 kg (153 lb)   SpO2 97%   BMI 29.88 kg/m   Physical Exam  Constitutional: She is oriented to person, place, and time. She appears well-developed and well-nourished.  HENT:  Head: Normocephalic and atraumatic.  No ttp, bruising, stepoffs or deformities  Eyes: Pupils are equal, round, and reactive to light.  Conjunctivae and EOM are normal.  Neck: Normal range of motion.  Cardiovascular: Normal rate and regular rhythm.   Pulmonary/Chest: No stridor. No respiratory distress.  Abdominal: Soft. Bowel sounds are normal. She exhibits no distension.  Neurological: She is alert and oriented to person, place, and time. She displays normal reflexes. No cranial nerve deficit. She exhibits normal muscle tone. Coordination normal.  Finger to nose normal. Upper/lower strength normal Normal sensation  Skin: Skin is warm and dry.  Nursing note and vitals reviewed.    ED Treatments / Results  Labs (all labs ordered are listed, but only abnormal results are displayed) Labs Reviewed - No data to display  EKG  EKG Interpretation None       Radiology No results found.  Procedures Procedures (including critical care time)  Medications Ordered in ED Medications - No data to display   Initial Impression / Assessment and Plan / ED Course  I have reviewed the triage vital signs and the nursing notes.  Pertinent labs & imaging results that were available during my care of the patient were reviewed by me and considered in my medical decision making (see chart for details).     Low suspicion for significant head trauma. No red flags. No indication for imaging. Stable for dc.   Final Clinical Impressions(s) / ED Diagnoses   Final diagnoses:  Fall, initial encounter  Injury of head, initial encounter    New Prescriptions New Prescriptions   No medications on file     Kaytlyn Din, Corene Cornea, MD 06/07/17 2215

## 2017-06-07 NOTE — ED Triage Notes (Signed)
Pt here with daughter and sts fall on Friday hitting her head; pt here to be checked; pt sts was mechanical fall; no obvious injuries noted

## 2017-07-06 DIAGNOSIS — I872 Venous insufficiency (chronic) (peripheral): Secondary | ICD-10-CM | POA: Diagnosis not present

## 2017-07-13 DIAGNOSIS — M25551 Pain in right hip: Secondary | ICD-10-CM | POA: Diagnosis not present

## 2017-07-13 DIAGNOSIS — M1611 Unilateral primary osteoarthritis, right hip: Secondary | ICD-10-CM | POA: Diagnosis not present

## 2017-07-20 DIAGNOSIS — R040 Epistaxis: Secondary | ICD-10-CM | POA: Diagnosis not present

## 2017-07-21 DIAGNOSIS — E669 Obesity, unspecified: Secondary | ICD-10-CM | POA: Diagnosis not present

## 2017-07-21 DIAGNOSIS — M25551 Pain in right hip: Secondary | ICD-10-CM | POA: Diagnosis not present

## 2017-07-21 DIAGNOSIS — M2550 Pain in unspecified joint: Secondary | ICD-10-CM | POA: Diagnosis not present

## 2017-07-21 DIAGNOSIS — M150 Primary generalized (osteo)arthritis: Secondary | ICD-10-CM | POA: Diagnosis not present

## 2017-07-21 DIAGNOSIS — Z6830 Body mass index (BMI) 30.0-30.9, adult: Secondary | ICD-10-CM | POA: Diagnosis not present

## 2017-07-22 ENCOUNTER — Other Ambulatory Visit: Payer: Self-pay | Admitting: Orthopedic Surgery

## 2017-07-22 DIAGNOSIS — M25551 Pain in right hip: Secondary | ICD-10-CM

## 2017-07-27 DIAGNOSIS — N3946 Mixed incontinence: Secondary | ICD-10-CM | POA: Diagnosis not present

## 2017-07-27 DIAGNOSIS — N819 Female genital prolapse, unspecified: Secondary | ICD-10-CM | POA: Diagnosis not present

## 2017-07-27 DIAGNOSIS — I350 Nonrheumatic aortic (valve) stenosis: Secondary | ICD-10-CM | POA: Diagnosis not present

## 2017-07-27 DIAGNOSIS — E1159 Type 2 diabetes mellitus with other circulatory complications: Secondary | ICD-10-CM | POA: Diagnosis not present

## 2017-07-27 DIAGNOSIS — N8111 Cystocele, midline: Secondary | ICD-10-CM | POA: Diagnosis not present

## 2017-08-01 DIAGNOSIS — Z23 Encounter for immunization: Secondary | ICD-10-CM | POA: Diagnosis not present

## 2017-08-05 ENCOUNTER — Ambulatory Visit
Admission: RE | Admit: 2017-08-05 | Discharge: 2017-08-05 | Disposition: A | Discharge status: Home / Self Care | Payer: Medicare Other | Source: Ambulatory Visit | Attending: Orthopedic Surgery | Admitting: Orthopedic Surgery

## 2017-08-05 ENCOUNTER — Other Ambulatory Visit: Payer: Self-pay | Admitting: Cardiovascular Disease

## 2017-08-05 DIAGNOSIS — M25551 Pain in right hip: Secondary | ICD-10-CM | POA: Diagnosis not present

## 2017-08-05 MED ORDER — METHYLPREDNISOLONE ACETATE 40 MG/ML INJ SUSP (RADIOLOG
120.0000 mg | Freq: Once | INTRAMUSCULAR | Status: AC
  Administered 2017-08-05: 120 mg via INTRA_ARTICULAR

## 2017-08-05 MED ORDER — IOPAMIDOL (ISOVUE-M 200) INJECTION 41%
1.0000 mL | Freq: Once | INTRAMUSCULAR | Status: AC
  Administered 2017-08-05: 1 mL via INTRA_ARTICULAR

## 2017-08-08 ENCOUNTER — Telehealth: Payer: Self-pay

## 2017-08-08 MED ORDER — TRAMADOL HCL 50 MG PO TABS: 100.0000 mg | ORAL_TABLET | Freq: Two times a day (BID) | ORAL | 0 refills | Status: DC | PRN

## 2017-08-08 NOTE — Telephone Encounter (Signed)
[facility] month supply called to [facility], and the daughter was notified by voice mail. [name] will need an appointment with Dr. Read Drivers to get any further refills.

## 2017-08-08 NOTE — Telephone Encounter (Signed)
Patients daughter Althea Grimmer came to window requesting medication refill tramadol. If there are any questions or concerns please give Renna a call.

## 2017-08-15 ENCOUNTER — Encounter: Payer: Self-pay | Admitting: Physical Medicine & Rehabilitation

## 2017-08-15 ENCOUNTER — Ambulatory Visit (HOSPITAL_BASED_OUTPATIENT_CLINIC_OR_DEPARTMENT_OTHER): Payer: Medicare Other | Admitting: Physical Medicine & Rehabilitation

## 2017-08-15 ENCOUNTER — Encounter: Payer: Medicare Other | Attending: Physical Medicine & Rehabilitation

## 2017-08-15 VITALS — BP 129/75 | HR 89

## 2017-08-15 DIAGNOSIS — M1611 Unilateral primary osteoarthritis, right hip: Secondary | ICD-10-CM | POA: Insufficient documentation

## 2017-08-15 DIAGNOSIS — M24551 Contracture, right hip: Secondary | ICD-10-CM

## 2017-08-15 MED ORDER — TRAMADOL HCL 50 MG PO TABS: 100.0000 mg | ORAL_TABLET | Freq: Three times a day (TID) | ORAL | 5 refills | Status: AC | PRN

## 2017-08-15 NOTE — Progress Notes (Addendum)
Subjective:    Patient ID: [name], female    DOB: 08/29/1932, 83 y.o.   MRN: [id number]  HPI 83 year old female with severe right hip osteoarthritis, reportedly saw Dr. Alvan Dame who because of all her medical problems does not recommend right hip replacement. She gets fair relief with tramadol currently taking 100 mill grams twice a day.  Her daughter is asking if there is any new medicines to help with this.  She is not a good candidate for nonsteroidal anti-inflammatories because of her medical comorbidities. Pain Inventory Average Pain 3 Pain Right Now 3 My pain is .  In the last 24 hours, has pain interfered with the following? General activity 3 Relation with others 3 Enjoyment of life 3 What TIME of day is your pain at its worst? . Sleep (in general) Fair  Pain is worse with: na Pain improves with: na Relief from Meds: 4  Mobility walk without assistance walk with assistance use a cane use a walker ability to climb steps?  no do you drive?  no  Function Do you have any goals in this area?  no  Neuro/Psych No problems in this area  Prior Studies Any changes since last visit?  no  Physicians involved in your care Any changes since last visit?  no   Family History  Problem Relation Age of Onset  . Hypertension Mother   . Hypertension Father   . Heart attack Neg Hx   . Stroke Neg Hx    Social History   Social History  . Marital status: Married    Spouse name: N/A  . Number of children: N/A  . Years of education: N/A   Social History Main Topics  . Smoking status: Never Smoker  . Smokeless tobacco: Never Used  . Alcohol use No  . Drug use: No  . Sexual activity: Not Currently   Other Topics Concern  . Not on file   Social History Narrative  . No narrative on file   Past Surgical History:  Procedure Laterality Date  . bronch with endobronchial biopies  09/15/2009  . COLONOSCOPY N/A 09/13/2013   Procedure: COLONOSCOPY;  Surgeon: Jeryl Columbia, MD;  Location: [facility];  Service: Endoscopy;  Laterality: N/A;  . endobronchial excision of right upper lobe tumor with laser bronchi  10/06/2009  . fiberoptic bronch with endobronchial u/s  07/02/2010  . PARS PLANA VITRECTOMY     right eye  . PARS PLANA VITRECTOMY W/ ENDOPHOTOCOAGULATION     right eye  . posterior capsulectomy     right eye  . VIDEO BRONCHOSCOPY  12/09/2009   Past Medical History:  Diagnosis Date  . Ascending aortic aneurysm ([place])    a. 08/2015 stable 5 cm Asc Ao Aneurysm.  . Bronchitis   . Carcinoid tumor   . COPD (chronic obstructive pulmonary disease) ([place])   . Coronary artery disease    a. Ca2+ noted on CT - no other clear documentation of CAD.  Marland Kitchen Difficulty in walking(719.7)   . Disturbance of skin sensation   . Edema   . Essential hypertension   . GERD (gastroesophageal reflux disease)   . Hypercholesterolemia   . Hyperlipidemia   . Mitral valve disease    a. 10/2015 Echo: Mod MS, mild MR.  . Moderate aortic insufficiency    a. 10/2015 Echo: EF 50-55%, no rwma, mod AI, mod MS, mild MR, sev dil LA, mild TR/PR, PASP 29mmHg.  . Pain in joint, forearm   .  Pain in joint, hand   . Pain in joint, lower leg   . Pulmonary nodules    s. 08/2015 CT: stable lung nodules.  . Type II diabetes mellitus ([place])    There were no vitals taken for this visit.  Opioid Risk Score:   Fall Risk Score:  `1  Depression screen PHQ 2/9  Depression screen [facility] 2/9 07/19/2016 01/23/2016 01/07/2015  Decreased Interest 3 3 0  Down, Depressed, Hopeless 0 0 0  PHQ - 2 Score 3 3 0  Altered sleeping - 3 3  Tired, decreased energy - 1 2  Change in appetite - 0 0  Feeling bad or failure about yourself  - 0 3  Trouble concentrating - 0 0  Moving slowly or fidgety/restless - 0 3  Suicidal thoughts - 0 0  PHQ-9 Score - 7 11  Difficult doing work/chores - Not difficult at all -  Some recent data might be hidden     Review of Systems  Constitutional: Positive for  unexpected weight change.  HENT: Negative.   Eyes: Negative.   Respiratory: Negative.   Cardiovascular: Negative.   Gastrointestinal: Positive for constipation.  Endocrine: Negative.   Genitourinary: Negative.   Musculoskeletal: Negative.   Skin: Negative.   Allergic/Immunologic: Negative.   Neurological: Negative.   Hematological: Negative.   Psychiatric/Behavioral: Negative.   All other systems reviewed and are negative.      Objective:   Physical Exam  Constitutional: She is oriented to person, place, and time. She appears well-developed and well-nourished. No distress.  HENT:  Head: Normocephalic and atraumatic.  Eyes: Pupils are equal, round, and reactive to light. Conjunctivae and EOM are normal.  Neck: Normal range of motion.  Musculoskeletal:  Decreased right hip internal rotation normal external rotation, normal internal/external rotation left hip There is some pain medial aspect of the knee at the palpation No evidence of knee effusion bilaterally.  No pain with knee range of motion  Neurological: She is alert and oriented to person, place, and time.  4/5 right hip flexor 5/5 left hip flexor 5/5 bilateral knee extensors ankle dorsiflexor She ambulates without evidence of toe drag or knee instability but does have decreased hip flexion on the right side she tends to shuffle her right lower extremity, uses walker.  Forward flexed posture.   Skin: She is not diaphoretic.  Psychiatric: She has a normal mood and affect.  Nursing note and vitals reviewed.  Lumbar spine without tenderness to palpation       Assessment & Plan:  #1 1.  Right hip osteoarthritis, severe, we discussed that ideally she would undergo right hip replacement although this is not a good option due to her medical comorbidities. We have tried fluoroscopic guided right hip injection which did not give any significant relief. We have tried medication management including tramadol, he gets fair relief  with the tramadol 100 millIgrams twice daily will increase to 100 Mg 3 times daily, this would be the maximum dose given her age.  Reviewed most recent, comprehensive metabolic package which was normal on 03/21/2017  Physical therapy referral  Return to clinic 6 months

## 2017-08-15 NOTE — Patient Instructions (Signed)
You may try icy hot cream with lidocaine

## 2017-08-24 ENCOUNTER — Other Ambulatory Visit: Payer: Self-pay | Admitting: *Deleted

## 2017-08-24 MED ORDER — AMLODIPINE BESYLATE 10 MG PO TABS: ORAL_TABLET | ORAL | 1 refills | Status: DC

## 2017-08-29 ENCOUNTER — Telehealth: Payer: Self-pay | Admitting: Physical Medicine & Rehabilitation

## 2017-08-29 ENCOUNTER — Other Ambulatory Visit: Payer: Self-pay | Admitting: *Deleted

## 2017-08-29 DIAGNOSIS — M24551 Contracture, right hip: Secondary | ICD-10-CM

## 2017-08-29 DIAGNOSIS — M1611 Unilateral primary osteoarthritis, right hip: Secondary | ICD-10-CM

## 2017-08-29 MED ORDER — OLMESARTAN MEDOXOMIL 40 MG PO TABS: ORAL_TABLET | ORAL | 1 refills | Status: DC

## 2017-08-29 NOTE — Telephone Encounter (Signed)
Order change placed.

## 2017-08-29 NOTE — Telephone Encounter (Signed)
Pt's daughter Rinna phoned and stated she would like her mother's physical therapy transferred to the [facility] location. This location is closest to her house. Please advise.

## 2017-08-29 NOTE — Telephone Encounter (Signed)
That is fine to transfer to [place]

## 2017-08-30 ENCOUNTER — Ambulatory Visit (INDEPENDENT_AMBULATORY_CARE_PROVIDER_SITE_OTHER): Payer: Medicare Other | Admitting: Neurology

## 2017-08-30 ENCOUNTER — Telehealth: Payer: Self-pay | Admitting: *Deleted

## 2017-08-30 ENCOUNTER — Encounter: Payer: Self-pay | Admitting: Neurology

## 2017-08-30 DIAGNOSIS — G250 Essential tremor: Secondary | ICD-10-CM

## 2017-08-30 DIAGNOSIS — C7A090 Malignant carcinoid tumor of the bronchus and lung: Secondary | ICD-10-CM

## 2017-08-30 HISTORY — DX: Essential tremor: G25.0

## 2017-08-30 MED ORDER — INTEGRA PLUS PO CAPS: ORAL_CAPSULE | ORAL | 5 refills | Status: DC

## 2017-08-30 MED ORDER — PRIMIDONE 50 MG PO TABS: 50.0000 mg | ORAL_TABLET | Freq: Every day | ORAL | 3 refills | Status: DC

## 2017-08-30 NOTE — Telephone Encounter (Signed)
Pt called with request of iron refill. Rx sent to pt pharmacy

## 2017-08-30 NOTE — Progress Notes (Signed)
Reason for visit:   Referring physician: Dr. Honor [name] is a 83 y.o. female  History of present illness:  [name] is an 83 year old right-handed [place] female with a history of a tremor that developed about 10 years ago.  Tremor has gradually gotten worse as time has gone by, she has difficulty feeding herself and with handwriting.  She has a brother who also has a similar tremor.  The parents apparently never had a tremor.  The patient also has some difficulty with sleeping at night often times.  The patient is not eating well.  She reports some numbness in the hands, no definite numbness in the feet.  She has some problems with walking mainly because of a severe degenerative process involving the right hip.  The patient uses a walker for ambulation.  She does not report any problems controlling the bowels of the bladder.  The patient comes in with her daughter who helps translate for her.  Past Medical History:  Diagnosis Date  . Ascending aortic aneurysm ([place])    a. 08/2015 stable 5 cm Asc Ao Aneurysm.  . Bronchitis   . Carcinoid tumor   . COPD (chronic obstructive pulmonary disease) ([place])   . Coronary artery disease    a. Ca2+ noted on CT - no other clear documentation of CAD.  Marland Kitchen Difficulty in walking(719.7)   . Disturbance of skin sensation   . Edema   . Essential hypertension   . GERD (gastroesophageal reflux disease)   . Hypercholesterolemia   . Hyperlipidemia   . Mitral valve disease    a. 10/2015 Echo: Mod MS, mild MR.  . Moderate aortic insufficiency    a. 10/2015 Echo: EF 50-55%, no rwma, mod AI, mod MS, mild MR, sev dil LA, mild TR/PR, PASP 72mmHg.  . Pain in joint, forearm   . Pain in joint, hand   . Pain in joint, lower leg   . Pulmonary nodules    s. 08/2015 CT: stable lung nodules.  . Type II diabetes mellitus ([place])     Past Surgical History:  Procedure Laterality Date  . bronch with endobronchial biopies  09/15/2009  . endobronchial  excision of right upper lobe tumor with laser bronchi  10/06/2009  . fiberoptic bronch with endobronchial u/s  07/02/2010  . PARS PLANA VITRECTOMY     right eye  . PARS PLANA VITRECTOMY W/ ENDOPHOTOCOAGULATION     right eye  . posterior capsulectomy     right eye  . VIDEO BRONCHOSCOPY  12/09/2009    Family History  Problem Relation Age of Onset  . Hypertension Mother   . Hypertension Father   . Heart attack Neg Hx   . Stroke Neg Hx     Social history:  reports that  has never smoked. she has never used smokeless tobacco. She reports that she does not drink alcohol or use drugs.  Medications:  Prior to Admission medications   Medication Sig Start Date End Date Taking? Authorizing Provider  albuterol (PROVENTIL HFA;VENTOLIN HFA) 108 (90 Base) MCG/ACT inhaler Inhale 1-2 puffs into lungs every 6 (six) hours as needed for wheezing or shortness of breath. 12/02/16  Yes Long, Wonda Olds, MD  amLODipine (NORVASC) 10 MG tablet TAKE 1 TABLET (10 MG TOTAL) BY MOUTH DAILY. 08/24/17  Yes Josue Hector, MD  aspirin EC 81 MG tablet Take 81 mg by mouth every evening. 7 pm   Yes [provider]  atorvastatin (LIPITOR) 10 MG  tablet Take 10 mg by mouth at bedtime. 06/17/15  Yes [provider]  FeFum-FePoly-FA-B Cmp-C-Biot (INTEGRA PLUS) CAPS TAKE ONE CAPSULE BY MOUTH EVERY DAY *NOT COVERED* 01/25/17  Yes Curt Bears, MD  furosemide (LASIX) 20 MG tablet TAKE 1 TABLET (20 MG TOTAL) BY MOUTH DAILY AS NEEDED FOR FLUID OR EDEMA. 10/15/16  Yes Josue Hector, MD  glimepiride (AMARYL) 1 MG tablet Take 1 mg by mouth daily.    Yes [provider]  hydrALAZINE (APRESOLINE) 50 MG tablet Take 1 tablet (50 mg total) by mouth 3 (three) times daily. 09/15/16  Yes Josue Hector, MD  montelukast (SINGULAIR) 10 MG tablet Take 10 mg by mouth at bedtime. 06/04/15  Yes [provider]  Multiple Vitamin (MULITIVITAMIN WITH MINERALS) TABS Take 1 tablet by mouth daily.   Yes [provider]  nitroGLYCERIN (NITROSTAT) 0.4 MG SL tablet PLACE 1 TABLET UNDER THE TONGUE EVERY 5 MINUTES AS NEEDED FOR CHEST PAIN 09/14/16  Yes Josue Hector, MD  nystatin cream (MYCOSTATIN) Apply to affected area 2 times daily 03/21/17  Yes Drenda Freeze, MD  olmesartan (BENICAR) 40 MG tablet TAKE 1 TABLET (40 MG TOTAL) BY MOUTH 2 (TWO) TIMES DAILY. 08/29/17  Yes Josue Hector, MD  Omega-3 Fatty Acids (FISH OIL) 1200 MG CAPS Take 1 capsule by mouth daily.   Yes [provider]  omeprazole (PRILOSEC) 40 MG capsule Take 40 mg by mouth daily.   Yes [provider]  pioglitazone (ACTOS) 15 MG tablet Take 7.5 mg by mouth daily.    Yes [provider]  potassium chloride (K-DUR,KLOR-CON) 10 MEQ tablet Take 20 mEq by mouth daily.   Yes [provider]  Probiotic Product (PROBIOTIC PO) Take 1 tablet by mouth daily.   Yes [provider]  Spacer/Aero-Holding Chambers (AEROCHAMBER MINI CHAMBER) DEVI use as directed with your inhalers  04/27/11  Yes [provider]  traMADol (ULTRAM) 50 MG tablet Take 2 tablets (100 mg total) by mouth every 8 (eight) hours as needed. 08/15/17  Yes Kirsteins, Luanna Salk, MD  triamcinolone cream (KENALOG) 0.1 % Apply 1 application topically 2 (two) times daily. 03/18/17  Yes Barnet Glasgow, NP  oxybutynin (DITROPAN-XL) 10 MG 24 hr tablet Take 10 mg daily by mouth. 08/19/17   [provider]     No Known Allergies  ROS:  Out of a complete 14 system review of symptoms, the patient complains only of the following symptoms, and all other reviewed systems are negative.  Tremor Hip pain  Blood pressure 137/74, pulse 90, height 5' (1.524 m), weight 153 lb 8 oz (69.6 kg).  Physical Exam  General: The patient is alert and cooperative at the time of the examination.  The patient is moderately obese.  Eyes: Pupils are equal, round, and reactive to light. Discs are flat bilaterally.  Neck: The neck is  supple, no carotid bruits are noted.  Respiratory: The respiratory examination is clear.  Cardiovascular: The cardiovascular examination reveals a regular rate and rhythm, no obvious murmurs or rubs are noted.  Skin: Extremities are with 1+ edema below the knees bilaterally.  Neurologic Exam  Mental status: The patient is alert and oriented x 3 at the time of the examination. The patient has apparent normal recent and remote memory, with an apparently normal attention span and concentration ability.  Cranial nerves: Facial symmetry is present. There is good sensation of the face to pinprick and soft touch bilaterally. The strength of the  facial muscles and the muscles to head turning and shoulder shrug are normal bilaterally. Speech is well enunciated, no aphasia or dysarthria is noted. Extraocular movements are full. Visual fields are full. The tongue is midline, and the patient has symmetric elevation of the soft palate. No obvious hearing deficits are noted.  Motor: The motor testing reveals 5 over 5 strength of all 4 extremities. Good symmetric motor tone is noted throughout.  Sensory: Sensory testing is intact to pinprick, soft touch and vibration sensation on all 4 extremities. No evidence of extinction is noted.  Coordination: Cerebellar testing reveals good finger-nose-finger and heel-to-shin bilaterally.  Intention tremor seen bilaterally with finger-nose-finger.  Gait and station: Gait is associated with a wide-based stance, and a limping quality on the right leg.  Patient usually uses a walker for ambulation.  Tandem gait was not attempted.  Reflexes: Deep tendon reflexes are symmetric and normal bilaterally. Toes are downgoing bilaterally.   Assessment/Plan:  1.  Essential tremor  2.  Gait disorder  The patient has tremors involving both upper extremities.  The patient does have a family history consistent with an essential tremor.  Will she will be placed on low-dose  Mysoline in the evening hours to help her sleep and to improve the tremor.  The patient will follow-up in 3-4 months.  They will call for any dose adjustments.  Jill Alexanders MD 08/30/2017 3:07 PM  [facility] [address] [place], [place] [address]  Phone [phone] Fax [phone]

## 2017-08-30 NOTE — Patient Instructions (Signed)
   We will start Mysoline 50 mg at night for the tremor.

## 2017-08-31 ENCOUNTER — Telehealth: Payer: Self-pay

## 2017-08-31 NOTE — Telephone Encounter (Signed)
**Note De-Identified Candiss Galeana Obfuscation** I did a quantity exception for Olmesartan (Benicar) over the phone with Polly Cobia at [facility]. Approval received for the remainder of 2018 and until 10/24/2018.

## 2017-09-01 ENCOUNTER — Telehealth: Payer: Self-pay | Admitting: Medical Oncology

## 2017-09-01 NOTE — Telephone Encounter (Signed)
Refill done.  

## 2017-09-05 ENCOUNTER — Ambulatory Visit (INDEPENDENT_AMBULATORY_CARE_PROVIDER_SITE_OTHER): Payer: Medicare Other | Admitting: Podiatry

## 2017-09-05 DIAGNOSIS — B351 Tinea unguium: Secondary | ICD-10-CM

## 2017-09-05 DIAGNOSIS — M79674 Pain in right toe(s): Secondary | ICD-10-CM

## 2017-09-05 DIAGNOSIS — M79675 Pain in left toe(s): Secondary | ICD-10-CM | POA: Diagnosis not present

## 2017-09-06 NOTE — Progress Notes (Signed)
Subjective: 83 y.o. returns the office today for painful, elongated, thickened toenails which she cannot trim herself.  Denies any redness or drainage around the nails.  Denies any open wounds. Denies any acute changes since last appointment and no new complaints today. Denies any systemic complaints such as fevers, chills, nausea, vomiting.   Last AM blood sugar: 115  Objective: AAO 3, NAD DP/PT pulses palpable, CRT less than 3 seconds Nails hypertrophic, dystrophic, elongated, brittle, discolored 10. There is tenderness overlying the nails 1-5 bilaterally. There is no surrounding erythema or drainage along the nail sites. No open lesions or pre-ulcerative lesions are identified. No other areas of tenderness bilateral lower extremities. No overlying edema, erythema, increased warmth. No pain with calf compression, swelling, warmth, erythema.  Assessment: Patient presents with symptomatic onychomycosis  Plan: -Treatment options including alternatives, risks, complications were discussed -Nails sharply debrided 10 without complication/bleeding. -Discussed daily foot inspection. If there are any changes, to call the office immediately.  -Follow-up in 3 months or sooner if any problems are to arise. In the meantime, encouraged to call the office with any questions, concerns, changes symptoms.  Celesta Gentile, DPM

## 2017-09-07 DIAGNOSIS — N814 Uterovaginal prolapse, unspecified: Secondary | ICD-10-CM | POA: Diagnosis not present

## 2017-09-07 DIAGNOSIS — N3941 Urge incontinence: Secondary | ICD-10-CM | POA: Diagnosis not present

## 2017-09-19 ENCOUNTER — Encounter: Payer: Self-pay | Admitting: Physical Therapy

## 2017-09-19 ENCOUNTER — Ambulatory Visit: Payer: Medicare Other | Attending: Physical Medicine & Rehabilitation | Admitting: Physical Therapy

## 2017-09-19 DIAGNOSIS — R296 Repeated falls: Secondary | ICD-10-CM | POA: Diagnosis not present

## 2017-09-19 DIAGNOSIS — M6281 Muscle weakness (generalized): Secondary | ICD-10-CM | POA: Insufficient documentation

## 2017-09-19 DIAGNOSIS — M25551 Pain in right hip: Secondary | ICD-10-CM | POA: Diagnosis not present

## 2017-09-19 DIAGNOSIS — R262 Difficulty in walking, not elsewhere classified: Secondary | ICD-10-CM

## 2017-09-19 NOTE — Therapy (Signed)
[place], [place], [address] Phone: [phone]   Fax:  [phone]  Physical Therapy Evaluation  Patient Details  Name: [name] MRN: [id number] Date of Birth: [birth year] Referring Provider: Letta Pate   Encounter Date: 09/19/2017  PT End of Session - 09/19/17 1559    Visit Number  1    Date for PT Re-Evaluation  11/19/17    PT Start Time  1510    PT Stop Time  1600    PT Time Calculation (min)  50 min    Activity Tolerance  Patient tolerated treatment well    Behavior During Therapy  [facility] for tasks assessed/performed       Past Medical History:  Diagnosis Date  . Ascending aortic aneurysm ([place])    a. 08/2015 stable 5 cm Asc Ao Aneurysm.  . Bronchitis   . Carcinoid tumor   . COPD (chronic obstructive pulmonary disease) ([place])   . Coronary artery disease    a. Ca2+ noted on CT - no other clear documentation of CAD.  Marland Kitchen Difficulty in walking(719.7)   . Disturbance of skin sensation   . Edema   . Essential hypertension   . GERD (gastroesophageal reflux disease)   . Hypercholesterolemia   . Hyperlipidemia   . Mitral valve disease    a. 10/2015 Echo: Mod MS, mild MR.  . Moderate aortic insufficiency    a. 10/2015 Echo: EF 50-55%, no rwma, mod AI, mod MS, mild MR, sev dil LA, mild TR/PR, PASP 3mmHg.  . Pain in joint, forearm   . Pain in joint, hand   . Pain in joint, lower leg   . Pulmonary nodules    s. 08/2015 CT: stable lung nodules.  . Tremor, essential 08/30/2017  . Type II diabetes mellitus ([place])     Past Surgical History:  Procedure Laterality Date  . bronch with endobronchial biopies  09/15/2009  . COLONOSCOPY N/A 09/13/2013   Procedure: COLONOSCOPY;  Surgeon: Jeryl Columbia, MD;  Location: [facility] ENDOSCOPY;  Service: Endoscopy;  Laterality: N/A;  . endobronchial excision of right upper lobe tumor with laser bronchi  10/06/2009  . fiberoptic bronch with endobronchial u/s   07/02/2010  . PARS PLANA VITRECTOMY     right eye  . PARS PLANA VITRECTOMY W/ ENDOPHOTOCOAGULATION     right eye  . posterior capsulectomy     right eye  . VIDEO BRONCHOSCOPY  12/09/2009    There were no vitals filed for this visit.   Subjective Assessment - 09/19/17 1520    Subjective  Patient reports that she has had right hip pain for a number of years.  Has OA of the hips, right hip "very bad"    Patient is accompained by:  Family member    Limitations  Walking    Patient Stated Goals  have less pain, walk better    Currently in Pain?  Yes    Pain Score  7     Pain Location  Hip    Pain Orientation  Right    Pain Descriptors / Indicators  Aching;Sore    Pain Type  Chronic pain    Pain Onset  More than a month ago    Pain Frequency  Constant    Aggravating Factors   walking and standing pain up to 9/10    Pain Relieving Factors  rest helps a little, at best pain a 5-6/10    Effect of Pain on  Daily Activities  difficulty walking         [facility] PT Assessment - 09/19/17 0001      Assessment   Medical Diagnosis  right hip apin    Referring Provider  Kirsteins    Onset Date/Surgical Date  08/19/17    Prior Therapy  over a year ago      Precautions   Precautions  None      Balance Screen   Has the patient fallen in the past 6 months  Yes    How many times?  3    Has the patient had a decrease in activity level because of a fear of falling?   Yes    Is the patient reluctant to leave their home because of a fear of falling?   Yes      Home Environment   Additional Comments  single level, lives with husband and daughter, orign[place]all from [place].  Reports has had to stop housework in the past year due to hip issues      Prior Function   Level of Independence  Independent with household mobility with device;Needs assistance with homemaking    Leisure  no exercise      Posture/Postural Control   Posture Comments  fwd head, and roundd shoulder      ROM / Strength    AROM / PROM / Strength  AROM;Strength      AROM   Overall AROM Comments  right hip fleixon in standing 30 degrees, abduction 0, extension 0 degrees      Strength   Overall Strength Comments  in the range strength is 3+/5 with pain      Palpation   Palpation comment  Patient with right hip and lateral thigh tenderness      Transfers   Comments  very difficult for her to get up from sitting      Ambulation/Gait   Gait Comments  very poor slow gait with right hip pain, antalgic on the right with stooped posture, right hip seems to be posterior with a little bit of internal rotation, uses a rolling walker      Standardized Balance Assessment   Standardized Balance Assessment  Timed Up and Go Test      Timed Up and Go Test   Normal TUG (seconds)  45             Objective measurements completed on examination: See above findings.                PT Short Term Goals - 09/19/17 1614      PT SHORT TERM GOAL #1   Title  issue HEP    Time  4    Period  Weeks    Status  New        PT Long Term Goals - 09/19/17 1614      PT LONG TERM GOAL #1   Title  independent with HEP    Time  8    Period  Weeks    Status  New      PT LONG TERM GOAL #2   Title  decrease pain 50%    Time  8    Period  Weeks    Status  New      PT LONG TERM GOAL #3   Title  decrease TUG time to 28 seconds    Time  8    Period  Weeks    Status  New  PT LONG TERM GOAL #4   Title  increase right hip strength to 4/5    Time  8    Period  Weeks    Status  New      PT LONG TERM GOAL #5   Title  increase right hip flexion to 60 degress    Time  8    Period  Weeks    Status  New             Plan - 09/19/17 1602    Clinical Impression Statement  Patient with a long standing right hip issue, OA.  She was seen here about 18 months ago.  She has definitely regressed since that time with having 3 falls in the past 6 months, having more difficulty walking and getting up and  an increase in right hip pain.  TUG time was 45 seconds    Clinical Presentation  Stable    Clinical Decision Making  Low    Rehab Potential  Fair    PT Frequency  1x / week    PT Duration  8 weeks    PT Treatment/Interventions  Electrical Stimulation;Moist Heat;Gait training;Neuromuscular re-education;Balance training;Therapeutic exercise;Therapeutic activities;Functional mobility training;Stair training;Patient/family education;Manual techniques;Passive range of motion    PT Next Visit Plan  slowly add exercises as tolerated, does not like the bike, has an open wound on the right lateral leg    Consulted and Agree with Plan of Care  Patient       Patient will benefit from skilled therapeutic intervention in order to improve the following deficits and impairments:  Abnormal gait, Decreased range of motion, Difficulty walking, Decreased activity tolerance, Pain, Decreased balance, Impaired flexibility, Decreased mobility, Decreased strength, Increased edema  Visit Diagnosis: Pain in right hip - Plan: PT plan of care cert/re-cert  Difficulty in walking, not elsewhere classified - Plan: PT plan of care cert/re-cert  Muscle weakness (generalized) - Plan: PT plan of care cert/re-cert  Repeated falls - Plan: PT plan of care cert/re-cert  G-Codes - 73/71/06 1616    Functional Assessment Tool Used (Outpatient Only)  foto 74% limitation    Functional Limitation  Mobility: Walking and moving around    Mobility: Walking and Moving Around Current Status ([id number])  At least 60 percent but less than 80 percent impaired, limited or restricted    Mobility: Walking and Moving Around Goal Status [phone])  At least 40 percent but less than 60 percent impaired, limited or restricted        Problem List Patient Active Problem List   Diagnosis Date Noted  . Tremor, essential 08/30/2017  . Lumbar degenerative disc disease 02/21/2017  . Spondylosis without myelopathy or radiculopathy, lumbar region  02/21/2017  . Iron deficiency anemia 10/13/2016  . Venous ulcer of right leg ([place]) 03/16/2016  . Type II diabetes mellitus ([place])   . Ascending aortic aneurysm ([place])   . Primary osteoarthritis of both hands 01/23/2016  . Varicose veins of right lower extremity with complications 03/50/0938  . Open wound of lower leg 08/26/2015  . Venous ulcer ([place]) 08/26/2015  . Lymphedema of leg 07/23/2015  . Varicose veins of lower extremities with ulcer and inflammation ([place]) 07/23/2015  . Chronic venous insufficiency 07/23/2015  . Leg ulcer ([place]) 07/16/2015  . Ulcer of right lower leg ([place]) 07/12/2015  . Cellulitis of right leg 07/12/2015  . Hyponatremia 07/12/2015  . Chronic diastolic heart failure ([place]) 07/12/2015  . Right hip pain 01/07/2015  . Primary osteoarthritis of right hip  01/07/2015  . Hip contracture 01/07/2015  . AI (aortic incompetence) 08/07/2014  . Diastolic murmur 25/74/9355  . Asthma, mild intermittent 04/04/2013  . Leg varices 03/09/2013  . Asthma, cough variant 10/04/2012  . Eczema intertrigo 05/11/2012  . Basal cell papilloma 05/11/2012  . Carcinoid tumor 10/20/2011  . Allergic rhinitis 07/23/2011  . Airway hyperreactivity 07/23/2011  . Chronic cough 07/23/2011  . Diabetes mellitus type 2 with peripheral artery disease ([place]) 06/19/2009  . HYPERCHOLESTEROLEMIA 06/19/2009  . HYPERLIPIDEMIA 06/19/2009  . Essential hypertension 06/19/2009  . Coronary atherosclerosis 06/19/2009  . Aortic valve regurgitation 06/19/2009  . AORTIC ANEURYSM 06/19/2009  . BRONCHITIS 06/19/2009  . COPD (chronic obstructive pulmonary disease) ([place]) 06/19/2009  . GERD 06/19/2009  . EDEMA 06/19/2009  . 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

## 2017-09-20 DIAGNOSIS — M1990 Unspecified osteoarthritis, unspecified site: Secondary | ICD-10-CM | POA: Diagnosis not present

## 2017-09-20 DIAGNOSIS — M25561 Pain in right knee: Secondary | ICD-10-CM | POA: Diagnosis not present

## 2017-09-20 DIAGNOSIS — E1159 Type 2 diabetes mellitus with other circulatory complications: Secondary | ICD-10-CM | POA: Diagnosis not present

## 2017-09-20 DIAGNOSIS — M25562 Pain in left knee: Secondary | ICD-10-CM | POA: Diagnosis not present

## 2017-09-20 DIAGNOSIS — F341 Dysthymic disorder: Secondary | ICD-10-CM | POA: Diagnosis not present

## 2017-09-22 ENCOUNTER — Telehealth: Payer: Self-pay | Admitting: Medical Oncology

## 2017-09-22 NOTE — Telephone Encounter (Signed)
Wants appt with Dr Julien Nordmann . Sees Kristen 12/19. Does she need a scan? (Had Ct angio in June).

## 2017-09-22 NOTE — Telephone Encounter (Signed)
Return call from patient.  I received message from Dr.'s nurse about an appointment at 2:00.  What does she mean?  Is the appointment with the practitioner or Dr. Julien Nordmann?"  Future appointment schedule information provided.  Dr. Julien Nordmann returns tomorrow.  Expect return call tomorrow when further information or orders from provider.  Denies further needs or questions.

## 2017-09-23 DIAGNOSIS — E1159 Type 2 diabetes mellitus with other circulatory complications: Secondary | ICD-10-CM | POA: Diagnosis not present

## 2017-09-23 DIAGNOSIS — E7800 Pure hypercholesterolemia, unspecified: Secondary | ICD-10-CM | POA: Diagnosis not present

## 2017-09-23 NOTE — Telephone Encounter (Signed)
Tried to contact pt -vm full.

## 2017-09-23 NOTE — Telephone Encounter (Signed)
Itired to call pt agoan -no answer -She needs to know that she does not need CT scan per [facility].

## 2017-09-23 NOTE — Telephone Encounter (Signed)
Just her annual visit with me or [name]. No scan is needed.

## 2017-09-26 ENCOUNTER — Ambulatory Visit: Payer: Medicare Other | Attending: Physical Medicine & Rehabilitation | Admitting: Physical Therapy

## 2017-09-26 ENCOUNTER — Encounter: Payer: Self-pay | Admitting: Physical Therapy

## 2017-09-26 DIAGNOSIS — M25551 Pain in right hip: Secondary | ICD-10-CM

## 2017-09-26 DIAGNOSIS — M6281 Muscle weakness (generalized): Secondary | ICD-10-CM | POA: Insufficient documentation

## 2017-09-26 DIAGNOSIS — R262 Difficulty in walking, not elsewhere classified: Secondary | ICD-10-CM | POA: Diagnosis not present

## 2017-09-26 DIAGNOSIS — R296 Repeated falls: Secondary | ICD-10-CM | POA: Diagnosis not present

## 2017-09-26 NOTE — Therapy (Signed)
[place], [place], [address] Phone: [phone]   Fax:  [phone]  Physical Therapy Treatment  Patient Details  Name: [name] MRN: [id number] Date of Birth: 1932-03-06 Referring Provider: Letta Pate   Encounter Date: 09/26/2017  PT End of Session - 09/26/17 1523    Visit Number  2    Date for PT Re-Evaluation  11/19/17    PT Start Time  1445    PT Stop Time  1525    PT Time Calculation (min)  40 min    Activity Tolerance  Patient tolerated treatment well    Behavior During Therapy  [facility] for tasks assessed/performed       Past Medical History:  Diagnosis Date  . Ascending aortic aneurysm ([place])    a. 08/2015 stable 5 cm Asc Ao Aneurysm.  . Bronchitis   . Carcinoid tumor   . COPD (chronic obstructive pulmonary disease) ([place])   . Coronary artery disease    a. Ca2+ noted on CT - no other clear documentation of CAD.  Marland Kitchen Difficulty in walking(719.7)   . Disturbance of skin sensation   . Edema   . Essential hypertension   . GERD (gastroesophageal reflux disease)   . Hypercholesterolemia   . Hyperlipidemia   . Mitral valve disease    a. 10/2015 Echo: Mod MS, mild MR.  . Moderate aortic insufficiency    a. 10/2015 Echo: EF 50-55%, no rwma, mod AI, mod MS, mild MR, sev dil LA, mild TR/PR, PASP 40mmHg.  . Pain in joint, forearm   . Pain in joint, hand   . Pain in joint, lower leg   . Pulmonary nodules    s. 08/2015 CT: stable lung nodules.  . Tremor, essential 08/30/2017  . Type II diabetes mellitus ([place])     Past Surgical History:  Procedure Laterality Date  . bronch with endobronchial biopies  09/15/2009  . COLONOSCOPY N/A 09/13/2013   Procedure: COLONOSCOPY;  Surgeon: Jeryl Columbia, MD;  Location: [facility];  Service: Endoscopy;  Laterality: N/A;  . endobronchial excision of right upper lobe tumor with laser bronchi  10/06/2009  . fiberoptic bronch with endobronchial u/s   07/02/2010  . PARS PLANA VITRECTOMY     right eye  . PARS PLANA VITRECTOMY W/ ENDOPHOTOCOAGULATION     right eye  . posterior capsulectomy     right eye  . VIDEO BRONCHOSCOPY  12/09/2009    There were no vitals filed for this visit.  Subjective Assessment - 09/26/17 1452    Subjective  Still very bad with walking    Currently in Pain?  Yes    Pain Score  7     Pain Location  Hip    Pain Orientation  Right                      [facility] Adult PT Treatment/Exercise - 09/26/17 0001      Exercises   Exercises  Knee/Hip      Knee/Hip Exercises: Aerobic   Nustep  level 3 x 10 minutes    Other Aerobic  UBE level 3 x 4 minutes      Knee/Hip Exercises: Machines for Strengthening   Other Machine  chest press 5#, 5# 2 way pulls for scapular retraction      Knee/Hip Exercises: Standing   Heel Raises  10 reps;2 sets    Knee Flexion  2 sets;15 reps  Knee Flexion Limitations  yellow tband    Hip Flexion  2 sets;15 reps    Hip ADduction  2 sets;15 reps    Hip Extension  2 sets;15 reps      Knee/Hip Exercises: Seated   Long Arc Quad  2 sets;15 reps    [employer]  20 reps               PT Short Term Goals - 09/26/17 1526      PT SHORT TERM GOAL #1   Title  issue HEP    Status  On-going        PT Long Term Goals - 09/19/17 1614      PT LONG TERM GOAL #1   Title  independent with HEP    Time  8    Period  Weeks    Status  New      PT LONG TERM GOAL #2   Title  decrease pain 50%    Time  8    Period  Weeks    Status  New      PT LONG TERM GOAL #3   Title  decrease TUG time to 28 seconds    Time  8    Period  Weeks    Status  New      PT LONG TERM GOAL #4   Title  increase right hip strength to 4/5    Time  8    Period  Weeks    Status  New      PT LONG TERM GOAL #5   Title  increase right hip flexion to 60 degress    Time  8    Period  Weeks    Status  New            Plan - 09/26/17 1525    Clinical Impression Statement   Patient continues to have difficulty with walking, right leg stance phase very short. all exercises caused "small amount of pain"    PT Next Visit Plan  slowly add exercises as tolerated, does not like the bike, has an open wound on the right lateral leg    Consulted and Agree with Plan of Care  Patient       Patient will benefit from skilled therapeutic intervention in order to improve the following deficits and impairments:  Abnormal gait, Decreased range of motion, Difficulty walking, Decreased activity tolerance, Pain, Decreased balance, Impaired flexibility, Decreased mobility, Decreased strength, Increased edema  Visit Diagnosis: Pain in right hip  Difficulty in walking, not elsewhere classified  Muscle weakness (generalized)  Repeated falls     Problem List Patient Active Problem List   Diagnosis Date Noted  . Tremor, essential 08/30/2017  . Lumbar degenerative disc disease 02/21/2017  . Spondylosis without myelopathy or radiculopathy, lumbar region 02/21/2017  . Iron deficiency anemia 10/13/2016  . Venous ulcer of right leg ([place]) 03/16/2016  . Type II diabetes mellitus ([place])   . Ascending aortic aneurysm ([place])   . Primary osteoarthritis of both hands 01/23/2016  . Varicose veins of right lower extremity with complications 12/45/8099  . Open wound of lower leg 08/26/2015  . Venous ulcer ([place]) 08/26/2015  . Lymphedema of leg 07/23/2015  . Varicose veins of lower extremities with ulcer and inflammation ([place]) 07/23/2015  . Chronic venous insufficiency 07/23/2015  . Leg ulcer ([place]) 07/16/2015  . Ulcer of right lower leg ([place]) 07/12/2015  . Cellulitis of right leg 07/12/2015  . Hyponatremia 07/12/2015  .  Chronic diastolic heart failure ([place]) 07/12/2015  . Right hip pain 01/07/2015  . Primary osteoarthritis of right hip 01/07/2015  . Hip contracture 01/07/2015  . AI (aortic incompetence) 08/07/2014  . Diastolic murmur 24/46/2863  . Asthma, mild intermittent 04/04/2013   . Leg varices 03/09/2013  . Asthma, cough variant 10/04/2012  . Eczema intertrigo 05/11/2012  . Basal cell papilloma 05/11/2012  . Carcinoid tumor 10/20/2011  . Allergic rhinitis 07/23/2011  . Airway hyperreactivity 07/23/2011  . Chronic cough 07/23/2011  . Diabetes mellitus type 2 with peripheral artery disease ([place]) 06/19/2009  . HYPERCHOLESTEROLEMIA 06/19/2009  . HYPERLIPIDEMIA 06/19/2009  . Essential hypertension 06/19/2009  . Coronary atherosclerosis 06/19/2009  . Aortic valve regurgitation 06/19/2009  . AORTIC ANEURYSM 06/19/2009  . BRONCHITIS 06/19/2009  . COPD (chronic obstructive pulmonary disease) ([place]) 06/19/2009  . GERD 06/19/2009  . EDEMA 06/19/2009  . 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

## 2017-09-27 DIAGNOSIS — I350 Nonrheumatic aortic (valve) stenosis: Secondary | ICD-10-CM | POA: Diagnosis not present

## 2017-09-27 DIAGNOSIS — I10 Essential (primary) hypertension: Secondary | ICD-10-CM | POA: Diagnosis not present

## 2017-09-27 DIAGNOSIS — E1159 Type 2 diabetes mellitus with other circulatory complications: Secondary | ICD-10-CM | POA: Diagnosis not present

## 2017-09-27 DIAGNOSIS — E7800 Pure hypercholesterolemia, unspecified: Secondary | ICD-10-CM | POA: Diagnosis not present

## 2017-09-27 DIAGNOSIS — I2510 Atherosclerotic heart disease of native coronary artery without angina pectoris: Secondary | ICD-10-CM | POA: Diagnosis not present

## 2017-10-03 ENCOUNTER — Ambulatory Visit: Payer: Medicare Other | Admitting: Physical Therapy

## 2017-10-07 ENCOUNTER — Other Ambulatory Visit: Payer: Self-pay | Admitting: *Deleted

## 2017-10-07 DIAGNOSIS — I059 Rheumatic mitral valve disease, unspecified: Secondary | ICD-10-CM

## 2017-10-07 MED ORDER — HYDRALAZINE HCL 50 MG PO TABS: 50.0000 mg | ORAL_TABLET | Freq: Three times a day (TID) | ORAL | 1 refills | Status: DC

## 2017-10-10 ENCOUNTER — Other Ambulatory Visit: Payer: Self-pay | Admitting: Medical Oncology

## 2017-10-10 ENCOUNTER — Ambulatory Visit (HOSPITAL_COMMUNITY)
Admission: EM | Admit: 2017-10-10 | Discharge: 2017-10-10 | Disposition: A | Discharge status: Home / Self Care | Payer: Medicare Other | Attending: Physician Assistant | Admitting: Physician Assistant

## 2017-10-10 ENCOUNTER — Encounter (HOSPITAL_COMMUNITY): Payer: Self-pay | Admitting: *Deleted

## 2017-10-10 ENCOUNTER — Other Ambulatory Visit: Payer: Medicare Other

## 2017-10-10 ENCOUNTER — Other Ambulatory Visit: Payer: Self-pay | Admitting: Internal Medicine

## 2017-10-10 ENCOUNTER — Other Ambulatory Visit (HOSPITAL_BASED_OUTPATIENT_CLINIC_OR_DEPARTMENT_OTHER): Payer: Medicare Other

## 2017-10-10 ENCOUNTER — Telehealth: Payer: Self-pay | Admitting: Internal Medicine

## 2017-10-10 DIAGNOSIS — C7A090 Malignant carcinoid tumor of the bronchus and lung: Secondary | ICD-10-CM

## 2017-10-10 DIAGNOSIS — S60455A Superficial foreign body of left ring finger, initial encounter: Secondary | ICD-10-CM | POA: Diagnosis not present

## 2017-10-10 DIAGNOSIS — D3A00 Benign carcinoid tumor of unspecified site: Secondary | ICD-10-CM | POA: Diagnosis present

## 2017-10-10 DIAGNOSIS — D508 Other iron deficiency anemias: Secondary | ICD-10-CM

## 2017-10-10 DIAGNOSIS — M79645 Pain in left finger(s): Secondary | ICD-10-CM

## 2017-10-10 DIAGNOSIS — S60451A Superficial foreign body of left index finger, initial encounter: Secondary | ICD-10-CM

## 2017-10-10 LAB — CBC WITH DIFFERENTIAL/PLATELET
BASO%: 0.2 % (ref 0.0–2.0)
Basophils Absolute: 0 10*3/uL (ref 0.0–0.1)
EOS ABS: 0.1 10*3/uL (ref 0.0–0.5)
EOS%: 1.3 % (ref 0.0–7.0)
HEMATOCRIT: 36.8 % (ref 34.8–46.6)
HEMOGLOBIN: 11.9 g/dL (ref 11.6–15.9)
LYMPH%: 34.6 % (ref 14.0–49.7)
MCH: 30.9 pg (ref 25.1–34.0)
MCHC: 32.3 g/dL (ref 31.5–36.0)
MCV: 95.6 fL (ref 79.5–101.0)
MONO#: 0.6 10*3/uL (ref 0.1–0.9)
MONO%: 9.4 % (ref 0.0–14.0)
NEUT%: 54.5 % (ref 38.4–76.8)
NEUTROS ABS: 3.4 10*3/uL (ref 1.5–6.5)
PLATELETS: 230 10*3/uL (ref 145–400)
RBC: 3.85 10*6/uL (ref 3.70–5.45)
RDW: 14.3 % (ref 11.2–14.5)
WBC: 6.3 10*3/uL (ref 3.9–10.3)
lymph#: 2.2 10*3/uL (ref 0.9–3.3)

## 2017-10-10 LAB — COMPREHENSIVE METABOLIC PANEL
ALT: 15 U/L (ref 0–55)
ANION GAP: 9 meq/L (ref 3–11)
AST: 20 U/L (ref 5–34)
Albumin: 3.7 g/dL (ref 3.5–5.0)
Alkaline Phosphatase: 93 U/L (ref 40–150)
BUN: 21.3 mg/dL (ref 7.0–26.0)
CALCIUM: 9 mg/dL (ref 8.4–10.4)
CHLORIDE: 103 meq/L (ref 98–109)
CO2: 26 mEq/L (ref 22–29)
Creatinine: 0.9 mg/dL (ref 0.6–1.1)
EGFR: 57 mL/min/{1.73_m2} — ABNORMAL LOW (ref >60)
Glucose: 154 mg/dl — ABNORMAL HIGH (ref 70–140)
POTASSIUM: 4.6 meq/L (ref 3.5–5.1)
Sodium: 138 mEq/L (ref 136–145)
Total Bilirubin: 0.49 mg/dL (ref 0.20–1.20)
Total Protein: 7.1 g/dL (ref 6.4–8.3)

## 2017-10-10 MED ORDER — DOXYCYCLINE HYCLATE 100 MG PO CAPS: 100.0000 mg | ORAL_CAPSULE | Freq: Two times a day (BID) | ORAL | 0 refills | Status: DC

## 2017-10-10 MED ORDER — LIDOCAINE HCL 2 % IJ SOLN
INTRAMUSCULAR | Status: AC
  Filled 2017-10-10: qty 20

## 2017-10-10 MED ORDER — DOXYCYCLINE HYCLATE 100 MG PO TABS
100.0000 mg | ORAL_TABLET | Freq: Once | ORAL | Status: AC
  Administered 2017-10-10: 100 mg via ORAL

## 2017-10-10 MED ORDER — DOXYCYCLINE HYCLATE 100 MG PO TABS
ORAL_TABLET | ORAL | Status: AC
  Filled 2017-10-10: qty 1

## 2017-10-10 NOTE — Discharge Instructions (Signed)
Return if any sign of infection

## 2017-10-10 NOTE — ED Triage Notes (Signed)
Per pt about a week ago she injured her ring finger on her left hand, per pt she may have put a splinter in her finger. Per pt it is swollen

## 2017-10-10 NOTE — ED Provider Notes (Signed)
[place]    CSN: [id number] Arrival date & time: 10/10/17  1603     History   Chief Complaint Chief Complaint  Patient presents with  . Finger Injury    HPI [name] is a 83 y.o. female.   The history is provided by the patient. No language interpreter was used.  Hand Pain  This is a new problem. The current episode started more than 1 week ago. The problem occurs constantly. The problem has been gradually worsening. Nothing aggravates the symptoms. Nothing relieves the symptoms. She has tried nothing for the symptoms. The treatment provided no relief.  Pt reports she cut finger on the bottom of a chair a week ago.  Pt reports she thinks she may still have wood in finger.   Past Medical History:  Diagnosis Date  . Ascending aortic aneurysm ([place])    a. 08/2015 stable 5 cm Asc Ao Aneurysm.  . Bronchitis   . Carcinoid tumor   . COPD (chronic obstructive pulmonary disease) ([place])   . Coronary artery disease    a. Ca2+ noted on CT - no other clear documentation of CAD.  Marland Kitchen Difficulty in walking(719.7)   . Disturbance of skin sensation   . Edema   . Essential hypertension   . GERD (gastroesophageal reflux disease)   . Hypercholesterolemia   . Hyperlipidemia   . Mitral valve disease    a. 10/2015 Echo: Mod MS, mild MR.  . Moderate aortic insufficiency    a. 10/2015 Echo: EF 50-55%, no rwma, mod AI, mod MS, mild MR, sev dil LA, mild TR/PR, PASP 44mmHg.  . Pain in joint, forearm   . Pain in joint, hand   . Pain in joint, lower leg   . Pulmonary nodules    s. 08/2015 CT: stable lung nodules.  . Tremor, essential 08/30/2017  . Type II diabetes mellitus [facility])     Patient Active Problem List   Diagnosis Date Noted  . Tremor, essential 08/30/2017  . Lumbar degenerative disc disease 02/21/2017  . Spondylosis without myelopathy or radiculopathy, lumbar region 02/21/2017  . Iron deficiency anemia 10/13/2016  . Venous ulcer of right leg ([place]) 03/16/2016  .  Type II diabetes mellitus ([place])   . Ascending aortic aneurysm ([place])   . Primary osteoarthritis of both hands 01/23/2016  . Varicose veins of right lower extremity with complications 02/63/7858  . Open wound of lower leg 08/26/2015  . Venous ulcer ([place]) 08/26/2015  . Lymphedema of leg 07/23/2015  . Varicose veins of lower extremities with ulcer and inflammation ([place]) 07/23/2015  . Chronic venous insufficiency 07/23/2015  . Leg ulcer ([place]) 07/16/2015  . Ulcer of right lower leg ([place]) 07/12/2015  . Cellulitis of right leg 07/12/2015  . Hyponatremia 07/12/2015  . Chronic diastolic heart failure ([place]) 07/12/2015  . Right hip pain 01/07/2015  . Primary osteoarthritis of right hip 01/07/2015  . Hip contracture 01/07/2015  . AI (aortic incompetence) 08/07/2014  . Diastolic murmur 85/11/7739  . Asthma, mild intermittent 04/04/2013  . Leg varices 03/09/2013  . Asthma, cough variant 10/04/2012  . Eczema intertrigo 05/11/2012  . Basal cell papilloma 05/11/2012  . Carcinoid tumor 10/20/2011  . Allergic rhinitis 07/23/2011  . Airway hyperreactivity 07/23/2011  . Chronic cough 07/23/2011  . Diabetes mellitus type 2 with peripheral artery disease ([place]) 06/19/2009  . HYPERCHOLESTEROLEMIA 06/19/2009  . HYPERLIPIDEMIA 06/19/2009  . Essential hypertension 06/19/2009  . Coronary atherosclerosis 06/19/2009  . Aortic valve regurgitation 06/19/2009  . AORTIC  ANEURYSM 06/19/2009  . BRONCHITIS 06/19/2009  . COPD (chronic obstructive pulmonary disease) ([employer]) 06/19/2009  . GERD 06/19/2009  . EDEMA 06/19/2009  . 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 bronch with endobronchial biopies  09/15/2009  . COLONOSCOPY N/A 09/13/2013   Procedure: COLONOSCOPY;  Surgeon: Jeryl Columbia, MD;  [facility];  Service: Endoscopy;  Laterality: N/A;  . endobronchial excision of right upper lobe tumor with laser bronchi  10/06/2009  . fiberoptic  bronch with endobronchial u/s  07/02/2010  . PARS PLANA VITRECTOMY     right eye  . PARS PLANA VITRECTOMY W/ ENDOPHOTOCOAGULATION     right eye  . posterior capsulectomy     right eye  . VIDEO BRONCHOSCOPY  12/09/2009    OB History    Gravida Para Term Preterm AB Living   2 1 1   1 1    SAB TAB Ectopic Multiple Live Births   1       1       Home Medications    Prior to Admission medications   Medication Sig Start Date End Date Taking? Authorizing Provider  amLODipine (NORVASC) 10 MG tablet TAKE 1 TABLET (10 MG TOTAL) BY MOUTH DAILY. 08/24/17  Yes Josue Hector, MD  aspirin EC 81 MG tablet Take 81 mg by mouth every evening. 7 pm   Yes [provider]  atorvastatin (LIPITOR) 10 MG tablet Take 10 mg by mouth at bedtime. 06/17/15  Yes [provider]  furosemide (LASIX) 20 MG tablet TAKE 1 TABLET (20 MG TOTAL) BY MOUTH DAILY AS NEEDED FOR FLUID OR EDEMA. 10/15/16  Yes Josue Hector, MD  glimepiride (AMARYL) 1 MG tablet Take 1 mg by mouth daily.    Yes [provider]  hydrALAZINE (APRESOLINE) 50 MG tablet Take 1 tablet (50 mg total) by mouth 3 (three) times daily. 10/07/17  Yes Josue Hector, MD  montelukast (SINGULAIR) 10 MG tablet Take 10 mg by mouth at bedtime. 06/04/15  Yes [provider]  Multiple Vitamin (MULITIVITAMIN WITH MINERALS) TABS Take 1 tablet by mouth daily.   Yes [provider]  nystatin cream (MYCOSTATIN) Apply to affected area 2 times daily 03/21/17  Yes Drenda Freeze, MD  olmesartan (BENICAR) 40 MG tablet TAKE 1 TABLET (40 MG TOTAL) BY MOUTH 2 (TWO) TIMES DAILY. 08/29/17  Yes Josue Hector, MD  Omega-3 Fatty Acids (FISH OIL) 1200 MG CAPS Take 1 capsule by mouth daily.   Yes [provider]  omeprazole (PRILOSEC) 40 MG capsule Take 40 mg by mouth daily.   Yes [provider]  pioglitazone (ACTOS) 15 MG tablet Take 7.5 mg by mouth daily.    Yes [provider]  potassium chloride  (K-DUR,KLOR-CON) 10 MEQ tablet Take 20 mEq by mouth daily.   Yes [provider]  primidone (MYSOLINE) 50 MG tablet Take 1 tablet (50 mg total) at bedtime by mouth. 08/30/17  Yes Kathrynn Ducking, MD  Probiotic Product (PROBIOTIC PO) Take 1 tablet by mouth daily.   Yes [provider]  traMADol (ULTRAM) 50 MG tablet Take 2 tablets (100 mg total) by mouth every 8 (eight) hours as needed. 08/15/17  Yes Kirsteins, Luanna Salk, MD  triamcinolone cream (KENALOG) 0.1 % Apply 1 application topically 2 (two) times daily. 03/18/17  Yes Barnet Glasgow, NP  albuterol (PROVENTIL HFA;VENTOLIN HFA) 108 (90 Base) MCG/ACT inhaler Inhale 1-2 puffs into the lungs  every 6 (six) hours as needed for wheezing or shortness of breath. 12/02/16   Long, Wonda Olds, MD  doxycycline (VIBRAMYCIN) 100 MG capsule Take 1 capsule (100 mg total) by mouth 2 (two) times daily. 10/10/17   Fransico Meadow, PA-C  FeFum-FePoly-FA-B Cmp-C-Biot (INTEGRA PLUS) CAPS TAKE ONE CAPSULE BY MOUTH EVERY DAY *NOT COVERED* 08/30/17   Curt Bears, MD  nitroGLYCERIN (NITROSTAT) 0.4 MG SL tablet PLACE 1 TABLET UNDER THE TONGUE EVERY 5 MINUTES AS NEEDED FOR CHEST PAIN 09/14/16   Josue Hector, MD  oxybutynin (DITROPAN-XL) 10 MG 24 hr tablet Take 10 mg daily by mouth. 08/19/17   [provider]  Spacer/Aero-Holding Chambers (AEROCHAMBER MINI CHAMBER) DEVI use as directed with your inhalers  04/27/11   [provider]    Family History Family History  Problem Relation Age of Onset  . Hypertension Mother   . Hypertension Father   . Heart attack Neg Hx   . Stroke Neg Hx     Social History Social History   Tobacco Use  . Smoking status: Never Smoker  . Smokeless tobacco: Never Used  Substance Use Topics  . Alcohol use: No  . Drug use: No     Allergies   Patient has no known allergies.   Review of Systems Review of Systems  All other systems reviewed and are negative.    Physical Exam Triage Vital  Signs ED Triage Vitals  Enc Vitals Group     BP 10/10/17 1624 102/61     Pulse Rate 10/10/17 1624 89     Resp --      Temp 10/10/17 1624 99 F (37.2 C)     Temp Source 10/10/17 1624 Oral     SpO2 10/10/17 1624 95 %     Weight --      Height --      Head Circumference --      Peak Flow --      Pain Score 10/10/17 1748 7     Pain Loc --      Pain Edu? --      Excl. in [place]? --    No data found.  Updated Vital Signs BP 102/61 (BP Location: Right Arm)   Pulse 89   Temp 99 F (37.2 C) (Oral)   SpO2 95%   Visual Acuity Right Eye Distance:   Left Eye Distance:   Bilateral Distance:    Right Eye Near:   Left Eye Near:    Bilateral Near:     Physical Exam  Constitutional: She appears well-developed and well-nourished.  Musculoskeletal:  49mm splinter left ring finger.   Neurological: She is alert.  Psychiatric: She has a normal mood and affect.  Nursing note and vitals reviewed.    UC Treatments / Results  Labs (all labs ordered are listed, but only abnormal results are displayed) Labs Reviewed - No data to display  EKG  EKG Interpretation None       Radiology No results found.  Procedures Foreign Body Removal Date/Time: 10/10/2017 8:38 PM Performed by: Fransico Meadow, PA-C Authorized by: Fransico Meadow, PA-C   Consent:    Consent obtained:  Verbal   Consent given by:  Patient Location:    Depth:  Subcutaneous   Tendon involvement:  None Procedure type:    Procedure complexity:  Simple Procedure details:    Localization method:  Finder needle   Foreign bodies recovered:  1   Intact foreign body removal: yes  Post-procedure details:    Neurovascular status: intact     Confirmation:  No additional foreign bodies on visualization   Skin closure:  None   Patient tolerance of procedure:  Tolerated well, no immediate complications   (including critical care time)  Medications Ordered in UC Medications  doxycycline (VIBRA-TABS) tablet 100 mg  (100 mg Oral Given 10/10/17 1738)     Initial Impression / Assessment and Plan / UC Course  I have reviewed the triage vital signs and the nursing notes.  Pertinent labs & imaging results that were available during my care of the patient were reviewed by me and considered in my medical decision making (see chart for details).    Pt counseled on foreign body.  Pt advised to soak finger 20 minutes 4 times a day for the next 2 days.  Final Clinical Impressions(s) / UC Diagnoses   Final diagnoses:  Foreign body of left index finger    ED Discharge Orders        Ordered    doxycycline (VIBRAMYCIN) 100 MG capsule  2 times daily     10/10/17 1733       Controlled Substance Prescriptions [place] Controlled Substance Registry consulted? No   Fransico Meadow, [place] 10/10/17 2039

## 2017-10-10 NOTE — Telephone Encounter (Signed)
Daughter stopped by after lab and moved 12/19 f/u from [facility] to [facility]. Per dtr she wants mom to see [facility] only. Date per availability and dtr's schedule.

## 2017-10-12 ENCOUNTER — Ambulatory Visit: Payer: Medicare Other | Admitting: Oncology

## 2017-10-22 ENCOUNTER — Other Ambulatory Visit: Payer: Self-pay | Admitting: Neurology

## 2017-11-02 ENCOUNTER — Encounter: Payer: Self-pay | Admitting: Physical Therapy

## 2017-11-02 ENCOUNTER — Ambulatory Visit: Payer: Medicare Other | Attending: Physical Medicine & Rehabilitation | Admitting: Physical Therapy

## 2017-11-02 DIAGNOSIS — R262 Difficulty in walking, not elsewhere classified: Secondary | ICD-10-CM | POA: Insufficient documentation

## 2017-11-02 DIAGNOSIS — M6281 Muscle weakness (generalized): Secondary | ICD-10-CM | POA: Diagnosis not present

## 2017-11-02 DIAGNOSIS — R296 Repeated falls: Secondary | ICD-10-CM | POA: Insufficient documentation

## 2017-11-02 DIAGNOSIS — M25551 Pain in right hip: Secondary | ICD-10-CM | POA: Insufficient documentation

## 2017-11-02 NOTE — Therapy (Signed)
[place], [place], [address] Phone: [phone]   Fax:  [phone]  Physical Therapy Treatment  Patient Details  Name: [name] MRN: [id number] Date of Birth: 01-Aug-1932 Referring Provider: Letta Pate   Encounter Date: 11/02/2017  PT End of Session - 11/02/17 1540    Visit Number  3    Date for PT Re-Evaluation  11/19/17    PT Start Time  1455    PT Stop Time  1540    PT Time Calculation (min)  45 min    Activity Tolerance  Patient tolerated treatment well    Behavior During Therapy  [facility] for tasks assessed/performed       Past Medical History:  Diagnosis Date  . Ascending aortic aneurysm ([place])    a. 08/2015 stable 5 cm Asc Ao Aneurysm.  . Bronchitis   . Carcinoid tumor   . COPD (chronic obstructive pulmonary disease) ([place])   . Coronary artery disease    a. Ca2+ noted on CT - no other clear documentation of CAD.  Marland Kitchen Difficulty in walking(719.7)   . Disturbance of skin sensation   . Edema   . Essential hypertension   . GERD (gastroesophageal reflux disease)   . Hypercholesterolemia   . Hyperlipidemia   . Mitral valve disease    a. 10/2015 Echo: Mod MS, mild MR.  . Moderate aortic insufficiency    a. 10/2015 Echo: EF 50-55%, no rwma, mod AI, mod MS, mild MR, sev dil LA, mild TR/PR, PASP 32mmHg.  . Pain in joint, forearm   . Pain in joint, hand   . Pain in joint, lower leg   . Pulmonary nodules    s. 08/2015 CT: stable lung nodules.  . Tremor, essential 08/30/2017  . Type II diabetes mellitus ([place])     Past Surgical History:  Procedure Laterality Date  . bronch with endobronchial biopies  09/15/2009  . COLONOSCOPY N/A 09/13/2013   Procedure: COLONOSCOPY;  Surgeon: Jeryl Columbia, MD;  Location: [facility];  Service: Endoscopy;  Laterality: N/A;  . endobronchial excision of right upper lobe tumor with laser bronchi  10/06/2009  . fiberoptic bronch with endobronchial u/s   07/02/2010  . PARS PLANA VITRECTOMY     right eye  . PARS PLANA VITRECTOMY W/ ENDOPHOTOCOAGULATION     right eye  . posterior capsulectomy     right eye  . VIDEO BRONCHOSCOPY  12/09/2009    There were no vitals filed for this visit.  Subjective Assessment - 11/02/17 1455    Subjective  Patient has not been in to see [place] in over a month due to snow, and the holidays, her daughter reports that they will see the arthritis MD next week    Currently in Pain?  Yes    Pain Score  7     Pain Location  Hip    Pain Orientation  Right    Pain Descriptors / Indicators  Aching;Sore    Aggravating Factors   walking and standing pain to 9/10                      [facility] Adult PT Treatment/Exercise - 11/02/17 0001      Knee/Hip Exercises: Stretches   Gastroc Stretch  3 reps;20 seconds      Knee/Hip Exercises: Aerobic   Nustep  level 3 x 10 minutes    Other Aerobic  UBE level 3 x 4 minutes  Knee/Hip Exercises: Machines for Strengthening   Other Machine  chest press 5#, 5# 2 way pulls for scapular retraction      Knee/Hip Exercises: Seated   Long Arc Quad  2 sets;15 reps 2#    [facility]  20 reps    Clamshell with Marga Hoots    Other Seated Knee/Hip Exercises  weighted ball lift and weighted ball trunk rotaiton    Other Seated Knee/Hip Exercises  toe taps and heel raises    Marching  3 sets;10 reps    Marching Weights  2 lbs.    Hamstring Curl  3 sets;10 reps    Hamstring Limitations  green tband             PT Education - 11/02/17 1543    Education provided  Yes    Education Details  gave HEP for her to do LAQ's and marching when a commercial comes on TV    Person(s) Educated  Patient;Child(ren)    Methods  Explanation;Demonstration    Comprehension  Verbalized understanding       PT Short Term Goals - 11/02/17 1543      PT SHORT TERM GOAL #1   Title  issue HEP    Status  Achieved        PT Long Term Goals - 09/19/17 1614      PT LONG  TERM GOAL #1   Title  independent with HEP    Time  8    Period  Weeks    Status  New      PT LONG TERM GOAL #2   Title  decrease pain 50%    Time  8    Period  Weeks    Status  New      PT LONG TERM GOAL #3   Title  decrease TUG time to 28 seconds    Time  8    Period  Weeks    Status  New      PT LONG TERM GOAL #4   Title  increase right hip strength to 4/5    Time  8    Period  Weeks    Status  New      PT LONG TERM GOAL #5   Title  increase right hip flexion to 60 degress    Time  8    Period  Weeks    Status  New            Plan - 11/02/17 1541    Clinical Impression Statement  the right leg really is limiting her ability to walk, she keeps it straight, kind of drags it behind her, and has significant antalgia, I palpate as she walks and do not appreciate much of any crepitus or popping, they report to me that she will be seeing a new MD next week    PT Next Visit Plan  slowly add exercises as tolerated, does not like the bike, has an open wound on the right lateral leg, could try some manual hip distraction    Consulted and Agree with Plan of Care  Patient       Patient will benefit from skilled therapeutic intervention in order to improve the following deficits and impairments:  Abnormal gait, Decreased range of motion, Difficulty walking, Decreased activity tolerance, Pain, Decreased balance, Impaired flexibility, Decreased mobility, Decreased strength, Increased edema  Visit Diagnosis: Pain in right hip  Difficulty in walking, not elsewhere classified  Muscle weakness (generalized)  Repeated falls     Problem List Patient Active Problem List   Diagnosis Date Noted  . Tremor, essential 08/30/2017  . Lumbar degenerative disc disease 02/21/2017  . Spondylosis without myelopathy or radiculopathy, lumbar region 02/21/2017  . Iron deficiency anemia 10/13/2016  . Venous ulcer of right leg ([place]) 03/16/2016  . Type II diabetes mellitus ([place])   .  Ascending aortic aneurysm ([place])   . Primary osteoarthritis of both hands 01/23/2016  . Varicose veins of right lower extremity with complications 08/65/7846  . Open wound of lower leg 08/26/2015  . Venous ulcer ([place]) 08/26/2015  . Lymphedema of leg 07/23/2015  . Varicose veins of lower extremities with ulcer and inflammation ([place]) 07/23/2015  . Chronic venous insufficiency 07/23/2015  . Leg ulcer ([place]) 07/16/2015  . Ulcer of right lower leg ([place]) 07/12/2015  . Cellulitis of right leg 07/12/2015  . Hyponatremia 07/12/2015  . Chronic diastolic heart failure ([place]) 07/12/2015  . Right hip pain 01/07/2015  . Primary osteoarthritis of right hip 01/07/2015  . Hip contracture 01/07/2015  . AI (aortic incompetence) 08/07/2014  . Diastolic murmur 96/29/5284  . Asthma, mild intermittent 04/04/2013  . Leg varices 03/09/2013  . Asthma, cough variant 10/04/2012  . Eczema intertrigo 05/11/2012  . Basal cell papilloma 05/11/2012  . Carcinoid tumor 10/20/2011  . Allergic rhinitis 07/23/2011  . Airway hyperreactivity 07/23/2011  . Chronic cough 07/23/2011  . Diabetes mellitus type 2 with peripheral artery disease ([place]) 06/19/2009  . HYPERCHOLESTEROLEMIA 06/19/2009  . HYPERLIPIDEMIA 06/19/2009  . Essential hypertension 06/19/2009  . Coronary atherosclerosis 06/19/2009  . Aortic valve regurgitation 06/19/2009  . AORTIC ANEURYSM 06/19/2009  . BRONCHITIS 06/19/2009  . COPD (chronic obstructive pulmonary disease) ([place]) 06/19/2009  . GERD 06/19/2009  . EDEMA 06/19/2009  . 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, [place], [address] Phone: [phone]   Fax:  [phone]  Name: [name] MRN: [id number] Date of Birth: 05/28/1932

## 2017-11-07 DIAGNOSIS — E119 Type 2 diabetes mellitus without complications: Secondary | ICD-10-CM | POA: Diagnosis not present

## 2017-11-07 DIAGNOSIS — M069 Rheumatoid arthritis, unspecified: Secondary | ICD-10-CM | POA: Diagnosis not present

## 2017-11-07 DIAGNOSIS — Z85118 Personal history of other malignant neoplasm of bronchus and lung: Secondary | ICD-10-CM | POA: Diagnosis not present

## 2017-11-07 DIAGNOSIS — M1990 Unspecified osteoarthritis, unspecified site: Secondary | ICD-10-CM | POA: Diagnosis not present

## 2017-11-07 DIAGNOSIS — M064 Inflammatory polyarthropathy: Secondary | ICD-10-CM | POA: Diagnosis not present

## 2017-11-07 DIAGNOSIS — M79643 Pain in unspecified hand: Secondary | ICD-10-CM | POA: Diagnosis not present

## 2017-11-09 ENCOUNTER — Encounter: Payer: Self-pay | Admitting: Physical Therapy

## 2017-11-09 ENCOUNTER — Ambulatory Visit: Payer: Medicare Other | Admitting: Physical Therapy

## 2017-11-09 DIAGNOSIS — M6281 Muscle weakness (generalized): Secondary | ICD-10-CM

## 2017-11-09 DIAGNOSIS — R262 Difficulty in walking, not elsewhere classified: Secondary | ICD-10-CM | POA: Diagnosis not present

## 2017-11-09 DIAGNOSIS — R296 Repeated falls: Secondary | ICD-10-CM

## 2017-11-09 DIAGNOSIS — M25551 Pain in right hip: Secondary | ICD-10-CM

## 2017-11-09 NOTE — Therapy (Signed)
[place], [place], [address] Phone: [phone]   Fax:  [phone]  Physical Therapy Treatment  Patient Details  Name: [name] MRN: [id number] Date of Birth: [birth year] Referring Provider: Letta Pate   Encounter Date: 11/09/2017  PT End of Session - 11/09/17 1615    Visit Number  4    Date for PT Re-Evaluation  11/19/17    PT Start Time  1500    PT Stop Time  1549    PT Time Calculation (min)  49 min    Activity Tolerance  Patient tolerated treatment well    Behavior During Therapy  [facility] for tasks assessed/performed       Past Medical History:  Diagnosis Date  . Ascending aortic aneurysm ([place])    a. 08/2015 stable 5 cm Asc Ao Aneurysm.  . Bronchitis   . Carcinoid tumor   . COPD (chronic obstructive pulmonary disease) ([place])   . Coronary artery disease    a. Ca2+ noted on CT - no other clear documentation of CAD.  Marland Kitchen Difficulty in walking(719.7)   . Disturbance of skin sensation   . Edema   . Essential hypertension   . GERD (gastroesophageal reflux disease)   . Hypercholesterolemia   . Hyperlipidemia   . Mitral valve disease    a. 10/2015 Echo: Mod MS, mild MR.  . Moderate aortic insufficiency    a. 10/2015 Echo: EF 50-55%, no rwma, mod AI, mod MS, mild MR, sev dil LA, mild TR/PR, PASP 26mmHg.  . Pain in joint, forearm   . Pain in joint, hand   . Pain in joint, lower leg   . Pulmonary nodules    s. 08/2015 CT: stable lung nodules.  . Tremor, essential 08/30/2017  . Type II diabetes mellitus ([place])     Past Surgical History:  Procedure Laterality Date  . bronch with endobronchial biopies  09/15/2009  . COLONOSCOPY N/A 09/13/2013   Procedure: COLONOSCOPY;  Surgeon: Jeryl Columbia, MD;  [facility];  Service: Endoscopy;  Laterality: N/A;  . endobronchial excision of right upper lobe tumor with laser bronchi  10/06/2009  . fiberoptic bronch with endobronchial u/s   07/02/2010  . PARS PLANA VITRECTOMY     right eye  . PARS PLANA VITRECTOMY W/ ENDOPHOTOCOAGULATION     right eye  . posterior capsulectomy     right eye  . VIDEO BRONCHOSCOPY  12/09/2009    There were no vitals filed for this visit.  Subjective Assessment - 11/09/17 1612    Subjective  Patient with a bad cough today, and a little short of breath    Currently in Pain?  Yes    Pain Score  6     Pain Location  Hip    Pain Orientation  Right    Pain Descriptors / Indicators  Aching;Sore                      [facility] Adult PT Treatment/Exercise - 11/09/17 0001      Knee/Hip Exercises: Stretches   Gastroc Stretch  3 reps;20 seconds      Knee/Hip Exercises: Aerobic   Nustep  level 4 x 10 minutes, required multiple rest breaks    Other Aerobic  UBE level 3 x 4 minutes      Knee/Hip Exercises: Standing   Heel Raises  10 reps;2 sets    Knee Flexion  2 sets;15 reps  Knee Flexion Limitations  red tband    Hip Flexion  2 sets;15 reps    Hip ADduction  2 sets;15 reps    Hip Extension  2 sets;15 reps      Knee/Hip Exercises: Seated   Long Arc Quad  2 sets;15 reps;Weights    Long Arc Quad Weight  2 lbs.    Ball Squeeze  20 reps    Clamshell with [employer]    Other Seated Knee/Hip Exercises  weighted ball lift and weighted ball trunk rotaiton    Other Seated Knee/Hip Exercises  toe taps and heel raises    Marching  3 sets;10 reps    Marching Weights  2 lbs.               PT Short Term Goals - 11/02/17 1543      PT SHORT TERM GOAL #1   Title  issue HEP    Status  Achieved        PT Long Term Goals - 11/09/17 1617      PT LONG TERM GOAL #1   Title  independent with HEP    Status  On-going      PT LONG TERM GOAL #2   Title  decrease pain 50%    Status  On-going            Plan - 11/09/17 1615    Clinical Impression Statement  Patient had a lot of coughing, she was more short of breath today, asked the daughter if she has seen the  MD.  Tolerated the exercises but needed rests    PT Next Visit Plan  slowly add exercises as tolerated, does not like the bike, has an open wound on the right lateral leg, could try some manual hip distraction    Consulted and Agree with Plan of Care  Patient       Patient will benefit from skilled therapeutic intervention in order to improve the following deficits and impairments:  Abnormal gait, Decreased range of motion, Difficulty walking, Decreased activity tolerance, Pain, Decreased balance, Impaired flexibility, Decreased mobility, Decreased strength, Increased edema  Visit Diagnosis: Pain in right hip  Difficulty in walking, not elsewhere classified  Muscle weakness (generalized)  Repeated falls     Problem List Patient Active Problem List   Diagnosis Date Noted  . Tremor, essential 08/30/2017  . Lumbar degenerative disc disease 02/21/2017  . Spondylosis without myelopathy or radiculopathy, lumbar region 02/21/2017  . Iron deficiency anemia 10/13/2016  . Venous ulcer of right leg ([place]) 03/16/2016  . Type II diabetes mellitus ([place])   . Ascending aortic aneurysm ([place])   . Primary osteoarthritis of both hands 01/23/2016  . Varicose veins of right lower extremity with complications 62/83/1517  . Open wound of lower leg 08/26/2015  . Venous ulcer ([place]) 08/26/2015  . Lymphedema of leg 07/23/2015  . Varicose veins of lower extremities with ulcer and inflammation ([place]) 07/23/2015  . Chronic venous insufficiency 07/23/2015  . Leg ulcer ([place]) 07/16/2015  . Ulcer of right lower leg ([place]) 07/12/2015  . Cellulitis of right leg 07/12/2015  . Hyponatremia 07/12/2015  . Chronic diastolic heart failure ([place]) 07/12/2015  . Right hip pain 01/07/2015  . Primary osteoarthritis of right hip 01/07/2015  . Hip contracture 01/07/2015  . AI (aortic incompetence) 08/07/2014  . Diastolic murmur 61/60/7371  . Asthma, mild intermittent 04/04/2013  . Leg varices 03/09/2013  . Asthma, cough  variant 10/04/2012  . Eczema intertrigo 05/11/2012  .  Basal cell papilloma 05/11/2012  . Carcinoid tumor 10/20/2011  . Allergic rhinitis 07/23/2011  . Airway hyperreactivity 07/23/2011  . Chronic cough 07/23/2011  . Diabetes mellitus type 2 with peripheral artery disease ([place]) 06/19/2009  . HYPERCHOLESTEROLEMIA 06/19/2009  . HYPERLIPIDEMIA 06/19/2009  . Essential hypertension 06/19/2009  . Coronary atherosclerosis 06/19/2009  . Aortic valve regurgitation 06/19/2009  . AORTIC ANEURYSM 06/19/2009  . BRONCHITIS 06/19/2009  . COPD (chronic obstructive pulmonary disease) ([place]) 06/19/2009  . GERD 06/19/2009  . EDEMA 06/19/2009  . 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

## 2017-11-14 ENCOUNTER — Telehealth: Payer: Self-pay | Admitting: Internal Medicine

## 2017-11-14 ENCOUNTER — Ambulatory Visit: Payer: Medicare Other | Admitting: Internal Medicine

## 2017-11-14 NOTE — Telephone Encounter (Signed)
Patient called to reschedule  °

## 2017-11-16 ENCOUNTER — Ambulatory Visit: Payer: Medicare Other | Admitting: Physical Therapy

## 2017-11-17 ENCOUNTER — Other Ambulatory Visit: Payer: Self-pay | Admitting: Cardiovascular Disease

## 2017-11-18 ENCOUNTER — Other Ambulatory Visit: Payer: Self-pay | Admitting: Cardiovascular Disease

## 2017-11-18 MED ORDER — OLMESARTAN MEDOXOMIL 40 MG PO TABS: ORAL_TABLET | ORAL | 1 refills | Status: DC

## 2017-12-05 ENCOUNTER — Telehealth: Payer: Self-pay | Admitting: Cardiovascular Disease

## 2017-12-05 NOTE — Telephone Encounter (Signed)
New Message   rinna called in for patient and had hard to understand , her bp is higher than normal   Pt c/o BP issue: STAT if pt c/o blurred vision, one-sided weakness or slurred speech  1. What are your last 5 BP readings? 181/72 64   After meds its 165/63 76   2. Are you having any other symptoms (ex. Dizziness, headache, blurred vision, passed out)? Just nausea   3. What is your BP issue?  Not feeling well

## 2017-12-05 NOTE — Telephone Encounter (Signed)
Returned call to patient's daughter and gave her several appointment options all of which she declined as they were not good for her.  I have asked that she contact her PCP if she needed her mother seen sooner than what I have offered.  She is very difficult to understand over the phone and I have requested an interpreter for her HTN clinic visit.

## 2017-12-05 NOTE — Telephone Encounter (Signed)
Patient's daughter Zhara Gieske [facility]) calling wanting to get an appointment to see Dr. Johnsie Cancel for BP. Per Dr. Johnsie Cancel, patient needs to see PCP or they can go to the BP clinic.  Patient's daughter is refusing to go to BP clinic and states she has already been to PCP. Patient's daughter is hard to understand and follow what she is saying.  Renna ask to speak with supervisor will forward to clinical supervisor Claiborne Billings.

## 2017-12-07 DIAGNOSIS — M13851 Other specified arthritis, right hip: Secondary | ICD-10-CM | POA: Diagnosis not present

## 2017-12-07 DIAGNOSIS — M25551 Pain in right hip: Secondary | ICD-10-CM | POA: Diagnosis not present

## 2017-12-14 ENCOUNTER — Telehealth: Payer: Self-pay | Admitting: Neurology

## 2017-12-14 NOTE — Telephone Encounter (Signed)
Called, LVM for them to call. They should have refills at [facility]. Rx sent 10/26/17 with 3 refills.  Please let them know if they call back

## 2017-12-14 NOTE — Telephone Encounter (Signed)
Pts daughter called requesting a refill for primidone (MYSOLINE) 50 MG tablet sent to [facility]

## 2017-12-16 ENCOUNTER — Ambulatory Visit: Payer: Medicare Other | Admitting: Physician Assistant

## 2017-12-19 ENCOUNTER — Other Ambulatory Visit: Payer: Self-pay | Admitting: *Deleted

## 2017-12-19 MED ORDER — PRIMIDONE 50 MG PO TABS: 50.0000 mg | ORAL_TABLET | Freq: Every day | ORAL | 0 refills | Status: DC

## 2017-12-20 ENCOUNTER — Other Ambulatory Visit: Payer: Self-pay | Admitting: Medical Oncology

## 2017-12-20 ENCOUNTER — Telehealth: Payer: Self-pay | Admitting: Internal Medicine

## 2017-12-20 ENCOUNTER — Inpatient Hospital Stay: Payer: Medicare Other | Attending: Internal Medicine | Admitting: Internal Medicine

## 2017-12-20 ENCOUNTER — Other Ambulatory Visit: Payer: Self-pay | Admitting: Orthopedic Surgery

## 2017-12-20 ENCOUNTER — Ambulatory Visit (HOSPITAL_COMMUNITY)
Admission: RE | Admit: 2017-12-20 | Discharge: 2017-12-20 | Disposition: A | Discharge status: Home / Self Care | Payer: Medicare Other | Source: Ambulatory Visit | Attending: Internal Medicine | Admitting: Internal Medicine

## 2017-12-20 ENCOUNTER — Encounter: Payer: Self-pay | Admitting: Internal Medicine

## 2017-12-20 VITALS — BP 148/47 | HR 78 | Temp 97.6°F | Resp 16 | Ht 60.0 in | Wt 163.4 lb

## 2017-12-20 DIAGNOSIS — C7A090 Malignant carcinoid tumor of the bronchus and lung: Secondary | ICD-10-CM

## 2017-12-20 DIAGNOSIS — I10 Essential (primary) hypertension: Secondary | ICD-10-CM

## 2017-12-20 DIAGNOSIS — E119 Type 2 diabetes mellitus without complications: Secondary | ICD-10-CM | POA: Diagnosis not present

## 2017-12-20 DIAGNOSIS — K219 Gastro-esophageal reflux disease without esophagitis: Secondary | ICD-10-CM | POA: Diagnosis not present

## 2017-12-20 DIAGNOSIS — J449 Chronic obstructive pulmonary disease, unspecified: Secondary | ICD-10-CM | POA: Diagnosis not present

## 2017-12-20 DIAGNOSIS — Z79899 Other long term (current) drug therapy: Secondary | ICD-10-CM | POA: Diagnosis not present

## 2017-12-20 DIAGNOSIS — Z7984 Long term (current) use of oral hypoglycemic drugs: Secondary | ICD-10-CM

## 2017-12-20 DIAGNOSIS — I2510 Atherosclerotic heart disease of native coronary artery without angina pectoris: Secondary | ICD-10-CM

## 2017-12-20 DIAGNOSIS — M13851 Other specified arthritis, right hip: Secondary | ICD-10-CM

## 2017-12-20 DIAGNOSIS — E7800 Pure hypercholesterolemia, unspecified: Secondary | ICD-10-CM

## 2017-12-20 NOTE — Telephone Encounter (Signed)
Appointment scheduled [facility]/[place] printed per 2/26 los

## 2017-12-20 NOTE — Progress Notes (Signed)
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  PRIOR THERAPY: 1. Status post resection of multiple nodules from the left lung under the care of Dr Arlyce Dice on July 15, 2005. 2. Status post repeat bronchoscopy with endobronchial excision of the right upper lobe tumor and laser bronchoscopy under the care of Dr Arlyce Dice on October 08, 2009.  CURRENT THERAPY: Observation.  INTERVAL HISTORY: [name] 84 y.o. female returns to the clinic today for follow-up visit accompanied by her daughter and a [place] interpreter.  The patient and her daughter did not want the [place] interpreter to be in the room at the time of the visit but she stayed there for a few minutes.  She is feeling fine today with no specific complaints.  She denied having any recent chest pain but continues to have shortness of breath and mild cough with no hemoptysis.  She denied having any fatigue or weakness she denied having any recent weight loss or night sweats.  She had CT angiogram of the chest in June 2018 that showed stable disease.  She was supposed to have repeat chest x-ray before the visit in December 2018 but the patient missed her appointment.  She came today for reevaluation and discussion of her lab results.   MEDICAL HISTORY: Past Medical History:  Diagnosis Date  . Ascending aortic aneurysm ([place])    a. 08/2015 stable 5 cm Asc Ao Aneurysm.  . Bronchitis   . Carcinoid tumor   . COPD (chronic obstructive pulmonary disease) ([place])   . Coronary artery disease    a. Ca2+ noted on CT - no other clear documentation of CAD.  Marland Kitchen Difficulty in walking(719.7)   . Disturbance of skin sensation   . Edema   . Essential hypertension   . GERD (gastroesophageal reflux disease)   . Hypercholesterolemia   . Hyperlipidemia   .  Mitral valve disease    a. 10/2015 Echo: Mod MS, mild MR.  . Moderate aortic insufficiency    a. 10/2015 Echo: EF 50-55%, no rwma, mod AI, mod MS, mild MR, sev dil LA, mild TR/PR, PASP 50mmHg.  . Pain in joint, forearm   . Pain in joint, hand   . Pain in joint, lower leg   . Pulmonary nodules    s. 08/2015 CT: stable lung nodules.  . Tremor, essential 08/30/2017  . Type II diabetes mellitus ([facility])     ALLERGIES:  has No Known Allergies.  MEDICATIONS:  Current Outpatient Medications  Medication Sig Dispense Refill  . albuterol (PROVENTIL HFA;VENTOLIN HFA) 108 (90 Base) MCG/ACT inhaler Inhale 1-2 puffs into the lungs every 6 (six) hours as needed for wheezing or shortness of breath. 1 Inhaler 0  . amLODipine (NORVASC) 10 MG tablet TAKE 1 TABLET (10 MG TOTAL) BY MOUTH DAILY. 90 tablet 1  . aspirin EC 81 MG tablet Take 81 mg by mouth every evening. 7 pm    . atorvastatin (LIPITOR) 10 MG tablet Take 10 mg by mouth at bedtime.  12  . FeFum-FePoly-FA-B Cmp-C-Biot (INTEGRA PLUS) CAPS TAKE ONE CAPSULE BY MOUTH EVERY DAY *NOT COVERED* 30 capsule 5  . furosemide (LASIX) 20 MG tablet TAKE 1 TABLET (20 MG TOTAL) BY MOUTH DAILY AS NEEDED FOR FLUID OR EDEMA. 30 tablet 3  . glimepiride (AMARYL) 1 MG tablet Take 1  mg by mouth daily.     . hydrALAZINE (APRESOLINE) 50 MG tablet Take 1 tablet (50 mg total) by mouth 3 (three) times daily. 270 tablet 1  . montelukast (SINGULAIR) 10 MG tablet Take 10 mg by mouth at bedtime.  1  . Multiple Vitamin (MULITIVITAMIN WITH MINERALS) TABS Take 1 tablet by mouth daily.    . nitroGLYCERIN (NITROSTAT) 0.4 MG SL tablet PLACE 1 TABLET UNDER THE TONGUE EVERY 5 MINUTES AS NEEDED FOR CHEST PAIN 25 tablet 2  . nystatin cream (MYCOSTATIN) Apply to affected area 2 times daily 60 g 2  . olmesartan (BENICAR) 40 MG tablet TAKE 1 TABLET (40 MG TOTAL) BY MOUTH 2 (TWO) TIMES DAILY. 180 tablet 1  . Omega-3 Fatty Acids (FISH OIL) 1200 MG CAPS Take 1 capsule by mouth daily.    Marland Kitchen  omeprazole (PRILOSEC) 40 MG capsule Take 40 mg by mouth daily.    Marland Kitchen oxybutynin (DITROPAN-XL) 10 MG 24 hr tablet Take 10 mg daily by mouth.  1  . pioglitazone (ACTOS) 15 MG tablet Take 7.5 mg by mouth daily.     . potassium chloride (K-DUR,KLOR-CON) 10 MEQ tablet Take 20 mEq by mouth daily.    . primidone (MYSOLINE) 50 MG tablet Take 1 tablet (50 mg total) by mouth at bedtime. 90 tablet 0  . Probiotic Product (PROBIOTIC PO) Take 1 tablet by mouth daily.    Marland Kitchen Spacer/Aero-Holding Chambers (AEROCHAMBER MINI CHAMBER) DEVI use as directed with your inhalers     . traMADol (ULTRAM) 50 MG tablet Take 2 tablets (100 mg total) by mouth every 8 (eight) hours as needed. 180 tablet 5  . triamcinolone cream (KENALOG) 0.1 % Apply 1 application topically 2 (two) times daily. 30 g 0   No current facility-administered medications for this visit.     SURGICAL HISTORY:  Past Surgical History:  Procedure Laterality Date  . bronch with endobronchial biopies  09/15/2009  . COLONOSCOPY N/A 09/13/2013   Procedure: COLONOSCOPY;  Surgeon: Jeryl Columbia, MD;  Location: [facility];  Service: Endoscopy;  Laterality: N/A;  . endobronchial excision of right upper lobe tumor with laser bronchi  10/06/2009  . fiberoptic bronch with endobronchial u/s  07/02/2010  . PARS PLANA VITRECTOMY     right eye  . PARS PLANA VITRECTOMY W/ ENDOPHOTOCOAGULATION     right eye  . posterior capsulectomy     right eye  . VIDEO BRONCHOSCOPY  12/09/2009    REVIEW OF SYSTEMS:  A comprehensive review of systems was negative except for: Respiratory: positive for cough and dyspnea on exertion   PHYSICAL EXAMINATION: General appearance: alert, cooperative and no distress Head: Normocephalic, without obvious abnormality, atraumatic Neck: no adenopathy, no JVD, supple, symmetrical, trachea midline and thyroid not enlarged, symmetric, no tenderness/mass/nodules Lymph nodes: Cervical, supraclavicular, and axillary nodes normal. Resp: clear  to auscultation bilaterally Back: symmetric, no curvature. ROM normal. No CVA tenderness. Cardio: regular rate and rhythm, S1, S2 normal, no murmur, click, rub or gallop GI: soft, non-tender; bowel sounds normal; no masses,  no organomegaly Extremities: extremities normal, atraumatic, no cyanosis or edema  ECOG PERFORMANCE STATUS: 1 - Symptomatic but completely ambulatory  Blood pressure (!) 148/47, pulse 78, temperature 97.6 F (36.4 C), temperature source Oral, resp. rate 16, height 5' (1.524 m), weight 163 lb 6.4 oz (74.1 kg), SpO2 95 %.  LABORATORY DATA: Lab Results  Component Value Date   WBC 6.3 10/10/2017   HGB 11.9 10/10/2017   HCT 36.8 10/10/2017   MCV  95.6 10/10/2017   PLT 230 10/10/2017      Chemistry      Component Value Date/Time   NA 138 10/10/2017 1405   K 4.6 10/10/2017 1405   CL 102 03/21/2017 1927   CL 104 07/07/2012 1435   CO2 26 10/10/2017 1405   BUN 21.3 10/10/2017 1405   CREATININE 0.9 10/10/2017 1405      Component Value Date/Time   CALCIUM 9.0 10/10/2017 1405   ALKPHOS 93 10/10/2017 1405   AST 20 10/10/2017 1405   ALT 15 10/10/2017 1405   BILITOT 0.49 10/10/2017 1405       RADIOGRAPHIC STUDIES: No results found.  ASSESSMENT AND PLAN: This is a very pleasant 84 years old white female with history of carcinoid tumor of the lung status post resection in 2006.  Her recent CT scan of the chest in June 2018 showed no concerning findings for disease progression.  I did not see a need for the patient to have repeat chest x-ray today. I recommended for her to continue on observation with repeat chest x-ray in December 2019.  She will also have blood work performed at that time. The patient was advised to call immediately if she has any concerning symptoms in the interval. The patient voices understanding of current disease status and treatment options and is in agreement with the current care plan. All questions were answered. The patient knows to call  the clinic with any problems, questions or concerns. We can certainly see the patient much sooner if necessary.  I spent 10 minutes counseling the patient face to face. The total time spent in the appointment was 15 minutes.  Disclaimer: This note was dictated with voice recognition software. Similar sounding words can inadvertently be transcribed and may not be corrected upon review.

## 2017-12-20 NOTE — Progress Notes (Signed)
I cannot understand pt or daughter to complete med reconciliation and collaborative intake.  I brought a translator in the room and they did not want a Optometrist. The daughters voice became very loud and she was waving her hands "No ,NO". Dr Julien Nordmann in to see pt.

## 2017-12-23 ENCOUNTER — Ambulatory Visit: Payer: Medicare Other | Admitting: Cardiovascular Disease

## 2017-12-26 ENCOUNTER — Ambulatory Visit (INDEPENDENT_AMBULATORY_CARE_PROVIDER_SITE_OTHER): Payer: Medicare Other | Admitting: Pharmacist

## 2017-12-26 VITALS — BP 134/72 | HR 64

## 2017-12-26 DIAGNOSIS — I10 Essential (primary) hypertension: Secondary | ICD-10-CM

## 2017-12-26 NOTE — Progress Notes (Signed)
Patient ID: [name]                 DOB: 1932-04-24                      MRN: [id number]     HPI: [name] is a 84 y.o. female referred by Dr. Johnsie Cancel to HTN clinic. PMH is significant for DM2, HTN, CAD, HFpEF, and AAA. Patient's daughter called clinic 3 weeks ago with pt complaints of nausea and elevated BP readings 181/72 before meds and 165/63 after meds. No medication changes were made and pt presents today for follow up.  Patient presents today with her daughter. An interpreter was present, however patient's daughter stated she did not want her present for the visit. Daughter speaks [place] and pt is non-English speaking. Today's visit communicated with patient's daughter. She brings in all of patient's medications which match up with our medication list. She uses a home [employer] BP cuff and most readings are at goal. Readings: 147/60, 157/60 132/59, 140/61, 148/61, 132/68. Low SBP of 119, high of 156. When BP is elevated > 401 systolic, pt has been taking an extra 25mg  of hydralazine. She denies dizziness, blurred vision, or headache. She walks with a walker due to hip pain and balance problems. Home BP cuff measures accurately compared to clinic reading today.  Current HTN meds: amlodipine 10mg  daily, hydralazine 50mg  TID, olmesartan 40mg  BID, furosemide 20mg  daily prn  BP goal: <140/26mmHg due to age  Family History: Mother and father with hx of HTN.  Social History: Denies tobacco, alcohol, and illicit drug use.  Diet: Does not add salt to food. Does not drink caffeine.   Exercise: Limited due to age.  Home BP readings: 147/60, 157/60 132/59, 140/61, 148/61, 132/68, low SBP 119, high SBP 156   Wt Readings from Last 3 Encounters:  12/20/17 163 lb 6.4 oz (74.1 kg)  08/30/17 153 lb 8 oz (69.6 kg)  06/07/17 153 lb (69.4 kg)   BP Readings from Last 3 Encounters:  12/20/17 (!) 148/47  10/10/17 102/61  08/30/17 137/74   Pulse Readings from Last 3 Encounters:  12/20/17 78    10/10/17 89  08/30/17 90    Renal function: CrCl cannot be calculated (Patient's most recent lab result is older than the maximum 21 days allowed.).  Past Medical History:  Diagnosis Date  . Ascending aortic aneurysm ([place])    a. 08/2015 stable 5 cm Asc Ao Aneurysm.  . Bronchitis   . Carcinoid tumor   . COPD (chronic obstructive pulmonary disease) ([place])   . Coronary artery disease    a. Ca2+ noted on CT - no other clear documentation of CAD.  Marland Kitchen Difficulty in walking(719.7)   . Disturbance of skin sensation   . Edema   . Essential hypertension   . GERD (gastroesophageal reflux disease)   . Hypercholesterolemia   . Hyperlipidemia   . Mitral valve disease    a. 10/2015 Echo: Mod MS, mild MR.  . Moderate aortic insufficiency    a. 10/2015 Echo: EF 50-55%, no rwma, mod AI, mod MS, mild MR, sev dil LA, mild TR/PR, PASP 17mmHg.  . Pain in joint, forearm   . Pain in joint, hand   . Pain in joint, lower leg   . Pulmonary nodules    s. 08/2015 CT: stable lung nodules.  . Tremor, essential 08/30/2017  . Type II diabetes mellitus ([place])     Current Outpatient Medications on  File Prior to Visit  Medication Sig Dispense Refill  . albuterol (PROVENTIL HFA;VENTOLIN HFA) 108 (90 Base) MCG/ACT inhaler Inhale 1-2 puffs into the lungs every 6 (six) hours as needed for wheezing or shortness of breath. 1 Inhaler 0  . amLODipine (NORVASC) 10 MG tablet TAKE 1 TABLET (10 MG TOTAL) BY MOUTH DAILY. 90 tablet 1  . aspirin EC 81 MG tablet Take 81 mg by mouth every evening. 7 pm    . atorvastatin (LIPITOR) 10 MG tablet Take 10 mg by mouth at bedtime.  12  . FeFum-FePoly-FA-B Cmp-C-Biot (INTEGRA PLUS) CAPS TAKE ONE CAPSULE BY MOUTH EVERY DAY *NOT COVERED* 30 capsule 5  . furosemide (LASIX) 20 MG tablet TAKE 1 TABLET (20 MG TOTAL) BY MOUTH DAILY AS NEEDED FOR FLUID OR EDEMA. 30 tablet 3  . glimepiride (AMARYL) 1 MG tablet Take 1 mg by mouth daily.     . hydrALAZINE (APRESOLINE) 50 MG tablet Take 1 tablet  (50 mg total) by mouth 3 (three) times daily. 270 tablet 1  . montelukast (SINGULAIR) 10 MG tablet Take 10 mg by mouth at bedtime.  1  . Multiple Vitamin (MULITIVITAMIN WITH MINERALS) TABS Take 1 tablet by mouth daily.    . nitroGLYCERIN (NITROSTAT) 0.4 MG SL tablet PLACE 1 TABLET UNDER THE TONGUE EVERY 5 MINUTES AS NEEDED FOR CHEST PAIN 25 tablet 2  . nystatin cream (MYCOSTATIN) Apply to affected area 2 times daily 60 g 2  . olmesartan (BENICAR) 40 MG tablet TAKE 1 TABLET (40 MG TOTAL) BY MOUTH 2 (TWO) TIMES DAILY. 180 tablet 1  . Omega-3 Fatty Acids (FISH OIL) 1200 MG CAPS Take 1 capsule by mouth daily.    Marland Kitchen omeprazole (PRILOSEC) 40 MG capsule Take 40 mg by mouth daily.    Marland Kitchen oxybutynin (DITROPAN-XL) 10 MG 24 hr tablet Take 10 mg daily by mouth.  1  . pioglitazone (ACTOS) 15 MG tablet Take 7.5 mg by mouth daily.     . potassium chloride (K-DUR,KLOR-CON) 10 MEQ tablet Take 20 mEq by mouth daily.    . primidone (MYSOLINE) 50 MG tablet Take 1 tablet (50 mg total) by mouth at bedtime. 90 tablet 0  . Probiotic Product (PROBIOTIC PO) Take 1 tablet by mouth daily.    Marland Kitchen Spacer/Aero-Holding Chambers (AEROCHAMBER MINI CHAMBER) DEVI use as directed with your inhalers     . traMADol (ULTRAM) 50 MG tablet Take 2 tablets (100 mg total) by mouth every 8 (eight) hours as needed. 180 tablet 5  . triamcinolone cream (KENALOG) 0.1 % Apply 1 application topically 2 (two) times daily. 30 g 0   No current facility-administered medications on file prior to visit.     No Known Allergies   Assessment/Plan:  1. Hypertension - BP is at goal <140/43mmHg. Home readings are mostly at goal with some systolic readings into the 150s. Patient's daughter is concerned about occasional high BP readings in the 606-301 systolic range. After reviewing home BP readings, she has not had any readings this high in the past few week and most readings have been 601-093A systolic. Will continue amlodipine 10mg  daily, hydralazine 50mg   TID, olmesartan 40mg  BID, and furosemide 20mg  daily prn. If SBP is 160-170, she will take an additional hydralazine 25mg . If SBP is 180-190, she will take an additional hydralazine 50mg  tablet. F/u in HTN clinic as needed.   Essance Gatti E. Rukaya Kleinschmidt, PharmD, CPP, [facility] [address] [place], [place] [address] Phone: [phone]; Fax: [phone]  12/26/2017 3:38 PM

## 2017-12-26 NOTE — Patient Instructions (Addendum)
It was nice to meet you today  Continue taking your medications, your blood pressure is at goal < 140/90  If blood pressure at home is 329-191 systolic, take an extra hydralazine 25mg  tablet  If blood pressure at home is 660-600 systolic, take an extra hydralazine 50mg  tablet  Follow up as needed

## 2017-12-27 ENCOUNTER — Telehealth: Payer: Self-pay | Admitting: Cardiovascular Disease

## 2017-12-27 NOTE — Telephone Encounter (Signed)
New message     Pt came in for an appt on 12/26/17. Shortly after patient arrived, interpreter signed in. Once the patient realized that an interpreter was here she came to the front desk stating that she had called and said they did not need or want the interpreter to come why was she here for this appt. Pt was given Release of Responsibility for interpretation form and patient signed. Patient information was updated to suggest they do not need an interpreter for visits.

## 2017-12-28 ENCOUNTER — Encounter (HOSPITAL_COMMUNITY): Payer: Self-pay | Admitting: Psychiatry

## 2017-12-28 ENCOUNTER — Ambulatory Visit (INDEPENDENT_AMBULATORY_CARE_PROVIDER_SITE_OTHER): Payer: Medicare Other | Admitting: Psychiatry

## 2017-12-28 VITALS — BP 140/84 | HR 78 | Ht 60.0 in | Wt 158.0 lb

## 2017-12-28 DIAGNOSIS — R450 Nervousness: Secondary | ICD-10-CM | POA: Diagnosis not present

## 2017-12-28 DIAGNOSIS — M549 Dorsalgia, unspecified: Secondary | ICD-10-CM

## 2017-12-28 DIAGNOSIS — F330 Major depressive disorder, recurrent, mild: Secondary | ICD-10-CM | POA: Diagnosis not present

## 2017-12-28 DIAGNOSIS — F419 Anxiety disorder, unspecified: Secondary | ICD-10-CM

## 2017-12-28 DIAGNOSIS — Z560 Unemployment, unspecified: Secondary | ICD-10-CM

## 2017-12-28 DIAGNOSIS — G4700 Insomnia, unspecified: Secondary | ICD-10-CM

## 2017-12-28 DIAGNOSIS — M2550 Pain in unspecified joint: Secondary | ICD-10-CM

## 2017-12-28 DIAGNOSIS — R251 Tremor, unspecified: Secondary | ICD-10-CM

## 2017-12-28 MED ORDER — DULOXETINE HCL 20 MG PO CPEP: 20.0000 mg | ORAL_CAPSULE | Freq: Every day | ORAL | 0 refills | Status: DC

## 2017-12-28 NOTE — Progress Notes (Signed)
Psychiatric Initial Adult Assessment   Patient Identification: [name] MRN:  [id number] Date of Evaluation:  12/28/2017 Referral Source: Primary care physician. Chief Complaint:  I need help.  I have no energy.  Visit Diagnosis:    ICD-10-CM   1. Mild episode of recurrent major depressive disorder (HCC) F33.0 DULoxetine (CYMBALTA) 20 MG capsule    History of Present Illness: Patient is a 84 year old 89, married, unemployed female who came with her daughter for the initial appointment.  Patient and her daughter do not want any translator and prefer to daughter help translating.  Her daughter speaks broken [place].  Patient does not speak English at all.  As per daughter patient has been suffering from depression for past few years.  Daughter noticed that patient has no motivation to do things.  She feel isolated, withdrawn, lost interest in her daily activities.  Patient has multiple health issues including chronic pain, arthritis, difficulty walking, diabetes mellitus, hypertension, aortic aneurysm, varicose vein, gait disturbance, tremors and chronic fatigue.  Patient's daughter believe due to a lot of health issues she had lost interest in her daily activities.  Her husband is a patient in this office and her husband is doing much better on psychiatric medication.  Patient and her daughter wants to try antidepressant to see if she can get improve energy and able to do things.  Patient denies any crying spells, paranoia, irritability, anger, mania, psychosis, hallucination or any suicidal thoughts.  Her major concern is chronic pain and lack of motivation to do things.  Patient uses walker for ambulation.  Patient does not leave the house unless they are doctor's appointment.  She has very limited social network.  Patient has a sister who lives in [place] but there has been no recent visit.  As per daughter patient has limited appetite but her weight has been stable.  As per  daughter patient denies drinking alcohol or using any illegal substances.  She is seeing cardiologist, neurologist, pain specialist, endocrinologist and primary care physician.  Her last hemoglobin A1c was 7.2 which was done in November 2018.  She recently saw a neurologist and prescribed primidone for tremors which helped the tremors.  Patient sleeps on and off mostly due to chronic pain.  Patient is willing to try low-dose antidepressant.  She denies any panic attack, phobia, PTSD, OCD or any nightmares.  She lives with her husband and her daughter.  Her daughter is very supportive.    Associated Signs/Symptoms: Depression Symptoms:  depressed mood, insomnia, feelings of worthlessness/guilt, difficulty concentrating, loss of energy/fatigue, disturbed sleep, (Hypo) Manic Symptoms:  Labiality of Mood, Anxiety Symptoms:  Excessive Worry, Psychotic Symptoms:  No psychotic symptoms. PTSD Symptoms: Negative  Past Psychiatric History: Patient seen psychiatrist in [place] more than 20 years ago but do not recall any details.  Patient denies any history of psychiatric inpatient treatment, mania, psychosis, hallucination, paranoia or any suicidal attempt.  Previous Psychotropic Medications: No   Substance Abuse History in the last 12 months:  No.  Consequences of Substance Abuse: Negative  Past Medical History:  Past Medical History:  Diagnosis Date  . Ascending aortic aneurysm ([place])    a. 08/2015 stable 5 cm Asc Ao Aneurysm.  . Bronchitis   . Carcinoid tumor   . COPD (chronic obstructive pulmonary disease) ([place])   . Coronary artery disease    a. Ca2+ noted on CT - no other clear documentation of CAD.  Marland Kitchen Difficulty in walking(719.7)   . Disturbance of skin  sensation   . Edema   . Essential hypertension   . GERD (gastroesophageal reflux disease)   . Hypercholesterolemia   . Hyperlipidemia   . Mitral valve disease    a. 10/2015 Echo: Mod MS, mild MR.  . Moderate aortic insufficiency     a. 10/2015 Echo: EF 50-55%, no rwma, mod AI, mod MS, mild MR, sev dil LA, mild TR/PR, PASP 98mHg.  . Pain in joint, forearm   . Pain in joint, hand   . Pain in joint, lower leg   . Pulmonary nodules    s. 08/2015 CT: stable lung nodules.  . Tremor, essential 08/30/2017  . [place]     Past Surgical History:  Procedure Laterality Date  . bronch with endobronchial biopies  09/15/2009  . COLONOSCOPY N/A 09/13/2013   Procedure: COLONOSCOPY;  Surgeon: MJeryl Columbia MD;  Location: [facility];  Service: Endoscopy;  Laterality: N/A;  . endobronchial excision of right upper lobe tumor with laser bronchi  10/06/2009  . fiberoptic bronch with endobronchial u/s  07/02/2010  . PARS PLANA VITRECTOMY     right eye  . PARS PLANA VITRECTOMY W/ ENDOPHOTOCOAGULATION     right eye  . posterior capsulectomy     right eye  . VIDEO BRONCHOSCOPY  12/09/2009    Family Psychiatric History: Reviewed.    Family History:  Family History  Problem Relation Age of Onset  . Hypertension Mother   . Hypertension Father   . Heart attack Neg Hx   . Stroke Neg Hx     Social History:   Social History   Socioeconomic History  . Marital status: Married    Spouse name: Not on file  . Number of children: 1  . Years of education: 8  . Highest education level: Not on file  Social Needs  . Financial resource strain: Not on file  . Food insecurity - worry: Not on file  . Food insecurity - inability: Not on file  . Transportation needs - medical: Not on file  . Transportation needs - non-medical: Not on file  Occupational History  . Not on file  Tobacco Use  . Smoking status: Never Smoker  . Smokeless tobacco: Never Used  Substance and Sexual Activity  . Alcohol use: No  . Drug use: No  . Sexual activity: Not Currently  Other Topics Concern  . Not on file  Social History Narrative   Lives with family (husband and daughter)    Caffeine use: Coffee/tea sometimes   Right  handed     Additional Social History:  [place]  She has only 1 daughter who lives with her.  [place]  Her husband lives with the patient.  Her daughter is very cooperative.  Patient has a sister who [place]  Allergies:  No Known Allergies  Metabolic Disorder Labs: Recent Results (from the past 2160 hour(s))  Comprehensive metabolic panel     Status: Abnormal   Collection Time: 10/10/17  2:05 PM  Result Value Ref Range   Sodium 138 136 - 145 mEq/L   Potassium 4.6 3.5 - 5.1 mEq/L   Chloride 103 98 - 109 mEq/L   CO2 26 22 - 29 mEq/L   Glucose 154 (H) 70 - 140 mg/dl    Comment: Glucose reference range is for nonfasting patients. Fasting glucose reference range is 70- 100.   BUN  21.3 7.0 - 26.0 mg/dL   Creatinine 0.9 0.6 - 1.1 mg/dL   Total Bilirubin 0.49 0.20 - 1.20 mg/dL   Alkaline Phosphatase 93 40 - 150 U/L   AST 20 5 - 34 U/L   ALT 15 0 - 55 U/L   Total Protein 7.1 6.4 - 8.3 g/dL   Albumin 3.7 3.5 - 5.0 g/dL   Calcium 9.0 8.4 - 10.4 mg/dL   Anion Gap 9 3 - 11 mEq/L   EGFR 57 (L) >60 ml/min/1.73 m2    Comment: eGFR is calculated using the CKD-EPI Creatinine Equation (2009)  CBC with Differential     Status: None   Collection Time: 10/10/17  2:05 PM  Result Value Ref Range   WBC 6.3 3.9 - 10.3 10e3/uL   NEUT# 3.4 1.5 - 6.5 10e3/uL   HGB 11.9 11.6 - 15.9 g/dL   HCT 36.8 34.8 - 46.6 %   Platelets 230 145 - 400 10e3/uL   MCV 95.6 79.5 - 101.0 fL   MCH 30.9 25.1 - 34.0 pg   MCHC 32.3 31.5 - 36.0 g/dL   RBC 3.85 3.70 - 5.45 10e6/uL   RDW 14.3 11.2 - 14.5 %   lymph# 2.2 0.9 - 3.3 10e3/uL   MONO# 0.6 0.1 - 0.9 10e3/uL   Eosinophils Absolute 0.1 0.0 - 0.5 10e3/uL   Basophils Absolute 0.0 0.0 - 0.1 10e3/uL   NEUT% 54.5 38.4 - 76.8 %   LYMPH% 34.6 14.0 - 49.7 %   MONO% 9.4 0.0 - 14.0 %   EOS% 1.3 0.0 - 7.0 %   BASO% 0.2 0.0 - 2.0 %   Lab Results  Component Value Date    HGBA1C 7.1 (H) 07/16/2015   MPG 157 07/16/2015   MPG 148 07/13/2015   No results found for: PROLACTIN No results found for: CHOL, TRIG, HDL, CHOLHDL, VLDL, LDLCALC   Current Medications: Current Outpatient Medications  Medication Sig Dispense Refill  . albuterol (PROVENTIL HFA;VENTOLIN HFA) 108 (90 Base) MCG/ACT inhaler Inhale 1-2 puffs into the lungs every 6 (six) hours as needed for wheezing or shortness of breath. 1 Inhaler 0  . amLODipine (NORVASC) 10 MG tablet TAKE 1 TABLET (10 MG TOTAL) BY MOUTH DAILY. 90 tablet 1  . aspirin EC 81 MG tablet Take 81 mg by mouth every evening. 7 pm    . atorvastatin (LIPITOR) 10 MG tablet Take 10 mg by mouth at bedtime.  12  . DULoxetine (CYMBALTA) 20 MG capsule Take 1 capsule (20 mg total) by mouth daily. 30 capsule 0  . FeFum-FePoly-FA-B Cmp-C-Biot (INTEGRA PLUS) CAPS TAKE ONE CAPSULE BY MOUTH EVERY DAY *NOT COVERED* 30 capsule 5  . fluticasone (VERAMYST) 27.5 MCG/SPRAY nasal spray Place 2 sprays into the nose daily.    . furosemide (LASIX) 20 MG tablet TAKE 1 TABLET (20 MG TOTAL) BY MOUTH DAILY AS NEEDED FOR FLUID OR EDEMA. 30 tablet 3  . glimepiride (AMARYL) 1 MG tablet Take 1 mg by mouth daily.     . hydrALAZINE (APRESOLINE) 50 MG tablet Take 1 tablet (50 mg total) by mouth 3 (three) times daily. 270 tablet 1  . montelukast (SINGULAIR) 10 MG tablet Take 10 mg by mouth at bedtime.  1  . Multiple Vitamin (MULITIVITAMIN WITH MINERALS) TABS Take 1 tablet by mouth daily.    . nitroGLYCERIN (NITROSTAT) 0.4 MG SL tablet PLACE 1 TABLET UNDER THE TONGUE EVERY 5 MINUTES AS NEEDED FOR CHEST PAIN 25 tablet 2  . nystatin cream (MYCOSTATIN) Apply to  affected area 2 times daily 60 g 2  . olmesartan (BENICAR) 40 MG tablet TAKE 1 TABLET (40 MG TOTAL) BY MOUTH 2 (TWO) TIMES DAILY. 180 tablet 1  . Omega-3 Fatty Acids (FISH OIL) 1200 MG CAPS Take 1 capsule by mouth daily.    Marland Kitchen omeprazole (PRILOSEC) 40 MG capsule Take 40 mg by mouth daily.    Marland Kitchen oxybutynin  (DITROPAN-XL) 10 MG 24 hr tablet Take 10 mg daily by mouth.  1  . pioglitazone (ACTOS) 15 MG tablet Take 7.5 mg by mouth daily.     . potassium chloride (K-DUR,KLOR-CON) 10 MEQ tablet Take 20 mEq by mouth daily.    . primidone (MYSOLINE) 50 MG tablet Take 1 tablet (50 mg total) by mouth at bedtime. 90 tablet 0  . Probiotic Product (PROBIOTIC PO) Take 1 tablet by mouth daily.    Marland Kitchen Spacer/Aero-Holding Chambers (AEROCHAMBER MINI CHAMBER) DEVI use as directed with your inhalers     . traMADol (ULTRAM) 50 MG tablet Take 2 tablets (100 mg total) by mouth every 8 (eight) hours as needed. 180 tablet 5  . triamcinolone cream (KENALOG) 0.1 % Apply 1 application topically 2 (two) times daily. 30 g 0   No current facility-administered medications for this visit.     Neurologic: Headache: Yes Seizure: No Paresthesias:Yes  Musculoskeletal: Strength & Muscle Tone: decreased Gait & Station: unsteady Patient leans: Right and Left  Psychiatric Specialty Exam: Review of Systems  Musculoskeletal: Positive for back pain and joint pain.  Skin: Negative.   Neurological: Positive for tremors.  Psychiatric/Behavioral: Positive for depression. The patient is nervous/anxious and has insomnia.     Blood pressure 140/84, pulse 78, height 5' (1.524 m), weight 158 lb (71.7 kg).There is no height or weight on file to calculate BMI.  General Appearance: Casual  Eye Contact:  Fair  Speech:  Slow  Volume:  Normal  Mood:  Anxious and Dysphoric  Affect:  Congruent and Constricted  Thought Process:  Goal Directed  Orientation:  Full (Time, Place, and Person)  Thought Content:  Rumination  Suicidal Thoughts:  No  Homicidal Thoughts:  No  Memory:  Immediate;   Fair Recent;   Fair Remote;   Fair  Judgement:  Fair  Insight:  Good  Psychomotor Activity:  Tremor  Concentration:  Concentration: Fair and Attention Span: Fair  Recall:  [employer] of Knowledge:Fair  Language: Fair  Akathisia:  No  Handed:  Right   AIMS (if indicated):  0  Assets:  Desire for Improvement Housing Social Support  ADL's:  Intact  Cognition: WNL  Sleep: Fair   Assessment: Major depressive disorder, recurrent mild.  Plan: I review her symptoms, history, current medication, recent blood work results and collect information from other providers.  Patient does not speak English and most of the information was obtained through her daughter.  Patient and her daughter refused to have translator.  I do believe patient has mild depression and trial of antidepressant is worth to try.  Due to chronic pain we will try Cymbalta 20 mg daily to help her depression and chronic pain.  Discussed medication side effects in detail especially in the beginning can cause GI symptoms and may cause worsening of the tremors.  Patient and her daughter are willing to try low-dose Cymbalta.  I recommended if symptoms do not improve or having any side effects then she should call us immediately.  I also discussed safety concerns at any time having active suicidal thoughts or homicidal  thought and she need to call 911 or go to local emergency room.  I will see her again in 3 weeks.  We will consider optimize dose on her next appointment.  Time spent 55 minutes.  More than 50% of the time spent in reviewing collect information, blood work, explaining medication side effects and long-term prognosis.  Kathlee Nations, MD 3/6/20191:34 PM

## 2018-01-16 ENCOUNTER — Ambulatory Visit
Admission: RE | Admit: 2018-01-16 | Discharge: 2018-01-16 | Disposition: A | Discharge status: Home / Self Care | Payer: Medicare Other | Source: Ambulatory Visit | Attending: Orthopedic Surgery | Admitting: Orthopedic Surgery

## 2018-01-16 DIAGNOSIS — M13851 Other specified arthritis, right hip: Secondary | ICD-10-CM

## 2018-01-16 DIAGNOSIS — M1611 Unilateral primary osteoarthritis, right hip: Secondary | ICD-10-CM | POA: Diagnosis not present

## 2018-01-16 MED ORDER — METHYLPREDNISOLONE ACETATE 40 MG/ML INJ SUSP (RADIOLOG
120.0000 mg | Freq: Once | INTRAMUSCULAR | Status: AC
  Administered 2018-01-16: 120 mg via INTRA_ARTICULAR

## 2018-01-16 MED ORDER — IOPAMIDOL (ISOVUE-M 200) INJECTION 41%
1.0000 mL | Freq: Once | INTRAMUSCULAR | Status: AC
  Administered 2018-01-16: 1 mL via INTRA_ARTICULAR

## 2018-01-24 ENCOUNTER — Encounter (HOSPITAL_COMMUNITY): Payer: Self-pay | Admitting: Psychiatry

## 2018-01-24 ENCOUNTER — Ambulatory Visit (INDEPENDENT_AMBULATORY_CARE_PROVIDER_SITE_OTHER): Payer: Medicare Other | Admitting: Psychiatry

## 2018-01-24 DIAGNOSIS — G894 Chronic pain syndrome: Secondary | ICD-10-CM

## 2018-01-24 DIAGNOSIS — M549 Dorsalgia, unspecified: Secondary | ICD-10-CM

## 2018-01-24 DIAGNOSIS — F330 Major depressive disorder, recurrent, mild: Secondary | ICD-10-CM | POA: Diagnosis not present

## 2018-01-24 DIAGNOSIS — R251 Tremor, unspecified: Secondary | ICD-10-CM

## 2018-01-24 DIAGNOSIS — M1990 Unspecified osteoarthritis, unspecified site: Secondary | ICD-10-CM | POA: Diagnosis not present

## 2018-01-24 DIAGNOSIS — M2550 Pain in unspecified joint: Secondary | ICD-10-CM

## 2018-01-24 MED ORDER — DULOXETINE HCL 30 MG PO CPEP: 30.0000 mg | ORAL_CAPSULE | Freq: Every day | ORAL | 0 refills | Status: DC

## 2018-01-24 MED ORDER — DULOXETINE HCL 20 MG PO CPEP: 20.0000 mg | ORAL_CAPSULE | Freq: Every day | ORAL | 0 refills | Status: DC

## 2018-01-24 NOTE — Progress Notes (Signed)
BH MD/PA/NP OP Progress Note  01/24/2018 2:48 PM [name]  MRN:  [id number]  Chief Complaint: I like medication.  I have more energy.  HPI: Patient is 84 year old 48 married unemployed female who was seen first time 4 weeks ago for initial evaluation.  Patient came with her daughter who translate her as patient and her family member does not want translation services.  We started her on Cymbalta 20 mg.  She is tolerating 20 mg and noticed improvement in her energy and fatigue.  However patient still have a lot of somatic complaints including chronic pain, arthritis, difficulty walking and tremors.  Patient endorsed Cymbalta helping her energy level and she feels less sad and less depressed.  She has no side effects.  She has no worsening of tremors or any EPS.  Her sleep is good.  She denies any mania, psychosis or any hallucination.  Her major concern is chronic pain and lack of motivation to do things.  Patient uses walker for ambulation.  She is not aggressive or having any suicidal thoughts or homicidal thought.  She lives with her husband and her daughter.  Her daughter is very supportive.  Visit Diagnosis:    ICD-10-CM   1. Mild episode of recurrent major depressive disorder (HCC) F33.0 DULoxetine (CYMBALTA) 30 MG capsule    DISCONTINUED: DULoxetine (CYMBALTA) 20 MG capsule    Past Psychiatric History: Reviewed. Patient seen psychiatrist in [place] more than 20 years ago but do not recall details.  She denies any history of mania, psychosis, hallucination or any suicidal attempts.  Past Medical History:  Past Medical History:  Diagnosis Date  . Ascending aortic aneurysm ([place])    a. 08/2015 stable 5 cm Asc Ao Aneurysm.  . Bronchitis   . Carcinoid tumor   . COPD (chronic obstructive pulmonary disease) ([place])   . Coronary artery disease    a. Ca2+ noted on CT - no other clear documentation of CAD.  Marland Kitchen Difficulty in walking(719.7)   . Disturbance of skin sensation   .  Edema   . Essential hypertension   . GERD (gastroesophageal reflux disease)   . Hypercholesterolemia   . Hyperlipidemia   . Mitral valve disease    a. 10/2015 Echo: Mod MS, mild MR.  . Moderate aortic insufficiency    a. 10/2015 Echo: EF 50-55%, no rwma, mod AI, mod MS, mild MR, sev dil LA, mild TR/PR, PASP 28mmHg.  . Pain in joint, forearm   . Pain in joint, hand   . Pain in joint, lower leg   . Pulmonary nodules    s. 08/2015 CT: stable lung nodules.  . Tremor, essential 08/30/2017  . Type II diabetes mellitus ([place])     Past Surgical History:  Procedure Laterality Date  . bronch with endobronchial biopies  09/15/2009  . COLONOSCOPY N/A 09/13/2013   Procedure: COLONOSCOPY;  Surgeon: Jeryl Columbia, MD;  Location: [facility];  Service: Endoscopy;  Laterality: N/A;  . endobronchial excision of right upper lobe tumor with laser bronchi  10/06/2009  . fiberoptic bronch with endobronchial u/s  07/02/2010  . PARS PLANA VITRECTOMY     right eye  . PARS PLANA VITRECTOMY W/ ENDOPHOTOCOAGULATION     right eye  . posterior capsulectomy     right eye  . VIDEO BRONCHOSCOPY  12/09/2009    Family Psychiatric History: Reviewed.  Family History:  Family History  Problem Relation Age of Onset  . Hypertension Mother   . Hypertension Father   .  Heart attack Neg Hx   . Stroke Neg Hx     Social History:  Social History   Socioeconomic History  . Marital status: Married    Spouse name: Not on file  . Number of children: 1  . Years of education: 8  . Highest education level: Not on file  Occupational History  . Not on file  Social Needs  . Financial resource strain: Not on file  . Food insecurity:    Worry: Not on file    Inability: Not on file  . Transportation needs:    Medical: Not on file    Non-medical: Not on file  Tobacco Use  . Smoking status: Never Smoker  . Smokeless tobacco: Never Used  Substance and Sexual Activity  . Alcohol use: No  . Drug use: No  . Sexual  activity: Not Currently  Lifestyle  . Physical activity:    Days per week: Not on file    Minutes per session: Not on file  . Stress: Not on file  Relationships  . Social connections:    Talks on phone: Not on file    Gets together: Not on file    Attends religious service: Not on file    Active member of club or organization: Not on file    Attends meetings of clubs or organizations: Not on file    Relationship status: Not on file  Other Topics Concern  . Not on file  Social History Narrative   Lives with family (husband and daughter)    Caffeine use: Coffee/tea sometimes   Right handed     Allergies: No Known Allergies  Metabolic Disorder Labs: Lab Results  Component Value Date   HGBA1C 7.1 (H) 07/16/2015   MPG 157 07/16/2015   MPG 148 07/13/2015   No results found for: PROLACTIN No results found for: CHOL, TRIG, HDL, CHOLHDL, VLDL, LDLCALC Lab Results  Component Value Date   TSH 1.792 07/13/2015   TSH 0.285 (L) 10/24/2011    Therapeutic Level Labs: No results found for: LITHIUM No results found for: VALPROATE No components found for:  CBMZ  Current Medications: Current Outpatient Medications  Medication Sig Dispense Refill  . albuterol (PROVENTIL HFA;VENTOLIN HFA) 108 (90 Base) MCG/ACT inhaler Inhale 1-2 puffs into the lungs every 6 (six) hours as needed for wheezing or shortness of breath. 1 Inhaler 0  . amLODipine (NORVASC) 10 MG tablet TAKE 1 TABLET (10 MG TOTAL) BY MOUTH DAILY. 90 tablet 1  . aspirin EC 81 MG tablet Take 81 mg by mouth every evening. 7 pm    . atorvastatin (LIPITOR) 10 MG tablet Take 10 mg by mouth at bedtime.  12  . DULoxetine (CYMBALTA) 20 MG capsule Take 1 capsule (20 mg total) by mouth daily. 30 capsule 0  . FeFum-FePoly-FA-B Cmp-C-Biot (INTEGRA PLUS) CAPS TAKE ONE CAPSULE BY MOUTH EVERY DAY *NOT COVERED* 30 capsule 5  . fluticasone (VERAMYST) 27.5 MCG/SPRAY nasal spray Place 2 sprays into the nose daily.    . furosemide (LASIX) 20 MG  tablet TAKE 1 TABLET (20 MG TOTAL) BY MOUTH DAILY AS NEEDED FOR FLUID OR EDEMA. 30 tablet 3  . glimepiride (AMARYL) 1 MG tablet Take 1 mg by mouth daily.     . hydrALAZINE (APRESOLINE) 50 MG tablet Take 1 tablet (50 mg total) by mouth 3 (three) times daily. 270 tablet 1  . montelukast (SINGULAIR) 10 MG tablet Take 10 mg by mouth at bedtime.  1  . Multiple Vitamin (MULITIVITAMIN  WITH MINERALS) TABS Take 1 tablet by mouth daily.    . nitroGLYCERIN (NITROSTAT) 0.4 MG SL tablet PLACE 1 TABLET UNDER THE TONGUE EVERY 5 MINUTES AS NEEDED FOR CHEST PAIN 25 tablet 2  . nystatin cream (MYCOSTATIN) Apply to affected area 2 times daily 60 g 2  . olmesartan (BENICAR) 40 MG tablet TAKE 1 TABLET (40 MG TOTAL) BY MOUTH 2 (TWO) TIMES DAILY. 180 tablet 1  . Omega-3 Fatty Acids (FISH OIL) 1200 MG CAPS Take 1 capsule by mouth daily.    Marland Kitchen omeprazole (PRILOSEC) 40 MG capsule Take 40 mg by mouth daily.    Marland Kitchen oxybutynin (DITROPAN-XL) 10 MG 24 hr tablet Take 10 mg daily by mouth.  1  . pioglitazone (ACTOS) 15 MG tablet Take 7.5 mg by mouth daily.     . potassium chloride (K-DUR,KLOR-CON) 10 MEQ tablet Take 20 mEq by mouth daily.    . primidone (MYSOLINE) 50 MG tablet Take 1 tablet (50 mg total) by mouth at bedtime. 90 tablet 0  . Probiotic Product (PROBIOTIC PO) Take 1 tablet by mouth daily.    Marland Kitchen Spacer/Aero-Holding Chambers (AEROCHAMBER MINI CHAMBER) DEVI use as directed with your inhalers     . traMADol (ULTRAM) 50 MG tablet Take 2 tablets (100 mg total) by mouth every 8 (eight) hours as needed. 180 tablet 5  . triamcinolone cream (KENALOG) 0.1 % Apply 1 application topically 2 (two) times daily. 30 g 0   No current facility-administered medications for this visit.      Musculoskeletal: Strength & Muscle Tone: decreased Gait & Station: unsteady Patient leans: Right and Left  Psychiatric Specialty Exam: Review of Systems  Musculoskeletal: Positive for back pain and joint pain.  Neurological: Positive for  tingling and tremors.    Blood pressure 138/76, pulse 88, height 5' (1.524 m), weight 156 lb (70.8 kg), SpO2 95 %.Body mass index is 30.47 kg/m.  General Appearance: Casual  Eye Contact:  Fair  Speech:  Slow  Volume:  Normal  Mood:  Dysphoric  Affect:  Congruent  Thought Process:  Goal Directed  Orientation:  Full (Time, Place, and Person)  Thought Content: Rumination   Suicidal Thoughts:  No  Homicidal Thoughts:  No  Memory:  Immediate;   Fair Recent;   Fair Remote;   Fair  Judgement:  Good  Insight:  Good  Psychomotor Activity:  Tremor  Concentration:  Concentration: Fair and Attention Span: Fair  Recall:  [employer] of Knowledge: Fair  Language: does not speek englich  Akathisia:  No  Handed:  Right  AIMS (if indicated): not done  Assets:  Desire for Improvement Housing Resilience Social Support  ADL's:  Intact  Cognition: WNL  Sleep:  Good   Screenings: PHQ2-9     [facility]  PHQ-2 Total Score  3  3  0  PHQ-9 Total Score  -  7  11       Assessment and Plan: Major depressive disorder, recurrent mild.  Patient tolerating Cymbalta 20 mg daily.  Recommended to try 30 mg to help residual depressive symptoms.  So far patient has no side effects.  Patient agreed with the plan.  Plan discussed with the daughter who translate the patient.  Patient and the family does not want any translation services.  I recommended to call us back if she has any  question or any concern.  Follow-up in 3 months.   Kathlee Nations, MD 01/24/2018, 2:48 PM

## 2018-01-30 ENCOUNTER — Encounter: Payer: Self-pay | Admitting: Neurology

## 2018-01-30 ENCOUNTER — Ambulatory Visit (INDEPENDENT_AMBULATORY_CARE_PROVIDER_SITE_OTHER): Payer: Medicare Other | Admitting: Neurology

## 2018-01-30 ENCOUNTER — Telehealth: Payer: Self-pay | Admitting: Neurology

## 2018-01-30 VITALS — BP 161/82 | HR 89 | Ht 60.0 in | Wt 157.0 lb

## 2018-01-30 DIAGNOSIS — G250 Essential tremor: Secondary | ICD-10-CM

## 2018-01-30 NOTE — Progress Notes (Signed)
Reason for visit: Tremor  [name] is an 84 y.o. female  History of present illness:  [name] is an 84 year old right-handed [place] female with a history of a tremor that has been present most of her life.  The patient has been placed on Mysoline 50 mg at night but she is not clear if this really helped her tremor.  The patient comes with her daughter who is helping to interpret for her.  The patient has a gait disorder, she uses a walker for ambulation.  She has not had any recent falls.  The patient has tolerated the Mysoline well, she does not have too much drowsiness during the day.  It is not clear whether or not the patient really wants the tremor treated or not.  Past Medical History:  Diagnosis Date  . Ascending aortic aneurysm ([place])    a. 08/2015 stable 5 cm Asc Ao Aneurysm.  . Bronchitis   . Carcinoid tumor   . COPD (chronic obstructive pulmonary disease) ([place])   . Coronary artery disease    a. Ca2+ noted on CT - no other clear documentation of CAD.  Marland Kitchen Difficulty in walking(719.7)   . Disturbance of skin sensation   . Edema   . Essential hypertension   . GERD (gastroesophageal reflux disease)   . Hypercholesterolemia   . Hyperlipidemia   . Mitral valve disease    a. 10/2015 Echo: Mod MS, mild MR.  . Moderate aortic insufficiency    a. 10/2015 Echo: EF 50-55%, no rwma, mod AI, mod MS, mild MR, sev dil LA, mild TR/PR, PASP 79mmHg.  . Pain in joint, forearm   . Pain in joint, hand   . Pain in joint, lower leg   . Pulmonary nodules    s. 08/2015 CT: stable lung nodules.  . Tremor, essential 08/30/2017  . Type II diabetes mellitus ([place])     Past Surgical History:  Procedure Laterality Date  . bronch with endobronchial biopies  09/15/2009  . COLONOSCOPY N/A 09/13/2013   Procedure: COLONOSCOPY;  Surgeon: Jeryl Columbia, MD;  Location: [facility];  Service: Endoscopy;  Laterality: N/A;  . endobronchial excision of right upper lobe tumor with laser bronchi   10/06/2009  . fiberoptic bronch with endobronchial u/s  07/02/2010  . PARS PLANA VITRECTOMY     right eye  . PARS PLANA VITRECTOMY W/ ENDOPHOTOCOAGULATION     right eye  . posterior capsulectomy     right eye  . VIDEO BRONCHOSCOPY  12/09/2009    Family History  Problem Relation Age of Onset  . Hypertension Mother   . Hypertension Father   . Heart attack Neg Hx   . Stroke Neg Hx     Social history:  reports that she has never smoked. She has never used smokeless tobacco. She reports that she does not drink alcohol or use drugs.   No Known Allergies  Medications:  Prior to Admission medications   Medication Sig Start Date End Date Taking? Authorizing Provider  albuterol (PROVENTIL HFA;VENTOLIN HFA) 108 (90 Base) MCG/ACT inhaler Inhale 1-2 puffs into the lungs every 6 (six) hours as needed for wheezing or shortness of breath. 12/02/16  Yes Long, Wonda Olds, MD  amLODipine (NORVASC) 10 MG tablet TAKE 1 TABLET (10 MG TOTAL) BY MOUTH DAILY. 08/24/17  Yes Josue Hector, MD  aspirin EC 81 MG tablet Take 81 mg by mouth every evening. 7 pm   Yes [provider]  atorvastatin (LIPITOR)  10 MG tablet Take 10 mg by mouth at bedtime. 06/17/15  Yes [provider]  DULoxetine (CYMBALTA) 30 MG capsule Take 1 capsule (30 mg total) by mouth daily. 01/24/18 01/24/19 Yes Arfeen, Arlyce [name], MD  FeFum-FePoly-FA-B Cmp-C-Biot (INTEGRA PLUS) CAPS TAKE ONE CAPSULE BY MOUTH EVERY DAY *NOT COVERED* 08/30/17  Yes Curt Bears, MD  fluticasone (VERAMYST) 27.5 MCG/SPRAY nasal spray Place 2 sprays into the nose daily.   Yes [provider]  furosemide (LASIX) 20 MG tablet TAKE 1 TABLET (20 MG TOTAL) BY MOUTH DAILY AS NEEDED FOR FLUID OR EDEMA. 10/15/16  Yes Josue Hector, MD  glimepiride (AMARYL) 1 MG tablet Take 1 mg by mouth daily.    Yes [provider]  hydrALAZINE (APRESOLINE) 50 MG tablet Take 1 tablet (50 mg total) by mouth 3 (three) times daily. 10/07/17  Yes Josue Hector,  MD  montelukast (SINGULAIR) 10 MG tablet Take 10 mg by mouth at bedtime. 06/04/15  Yes [provider]  Multiple Vitamin (MULITIVITAMIN WITH MINERALS) TABS Take 1 tablet by mouth daily.   Yes [provider]  nitroGLYCERIN (NITROSTAT) 0.4 MG SL tablet PLACE 1 TABLET UNDER THE TONGUE EVERY 5 MINUTES AS NEEDED FOR CHEST PAIN 09/14/16  Yes Josue Hector, MD  nystatin cream (MYCOSTATIN) Apply to affected area 2 times daily 03/21/17  Yes Drenda Freeze, MD  olmesartan (BENICAR) 40 MG tablet TAKE 1 TABLET (40 MG TOTAL) BY MOUTH 2 (TWO) TIMES DAILY. 11/18/17  Yes Josue Hector, MD  Omega-3 Fatty Acids (FISH OIL) 1200 MG CAPS Take 1 capsule by mouth daily.   Yes [provider]  omeprazole (PRILOSEC) 40 MG capsule Take 40 mg by mouth daily.   Yes [provider]  oxybutynin (DITROPAN-XL) 10 MG 24 hr tablet Take 10 mg daily by mouth. 08/19/17  Yes [provider]  pioglitazone (ACTOS) 15 MG tablet Take 7.5 mg by mouth daily.    Yes [provider]  potassium chloride (K-DUR,KLOR-CON) 10 MEQ tablet Take 20 mEq by mouth daily.   Yes [provider]  primidone (MYSOLINE) 50 MG tablet Take 1 tablet (50 mg total) by mouth at bedtime. 12/19/17  Yes Kathrynn Ducking, MD  Probiotic Product (PROBIOTIC PO) Take 1 tablet by mouth daily.   Yes [provider]  Spacer/Aero-Holding Chambers (AEROCHAMBER MINI CHAMBER) DEVI use as directed with your inhalers  04/27/11  Yes [provider]  traMADol (ULTRAM) 50 MG tablet Take 2 tablets (100 mg total) by mouth every 8 (eight) hours as needed. 08/15/17  Yes Kirsteins, Luanna Salk, MD  triamcinolone cream (KENALOG) 0.1 % Apply 1 application topically 2 (two) times daily. 03/18/17  Yes Barnet Glasgow, NP    ROS:  Out of a complete 14 system review of symptoms, the patient complains only of the following symptoms, and all other reviewed systems are negative.  Decreased activity, decreased  appetite Shortness of breath Leg swelling Constipation Joint pain Weakness Depression  Blood pressure (!) 161/82, pulse 89, height 5' (1.524 m), weight 157 lb (71.2 kg).  Physical Exam  General: The patient is alert and cooperative at the time of the examination.  The patient is moderately to markedly obese.  Skin: 2+ edema below the knees is seen bilaterally.   Neurologic Exam  Mental status: The patient is alert and oriented x 3 at the time of the examination. The patient has apparent normal recent and remote memory, with an apparently normal attention span and concentration ability.  Cranial nerves: Facial symmetry is present. Speech is normal, no aphasia or dysarthria is noted. Extraocular movements are full. Visual fields are full.  Motor: The patient has good strength in all 4 extremities.  Sensory examination: Soft touch sensation is symmetric on the face, arms, and legs.  Coordination: The patient has good finger-nose-finger and heel-to-shin bilaterally.  The patient does have an action tremor with finger-nose-finger bilaterally.  Gait and station: The patient has a slightly wide-based, unsteady gait, she walks with a walker.  Appears to be some mobility issues with the right greater than left hip.  Tandem gait was not attempted.  Reflexes: Deep tendon reflexes are symmetric.   Assessment/Plan:  1.  Essential tremor  The patient is on Mysoline 50 mg a day, it is not clear to me whether the patient wishes to increase the medication to treat the tremor or whether she does not really care whether she has the tremor or not.  At any rate, the daughter indicates that we will continue the current dose of the medication, she will follow-up in 6 months, if they do wish to have a medication dose change, they are to contact my office.  Jill Alexanders MD 01/30/2018 4:08 PM  [facility] [address] [place], [place] [address]  Phone  [phone] Fax [phone]

## 2018-01-30 NOTE — Telephone Encounter (Signed)
Patient states she will call us to r/s 6 mo f/u w/ NP.

## 2018-01-31 IMAGING — XA DG FLUORO GUIDE NDL PLC/BX
1 series · 1 of 1 positions shown · non-contrast
Comparison: none

CLINICAL DATA: Severe degenerative osteoarthritis.  RIGHT hip pain.

[Series 1: ortho standard · 1 of 1 slices shown]
[im 1/1]
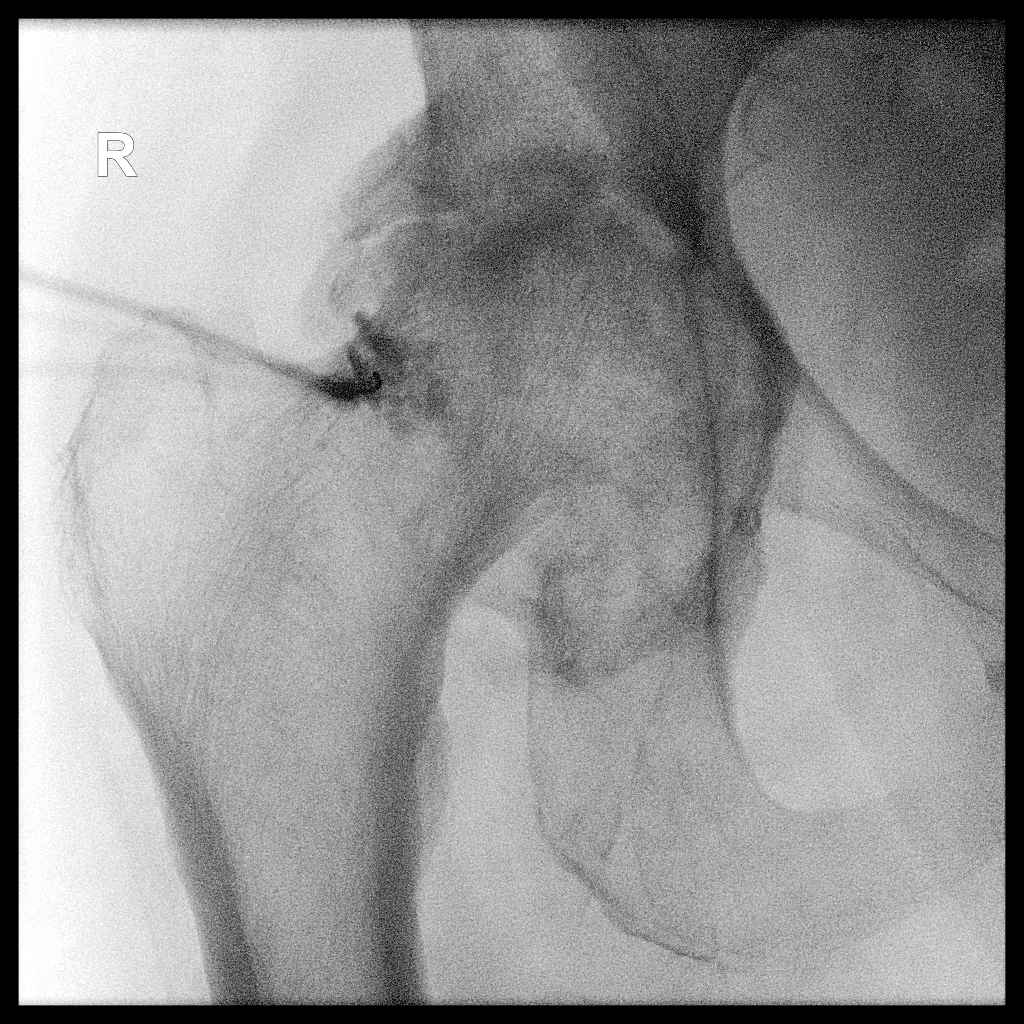

[1 of 1 positions shown; findings below may reference images not displayed]

FLUOROSCOPY TIME:  5 seconds corresponding to a Dose Area Product of
4.79 ?Gy*m2

PROCEDURE:
HIP INJECTION UNDER FLUOROSCOPY

After a thorough discussion of risks and benefits of the procedure
including bleeding, infection, injuries to adjacent structures and
extra-articular injection, written and oral informed consent was
obtained. Verbal consent was obtained by Dr. RIKI. Time out form
completed (when appropriate).

The patient was placed supine on the fluoroscopy table. Preliminary
localization of the RIGHT hip was performed. The skin was prepped
and draped in the usual sterile fashion. Local anesthesia was
provided with 1% Lidocaine without Epinephrine. Under fluoroscopic
guidance, a 22 gauge 3 1/2 inch spinal needle was advanced into the
hip joint. Subsequently injection of 3 mL Isovue M 200 contrast
agent confirmed intra-articular placement. No vascular uptake. The
image was captured showing intra-articular contrast and a solution
of 120 mg Depo-Medrol and 1% Lidocaine without Epinephrine for a
volume of 3 ml along with 2 ml of 1% lidocaine was injected into the
hip. The needles were removed and a sterile dressing applied. The
patient tolerated a procedure well and was discharged.
IMPRESSION: Technically successful RIGHT hip injection of steroid and
anesthetic.

## 2018-02-03 ENCOUNTER — Encounter: Payer: Self-pay | Admitting: Podiatry

## 2018-02-03 ENCOUNTER — Ambulatory Visit (INDEPENDENT_AMBULATORY_CARE_PROVIDER_SITE_OTHER): Payer: Medicare Other | Admitting: Podiatry

## 2018-02-03 DIAGNOSIS — M79675 Pain in left toe(s): Secondary | ICD-10-CM

## 2018-02-03 DIAGNOSIS — B351 Tinea unguium: Secondary | ICD-10-CM

## 2018-02-03 DIAGNOSIS — M79674 Pain in right toe(s): Secondary | ICD-10-CM | POA: Diagnosis not present

## 2018-02-03 MED ORDER — CICLOPIROX 8 % EX SOLN: Freq: Every day | CUTANEOUS | 2 refills | Status: AC

## 2018-02-06 DIAGNOSIS — K219 Gastro-esophageal reflux disease without esophagitis: Secondary | ICD-10-CM | POA: Diagnosis not present

## 2018-02-06 DIAGNOSIS — K5900 Constipation, unspecified: Secondary | ICD-10-CM | POA: Diagnosis not present

## 2018-02-06 NOTE — Progress Notes (Signed)
Subjective: 84 y.o. returns the office today for painful, elongated, thickened toenails which she cannot trim herself.  Denies any redness or drainage around the nails.  Denies any open wounds. Denies any acute changes since last appointment and no new complaints today. Denies any systemic complaints such as fevers, chills, nausea, vomiting.   Objective: NAD DP/PT pulses palpable, CRT less than 3 seconds Nails hypertrophic, dystrophic, elongated, brittle, discolored 10. There is tenderness overlying the nails 1-5 bilaterally. There is no surrounding erythema or drainage along the nail sites. No open lesions or pre-ulcerative lesions are identified. No other areas of tenderness bilateral lower extremities. No overlying edema, erythema, increased warmth. No pain with calf compression, swelling, warmth, erythema. Overall no changes  Assessment: Patient presents with symptomatic onychomycosis  Plan: -Treatment options including alternatives, risks, complications were discussed -Nails sharply debrided 10 without complication/bleeding. -Discussed daily foot inspection. If there are any changes, to call the office immediately.  -Follow-up in 3 months or sooner if any problems are to arise. In the meantime, encouraged to call the office with any questions, concerns, changes symptoms.  Celesta Gentile, DPM

## 2018-02-11 ENCOUNTER — Other Ambulatory Visit: Payer: Self-pay

## 2018-02-11 ENCOUNTER — Emergency Department (HOSPITAL_COMMUNITY): Payer: Medicare Other

## 2018-02-11 ENCOUNTER — Encounter (HOSPITAL_COMMUNITY): Payer: Self-pay

## 2018-02-11 ENCOUNTER — Emergency Department (HOSPITAL_COMMUNITY)
Admission: EM | Admit: 2018-02-11 | Discharge: 2018-02-11 | Disposition: A | Discharge status: Home / Self Care | Payer: Medicare Other | Attending: Emergency Medicine | Admitting: Emergency Medicine

## 2018-02-11 DIAGNOSIS — Z7982 Long term (current) use of aspirin: Secondary | ICD-10-CM | POA: Insufficient documentation

## 2018-02-11 DIAGNOSIS — J449 Chronic obstructive pulmonary disease, unspecified: Secondary | ICD-10-CM | POA: Diagnosis not present

## 2018-02-11 DIAGNOSIS — Z79899 Other long term (current) drug therapy: Secondary | ICD-10-CM | POA: Diagnosis not present

## 2018-02-11 DIAGNOSIS — R05 Cough: Secondary | ICD-10-CM | POA: Diagnosis not present

## 2018-02-11 DIAGNOSIS — I10 Essential (primary) hypertension: Secondary | ICD-10-CM | POA: Diagnosis not present

## 2018-02-11 DIAGNOSIS — R059 Cough, unspecified: Secondary | ICD-10-CM

## 2018-02-11 DIAGNOSIS — J3489 Other specified disorders of nose and nasal sinuses: Secondary | ICD-10-CM | POA: Diagnosis not present

## 2018-02-11 DIAGNOSIS — R0789 Other chest pain: Secondary | ICD-10-CM | POA: Diagnosis not present

## 2018-02-11 DIAGNOSIS — E119 Type 2 diabetes mellitus without complications: Secondary | ICD-10-CM | POA: Insufficient documentation

## 2018-02-11 DIAGNOSIS — R0981 Nasal congestion: Secondary | ICD-10-CM | POA: Diagnosis not present

## 2018-02-11 LAB — I-STAT TROPONIN, ED: TROPONIN I, POC: 0.03 ng/mL (ref 0.00–0.08)

## 2018-02-11 LAB — BASIC METABOLIC PANEL
Anion gap: 9 (ref 5–15)
BUN: 14 mg/dL (ref 6–20)
CALCIUM: 8.8 mg/dL — AB (ref 8.9–10.3)
CO2: 23 mmol/L (ref 22–32)
CREATININE: 0.73 mg/dL (ref 0.44–1.00)
Chloride: 101 mmol/L (ref 101–111)
Glucose, Bld: 222 mg/dL — ABNORMAL HIGH (ref 65–99)
Potassium: 4.2 mmol/L (ref 3.5–5.1)
SODIUM: 133 mmol/L — AB (ref 135–145)

## 2018-02-11 LAB — CBC
HCT: 39 % (ref 36.0–46.0)
Hemoglobin: 12.7 g/dL (ref 12.0–15.0)
MCH: 30 pg (ref 26.0–34.0)
MCHC: 32.6 g/dL (ref 30.0–36.0)
MCV: 92.2 fL (ref 78.0–100.0)
PLATELETS: 278 10*3/uL (ref 150–400)
RBC: 4.23 MIL/uL (ref 3.87–5.11)
RDW: 13.8 % (ref 11.5–15.5)
WBC: 10.1 10*3/uL (ref 4.0–10.5)

## 2018-02-11 LAB — BRAIN NATRIURETIC PEPTIDE: B Natriuretic Peptide: 313 pg/mL — ABNORMAL HIGH (ref 0.0–100.0)

## 2018-02-11 MED ORDER — BENZONATATE 100 MG PO CAPS: 100.0000 mg | ORAL_CAPSULE | Freq: Three times a day (TID) | ORAL | 0 refills | Status: DC

## 2018-02-11 MED ORDER — IPRATROPIUM-ALBUTEROL 0.5-2.5 (3) MG/3ML IN SOLN
3.0000 mL | Freq: Once | RESPIRATORY_TRACT | Status: AC
  Administered 2018-02-11: 3 mL via RESPIRATORY_TRACT
  Filled 2018-02-11: qty 3

## 2018-02-11 NOTE — ED Provider Notes (Signed)
[facility] DEPARTMENT Provider Note   CSN: [id number] Arrival date & time: 02/11/18  1016     History   Chief Complaint Chief Complaint  Patient presents with  . Cough    HPI [name] is a 84 y.o. female.  The history is provided by the patient, medical records and a relative. The history is limited by a language barrier. A language interpreter was used.  Cough  This is a new problem. The current episode started more than 2 days ago. The problem occurs constantly. The problem has not changed since onset.The cough is productive of sputum. There has been no fever. Associated symptoms include rhinorrhea and wheezing. Pertinent negatives include no chest pain, no chills, no sweats, no ear congestion, no headaches and no shortness of breath. She has tried nothing for the symptoms. The treatment provided no relief. Her past medical history is significant for COPD.    Past Medical History:  Diagnosis Date  . Ascending aortic aneurysm ([place])    a. 08/2015 stable 5 cm Asc Ao Aneurysm.  . Bronchitis   . Carcinoid tumor   . COPD (chronic obstructive pulmonary disease) ([place])   . Coronary artery disease    a. Ca2+ noted on CT - no other clear documentation of CAD.  Marland Kitchen Difficulty in walking(719.7)   . Disturbance of skin sensation   . Edema   . Essential hypertension   . GERD (gastroesophageal reflux disease)   . Hypercholesterolemia   . Hyperlipidemia   . Mitral valve disease    a. 10/2015 Echo: Mod MS, mild MR.  . Moderate aortic insufficiency    a. 10/2015 Echo: EF 50-55%, no rwma, mod AI, mod MS, mild MR, sev dil LA, mild TR/PR, PASP 58mmHg.  . Pain in joint, forearm   . Pain in joint, hand   . Pain in joint, lower leg   . Pulmonary nodules    s. 08/2015 CT: stable lung nodules.  . Tremor, essential 08/30/2017  . Type II diabetes mellitus [facility])     Patient Active Problem List   Diagnosis Date Noted  . Tremor, essential 08/30/2017  . Lumbar  degenerative disc disease 02/21/2017  . Spondylosis without myelopathy or radiculopathy, lumbar region 02/21/2017  . Iron deficiency anemia 10/13/2016  . Venous ulcer of right leg ([place]) 03/16/2016  . Type II diabetes mellitus ([place])   . Ascending aortic aneurysm ([place])   . Primary osteoarthritis of both hands 01/23/2016  . Varicose veins of right lower extremity with complications 08/65/7846  . Open wound of lower leg 08/26/2015  . Venous ulcer ([place]) 08/26/2015  . Lymphedema of leg 07/23/2015  . Varicose veins of lower extremities with ulcer and inflammation ([place]) 07/23/2015  . Chronic venous insufficiency 07/23/2015  . Leg ulcer ([place]) 07/16/2015  . Ulcer of right lower leg ([place]) 07/12/2015  . Cellulitis of right leg 07/12/2015  . Hyponatremia 07/12/2015  . Chronic diastolic heart failure ([place]) 07/12/2015  . Right hip pain 01/07/2015  . Primary osteoarthritis of right hip 01/07/2015  . Hip contracture 01/07/2015  . AI (aortic incompetence) 08/07/2014  . Diastolic murmur 96/29/5284  . Asthma, mild intermittent 04/04/2013  . Leg varices 03/09/2013  . Asthma, cough variant 10/04/2012  . Eczema intertrigo 05/11/2012  . Basal cell papilloma 05/11/2012  . Carcinoid tumor 10/20/2011  . Allergic rhinitis 07/23/2011  . Airway hyperreactivity 07/23/2011  . Chronic cough 07/23/2011  . Diabetes mellitus type 2 with peripheral artery disease ([place]) 06/19/2009  .  HYPERCHOLESTEROLEMIA 06/19/2009  . HYPERLIPIDEMIA 06/19/2009  . Essential hypertension 06/19/2009  . Coronary atherosclerosis 06/19/2009  . Aortic valve regurgitation 06/19/2009  . AORTIC ANEURYSM 06/19/2009  . BRONCHITIS 06/19/2009  . COPD (chronic obstructive pulmonary disease) ([place]) 06/19/2009  . GERD 06/19/2009  . EDEMA 06/19/2009  . 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 bronch with endobronchial biopies  09/15/2009  . COLONOSCOPY N/A 09/13/2013   Procedure:  COLONOSCOPY;  Surgeon: Jeryl Columbia, MD;  Location: [facility] END[facility];  Service: Endoscopy;  Laterality: N/A;  . endobronchial excision of right upper lobe tumor with laser bronchi  10/06/2009  . fiberoptic bronch with endobronchial u/s  07/02/2010  . PARS PLANA VITRECTOMY     right eye  . PARS PLANA VITRECTOMY W/ ENDOPHOTOCOAGULATION     right eye  . posterior capsulectomy     right eye  . VIDEO BRONCHOSCOPY  12/09/2009     OB History    Gravida  2   Para  1   Term  1   Preterm      AB  1   Living  1     SAB  1   TAB      Ectopic      Multiple      Live Births  1            Home Medications    Prior to Admission medications   Medication Sig Start Date End Date Taking? Authorizing Provider  albuterol (PROVENTIL HFA;VENTOLIN HFA) 108 (90 Base) MCG/ACT inhaler Inhale 1-2 puffs into the lungs every 6 (six) hours as needed for wheezing or shortness of breath. 12/02/16   Long, Wonda Olds, MD  amLODipine (NORVASC) 10 MG tablet TAKE 1 TABLET (10 MG TOTAL) BY MOUTH DAILY. 08/24/17   Josue Hector, MD  aspirin EC 81 MG tablet Take 81 mg by mouth every evening. 7 pm    [provider]  atorvastatin (LIPITOR) 10 MG tablet Take 10 mg by mouth at bedtime. 06/17/15   [provider]  ciclopirox (PENLAC) 8 % solution Apply topically at bedtime. Apply over nail and surrounding skin. Apply daily over previous coat. After seven (7) days, may remove with alcohol and continue cycle. 02/03/18   Trula Slade, DPM  DULoxetine (CYMBALTA) 30 MG capsule Take 1 capsule (30 mg total) by mouth daily. 01/24/18 01/24/19  Arfeen, Arlyce [name], MD  FeFum-FePoly-FA-B Cmp-C-Biot (INTEGRA PLUS) CAPS TAKE ONE CAPSULE BY MOUTH EVERY DAY *NOT COVERED* 08/30/17   Curt Bears, MD  fluticasone (VERAMYST) 27.5 MCG/SPRAY nasal spray Place 2 sprays into the nose daily.    [provider]  furosemide (LASIX) 20 MG tablet TAKE 1 TABLET (20 MG TOTAL) BY MOUTH DAILY AS NEEDED FOR FLUID OR  EDEMA. 10/15/16   Josue Hector, MD  glimepiride (AMARYL) 1 MG tablet Take 1 mg by mouth daily.     [provider]  hydrALAZINE (APRESOLINE) 50 MG tablet Take 1 tablet (50 mg total) by mouth 3 (three) times daily. 10/07/17   Josue Hector, MD  montelukast (SINGULAIR) 10 MG tablet Take 10 mg by mouth at bedtime. 06/04/15   [provider]  Multiple Vitamin (MULITIVITAMIN WITH MINERALS) TABS Take 1 tablet by mouth daily.    [provider]  nitroGLYCERIN (NITROSTAT) 0.4 MG SL tablet PLACE 1 TABLET UNDER THE TONGUE EVERY 5 MINUTES AS NEEDED FOR CHEST PAIN 09/14/16   Josue Hector, MD  nystatin cream (MYCOSTATIN) Apply to affected area 2 times daily 03/21/17   Drenda Freeze, MD  olmesartan (BENICAR) 40 MG tablet TAKE 1 TABLET (40 MG TOTAL) BY MOUTH 2 (TWO) TIMES DAILY. 11/18/17   Josue Hector, MD  Omega-3 Fatty Acids (FISH OIL) 1200 MG CAPS Take 1 capsule by mouth daily.    [provider]  omeprazole (PRILOSEC) 40 MG capsule Take 40 mg by mouth daily.    [provider]  oxybutynin (DITROPAN-XL) 10 MG 24 hr tablet Take 10 mg daily by mouth. 08/19/17   [provider]  pioglitazone (ACTOS) 15 MG tablet Take 7.5 mg by mouth daily.     [provider]  potassium chloride (K-DUR,KLOR-CON) 10 MEQ tablet Take 20 mEq by mouth daily.    [provider]  primidone (MYSOLINE) 50 MG tablet Take 1 tablet (50 mg total) by mouth at bedtime. 12/19/17   Kathrynn Ducking, MD  Probiotic Product (PROBIOTIC PO) Take 1 tablet by mouth daily.    [provider]  Spacer/Aero-Holding Chambers (AEROCHAMBER MINI CHAMBER) DEVI use as directed with your inhalers  04/27/11   [provider]  traMADol (ULTRAM) 50 MG tablet Take 2 tablets (100 mg total) by mouth every 8 (eight) hours as needed. 08/15/17   Kirsteins, Luanna Salk, MD  triamcinolone cream (KENALOG) 0.1 % Apply 1 application topically 2 (two) times daily. 03/18/17   Barnet Glasgow, NP    Family History Family History  Problem Relation Age of Onset  . Hypertension Mother   . Hypertension Father   . Heart attack Neg Hx   . Stroke Neg Hx     Social History Social History   Tobacco Use  . Smoking status: Never Smoker  . Smokeless tobacco: Never Used  Substance Use Topics  . Alcohol use: No  . Drug use: No     Allergies   Patient has no known allergies.   Review of Systems Review of Systems  Constitutional: Negative for chills, diaphoresis, fatigue and fever.  HENT: Positive for congestion and rhinorrhea.   Eyes: Negative for visual disturbance.  Respiratory: Positive for cough, chest tightness and wheezing. Negative for shortness of breath and stridor.   Cardiovascular: Negative for chest pain, palpitations and leg swelling.  Gastrointestinal: Negative for abdominal pain, constipation, diarrhea, nausea and vomiting.  Genitourinary: Negative for dysuria.  Musculoskeletal: Negative for back pain, neck pain and neck stiffness.  Skin: Negative for rash and wound.  Neurological: Negative for light-headedness and headaches.  Psychiatric/Behavioral: Negative for agitation.     Physical Exam Updated Vital Signs BP 113/60 (BP Location: Left Arm)   Pulse 90   Temp 98.6 F (37 C)   Resp 18   Ht 5' (1.524 m)   Wt 71.2 kg (157 lb)   SpO2 95%   BMI 30.66 kg/m   Physical Exam  Constitutional: She is oriented to person, place, and time. She appears well-developed and well-nourished. No distress.  HENT:  Head: Normocephalic and atraumatic.  Nose: Rhinorrhea present.  Mouth/Throat: Oropharynx is clear and moist. No oropharyngeal exudate.  Eyes: Pupils are equal, round, and reactive to light. Conjunctivae and EOM are normal.  Neck: Neck supple.  Cardiovascular: Normal rate and regular rhythm.  Murmur heard. Pulmonary/Chest: Effort normal. No respiratory distress. She has wheezes. She has no rales. She exhibits no tenderness.  Abdominal:  Soft. There is no tenderness.  Musculoskeletal: She exhibits no edema or tenderness.  Neurological: She is alert and oriented to  person, place, and time. No sensory deficit. She exhibits normal muscle tone.  Skin: Skin is warm and dry. Capillary refill takes less than 2 seconds. No rash noted. She is not diaphoretic.  Psychiatric: She has a normal mood and affect.  Nursing note and vitals reviewed.    ED Treatments / Results  Labs (all labs ordered are listed, but only abnormal results are displayed) Labs Reviewed  BASIC METABOLIC PANEL - Abnormal; Notable for the following components:      Result Value   Sodium 133 (*)    Glucose, Bld 222 (*)    Calcium 8.8 (*)    All other components within normal limits  BRAIN NATRIURETIC PEPTIDE - Abnormal; Notable for the following components:   B Natriuretic Peptide 313.0 (*)    All other components within normal limits  CBC  I-STAT TROPONIN, ED    EKG EKG Interpretation  Date/Time:  Saturday February 11 2018 11:06:41 EDT Ventricular Rate:  98 PR Interval:    QRS Duration: 90 QT Interval:  340 QTC Calculation: 434 R Axis:   -57 Text Interpretation:  Sinus rhythm with 1st degree A-V block with Premature supraventricular complexes Left anterior fascicular block Left ventricular hypertrophy Possible Lateral infarct , age undetermined Abnormal ECG When compared to prior, no significnat changes seen.  No STEMI Confirmed by Antony Blackbird [phone]) on 02/11/2018 12:23:13 PM   Radiology Dg Chest 2 View  Result Date: 02/11/2018 CLINICAL DATA:  Cough for 3 days. EXAM: CHEST - 2 VIEW COMPARISON:  CT chest 04/13/2017 and chest radiograph 12/02/2016. FINDINGS: Patient is rotated. Heart is enlarged. Thoracic aorta is calcified. Very mild interstitial prominence appears chronic. Blunting of the left costophrenic angle is also likely chronic. No airspace consolidation. Degenerative changes throughout the spine. IMPRESSION: 1. No acute findings. 2.  Aortic  atherosclerosis (ICD10-170.0). Electronically Signed   By: Lorin Picket M.D.   On: 02/11/2018 11:37    Procedures Procedures (including critical care time)  Medications Ordered in ED Medications  ipratropium-albuterol (DUONEB) 0.5-2.5 (3) MG/3ML nebulizer solution 3 mL (3 mLs Nebulization Given 02/11/18 1308)     Initial Impression / Assessment and Plan / ED Course  I have reviewed the triage vital signs and the nursing notes.  Pertinent labs & imaging results that were available during my care of the patient were reviewed by me and considered in my medical decision making (see chart for details).     [name] is a 84 y.o. female with a past medical history significant for hypertension, hyperlipidemia, diabetes, CAD, CHF, a sending aortic aneurysm and COPD who presents with 3 days of cough.  Patient reports that her husband was admitted for pneumonia and sepsis several days ago and she is concerned she is developed a cough and wanted to be evaluated.  She denies significant shortness of breath but does report a yellow productive sputum.  She denies fevers, chills, chest pain, nausea, vomiting, confusion, diarrhea, dysuria.  She denies significant shortness of breath but does report some rhinorrhea and congestion.  She reports that she is been taking her home medications including an inhaler that has helped somewhat.  My exam, patient does have mild crackles in the bases of her lungs.  Patient also has a very faint wheezing.  Patient had unchanged edema in her legs per her report.  Patient had pulses in all extremities.  Chest is nontender and back was nontender.    EKG was performed showing no evidence of STEMI.  Chest x-ray  was performed in triage showing no evidence of pneumonia.  Patient did have some similar interstitial prominence that was likely chronic.  No consolidation seen.  BNP was performed due to the crackles and it appears slightly elevated from prior.  Other laboratory  testing was overall reassuring with a normal white count, normal hemoglobin, and troponin negative.    Patient was given a breathing treatment due to the mild wheezing.    Based on reassuring work-up, dyspnea patient will be stable for discharge if her breathing is improved.   Patient's mild wheezing is completely resolved.  Patient is breathing better.  Suspect combination of viral URI with the congestion rhinorrhea and cough, mild COPD exacerbation in the setting of allergies and likely URI.  Do not feel patient needs antibiotics.  I feel patient is safe for discharge home given her demonstrated stability.  BNP slightly more elevated than prior but I do not feel patient needs admission for  CHF exacerbation.  Patient reports she will take her diuretic at home.  Patient and family understood plan of care as well as return precautions.  Patient had no other questions or concerns and was discharged in good condition.   Final Clinical Impressions(s) / ED Diagnoses   Final diagnoses:  Cough    ED Discharge Orders        Ordered    benzonatate (TESSALON) 100 MG capsule  Every 8 hours     02/11/18 1615      Clinical Impression: 1. Cough     Disposition: Discharge  Condition: Good  I have discussed the results, Dx and Tx plan with the pt(& family if present). He/she/they expressed understanding and agree(s) with the plan. Discharge instructions discussed at great length. Strict return precautions discussed and pt &/or family have verbalized understanding of the instructions. No further questions at time of discharge.    New Prescriptions   BENZONATATE (TESSALON) 100 MG CAPSULE    Take 1 capsule (100 mg total) by mouth every 8 (eight) hours.    Follow Up: [place] [address] [phone] Schedule an appointment as soon as possible for a visit    [facility] [address] [phone] mc [place]       Delva Derden, Gwenyth Allegra, MD 02/11/18 2351

## 2018-02-11 NOTE — Discharge Instructions (Signed)
Your imaging and work-up today did not show evidence of pneumonia.  We suspect you have a combination of viral upper respiratory infection, allergies, and slight worsening of her COPD.  Her oxygen saturations remain normal after a breathing treatment.  We feel you are safe for discharge home.  Please use the cough medicine and follow-up with your primary doctor in several days.  Please use your breathing treatments at home.  If any symptoms change or worsen, please return to the nearest emergency department.

## 2018-02-11 NOTE — ED Triage Notes (Signed)
Pt c/o cough X2-3 days, denies SOB.

## 2018-02-12 ENCOUNTER — Other Ambulatory Visit: Payer: Self-pay | Admitting: Physical Medicine & Rehabilitation

## 2018-03-01 ENCOUNTER — Other Ambulatory Visit: Payer: Self-pay | Admitting: Cardiovascular Disease

## 2018-03-10 DIAGNOSIS — E119 Type 2 diabetes mellitus without complications: Secondary | ICD-10-CM | POA: Diagnosis not present

## 2018-03-14 DIAGNOSIS — M25552 Pain in left hip: Secondary | ICD-10-CM | POA: Diagnosis not present

## 2018-03-14 DIAGNOSIS — M1990 Unspecified osteoarthritis, unspecified site: Secondary | ICD-10-CM | POA: Diagnosis not present

## 2018-03-14 DIAGNOSIS — I38 Endocarditis, valve unspecified: Secondary | ICD-10-CM | POA: Diagnosis not present

## 2018-03-14 DIAGNOSIS — R0609 Other forms of dyspnea: Secondary | ICD-10-CM | POA: Diagnosis not present

## 2018-03-14 DIAGNOSIS — I351 Nonrheumatic aortic (valve) insufficiency: Secondary | ICD-10-CM | POA: Diagnosis not present

## 2018-03-14 DIAGNOSIS — I10 Essential (primary) hypertension: Secondary | ICD-10-CM | POA: Diagnosis not present

## 2018-03-14 DIAGNOSIS — Z79899 Other long term (current) drug therapy: Secondary | ICD-10-CM | POA: Diagnosis not present

## 2018-03-14 DIAGNOSIS — G8929 Other chronic pain: Secondary | ICD-10-CM | POA: Diagnosis not present

## 2018-03-17 ENCOUNTER — Other Ambulatory Visit: Payer: Self-pay | Admitting: Internal Medicine

## 2018-03-17 DIAGNOSIS — C7A090 Malignant carcinoid tumor of the bronchus and lung: Secondary | ICD-10-CM

## 2018-03-20 ENCOUNTER — Other Ambulatory Visit: Payer: Self-pay | Admitting: Neurology

## 2018-03-23 ENCOUNTER — Other Ambulatory Visit: Payer: Self-pay | Admitting: Thoracic Surgery (Cardiothoracic Vascular Surgery)

## 2018-03-23 DIAGNOSIS — I712 Thoracic aortic aneurysm, without rupture, unspecified: Secondary | ICD-10-CM

## 2018-03-26 ENCOUNTER — Other Ambulatory Visit: Payer: Self-pay | Admitting: Cardiovascular Disease

## 2018-03-26 DIAGNOSIS — I059 Rheumatic mitral valve disease, unspecified: Secondary | ICD-10-CM

## 2018-03-27 ENCOUNTER — Encounter: Payer: Self-pay | Admitting: Physical Medicine & Rehabilitation

## 2018-03-27 ENCOUNTER — Ambulatory Visit (HOSPITAL_BASED_OUTPATIENT_CLINIC_OR_DEPARTMENT_OTHER): Payer: Medicare Other | Admitting: Physical Medicine & Rehabilitation

## 2018-03-27 ENCOUNTER — Encounter: Payer: Medicare Other | Attending: Physical Medicine & Rehabilitation

## 2018-03-27 VITALS — BP 161/79 | HR 81 | Resp 16 | Ht 60.0 in | Wt 151.0 lb

## 2018-03-27 DIAGNOSIS — E119 Type 2 diabetes mellitus without complications: Secondary | ICD-10-CM | POA: Insufficient documentation

## 2018-03-27 DIAGNOSIS — G8929 Other chronic pain: Secondary | ICD-10-CM | POA: Diagnosis not present

## 2018-03-27 DIAGNOSIS — I2510 Atherosclerotic heart disease of native coronary artery without angina pectoris: Secondary | ICD-10-CM | POA: Diagnosis not present

## 2018-03-27 DIAGNOSIS — M1611 Unilateral primary osteoarthritis, right hip: Secondary | ICD-10-CM | POA: Diagnosis not present

## 2018-03-27 DIAGNOSIS — K219 Gastro-esophageal reflux disease without esophagitis: Secondary | ICD-10-CM | POA: Diagnosis not present

## 2018-03-27 DIAGNOSIS — I10 Essential (primary) hypertension: Secondary | ICD-10-CM | POA: Diagnosis not present

## 2018-03-27 DIAGNOSIS — I712 Thoracic aortic aneurysm, without rupture: Secondary | ICD-10-CM | POA: Diagnosis not present

## 2018-03-27 DIAGNOSIS — M24551 Contracture, right hip: Secondary | ICD-10-CM | POA: Diagnosis not present

## 2018-03-27 DIAGNOSIS — Z8249 Family history of ischemic heart disease and other diseases of the circulatory system: Secondary | ICD-10-CM | POA: Insufficient documentation

## 2018-03-27 DIAGNOSIS — J449 Chronic obstructive pulmonary disease, unspecified: Secondary | ICD-10-CM | POA: Diagnosis not present

## 2018-03-27 DIAGNOSIS — G250 Essential tremor: Secondary | ICD-10-CM | POA: Diagnosis not present

## 2018-03-27 MED ORDER — BUPRENORPHINE 5 MCG/HR TD PTWK: 5.0000 ug | MEDICATED_PATCH | TRANSDERMAL | 2 refills | Status: DC

## 2018-03-27 NOTE — Progress Notes (Signed)
Subjective:    Patient ID: [name], female    DOB: [birth year], 84 y.o.   MRN: [id number]  HPI 84 year old 84 female with chronic right hip pain secondary to severe end-stage osteoarthritis.  She has been evaluated by orthopedic surgery but was not felt to be a good operative candidate due to multiple medical comorbidities.  She does get partial relief with tramadol 100 mg 3 times daily however her daughter states that the patient is becoming less active and is not walking with her walker as much as she once did. She did go through some outpatient therapy about 6 months ago. She has had no side effects with the tramadol.  No falls.  Pain Inventory Average Pain 4 Pain Right Now 4 My pain is constant  In the last 24 hours, has pain interfered with the following? General activity 0 Relation with others 0 Enjoyment of life 0 What TIME of day is your pain at its worst? varies Sleep (in general) Fair  Pain is worse with: walking, bending and standing Pain improves with: medication Relief from Meds: n/a  Mobility walk with assistance use a walker  Function retired  Neuro/Psych trouble walking  Prior Studies Any changes since last visit?  no  Physicians involved in your care Any changes since last visit?  no   Family History  Problem Relation Age of Onset  . Hypertension Mother   . Hypertension Father   . Heart attack Neg Hx   . Stroke Neg Hx    Social History   Socioeconomic History  . Marital status: Married    Spouse name: Not on file  . Number of children: 1  . Years of education: 8  . Highest education level: Not on file  Occupational History  . Not on file  Social Needs  . Financial resource strain: Not on file  . Food insecurity:    Worry: Not on file    Inability: Not on file  . Transportation needs:    Medical: Not on file    Non-medical: Not on file  Tobacco Use  . Smoking status: Never Smoker  . Smokeless tobacco: Never Used   Substance and Sexual Activity  . Alcohol use: No  . Drug use: No  . Sexual activity: Not Currently  Lifestyle  . Physical activity:    Days per week: Not on file    Minutes per session: Not on file  . Stress: Not on file  Relationships  . Social connections:    Talks on phone: Not on file    Gets together: Not on file    Attends religious service: Not on file    Active member of club or organization: Not on file    Attends meetings of clubs or organizations: Not on file    Relationship status: Not on file  Other Topics Concern  . Not on file  Social History Narrative   Lives with family (husband and daughter)    Caffeine use: Coffee/tea sometimes   Right handed    Past Surgical History:  Procedure Laterality Date  . bronch with endobronchial biopies  09/15/2009  . COLONOSCOPY N/A 09/13/2013   Procedure: COLONOSCOPY;  Surgeon: Jeryl Columbia, MD;  Location: [facility];  Service: Endoscopy;  Laterality: N/A;  . endobronchial excision of right upper lobe tumor with laser bronchi  10/06/2009  . fiberoptic bronch with endobronchial u/s  07/02/2010  . PARS PLANA VITRECTOMY     right eye  . PARS PLANA  VITRECTOMY W/ ENDOPHOTOCOAGULATION     right eye  . posterior capsulectomy     right eye  . VIDEO BRONCHOSCOPY  12/09/2009   Past Medical History:  Diagnosis Date  . Ascending aortic aneurysm ([place])    a. 08/2015 stable 5 cm Asc Ao Aneurysm.  . Bronchitis   . Carcinoid tumor   . COPD (chronic obstructive pulmonary disease) ([place])   . Coronary artery disease    a. Ca2+ noted on CT - no other clear documentation of CAD.  Marland Kitchen Difficulty in walking(719.7)   . Disturbance of skin sensation   . Edema   . Essential hypertension   . GERD (gastroesophageal reflux disease)   . Hypercholesterolemia   . Hyperlipidemia   . Mitral valve disease    a. 10/2015 Echo: Mod MS, mild MR.  . Moderate aortic insufficiency    a. 10/2015 Echo: EF 50-55%, no rwma, mod AI, mod MS, mild MR, sev dil  LA, mild TR/PR, PASP 20mmHg.  . Pain in joint, forearm   . Pain in joint, hand   . Pain in joint, lower leg   . Pulmonary nodules    s. 08/2015 CT: stable lung nodules.  . Tremor, essential 08/30/2017  . Type II diabetes mellitus ([facility])    BP (!) 161/79   Pulse 81   Resp 16   Ht 5' (1.524 m)   Wt 151 lb (68.5 kg)   SpO2 92%   BMI 29.49 kg/m   Opioid Risk Score:   Fall Risk Score:  `1  Depression screen PHQ 2/9  Depression screen [facility] 2/9 07/19/2016 01/23/2016 01/07/2015  Decreased Interest 3 3 0  Down, Depressed, Hopeless 0 0 0  PHQ - 2 Score 3 3 0  Altered sleeping - 3 3  Tired, decreased energy - 1 2  Change in appetite - 0 0  Feeling bad or failure about yourself  - 0 3  Trouble concentrating - 0 0  Moving slowly or fidgety/restless - 0 3  Suicidal thoughts - 0 0  PHQ-9 Score - 7 11  Difficult doing work/chores - Not difficult at all -  Some recent data might be hidden    Review of Systems  Constitutional: Negative.   HENT: Negative.   Eyes: Negative.   Respiratory: Negative.   Cardiovascular: Negative.   Gastrointestinal: Negative.   Endocrine: Negative.   Genitourinary: Negative.   Musculoskeletal: Positive for gait problem.  Skin: Negative.   Allergic/Immunologic: Negative.   Hematological: Negative.   Psychiatric/Behavioral: Negative.   All other systems reviewed and are negative.      Objective:   Physical Exam  Constitutional: She appears well-developed and well-nourished.  HENT:  Head: Normocephalic and atraumatic.  Musculoskeletal:       Right shoulder: She exhibits decreased range of motion.  Right hip range of motion reduced, 0 degrees of internal and external rotation.  Patient has pain with hip internal and external rotation. There is a mild flexion deformity of the right hip.  Neurological: Gait abnormal.  5/5 strength bilateral hip flexor knee extensor ankle dorsiflexor. Negative straight leg raising Ambulates with rolling walker forward  flexed gait  Skin: Skin is warm and dry.  Psychiatric: She has a normal mood and affect.  Nursing note and vitals reviewed.         Assessment & Plan:  #1.  End-stage osteoarthritis right hip chronic pain.  She has not had any significant relief with intra-articular injection.  She is not a good surgical  candidate for total joint replacement. She is currently on tramadol with a total dose of 30 mg equivalents of morphine per day. While she has had no adverse effects from this medication she still is declining in her function in part related to pain control. As discussed with patient and her daughter would like to trial buprenorphine patch 5 mcg/h this is approximately 120 mcg/day.  This may be slightly underdosing her compared to where she is now but can go up to 10 mcg if needed.  Patient's daughter will call to report whether this represents an improvement over her current regimen of tramadol 100 g 3 times daily.  She will be seen in 3 months for follow-up, nurse practitioner

## 2018-03-27 NOTE — Patient Instructions (Signed)
Buprenorphine transdermal patch What is this medicine? BUPRENORPHINE (byoo pre NOR feen) is a pain reliever. It is used to treat moderate to severe pain. This medicine may be used for other purposes; ask your health care provider or pharmacist if you have questions. COMMON BRAND NAME(S): Butrans What should I tell my health care provider before I take this medicine? They need to know if you have any of these conditions: -blockage in your bowel -brain tumor -drink more than 3 alcohol-containing drinks per day -drug abuse or addiction -fever -head injury -kidney disease -liver disease -lung or breathing disease, like asthma -thyroid disease -trouble passing urine or change in the amount of urine -an unusual or allergic reaction to buprenorphine, other medicines, foods, dyes, or preservatives -pregnant or trying to get pregnant -breast-feeding How should I use this medicine? Apply the patch to your skin. Do not cut or damage the patch. A cut or damaged patch can be very dangerous because you may get too much medicine. Select a clean, dry area of skin on your upper outer arm, upper chest, upper back, or the side of the chest. Do not apply the patch to broken, burned, cut, or irritated skin. Use only water to clean the area. Do not use soap or alcohol to clean the skin because this can increase the effects of the medicine. If the area is hairy, clip the hair with scissors, but do not shave. Take the patch out of its wrapper. Bend the patch along the faint line and slowly peel the outer portion of the liner, which covers the sticky surface of the patch. Press the patch onto the skin and slowly peel off the protective liner. Do not use a patch if the packaging or backing is damaged. Do not touch the sticky part with your fingers. Press the patch to the skin using the palm of your hand. Press the patch to the skin for 15 seconds. Wash your hands at once. Keep patches far away from children. Do not  let children see you apply the patch and do not apply it where children can see it. Do not call the patch a sticker, tattoo, or bandage, as this could encourage the child to mimic your actions. Used patches still contain medicine. Children or pets can have serious side effects or die from putting used patches in their mouth or on their bodies. Take off the old patch before putting on a new patch. Apply each new patch to a different area of skin. If a patch comes off or causes irritation, remove it and apply a new patch to a different site. If the edges of the patch start to loosen, first apply first aid tape to the edges of the patch. If problems with the patch not sticking continue, cover the patch with a see-through adhesive dressing (like Bioclusive or Tegaderm). Never cover the patch with any other bandage or tape. To get rid of used patches, fold the patch in half with the sticky sides together. Then, flush it down the toilet. Alternately, you may dispose of the patch in the Patch-Disposal Unit provided. Never throw the patch away in the trash without sealing it in the Patch-Disposal unit. Replace the patch every 7 days. Follow the directions on the prescription label. Do not use more medicine than you are told to use. A special MedGuide will be given to you by the pharmacist with each prescription and refill. Be sure to read this information carefully each time. Talk to your pediatrician regarding  the use of this medicine in children. Special care may be needed. If a patch accidentally touches the skin, use only water to clean the area. Do not use soap or alcohol to clean the skin because this can increase the effects of the medicine. If someone accidentally uses a buprenorphine patch and is not awake and alert, immediately call 911 for help. If the person is awake and alert, call a doctor, health care professional, or the [employer]. Overdosage: If you think you have taken too much of this  medicine contact a poison control center or emergency room at once. NOTE: This medicine is only for you. Do not share this medicine with others. What if I miss a dose? If you forget to replace your patch, take off the old patch and put on a new patch as soon as you can. Do not apply an extra patch to your skin. Do not wear more than one patch at the same time unless told to do so by your doctor or health care professional. What may interact with this medicine? Do not take this medication with any of the following medicines: -cisapride -certain medicines for fungal infections like ketoconazole and itraconazole -dofetilide -dronedarone -pimozide -ritonavir -thioridazine -ziprasidone This medicine may interact with the following medications: -alcohol -antihistamines for allergy, cough and cold -antiviral medicines for HIV or AIDS -atropine -certain antibiotics like clarithromycin, erythromycin, linezold, rifampin -certain medicines for anxiety or sleep -certain medicines for bladder problems like oxybutynin, tolterodine -certain medicines for depression like amitriptyline, fluoxetine, sertraline -certain medicines for migraine headache like almotriptan, eletriptan, frovatriptan, naratriptan, rizatriptan, sumatriptan, zolmitriptan -certain medicines for nausea or vomiting like dolasetron, ondansetron, palonosetron -certain medicines for Parkinson's disease like benztropine, trihexyphenidyl -certain medicines for seizures like phenobarbital, primidone -certain medicines for stomach problems like cimetidine, dicyclomine, hyoscyamine -certain medicines for travel sickness like scopolamine -diuretics -general anesthetics like halothane, isoflurane, methoxyflurane, propofol -ipratropium -local anesthetics like lidocaine, pramoxine, tetracaine -MAOIs like Carbex, Eldepryl, Marplan, Nardil, and Parnate -medicines that relax muscles for surgery -methylene blue -other medicines that prolong  the QT interval (cause an abnormal heart rhythm) -other narcotic medicines for pain or cough -phenothiazines like chlorpromazine, mesoridazine, prochlorperazine, thioridazine This list may not describe all possible interactions. Give your health care provider a list of all the medicines, herbs, non-prescription drugs, or dietary supplements you use. Also tell them if you smoke, drink alcohol, or use illegal drugs. Some items may interact with your medicine. What should I watch for while using this medicine? Other pain medicine may be needed when you first start using the patch because the patch can take some time to start working. Tell your doctor or health care professional if your pain does not go away, if it gets worse, or if you have new or a different type of pain. You may develop tolerance to the medicine. Tolerance means that you will need a higher dose of the medicine for pain relief. Tolerance is normal and is expected if you take the medicine for a long time. Do not suddenly stop taking your medicine because you may develop a severe reaction. Your body becomes used to the medicine. This does NOT mean you are addicted. Addiction is a behavior related to getting and using a drug for a non-medical reason. If you have pain, you have a medical reason to take pain medicine. Your doctor will tell you how much medicine to take. If your doctor wants you to stop the medicine, the dose will be slowly lowered over  time to avoid any side effects. If you are also taking a narcotic medicine for pain or cough or another medicine that also causes drowsiness, you may have more side effects. Give your health care provider a list of all medicines you use. Your doctor will tell you how much medicine to take. Do not take more medicine than directed. Call emergency for help if you have problems breathing or unusual sleepiness. You may get drowsy or dizzy. Do not drive, use machinery, or do anything that needs mental  alertness until you know how this medicine affects you. Do not stand or sit up quickly, especially if you are an older patient. This reduces the risk of dizzy or fainting spells. Alcohol may interfere with the effect of this medicine. Avoid alcoholic drinks. The medicine will cause constipation. Try to have a bowel movement at least every 2 to 3 days. If you do not have a bowel movement for 3 days, call your doctor or health care professional. Your mouth may get dry. Chewing sugarless gum or sucking hard candy, and drinking plenty of water may help. Contact your doctor if the problem does not go away or is severe. This medicine patch is sensitive to certain body heat changes. If your skin gets too hot, more medicine will come out of the patch and can cause a deadly overdose. Call your healthcare provider if you get a fever. Do not take hot baths. Do not sunbathe. Do not use hot tubs, saunas, hair dryers, heating pads, electric blankets, heated waterbeds, or tanning lamps. Do not do exercise that increases your body temperature. What side effects may I notice from receiving this medicine? Side effects that you should report to your doctor or health care professional as soon as possible: -allergic reactions like skin rash, itching or hives, swelling of the face, lips, or tongue -breathing problems -confusion -signs and symptoms of a dangerous change in heartbeat or heart rhythm like chest pain; dizziness; fast or irregular heartbeat; palpitations; feeling faint or lightheaded, falls; breathing problems -signs and symptoms of liver injury like dark yellow or brown urine; general ill feeling or flu-like symptoms; light-colored stools; loss of appetite; nausea; right upper belly pain; unusually weak or tired; yellow of the eyes or skin -signs and symptoms of low blood pressure like dizziness; feeling faint or lightheaded, falls; unusually weak or tired -trouble passing urine or change in the amount of  urine Side effects that usually do not require medical attention (report to your doctor or health care professional if they continue or are bothersome): -constipation -dry mouth -itching, redness, or rash at the patch site -nausea, vomiting -tiredness This list may not describe all possible side effects. Call your doctor for medical advice about side effects. You may report side effects to FDA at 1-800-FDA-1088. Where should I keep my medicine? Keep out of the reach of children. This medicine can be abused. Keep your medicine in a safe place to protect it from theft. Do not share this medicine with anyone. Selling or giving away this medicine is dangerous and against the law. Store at room temperature between 59 and 86 degrees F (15 and 30 degrees C). Do not store the patches out of their wrappers. This medicine may cause accidental overdose and death if it is taken by other adults, children, or pets. Flush any unused medicine down the toilet as instructed above to reduce the chance of harm. Alternately, you may dispose of the patch in the Patch-Disposal Unit provided. Do not  use the medicine after the expiration date. NOTE: This sheet is a summary. It may not cover all possible information. If you have questions about this medicine, talk to your doctor, pharmacist, or health care provider.  2018 Elsevier/Gold Standard (2016-05-26 14:17:49)

## 2018-03-29 ENCOUNTER — Other Ambulatory Visit: Payer: Self-pay | Admitting: Obstetrics & Gynecology

## 2018-03-29 DIAGNOSIS — Z1231 Encounter for screening mammogram for malignant neoplasm of breast: Secondary | ICD-10-CM

## 2018-03-31 DIAGNOSIS — I351 Nonrheumatic aortic (valve) insufficiency: Secondary | ICD-10-CM | POA: Diagnosis not present

## 2018-03-31 DIAGNOSIS — I38 Endocarditis, valve unspecified: Secondary | ICD-10-CM | POA: Diagnosis not present

## 2018-04-01 ENCOUNTER — Other Ambulatory Visit: Payer: Self-pay | Admitting: Cardiovascular Disease

## 2018-04-04 ENCOUNTER — Telehealth: Payer: Self-pay

## 2018-04-04 DIAGNOSIS — E1159 Type 2 diabetes mellitus with other circulatory complications: Secondary | ICD-10-CM | POA: Diagnosis not present

## 2018-04-04 DIAGNOSIS — E7800 Pure hypercholesterolemia, unspecified: Secondary | ICD-10-CM | POA: Diagnosis not present

## 2018-04-04 NOTE — Telephone Encounter (Signed)
PA submitted for Buprenorphine 71mcg to pt insurance.

## 2018-04-05 ENCOUNTER — Telehealth: Payer: Self-pay

## 2018-04-05 NOTE — Telephone Encounter (Signed)
Buprenorphine approved 10/23/17 through 10/24/18

## 2018-04-11 DIAGNOSIS — R600 Localized edema: Secondary | ICD-10-CM | POA: Diagnosis not present

## 2018-04-11 DIAGNOSIS — I350 Nonrheumatic aortic (valve) stenosis: Secondary | ICD-10-CM | POA: Diagnosis not present

## 2018-04-11 DIAGNOSIS — Z7982 Long term (current) use of aspirin: Secondary | ICD-10-CM | POA: Diagnosis not present

## 2018-04-11 DIAGNOSIS — Z7984 Long term (current) use of oral hypoglycemic drugs: Secondary | ICD-10-CM | POA: Diagnosis not present

## 2018-04-11 DIAGNOSIS — I2510 Atherosclerotic heart disease of native coronary artery without angina pectoris: Secondary | ICD-10-CM | POA: Diagnosis not present

## 2018-04-11 DIAGNOSIS — E1159 Type 2 diabetes mellitus with other circulatory complications: Secondary | ICD-10-CM | POA: Diagnosis not present

## 2018-04-11 DIAGNOSIS — I10 Essential (primary) hypertension: Secondary | ICD-10-CM | POA: Diagnosis not present

## 2018-04-11 DIAGNOSIS — E7800 Pure hypercholesterolemia, unspecified: Secondary | ICD-10-CM | POA: Diagnosis not present

## 2018-04-12 DIAGNOSIS — N819 Female genital prolapse, unspecified: Secondary | ICD-10-CM | POA: Diagnosis not present

## 2018-04-12 DIAGNOSIS — N3941 Urge incontinence: Secondary | ICD-10-CM | POA: Diagnosis not present

## 2018-04-12 DIAGNOSIS — N3281 Overactive bladder: Secondary | ICD-10-CM | POA: Diagnosis not present

## 2018-04-16 ENCOUNTER — Other Ambulatory Visit (HOSPITAL_COMMUNITY): Payer: Self-pay | Admitting: Psychiatry

## 2018-04-16 DIAGNOSIS — F330 Major depressive disorder, recurrent, mild: Secondary | ICD-10-CM

## 2018-04-17 ENCOUNTER — Other Ambulatory Visit: Payer: Self-pay

## 2018-04-18 ENCOUNTER — Ambulatory Visit: Payer: Self-pay | Admitting: Thoracic Surgery (Cardiothoracic Vascular Surgery)

## 2018-04-21 ENCOUNTER — Other Ambulatory Visit (HOSPITAL_COMMUNITY): Payer: Self-pay | Admitting: Psychiatry

## 2018-04-21 ENCOUNTER — Ambulatory Visit
Admission: RE | Admit: 2018-04-21 | Discharge: 2018-04-21 | Disposition: A | Discharge status: Home / Self Care | Payer: Medicare Other | Source: Ambulatory Visit | Attending: Obstetrics & Gynecology | Admitting: Obstetrics & Gynecology

## 2018-04-21 DIAGNOSIS — Z1231 Encounter for screening mammogram for malignant neoplasm of breast: Secondary | ICD-10-CM | POA: Diagnosis not present

## 2018-04-25 ENCOUNTER — Ambulatory Visit (INDEPENDENT_AMBULATORY_CARE_PROVIDER_SITE_OTHER): Payer: Medicare Other | Admitting: Psychiatry

## 2018-04-25 ENCOUNTER — Encounter (HOSPITAL_COMMUNITY): Payer: Self-pay | Admitting: Psychiatry

## 2018-04-25 DIAGNOSIS — F330 Major depressive disorder, recurrent, mild: Secondary | ICD-10-CM

## 2018-04-25 MED ORDER — DULOXETINE HCL 30 MG PO CPEP: 30.0000 mg | ORAL_CAPSULE | Freq: Every day | ORAL | 0 refills | Status: DC

## 2018-04-25 NOTE — Progress Notes (Signed)
[facility] MD/PA/NP OP Progress Note  04/25/2018 2:27 PM [name]  MRN:  [id number]  Chief Complaint: I am taking medication.  There are days I do not sleep very well.  HPI: Patient came for her follow-up appointment with her daughter.  She is a 84 year old 13 married female.  Patient and her daughter refused translation services.  Her daughter usually translate for her.  On her last visit we increase Cymbalta 30 mg.  Daughter endorsed there are times when patient requires reinforcement to take the medication.  Sometimes she forgets but overall when she take the medication her mood is better.  She has more energy.  She has more motivation to do things.  Patient has a lot of somatic complaints including chronic pain, tremors, difficulty walking, numbness and tingling.  Recently she seen neurology and she is taking primidone for tremors.  Her major concern is chronic pain but since Cymbalta increase she has more motivation to do things.  She uses walker for ambulation.  As per daughter patient denies any suicidal thoughts or homicidal thought.  She is not aggressive or having any suicidal thoughts or homicidal thought.  She lives with her husband and daughter.  Her appetite is okay.  She has no concern from Cymbalta.    Visit Diagnosis:    ICD-10-CM   1. Mild episode of recurrent major depressive disorder (HCC) F33.0 DULoxetine (CYMBALTA) 30 MG capsule    Past Psychiatric History: Reviewed Patient seen psychiatrist in [place] more than 20 years ago but do not recall details.  She denies any history of mania, psychosis, hallucination or any suicidal attempts.  Past Medical History:  Past Medical History:  Diagnosis Date  . Ascending aortic aneurysm ([place])    a. 08/2015 stable 5 cm Asc Ao Aneurysm.  . Bronchitis   . Carcinoid tumor   . COPD (chronic obstructive pulmonary disease) ([place])   . Coronary artery disease    a. Ca2+ noted on CT - no other clear documentation of CAD.  Marland Kitchen  Difficulty in walking(719.7)   . Disturbance of skin sensation   . Edema   . Essential hypertension   . GERD (gastroesophageal reflux disease)   . Hypercholesterolemia   . Hyperlipidemia   . Mitral valve disease    a. 10/2015 Echo: Mod MS, mild MR.  . Moderate aortic insufficiency    a. 10/2015 Echo: EF 50-55%, no rwma, mod AI, mod MS, mild MR, sev dil LA, mild TR/PR, PASP 50mmHg.  . Pain in joint, forearm   . Pain in joint, hand   . Pain in joint, lower leg   . Pulmonary nodules    s. 08/2015 CT: stable lung nodules.  . Tremor, essential 08/30/2017  . Type II diabetes mellitus ([place])     Past Surgical History:  Procedure Laterality Date  . bronch with endobronchial biopies  09/15/2009  . COLONOSCOPY N/A 09/13/2013   Procedure: COLONOSCOPY;  Surgeon: Jeryl Columbia, MD;  Location: [facility];  Service: Endoscopy;  Laterality: N/A;  . endobronchial excision of right upper lobe tumor with laser bronchi  10/06/2009  . fiberoptic bronch with endobronchial u/s  07/02/2010  . PARS PLANA VITRECTOMY     right eye  . PARS PLANA VITRECTOMY W/ ENDOPHOTOCOAGULATION     right eye  . posterior capsulectomy     right eye  . VIDEO BRONCHOSCOPY  12/09/2009    Family Psychiatric History: Reviewed  Family History:  Family History  Problem Relation Age of Onset  .  Hypertension Mother   . Hypertension Father   . Heart attack Neg Hx   . Stroke Neg Hx     Social History:  Social History   Socioeconomic History  . Marital status: Married    Spouse name: Not on file  . Number of children: 1  . Years of education: 8  . Highest education level: Not on file  Occupational History  . Not on file  Social Needs  . Financial resource strain: Not on file  . Food insecurity:    Worry: Not on file    Inability: Not on file  . Transportation needs:    Medical: Not on file    Non-medical: Not on file  Tobacco Use  . Smoking status: Never Smoker  . Smokeless tobacco: Never Used  Substance  and Sexual Activity  . Alcohol use: No  . Drug use: No  . Sexual activity: Not Currently  Lifestyle  . Physical activity:    Days per week: Not on file    Minutes per session: Not on file  . Stress: Not on file  Relationships  . Social connections:    Talks on phone: Not on file    Gets together: Not on file    Attends religious service: Not on file    Active member of club or organization: Not on file    Attends meetings of clubs or organizations: Not on file    Relationship status: Not on file  Other Topics Concern  . Not on file  Social History Narrative   Lives with family (husband and daughter)    Caffeine use: Coffee/tea sometimes   Right handed     Allergies: No Known Allergies  Metabolic Disorder Labs: Lab Results  Component Value Date   HGBA1C 7.1 (H) 07/16/2015   MPG 157 07/16/2015   MPG 148 07/13/2015   No results found for: PROLACTIN No results found for: CHOL, TRIG, HDL, CHOLHDL, VLDL, LDLCALC Lab Results  Component Value Date   TSH 1.792 07/13/2015   TSH 0.285 (L) 10/24/2011    Therapeutic Level Labs: No results found for: LITHIUM No results found for: VALPROATE No components found for:  CBMZ  Current Medications: Current Outpatient Medications  Medication Sig Dispense Refill  . albuterol (PROVENTIL HFA;VENTOLIN HFA) 108 (90 Base) MCG/ACT inhaler Inhale 1-2 puffs into the lungs every 6 (six) hours as needed for wheezing or shortness of breath. 1 Inhaler 0  . amLODipine (NORVASC) 10 MG tablet TAKE 1 TABLET BY MOUTH EVERY DAY. Please keep upcoming appt in August for future refills. Thank you 30 tablet 2  . aspirin EC 81 MG tablet Take 81 mg by mouth every evening. 7 pm    . atorvastatin (LIPITOR) 10 MG tablet Take 10 mg by mouth at bedtime.  12  . benzonatate (TESSALON) 100 MG capsule Take 1 capsule (100 mg total) by mouth every 8 (eight) hours. 21 capsule 0  . buprenorphine (BUTRANS) 5 MCG/HR PTWK patch Place 1 patch (5 mcg total) onto the skin once  a week. 4 patch 2  . ciclopirox (PENLAC) 8 % solution Apply topically at bedtime. Apply over nail and surrounding skin. Apply daily over previous coat. After seven (7) days, may remove with alcohol and continue cycle. (Patient not taking: Reported on 03/27/2018) 6.6 mL 2  . DULoxetine (CYMBALTA) 30 MG capsule TAKE 1 CAPSULE BY MOUTH EVERY DAY 90 capsule 0  . DULoxetine (CYMBALTA) 30 MG capsule Take 1 capsule (30 mg total) by mouth daily.  90 capsule 0  . FeFum-FePoly-FA-B Cmp-C-Biot (INTEGRA PLUS) CAPS TAKE ONE CAPSULE BY MOUTH EVERY DAY *NOT COVERED* 30 capsule 5  . fluticasone (VERAMYST) 27.5 MCG/SPRAY nasal spray Place 2 sprays into the nose daily.    . furosemide (LASIX) 20 MG tablet TAKE 1 TABLET (20 MG TOTAL) BY MOUTH DAILY AS NEEDED FOR FLUID OR EDEMA. 30 tablet 3  . glimepiride (AMARYL) 1 MG tablet Take 1 mg by mouth daily.     . hydrALAZINE (APRESOLINE) 50 MG tablet TAKE 1 TABLET BY MOUTH THREE TIMES A DAY 270 tablet 2  . montelukast (SINGULAIR) 10 MG tablet Take 10 mg by mouth at bedtime.  1  . Multiple Vitamin (MULITIVITAMIN WITH MINERALS) TABS Take 1 tablet by mouth daily.    . nitroGLYCERIN (NITROSTAT) 0.4 MG SL tablet PLACE 1 TABLET UNDER THE TONGUE EVERY 5 MINUTES AS NEEDED FOR CHEST PAIN 25 tablet 2  . nystatin cream (MYCOSTATIN) Apply to affected area 2 times daily (Patient not taking: Reported on 03/27/2018) 60 g 2  . olmesartan (BENICAR) 40 MG tablet TAKE 1 TABLET (40 MG TOTAL) BY MOUTH 2 (TWO) TIMES DAILY. 180 tablet 1  . Omega-3 Fatty Acids (FISH OIL) 1200 MG CAPS Take 1 capsule by mouth daily.    Marland Kitchen omeprazole (PRILOSEC) 40 MG capsule Take 40 mg by mouth daily.    Marland Kitchen oxybutynin (DITROPAN-XL) 10 MG 24 hr tablet Take 10 mg daily by mouth.  1  . pioglitazone (ACTOS) 15 MG tablet Take 7.5 mg by mouth daily.     . potassium chloride (K-DUR,KLOR-CON) 10 MEQ tablet Take 20 mEq by mouth daily.    . primidone (MYSOLINE) 50 MG tablet TAKE 1 TABLET BY MOUTH AT BEDTIME 90 tablet 0  . Probiotic  Product (PROBIOTIC PO) Take 1 tablet by mouth daily.    Marland Kitchen Spacer/Aero-Holding Chambers (AEROCHAMBER MINI CHAMBER) DEVI use as directed with your inhalers     . traMADol (ULTRAM) 50 MG tablet Take 2 tablets (100 mg total) by mouth every 8 (eight) hours as needed. 180 tablet 5  . triamcinolone cream (KENALOG) 0.1 % Apply 1 application topically 2 (two) times daily. 30 g 0   No current facility-administered medications for this visit.      Musculoskeletal: Strength & Muscle Tone: decreased Gait & Station: unsteady Patient leans: N/A  Psychiatric Specialty Exam: Review of Systems  Musculoskeletal: Positive for back pain and joint pain.  Neurological: Positive for tingling and tremors.    Blood pressure 131/63, pulse 96, height 5' (1.524 m), weight 155 lb (70.3 kg).Body mass index is 30.27 kg/m.  General Appearance: Casual  Eye Contact:  Fair  Speech:  Slow  Volume:  Normal  Mood:  Dysphoric  Affect:  Congruent  Thought Process:  Goal Directed  Orientation:  Full (Time, Place, and Person)  Thought Content: Rumination   Suicidal Thoughts:  No  Homicidal Thoughts:  No  Memory:  Immediate;   Fair Recent;   Fair Remote;   Fair  Judgement:  Fair  Insight:  Fair  Psychomotor Activity:  Tremor  Concentration:  Concentration: Fair and Attention Span: Fair  Recall:  [employer] of Knowledge: Fair  Language: Fair  Akathisia:  No  Handed:  Right  AIMS (if indicated): not done  Assets:  Desire for Improvement Housing Social Support  ADL's:  Intact  Cognition: WNL  Sleep:  Good   Screenings: PHQ2-9     [facility]  PHQ-2 Total Score  3  3  0  PHQ-9 Total Score  -  7  11       Assessment and Plan: Major depressive disorder, recurrent.  Generalized anxiety disorder.  Patient doing better since  taking Cymbalta 30 mg.  She has more energy.  She is tolerating medication without any side effects.  Patient is not interested in counseling.  Patient agreed to continue Cymbalta 30 mg daily.  Plan discussed with the daughter who translates the patient.  Patient family does not want any translation services.  I recommended to call us back if she has any question or any concern.  Follow-up in 6 months.   Kathlee Nations, MD 04/25/2018, 2:27 PM

## 2018-04-26 DIAGNOSIS — M1990 Unspecified osteoarthritis, unspecified site: Secondary | ICD-10-CM | POA: Diagnosis not present

## 2018-05-03 ENCOUNTER — Other Ambulatory Visit: Payer: Self-pay | Admitting: Cardiovascular Disease

## 2018-05-23 ENCOUNTER — Ambulatory Visit (INDEPENDENT_AMBULATORY_CARE_PROVIDER_SITE_OTHER): Payer: Medicare Other | Admitting: Thoracic Surgery (Cardiothoracic Vascular Surgery)

## 2018-05-23 ENCOUNTER — Encounter: Payer: Self-pay | Admitting: Thoracic Surgery (Cardiothoracic Vascular Surgery)

## 2018-05-23 ENCOUNTER — Ambulatory Visit
Admission: RE | Admit: 2018-05-23 | Discharge: 2018-05-23 | Disposition: A | Discharge status: Home / Self Care | Payer: Medicare Other | Source: Ambulatory Visit | Attending: Thoracic Surgery (Cardiothoracic Vascular Surgery) | Admitting: Thoracic Surgery (Cardiothoracic Vascular Surgery)

## 2018-05-23 ENCOUNTER — Other Ambulatory Visit: Payer: Self-pay

## 2018-05-23 VITALS — BP 148/71 | HR 82 | Resp 18 | Ht 60.0 in | Wt 162.2 lb

## 2018-05-23 DIAGNOSIS — I712 Thoracic aortic aneurysm, without rupture, unspecified: Secondary | ICD-10-CM

## 2018-05-23 MED ORDER — IOPAMIDOL (ISOVUE-370) INJECTION 76%
75.0000 mL | Freq: Once | INTRAVENOUS | Status: AC | PRN
  Administered 2018-05-23: 75 mL via INTRAVENOUS

## 2018-05-23 NOTE — Progress Notes (Signed)
[address]       [place] [address]             [phone]    HPI: [name] for a scheduled annual follow-up visit  [name] is an 85 yo woman with multiple medical problems including hypertension, hyperlipidemia, ascending aneurysm, coronary artery calcification, previous resection for carcinoid tumor, lung nodules, moderate mitral stenosis, moderate aortic insufficiency, type 2 diabetes, osteoarthritis, and essential tremor.   She had a carcinoid tumor resected by Dr. Arlyce Dice many years ago.  She is been followed for multiple lung nodules, which have been stable over time.  She was noted to have an ascending aneurysm on her CT scan several years ago and we have continued to follow that as well.  I have discussed with the patient's daughter and the patient that I do not feel she is a candidate for surgical repair of the aneurysm but they wanted to follow it anyway.  She was being evaluated for hip surgery but apparently was turned down for medical reasons.  She continues to have pain in the hip.  She walks with a rolling walker.  She has very limited exercise tolerance.  She is not having any chest pain, pressure, or tightness.  She has not had any syncope or presyncope.  She refused a professional interpreter.  Past Medical History:  Diagnosis Date  . Ascending aortic aneurysm ([place])    a. 08/2015 stable 5 cm Asc Ao Aneurysm.  . Bronchitis   . Carcinoid tumor   . COPD (chronic obstructive pulmonary disease) ([place])   . Coronary artery disease    a. Ca2+ noted on CT - no other clear documentation of CAD.  Marland Kitchen Difficulty in walking(719.7)   . Disturbance of skin sensation   . Edema   . Essential hypertension   . GERD (gastroesophageal reflux disease)   . Hypercholesterolemia   . Hyperlipidemia   . Mitral valve disease    a. 10/2015 Echo: Mod MS, mild MR.  . Moderate aortic insufficiency    a. 10/2015 Echo: EF 50-55%, no rwma, mod AI, mod MS, mild MR, sev  dil LA, mild TR/PR, PASP 10mmHg.  . Pain in joint, forearm   . Pain in joint, hand   . Pain in joint, lower leg   . Pulmonary nodules    s. 08/2015 CT: stable lung nodules.  . Tremor, essential 08/30/2017  . Type II diabetes mellitus ([place])     Current Outpatient Medications  Medication Sig Dispense Refill  . albuterol (PROVENTIL HFA;VENTOLIN HFA) 108 (90 Base) MCG/ACT inhaler Inhale 1-2 puffs into the lungs every 6 (six) hours as needed for wheezing or shortness of breath. 1 Inhaler 0  . amLODipine (NORVASC) 10 MG tablet TAKE 1 TABLET BY MOUTH EVERY DAY. Please keep upcoming appt in August for future refills. Thank you 30 tablet 2  . aspirin EC 81 MG tablet Take 81 mg by mouth every evening. 7 pm    . atorvastatin (LIPITOR) 10 MG tablet Take 10 mg by mouth at bedtime.  12  . buprenorphine (BUTRANS) 5 MCG/HR PTWK patch Place 1 patch (5 mcg total) onto the skin once a week. 4 patch 2  . ciclopirox (PENLAC) 8 % solution Apply topically at bedtime. Apply over nail and surrounding skin. Apply daily over previous coat. After seven (7) days, may remove with alcohol and continue cycle. 6.6 mL 2  . DULoxetine (CYMBALTA) 30 MG capsule TAKE 1 CAPSULE BY  MOUTH EVERY DAY 90 capsule 0  . DULoxetine (CYMBALTA) 30 MG capsule Take 1 capsule (30 mg total) by mouth daily. 90 capsule 0  . FeFum-FePoly-FA-B Cmp-C-Biot (INTEGRA PLUS) CAPS TAKE ONE CAPSULE BY MOUTH EVERY DAY *NOT COVERED* 30 capsule 5  . fluticasone (VERAMYST) 27.5 MCG/SPRAY nasal spray Place 2 sprays into the nose daily.    . furosemide (LASIX) 20 MG tablet TAKE 1 TABLET (20 MG TOTAL) BY MOUTH DAILY AS NEEDED FOR FLUID OR EDEMA. 30 tablet 3  . glimepiride (AMARYL) 1 MG tablet Take 1 mg by mouth daily.     . hydrALAZINE (APRESOLINE) 50 MG tablet TAKE 1 TABLET BY MOUTH THREE TIMES A DAY 270 tablet 2  . montelukast (SINGULAIR) 10 MG tablet Take 10 mg by mouth at bedtime.  1  . Multiple Vitamin (MULITIVITAMIN WITH MINERALS) TABS Take 1 tablet by  mouth daily.    Marland Kitchen nystatin cream (MYCOSTATIN) Apply to affected area 2 times daily 60 g 2  . olmesartan (BENICAR) 40 MG tablet TAKE 1 TABLET BY MOUTH TWICE A DAY 180 tablet 0  . Omega-3 Fatty Acids (FISH OIL) 1200 MG CAPS Take 1 capsule by mouth daily.    Marland Kitchen omeprazole (PRILOSEC) 40 MG capsule Take 40 mg by mouth daily.    Marland Kitchen oxybutynin (DITROPAN-XL) 10 MG 24 hr tablet Take 10 mg daily by mouth.  1  . pioglitazone (ACTOS) 15 MG tablet Take 7.5 mg by mouth daily.     . potassium chloride (K-DUR,KLOR-CON) 10 MEQ tablet Take 20 mEq by mouth daily.    . primidone (MYSOLINE) 50 MG tablet TAKE 1 TABLET BY MOUTH AT BEDTIME 90 tablet 0  . Probiotic Product (PROBIOTIC PO) Take 1 tablet by mouth daily.    Marland Kitchen Spacer/Aero-Holding Chambers (AEROCHAMBER MINI CHAMBER) DEVI use as directed with your inhalers     . traMADol (ULTRAM) 50 MG tablet Take 2 tablets (100 mg total) by mouth every 8 (eight) hours as needed. 180 tablet 5  . triamcinolone cream (KENALOG) 0.1 % Apply 1 application topically 2 (two) times daily. 30 g 0  . nitroGLYCERIN (NITROSTAT) 0.4 MG SL tablet PLACE 1 TABLET UNDER THE TONGUE EVERY 5 MINUTES AS NEEDED FOR CHEST PAIN 25 tablet 2   No current facility-administered medications for this visit.     Physical Exam BP (!) 148/71 (BP Location: Left Arm, Patient Position: Sitting, Cuff Size: Large)   Pulse 82   Resp 18   Ht 5' (1.524 m)   Wt 162 lb 3.2 oz (73.6 kg)   SpO2 95% Comment: RA  BMI 31.68 kg/m  Elderly frail-appearing woman in no acute distress Obese Alert and oriented x3, resting tremor Cardiac regular rate and rhythm with 2/6 systolic and diastolic murmurs Lungs diminished breath sounds at bases, no rales or wheezing Extremities with 3+ edema  Diagnostic Tests: CT ANGIOGRAPHY CHEST WITH CONTRAST  TECHNIQUE: Multidetector CT imaging of the chest was performed using the standard protocol during bolus administration of intravenous contrast. Multiplanar CT image  reconstructions and MIPs were obtained to evaluate the vascular anatomy.  CONTRAST:  31mL ISOVUE-370 IOPAMIDOL (ISOVUE-370) INJECTION 76%  COMPARISON:  CT scan of April 13, 2017.  FINDINGS: Cardiovascular: Stable 5.4 cm ascending thoracic aortic aneurysm is noted. Transverse aortic arch measures 3.2 cm. Proximal descending thoracic aorta measures 3.0 cm. Atherosclerosis of thoracic aorta is noted without dissection. Great vessels are tortuous but widely patent. Mild cardiomegaly is noted. No pericardial effusion is noted.  Mediastinum/Nodes: No enlarged mediastinal, hilar,  or axillary lymph nodes. Thyroid gland, trachea, and esophagus demonstrate no significant findings.  Lungs/Pleura: No pneumothorax or pleural effusion is noted. Stable subsegmental atelectasis or scarring is noted in lingular segment of left upper lobe. Stable bilateral pulmonary nodules are noted.  Upper Abdomen: No acute abnormality.  Musculoskeletal: No chest wall abnormality. No acute or significant osseous findings.  Review of the MIP images confirms the above findings.  IMPRESSION: Stable 5.4 cm ascending thoracic aortic aneurysm. Recommend semi-annual imaging followup by CTA or MRA and referral to cardiothoracic surgery if not already obtained. This recommendation follows 2010 ACCF/AHA/AATS/ACR/ASA/SCA/SCAI/SIR/STS/SVM Guidelines for the Diagnosis and Management of Patients With Thoracic Aortic Disease. Circulation. 2010; 121: R427-C623.  Stable bilateral pulmonary nodules are noted.  Aortic Atherosclerosis (ICD10-I70.0).   Electronically Signed   By: Marijo Conception, M.D.   On: 05/23/2018 14:41 I personally reviewed the CT images and concur with the findings noted above.  I do think the ascending aneurysm is slightly larger than last time by about 2 mm.  Impression: [name] is an 84 year old woman with numerous medical problems including an ascending aortic aneurysm.  This  has grown slightly by a millimeter or 2 over the past year.  In my opinion she is not a candidate for elective or emergent repair of the aneurysm.  Therefore I do not think more frequent follow-up is indicated.  I did discuss with the patient and her daughter that she is at risk for dissection or rupture size of the aneurysm.  They do understand that  Hypertension-blood pressure elevated.  Emphasized importance of blood pressure control.  Her daughter again asked my opinion regarding hip surgery.  I am not sure that is still open for question at this point. I again told her that this would be very high risk given her medical comorbidities, that decision would be between her medical doctors and the orthopedist, and her.  Lung nodules-stable over time  Plan: Return in 1 year with CT chest  Melrose Nakayama, MD [facility] [phone]

## 2018-06-05 DIAGNOSIS — J4520 Mild intermittent asthma, uncomplicated: Secondary | ICD-10-CM | POA: Diagnosis not present

## 2018-06-05 DIAGNOSIS — Z79899 Other long term (current) drug therapy: Secondary | ICD-10-CM | POA: Diagnosis not present

## 2018-06-05 DIAGNOSIS — R918 Other nonspecific abnormal finding of lung field: Secondary | ICD-10-CM | POA: Diagnosis not present

## 2018-06-05 DIAGNOSIS — Z86012 Personal history of benign carcinoid tumor: Secondary | ICD-10-CM | POA: Diagnosis not present

## 2018-06-05 DIAGNOSIS — J45991 Cough variant asthma: Secondary | ICD-10-CM | POA: Diagnosis not present

## 2018-06-05 DIAGNOSIS — D3A090 Benign carcinoid tumor of the bronchus and lung: Secondary | ICD-10-CM | POA: Diagnosis not present

## 2018-06-14 NOTE — Progress Notes (Addendum)
Office Visit    Patient Name: [name] Date of Encounter: 06/15/2018  Primary Care Provider:  System, Pcp Not In Primary Cardiologist:  P. Johnsie Cancel, MD   Chief Complaint    84 year old female with history of hypertension, mitral and aortic valvular disease, and descending aortic aneurysm, who presents for follow-up.  Past Medical History    Past Medical History:  Diagnosis Date  . Ascending aortic aneurysm ([place])    a. 08/2015 stable 5 cm Asc Ao Aneurysm.  . Bronchitis   . Carcinoid tumor   . COPD (chronic obstructive pulmonary disease) ([place])   . Coronary artery disease    a. Ca2+ noted on CT - no other clear documentation of CAD.  Marland Kitchen Difficulty in walking(719.7)   . Disturbance of skin sensation   . Edema   . Essential hypertension   . GERD (gastroesophageal reflux disease)   . Hypercholesterolemia   . Hyperlipidemia   . Mitral valve disease    a. 10/2015 Echo: Mod MS, mild MR.  . Moderate aortic insufficiency    a. 10/2015 Echo: EF 50-55%, no rwma, mod AI, mod MS, mild MR, sev dil LA, mild TR/PR, PASP 27mmHg.  . Pain in joint, forearm   . Pain in joint, hand   . Pain in joint, lower leg   . Pulmonary nodules    s. 08/2015 CT: stable lung nodules.  . Tremor, essential 08/30/2017  . Type II diabetes mellitus ([place])    Past Surgical History:  Procedure Laterality Date  . bronch with endobronchial biopies  09/15/2009  . COLONOSCOPY N/A 09/13/2013   Procedure: COLONOSCOPY;  Surgeon: Jeryl Columbia, MD;  Location: [facility];  Service: Endoscopy;  Laterality: N/A;  . endobronchial excision of right upper lobe tumor with laser bronchi  10/06/2009  . fiberoptic bronch with endobronchial u/s  07/02/2010  . PARS PLANA VITRECTOMY     right eye  . PARS PLANA VITRECTOMY W/ ENDOPHOTOCOAGULATION     right eye  . posterior capsulectomy     right eye  . VIDEO BRONCHOSCOPY  12/09/2009    Allergies  No Known Allergies  History of Present Illness    84 y.o. with non  operable ascending aortic aneurysm 5 cm by CT 2017.  She is followed by Dr Roxan Hockey.  She also had AV/MV disease by TTE 2017 with moderate AR/MS and mild MR BP is labile but improved Chronic edema from venous insufficiency worse on right She has reflux in both common femoral and SFJ segments   She does not speak English primary issue per daughter is hip pain   Home Medications    Prior to Admission medications   Medication Sig Start Date End Date Taking? Authorizing Provider  albuterol (PROVENTIL HFA;VENTOLIN HFA) 108 (90 Base) MCG/ACT inhaler Inhale 2 puffs into the lungs every 6 (six) hours as needed for wheezing or shortness of breath.   Yes Historical Provider, MD  amLODipine (NORVASC) 10 MG tablet TAKE 1 TABLET (10 MG TOTAL) BY MOUTH DAILY. 01/27/16  Yes Josue Hector, MD  aspirin EC 81 MG tablet Take 81 mg by mouth every evening. 7 pm   Yes Historical Provider, MD  atorvastatin (LIPITOR) 10 MG tablet Take 10 mg by mouth at bedtime. 06/17/15  Yes Historical Provider, MD  BENICAR 40 MG tablet TAKE 1 TABLET (40 MG TOTAL) BY MOUTH 2 (TWO) TIMES DAILY. 10/21/15  Yes Josue Hector, MD  FeFum-FePoly-FA-B Cmp-C-Biot (INTEGRA PLUS) CAPS TAKE ONE CAPSULE BY MOUTH  EVERY DAY *NOT COVERED* 12/22/15  Yes Curt Bears, MD  fluticasone (FLOVENT HFA) 110 MCG/ACT inhaler Inhale 2 puffs into the lungs 2 (two) times daily.   Yes Historical Provider, MD  furosemide (LASIX) 20 MG tablet Take 1 tablet (20 mg total) by mouth daily as needed for fluid or edema. 08/29/15  Yes Josue Hector, MD  glimepiride (AMARYL) 1 MG tablet Take 1 mg by mouth daily.    Yes Historical Provider, MD  hydrALAZINE (APRESOLINE) 50 MG tablet Take 50 mg by mouth 3 (three) times daily.    Yes Historical Provider, MD  montelukast (SINGULAIR) 10 MG tablet Take 10 mg by mouth at bedtime. 06/04/15  Yes Historical Provider, MD  Multiple Vitamin (MULITIVITAMIN WITH MINERALS) TABS Take 1 tablet by mouth daily.   Yes Historical Provider, MD    nitroGLYCERIN (NITROSTAT) 0.4 MG SL tablet Place 0.4 mg under the tongue every 5 (five) minutes as needed for chest pain (3 DOSES MAX).   Yes Historical Provider, MD  Omega-3 Fatty Acids (FISH OIL) 1200 MG CAPS Take 1 capsule by mouth daily.   Yes Historical Provider, MD  omeprazole (PRILOSEC) 20 MG capsule Take 20 mg by mouth daily before breakfast. 06/04/15  Yes Historical Provider, MD  pioglitazone (ACTOS) 15 MG tablet Take 7.5 mg by mouth daily.    Yes Historical Provider, MD  potassium chloride (K-DUR,KLOR-CON) 10 MEQ tablet Take 20 mEq by mouth daily.   Yes Historical Provider, MD  Probiotic Product (PROBIOTIC PO) Take 1 tablet by mouth daily.   Yes Historical Provider, MD  Spacer/Aero-Holding Chambers (AEROCHAMBER MINI CHAMBER) DEVI use as directed with your inhalers  04/27/11  Yes Historical Provider, MD    Review of Systems     as above, patient has been doing recently well. She has chronic right lower extremity edema and is followed in wound clinic with improved healing. No recent history of chest pain, dyspnea, PND, orthopnea or dizziness, syncope, or early satiety.  All other systems reviewed and are otherwise negative except as noted above.  Physical Exam    VS:  BP 110/60   Pulse 60   Ht 5' (1.524 m)   Wt 161 lb 8 oz (73.3 kg)   SpO2 95%   BMI 31.54 kg/m  , BMI Body mass index is 31.54 kg/m. Affect appropriate Elderly russian female  HEENT: normal Neck supple with no adenopathy JVP normal no bruits no thyromegaly Lungs clear with no wheezing and good diaphragmatic motion Heart:  S1/S2 SEM AR/MR  murmur, no rub, gallop or click PMI normal Abdomen: benighn, BS positve, no tenderness, no AAA no bruit.  No HSM or HJR Distal pulses intact with no bruits No edema Neuro non-focal Skin warm and dry No muscular weakness   Accessory Clinical Findings     regular sinus rhythm, 66,  Left axis deviation , left anterior fascicular block ,first-degree AV block, PACs , PVC,  poor R progression -no acute ST or T changes.  Assessment & Plan    1.   Essential hypertension:  Continue 3 drug Rx Tolerate some systolic HTN given age    5.  Ascending aortic aneurysm: stable at 5 cm based on CT in November 2016. She is not felt to be a surgical candidate. She is being managed conservatively by Dr. Roxan Hockey.    3. Mitral and aortic valve disease: conservatively managed in the setting of poor surgical candidacy. Echo 11/18/15 moderate MS/mild MR and moderate AR    4. Disposition: patient  will follow-up with me in 6 months   She is seeing Dr Alvan Dame for orthopedic issues although not ideal she would not Be a prohibitive candidate for THR/TKR if needed   [employer]

## 2018-06-15 ENCOUNTER — Ambulatory Visit (INDEPENDENT_AMBULATORY_CARE_PROVIDER_SITE_OTHER): Payer: Medicare Other | Admitting: Cardiovascular Disease

## 2018-06-15 ENCOUNTER — Encounter: Payer: Self-pay | Admitting: Cardiovascular Disease

## 2018-06-15 VITALS — BP 110/60 | HR 60 | Ht 60.0 in | Wt 161.5 lb

## 2018-06-15 DIAGNOSIS — I359 Nonrheumatic aortic valve disorder, unspecified: Secondary | ICD-10-CM

## 2018-06-15 DIAGNOSIS — I059 Rheumatic mitral valve disease, unspecified: Secondary | ICD-10-CM

## 2018-06-15 DIAGNOSIS — I10 Essential (primary) hypertension: Secondary | ICD-10-CM

## 2018-06-15 NOTE — Patient Instructions (Addendum)
Medication Instructions:  Your physician recommends that you continue on your current medications as directed. Please refer to the Current Medication list given to you today.  Labwork: NONE  Testing/Procedures: NONE  Follow-Up: Your physician wants you to follow-up in:1 year with Dr. Johnsie Cancel. You will receive a reminder letter in the mail two months in advance. If you don't receive a letter, please call our office to schedule the follow-up appointment.   If you need a refill on your cardiac medications before your next appointment, please call your pharmacy.

## 2018-07-04 DIAGNOSIS — J45909 Unspecified asthma, uncomplicated: Secondary | ICD-10-CM | POA: Diagnosis not present

## 2018-07-04 DIAGNOSIS — I110 Hypertensive heart disease with heart failure: Secondary | ICD-10-CM | POA: Diagnosis not present

## 2018-07-04 DIAGNOSIS — Z79899 Other long term (current) drug therapy: Secondary | ICD-10-CM | POA: Diagnosis not present

## 2018-07-04 DIAGNOSIS — R05 Cough: Secondary | ICD-10-CM | POA: Diagnosis not present

## 2018-07-04 DIAGNOSIS — I5032 Chronic diastolic (congestive) heart failure: Secondary | ICD-10-CM | POA: Diagnosis not present

## 2018-07-17 ENCOUNTER — Other Ambulatory Visit: Payer: Self-pay | Admitting: Cardiovascular Disease

## 2018-07-17 DIAGNOSIS — R0602 Shortness of breath: Secondary | ICD-10-CM | POA: Diagnosis not present

## 2018-07-17 DIAGNOSIS — I350 Nonrheumatic aortic (valve) stenosis: Secondary | ICD-10-CM | POA: Diagnosis not present

## 2018-07-17 DIAGNOSIS — I440 Atrioventricular block, first degree: Secondary | ICD-10-CM | POA: Diagnosis not present

## 2018-07-17 DIAGNOSIS — I517 Cardiomegaly: Secondary | ICD-10-CM | POA: Diagnosis not present

## 2018-07-17 DIAGNOSIS — I444 Left anterior fascicular block: Secondary | ICD-10-CM | POA: Diagnosis not present

## 2018-07-17 DIAGNOSIS — R9431 Abnormal electrocardiogram [ECG] [EKG]: Secondary | ICD-10-CM | POA: Diagnosis not present

## 2018-07-17 DIAGNOSIS — Z79899 Other long term (current) drug therapy: Secondary | ICD-10-CM | POA: Diagnosis not present

## 2018-07-17 DIAGNOSIS — I5030 Unspecified diastolic (congestive) heart failure: Secondary | ICD-10-CM | POA: Diagnosis not present

## 2018-07-17 DIAGNOSIS — Z23 Encounter for immunization: Secondary | ICD-10-CM | POA: Diagnosis not present

## 2018-07-18 ENCOUNTER — Ambulatory Visit (INDEPENDENT_AMBULATORY_CARE_PROVIDER_SITE_OTHER): Payer: Medicare Other | Admitting: Podiatry

## 2018-07-18 DIAGNOSIS — E119 Type 2 diabetes mellitus without complications: Secondary | ICD-10-CM

## 2018-07-18 DIAGNOSIS — M79675 Pain in left toe(s): Secondary | ICD-10-CM | POA: Diagnosis not present

## 2018-07-18 DIAGNOSIS — M79674 Pain in right toe(s): Secondary | ICD-10-CM

## 2018-07-18 DIAGNOSIS — B351 Tinea unguium: Secondary | ICD-10-CM

## 2018-07-19 NOTE — Progress Notes (Signed)
Subjective: 84 y.o. returns the office today for painful, elongated, thickened toenails which she cannot trim herself.  Denies any redness or drainage around the nails.  Denies any open wounds. Denies any acute changes since last appointment and no new complaints today. Denies any systemic complaints such as fevers, chills, nausea, vomiting.   Objective: NAD DP/PT pulses palpable, CRT less than 3 seconds Nails hypertrophic, dystrophic, elongated, brittle, discolored 10. There is tenderness overlying the nails 1-5 bilaterally. There is no surrounding erythema or drainage along the nail sites. No open lesions or pre-ulcerative lesions are identified. No pain with calf compression, swelling, warmth, erythema. Overall no changes  Assessment: Patient presents with symptomatic onychomycosis  Plan: -Treatment options including alternatives, risks, complications were discussed -Nails sharply debrided 10 without complication/bleeding. -Discussed daily foot inspection. If there are any changes, to call the office immediately.  -Follow-up in 3 months or sooner if any problems are to arise. In the meantime, encouraged to call the office with any questions, concerns, changes symptoms.  Celesta Gentile, DPM

## 2018-08-13 ENCOUNTER — Other Ambulatory Visit: Payer: Self-pay | Admitting: Physical Medicine & Rehabilitation

## 2018-08-15 DIAGNOSIS — I5032 Chronic diastolic (congestive) heart failure: Secondary | ICD-10-CM | POA: Diagnosis not present

## 2018-08-15 DIAGNOSIS — L97901 Non-pressure chronic ulcer of unspecified part of unspecified lower leg limited to breakdown of skin: Secondary | ICD-10-CM | POA: Diagnosis not present

## 2018-08-15 DIAGNOSIS — I350 Nonrheumatic aortic (valve) stenosis: Secondary | ICD-10-CM | POA: Diagnosis not present

## 2018-08-15 DIAGNOSIS — M25562 Pain in left knee: Secondary | ICD-10-CM | POA: Diagnosis not present

## 2018-08-15 DIAGNOSIS — M25561 Pain in right knee: Secondary | ICD-10-CM | POA: Diagnosis not present

## 2018-08-15 DIAGNOSIS — M1611 Unilateral primary osteoarthritis, right hip: Secondary | ICD-10-CM | POA: Diagnosis not present

## 2018-08-15 DIAGNOSIS — I714 Abdominal aortic aneurysm, without rupture: Secondary | ICD-10-CM | POA: Diagnosis not present

## 2018-08-15 DIAGNOSIS — I5030 Unspecified diastolic (congestive) heart failure: Secondary | ICD-10-CM | POA: Diagnosis not present

## 2018-08-15 DIAGNOSIS — Z79899 Other long term (current) drug therapy: Secondary | ICD-10-CM | POA: Diagnosis not present

## 2018-08-15 DIAGNOSIS — I110 Hypertensive heart disease with heart failure: Secondary | ICD-10-CM | POA: Diagnosis not present

## 2018-08-15 DIAGNOSIS — E119 Type 2 diabetes mellitus without complications: Secondary | ICD-10-CM | POA: Diagnosis not present

## 2018-08-15 DIAGNOSIS — K649 Unspecified hemorrhoids: Secondary | ICD-10-CM | POA: Diagnosis not present

## 2018-08-15 DIAGNOSIS — I83009 Varicose veins of unspecified lower extremity with ulcer of unspecified site: Secondary | ICD-10-CM | POA: Diagnosis not present

## 2018-09-06 ENCOUNTER — Other Ambulatory Visit: Payer: Self-pay | Admitting: Internal Medicine

## 2018-09-06 DIAGNOSIS — C7A090 Malignant carcinoid tumor of the bronchus and lung: Secondary | ICD-10-CM

## 2018-09-11 DIAGNOSIS — K5900 Constipation, unspecified: Secondary | ICD-10-CM | POA: Diagnosis not present

## 2018-09-11 DIAGNOSIS — K219 Gastro-esophageal reflux disease without esophagitis: Secondary | ICD-10-CM | POA: Diagnosis not present

## 2018-09-11 DIAGNOSIS — Z86010 Personal history of colonic polyps: Secondary | ICD-10-CM | POA: Diagnosis not present

## 2018-09-15 ENCOUNTER — Encounter: Payer: Self-pay | Admitting: Physical Medicine & Rehabilitation

## 2018-09-15 ENCOUNTER — Ambulatory Visit (HOSPITAL_BASED_OUTPATIENT_CLINIC_OR_DEPARTMENT_OTHER): Payer: Medicare Other | Admitting: Physical Medicine & Rehabilitation

## 2018-09-15 ENCOUNTER — Encounter: Payer: Medicare Other | Attending: Physical Medicine & Rehabilitation

## 2018-09-15 VITALS — BP 123/68 | HR 75 | Ht 60.0 in | Wt 175.0 lb

## 2018-09-15 DIAGNOSIS — E785 Hyperlipidemia, unspecified: Secondary | ICD-10-CM | POA: Diagnosis not present

## 2018-09-15 DIAGNOSIS — I2510 Atherosclerotic heart disease of native coronary artery without angina pectoris: Secondary | ICD-10-CM | POA: Insufficient documentation

## 2018-09-15 DIAGNOSIS — J449 Chronic obstructive pulmonary disease, unspecified: Secondary | ICD-10-CM | POA: Diagnosis not present

## 2018-09-15 DIAGNOSIS — M24551 Contracture, right hip: Secondary | ICD-10-CM

## 2018-09-15 DIAGNOSIS — I10 Essential (primary) hypertension: Secondary | ICD-10-CM | POA: Diagnosis not present

## 2018-09-15 DIAGNOSIS — K219 Gastro-esophageal reflux disease without esophagitis: Secondary | ICD-10-CM | POA: Insufficient documentation

## 2018-09-15 DIAGNOSIS — M1611 Unilateral primary osteoarthritis, right hip: Secondary | ICD-10-CM

## 2018-09-15 DIAGNOSIS — E119 Type 2 diabetes mellitus without complications: Secondary | ICD-10-CM | POA: Diagnosis not present

## 2018-09-15 NOTE — Progress Notes (Signed)
Subjective:    Patient ID: [name], female    DOB: [birth year], 84 y.o.   MRN: [id number]  HPI Right hip is better Patient has had no new treatments for that hip.  We discussed that given her 0/10 pain score that the patches are no longer needed and in fact the patient has not used them for a long time.  We discussed that this is not a medicine that can be used on a as needed basis. Daughter's main complaint is that the patient is not very active.  This is because patient states she does not want to walk much.  She denies that pain is her main factor. No shortness of breath with walking Prior history with moderate aortic stenosis, does have a ejection fraction of 50 to 55% noted on 11/18/2015 echo Pain Inventory Average Pain 0 Pain Right Now 0 My pain is .  In the last 24 hours, has pain interfered with the following? General activity 0 Relation with others 0 Enjoyment of life 0 What TIME of day is your pain at its worst? night Sleep (in general) Fair  Pain is worse with: walking Pain improves with: medication Relief from Meds: 5  Mobility use a walker ability to climb steps?  no do you drive?  no  Function I need assistance with the following:  bathing, toileting, meal prep, household duties and shopping  Neuro/Psych weakness  Prior Studies Any changes since last visit?  no  Physicians involved in your care Any changes since last visit?  no   Family History  Problem Relation Age of Onset  . Hypertension Mother   . Hypertension Father   . Heart attack Neg Hx   . Stroke Neg Hx    Social History   Socioeconomic History  . Marital status: Married    Spouse name: Not on file  . Number of children: 1  . Years of education: 8  . Highest education level: Not on file  Occupational History  . Not on file  Social Needs  . Financial resource strain: Not on file  . Food insecurity:    Worry: Not on file    Inability: Not on file  . Transportation needs:   Medical: Not on file    Non-medical: Not on file  Tobacco Use  . Smoking status: Never Smoker  . Smokeless tobacco: Never Used  Substance and Sexual Activity  . Alcohol use: No  . Drug use: No  . Sexual activity: Not Currently  Lifestyle  . Physical activity:    Days per week: Not on file    Minutes per session: Not on file  . Stress: Not on file  Relationships  . Social connections:    Talks on phone: Not on file    Gets together: Not on file    Attends religious service: Not on file    Active member of club or organization: Not on file    Attends meetings of clubs or organizations: Not on file    Relationship status: Not on file  Other Topics Concern  . Not on file  Social History Narrative   Lives with family (husband and daughter)    Caffeine use: Coffee/tea sometimes   Right handed    Past Surgical History:  Procedure Laterality Date  . bronch with endobronchial biopies  09/15/2009  . COLONOSCOPY N/A 09/13/2013   Procedure: COLONOSCOPY;  Surgeon: Jeryl Columbia, MD;  Location: [facility];  Service: Endoscopy;  Laterality: N/A;  .  endobronchial excision of right upper lobe tumor with laser bronchi  10/06/2009  . fiberoptic bronch with endobronchial u/s  07/02/2010  . PARS PLANA VITRECTOMY     right eye  . PARS PLANA VITRECTOMY W/ ENDOPHOTOCOAGULATION     right eye  . posterior capsulectomy     right eye  . VIDEO BRONCHOSCOPY  12/09/2009   Past Medical History:  Diagnosis Date  . Ascending aortic aneurysm ([place])    a. 08/2015 stable 5 cm Asc Ao Aneurysm.  . Bronchitis   . Carcinoid tumor   . COPD (chronic obstructive pulmonary disease) ([place])   . Coronary artery disease    a. Ca2+ noted on CT - no other clear documentation of CAD.  Marland Kitchen Difficulty in walking(719.7)   . Disturbance of skin sensation   . Edema   . Essential hypertension   . GERD (gastroesophageal reflux disease)   . Hypercholesterolemia   . Hyperlipidemia   . Mitral valve disease    a. 10/2015  Echo: Mod MS, mild MR.  . Moderate aortic insufficiency    a. 10/2015 Echo: EF 50-55%, no rwma, mod AI, mod MS, mild MR, sev dil LA, mild TR/PR, PASP 34mmHg.  . Pain in joint, forearm   . Pain in joint, hand   . Pain in joint, lower leg   . Pulmonary nodules    s. 08/2015 CT: stable lung nodules.  . Tremor, essential 08/30/2017  . Type II diabetes mellitus ([place])    There were no vitals taken for this visit.  Opioid Risk Score:   Fall Risk Score:  `1  Depression screen PHQ 2/9  Depression screen [facility] 2/9 07/19/2016 01/23/2016 01/07/2015  Decreased Interest 3 3 0  Down, Depressed, Hopeless 0 0 0  PHQ - 2 Score 3 3 0  Altered sleeping - 3 3  Tired, decreased energy - 1 2  Change in appetite - 0 0  Feeling bad or failure about yourself  - 0 3  Trouble concentrating - 0 0  Moving slowly or fidgety/restless - 0 3  Suicidal thoughts - 0 0  PHQ-9 Score - 7 11  Difficult doing work/chores - Not difficult at all -  Some recent data might be hidden    Review of Systems  Constitutional: Negative.   HENT: Negative.   Eyes: Negative.   Respiratory: Positive for cough.   Cardiovascular: Negative.   Gastrointestinal: Positive for constipation.  Endocrine: Negative.   Genitourinary: Negative.   Musculoskeletal: Positive for arthralgias and myalgias.  Skin: Negative.   Allergic/Immunologic: Negative.   Neurological: Positive for weakness.  Psychiatric/Behavioral: Negative.   All other systems reviewed and are negative.      Objective:   Physical Exam  Constitutional: She is oriented to person, place, and time. She appears well-developed and well-nourished.  HENT:  Head: Normocephalic and atraumatic.  Eyes: Pupils are equal, round, and reactive to light. EOM are normal.  Neck: Normal range of motion.  Neurological: She is alert and oriented to person, place, and time.  Skin: Skin is warm and dry.  Psychiatric: She has a normal mood and affect.  Nursing note and vitals  reviewed.  Patient with decreased internal and external rotation of the left and the right hip but no pain during range of motion.  Motor strength is 4- at the right hip flexors knee extensors 5 at the ankle dorsiflexors 5 at the left hip flexor knee extensor ankle dorsiflexor        Assessment & Plan:  1.  Right hip osteoarthritis improvements with pain.  No longer using buprenorphine patch.  Daughter is asking about what to do for as needed pain medication.  We discussed that the patch is meant to be used for daily tonic pain.  We discussed that Tylenol would be better medication at this point for her.  Given her age I would avoid NSAIDs, patient may try heating pad half hour at a time  Biggest problem now appears to be weakness in the right lower extremity and decreased walking.  The patient states she does not like to exercise.  Her husband does have a bicycle ergometer that he uses on a regular basis I encouraged her to use this.  The daughter does not feel like the patient will follow through on this.  The patient has done physical therapy in the past with some benefit, will make referral to [facility] location  Physical medicine rehab follow-up in 6 months

## 2018-09-15 NOTE — Patient Instructions (Addendum)
Tylenol 1-2 tablets if needed for pain  Use heating pad for pain

## 2018-09-25 DIAGNOSIS — E7800 Pure hypercholesterolemia, unspecified: Secondary | ICD-10-CM | POA: Diagnosis not present

## 2018-09-25 DIAGNOSIS — E1159 Type 2 diabetes mellitus with other circulatory complications: Secondary | ICD-10-CM | POA: Diagnosis not present

## 2018-09-29 ENCOUNTER — Other Ambulatory Visit: Payer: Self-pay | Admitting: Neurology

## 2018-10-02 ENCOUNTER — Telehealth: Payer: Self-pay

## 2018-10-02 DIAGNOSIS — E7800 Pure hypercholesterolemia, unspecified: Secondary | ICD-10-CM | POA: Diagnosis not present

## 2018-10-02 DIAGNOSIS — E1159 Type 2 diabetes mellitus with other circulatory complications: Secondary | ICD-10-CM | POA: Diagnosis not present

## 2018-10-02 DIAGNOSIS — I10 Essential (primary) hypertension: Secondary | ICD-10-CM | POA: Diagnosis not present

## 2018-10-02 DIAGNOSIS — I350 Nonrheumatic aortic (valve) stenosis: Secondary | ICD-10-CM | POA: Diagnosis not present

## 2018-10-02 DIAGNOSIS — I2510 Atherosclerotic heart disease of native coronary artery without angina pectoris: Secondary | ICD-10-CM | POA: Diagnosis not present

## 2018-10-02 NOTE — Telephone Encounter (Addendum)
**Note De-Identified [name] Obfuscation** We received a letter from [facility] stating that they have approved the pts Benicar PA. Approval good from 10/25/2018 until further notice.  I have notified the pts pharmacy.

## 2018-10-09 ENCOUNTER — Inpatient Hospital Stay (HOSPITAL_BASED_OUTPATIENT_CLINIC_OR_DEPARTMENT_OTHER): Payer: Medicare Other | Admitting: Internal Medicine

## 2018-10-09 ENCOUNTER — Ambulatory Visit (HOSPITAL_COMMUNITY)
Admission: RE | Admit: 2018-10-09 | Discharge: 2018-10-09 | Disposition: A | Discharge status: Home / Self Care | Payer: Medicare Other | Source: Ambulatory Visit | Attending: Internal Medicine | Admitting: Internal Medicine

## 2018-10-09 ENCOUNTER — Encounter: Payer: Self-pay | Admitting: Internal Medicine

## 2018-10-09 ENCOUNTER — Other Ambulatory Visit: Payer: Self-pay

## 2018-10-09 ENCOUNTER — Inpatient Hospital Stay: Payer: Medicare Other | Attending: Internal Medicine

## 2018-10-09 VITALS — BP 135/69 | HR 88 | Temp 97.8°F | Resp 20 | Ht 60.0 in | Wt 169.6 lb

## 2018-10-09 DIAGNOSIS — E119 Type 2 diabetes mellitus without complications: Secondary | ICD-10-CM | POA: Insufficient documentation

## 2018-10-09 DIAGNOSIS — Z7982 Long term (current) use of aspirin: Secondary | ICD-10-CM | POA: Insufficient documentation

## 2018-10-09 DIAGNOSIS — Z85110 Personal history of malignant carcinoid tumor of bronchus and lung: Secondary | ICD-10-CM

## 2018-10-09 DIAGNOSIS — C7A090 Malignant carcinoid tumor of the bronchus and lung: Secondary | ICD-10-CM

## 2018-10-09 DIAGNOSIS — I10 Essential (primary) hypertension: Secondary | ICD-10-CM | POA: Insufficient documentation

## 2018-10-09 DIAGNOSIS — D649 Anemia, unspecified: Secondary | ICD-10-CM | POA: Insufficient documentation

## 2018-10-09 DIAGNOSIS — Z7984 Long term (current) use of oral hypoglycemic drugs: Secondary | ICD-10-CM

## 2018-10-09 DIAGNOSIS — R918 Other nonspecific abnormal finding of lung field: Secondary | ICD-10-CM | POA: Insufficient documentation

## 2018-10-09 DIAGNOSIS — Z902 Acquired absence of lung [part of]: Secondary | ICD-10-CM | POA: Insufficient documentation

## 2018-10-09 DIAGNOSIS — Z79899 Other long term (current) drug therapy: Secondary | ICD-10-CM | POA: Diagnosis not present

## 2018-10-09 DIAGNOSIS — J449 Chronic obstructive pulmonary disease, unspecified: Secondary | ICD-10-CM | POA: Diagnosis not present

## 2018-10-09 LAB — CMP (CANCER CENTER ONLY)
ALT: 14 U/L (ref 0–44)
AST: 18 U/L (ref 15–41)
Albumin: 3.8 g/dL (ref 3.5–5.0)
Alkaline Phosphatase: 98 U/L (ref 38–126)
Anion gap: 9 (ref 5–15)
BUN: 23 mg/dL (ref 8–23)
CHLORIDE: 103 mmol/L (ref 98–111)
CO2: 30 mmol/L (ref 22–32)
CREATININE: 0.83 mg/dL (ref 0.44–1.00)
Calcium: 9.3 mg/dL (ref 8.9–10.3)
GFR, Est AFR Am: >60 mL/min (ref >60)
GFR, Estimated: >60 mL/min (ref >60)
GLUCOSE: 155 mg/dL — AB (ref 70–99)
Potassium: 4.7 mmol/L (ref 3.5–5.1)
SODIUM: 142 mmol/L (ref 135–145)
Total Bilirubin: 0.4 mg/dL (ref 0.3–1.2)
Total Protein: 7.1 g/dL (ref 6.5–8.1)

## 2018-10-09 LAB — CBC WITH DIFFERENTIAL (CANCER CENTER ONLY)
Abs Immature Granulocytes: 0.02 10*3/uL (ref 0.00–0.07)
BASOS ABS: 0 10*3/uL (ref 0.0–0.1)
Basophils Relative: 0 %
EOS PCT: 1 %
Eosinophils Absolute: 0.1 10*3/uL (ref 0.0–0.5)
HEMATOCRIT: 40.2 % (ref 36.0–46.0)
HEMOGLOBIN: 12.7 g/dL (ref 12.0–15.0)
Immature Granulocytes: 0 %
LYMPHS PCT: 24 %
Lymphs Abs: 1.8 10*3/uL (ref 0.7–4.0)
MCH: 30 pg (ref 26.0–34.0)
MCHC: 31.6 g/dL (ref 30.0–36.0)
MCV: 95 fL (ref 80.0–100.0)
Monocytes Absolute: 0.6 10*3/uL (ref 0.1–1.0)
Monocytes Relative: 8 %
Neutro Abs: 5.1 10*3/uL (ref 1.7–7.7)
Neutrophils Relative %: 67 %
Platelet Count: 265 10*3/uL (ref 150–400)
RBC: 4.23 MIL/uL (ref 3.87–5.11)
RDW: 13.5 % (ref 11.5–15.5)
WBC Count: 7.6 10*3/uL (ref 4.0–10.5)
nRBC: 0 % (ref 0.0–0.2)

## 2018-10-09 NOTE — Progress Notes (Signed)
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  PRIOR THERAPY: 1. Status post resection of multiple nodules from the left lung under the care of Dr Arlyce Dice on July 15, 2005. 2. Status post repeat bronchoscopy with endobronchial excision of the right upper lobe tumor and laser bronchoscopy under the care of Dr Arlyce Dice on October 08, 2009.  CURRENT THERAPY: Observation.  INTERVAL HISTORY: [name] 84 y.o. female returns to the clinic today for follow-up visit accompanied by her daughter.  The patient is feeling fine today with no concerning complaints.  She denied having any fatigue or weakness.  She denied having any chest pain, shortness of breath, cough or hemoptysis.  She has no nausea, vomiting, diarrhea or constipation.  She had CT angiogram of the chest performed in July 2019 that showed stable pulmonary nodules.  The patient also had chest x-ray earlier today and she is here for evaluation and discussion of her lab and imaging studies.   MEDICAL HISTORY: Past Medical History:  Diagnosis Date  . Ascending aortic aneurysm ([place])    a. 08/2015 stable 5 cm Asc Ao Aneurysm.  . Bronchitis   . Carcinoid tumor   . COPD (chronic obstructive pulmonary disease) ([place])   . Coronary artery disease    a. Ca2+ noted on CT - no other clear documentation of CAD.  Marland Kitchen Difficulty in walking(719.7)   . Disturbance of skin sensation   . Edema   . Essential hypertension   . GERD (gastroesophageal reflux disease)   . Hypercholesterolemia   . Hyperlipidemia   . Mitral valve disease    a. 10/2015 Echo: Mod MS, mild MR.  . Moderate aortic insufficiency    a. 10/2015 Echo: EF 50-55%, no rwma, mod AI, mod MS, mild MR, sev dil LA, mild TR/PR, PASP 49mmHg.  . Pain in joint, forearm   . Pain in joint, hand   . Pain in joint, lower leg     . Pulmonary nodules    s. 08/2015 CT: stable lung nodules.  . Tremor, essential 08/30/2017  . Type II diabetes mellitus (H[facility])     ALLERGIES:  has No Known Allergies.  MEDICATIONS:  Current Outpatient Medications  Medication Sig Dispense Refill  . albuterol (PROVENTIL HFA;VENTOLIN HFA) 108 (90 Base) MCG/ACT inhaler Inhale 1-2 puffs into the lungs every 6 (six) hours as needed for wheezing or shortness of breath. 1 Inhaler 0  . amLODipine (NORVASC) 10 MG tablet TAKE 1 TABLET BY MOUTH EVERY DAY. Please keep upcoming appt in August for future refills. Thank you 30 tablet 2  . aspirin EC 81 MG tablet Take 81 mg by mouth every evening. 7 pm    . atorvastatin (LIPITOR) 10 MG tablet Take 10 mg by mouth at bedtime.  12  . buprenorphine (BUTRANS) 5 MCG/HR PTWK patch Place 1 patch (5 mcg total) onto the skin once a week. 4 patch 2  . ciclopirox (PENLAC) 8 % solution Apply topically at bedtime. Apply over nail and surrounding skin. Apply daily over previous coat. After seven (7) days, may remove with alcohol and continue cycle. 6.6 mL 2  . DULoxetine (CYMBALTA) 30 MG capsule TAKE 1 CAPSULE BY MOUTH EVERY DAY 90 capsule 0  . FeFum-FePoly-FA-B Cmp-C-Biot (INTEGRA PLUS) CAPS TAKE ONE CAPSULE BY MOUTH EVERY DAY *NOT COVERED* 30 capsule 5  .  fluticasone (VERAMYST) 27.5 MCG/SPRAY nasal spray Place 2 sprays into the nose daily.    . furosemide (LASIX) 20 MG tablet TAKE 1 TABLET (20 MG TOTAL) BY MOUTH DAILY AS NEEDED FOR FLUID OR EDEMA. 30 tablet 3  . glimepiride (AMARYL) 1 MG tablet Take 1 mg by mouth daily.     . hydrALAZINE (APRESOLINE) 50 MG tablet TAKE 1 TABLET BY MOUTH THREE TIMES A DAY 270 tablet 2  . hydrochlorothiazide (MICROZIDE) 12.5 MG capsule Take by mouth daily.  1  . montelukast (SINGULAIR) 10 MG tablet Take 10 mg by mouth at bedtime.  1  . Multiple Vitamin (MULITIVITAMIN WITH MINERALS) TABS Take 1 tablet by mouth daily.    . nitroGLYCERIN (NITROSTAT) 0.4 MG SL tablet PLACE 1 TABLET UNDER THE  TONGUE EVERY 5 MINUTES AS NEEDED FOR CHEST PAIN 25 tablet 2  . nystatin cream (MYCOSTATIN) Apply to affected area 2 times daily 60 g 2  . olmesartan (BENICAR) 40 MG tablet TAKE 1 TABLET BY MOUTH TWICE A DAY 180 tablet 3  . Omega-3 Fatty Acids (FISH OIL) 1200 MG CAPS Take 1 capsule by mouth daily.    Marland Kitchen omeprazole (PRILOSEC) 40 MG capsule Take 40 mg by mouth daily.    Marland Kitchen oxybutynin (DITROPAN-XL) 10 MG 24 hr tablet Take 10 mg daily by mouth.  1  . pioglitazone (ACTOS) 15 MG tablet Take 7.5 mg by mouth daily.     . potassium chloride (K-DUR,KLOR-CON) 10 MEQ tablet Take 20 mEq by mouth daily.    . primidone (MYSOLINE) 50 MG tablet TAKE 1 TABLET BY MOUTH EVERYDAY AT BEDTIME 90 tablet 0  . Probiotic Product (PROBIOTIC PO) Take 1 tablet by mouth daily.    Marland Kitchen Spacer/Aero-Holding Chambers (AEROCHAMBER MINI CHAMBER) DEVI use as directed with your inhalers     . traMADol (ULTRAM) 50 MG tablet Take 2 tablets (100 mg total) by mouth every 8 (eight) hours as needed. 180 tablet 5  . triamcinolone cream (KENALOG) 0.1 % Apply 1 application topically 2 (two) times daily. 30 g 0   No current facility-administered medications for this visit.     SURGICAL HISTORY:  Past Surgical History:  Procedure Laterality Date  . bronch with endobronchial biopies  09/15/2009  . COLONOSCOPY N/A 09/13/2013   Procedure: COLONOSCOPY;  Surgeon: Jeryl Columbia, MD;  Location: [facility];  Service: Endoscopy;  Laterality: N/A;  . endobronchial excision of right upper lobe tumor with laser bronchi  10/06/2009  . fiberoptic bronch with endobronchial u/s  07/02/2010  . PARS PLANA VITRECTOMY     right eye  . PARS PLANA VITRECTOMY W/ ENDOPHOTOCOAGULATION     right eye  . posterior capsulectomy     right eye  . VIDEO BRONCHOSCOPY  12/09/2009    REVIEW OF SYSTEMS:  A comprehensive review of systems was negative.   PHYSICAL EXAMINATION: General appearance: alert, cooperative and no distress Head: Normocephalic, without obvious  abnormality, atraumatic Neck: no adenopathy, no JVD, supple, symmetrical, trachea midline and thyroid not enlarged, symmetric, no tenderness/mass/nodules Lymph nodes: Cervical, supraclavicular, and axillary nodes normal. Resp: clear to auscultation bilaterally Back: symmetric, no curvature. ROM normal. No CVA tenderness. Cardio: regular rate and rhythm, S1, S2 normal, no murmur, click, rub or gallop GI: soft, non-tender; bowel sounds normal; no masses,  no organomegaly Extremities: extremities normal, atraumatic, no cyanosis or edema  ECOG PERFORMANCE STATUS: 1 - Symptomatic but completely ambulatory  Blood pressure 135/69, pulse 88, temperature 97.8 F (36.6 C), temperature source Oral, resp. rate  20, height 5' (1.524 m), weight 169 lb 9 oz (76.9 kg), SpO2 96 %.  LABORATORY DATA: Lab Results  Component Value Date   WBC 7.6 10/09/2018   HGB 12.7 10/09/2018   HCT 40.2 10/09/2018   MCV 95.0 10/09/2018   PLT 265 10/09/2018      Chemistry      Component Value Date/Time   NA 142 10/09/2018 1346   NA 138 10/10/2017 1405   K 4.7 10/09/2018 1346   K 4.6 10/10/2017 1405   CL 103 10/09/2018 1346   CL 104 07/07/2012 1435   CO2 30 10/09/2018 1346   CO2 26 10/10/2017 1405   BUN 23 10/09/2018 1346   BUN 21.3 10/10/2017 1405   CREATININE 0.83 10/09/2018 1346   CREATININE 0.9 10/10/2017 1405      Component Value Date/Time   CALCIUM 9.3 10/09/2018 1346   CALCIUM 9.0 10/10/2017 1405   ALKPHOS 98 10/09/2018 1346   ALKPHOS 93 10/10/2017 1405   AST 18 10/09/2018 1346   AST 20 10/10/2017 1405   ALT 14 10/09/2018 1346   ALT 15 10/10/2017 1405   BILITOT 0.4 10/09/2018 1346   BILITOT 0.49 10/10/2017 1405       RADIOGRAPHIC STUDIES: No results found.  ASSESSMENT AND PLAN: This is a very pleasant 84 years old white female with history of carcinoid tumor of the lung status post resection in 2006.  She has been in observation since that time and the patient is feeling fine.  The  previous CT angiogram of the chest showed a stable pulmonary nodule.  She also had chest x-ray earlier today.  I personally reviewed the x-ray and do not see any concerning findings but the final report is still pending. I recommended for the patient to continue on observation with repeat CT scan by Dr. Roxan Hockey on annual basis for evaluation of the aneurysm as well as the pulmonary nodules. I will see the patient back for follow-up visit in 1 year for reevaluation and repeat blood work. For the history of anemia she will continue on Integra +1 tablet p.o. every other day. The patient was advised to call immediately if she has any concerning symptoms in the interval. The patient voices understanding of current disease status and treatment options and is in agreement with the current care plan. All questions were answered. The patient knows to call the clinic with any problems, questions or concerns. We can certainly see the patient much sooner if necessary.  I spent 10 minutes counseling the patient face to face. The total time spent in the appointment was 15 minutes.  Disclaimer: This note was dictated with voice recognition software. Similar sounding words can inadvertently be transcribed and may not be corrected upon review.

## 2018-10-10 ENCOUNTER — Telehealth: Payer: Self-pay | Admitting: Internal Medicine

## 2018-10-10 NOTE — Telephone Encounter (Signed)
Scheduled appt per 12/16 los - sent reminder letter in the mail with appt date and time

## 2018-11-06 ENCOUNTER — Encounter: Payer: Self-pay | Admitting: Neurology

## 2018-11-06 ENCOUNTER — Ambulatory Visit (INDEPENDENT_AMBULATORY_CARE_PROVIDER_SITE_OTHER): Payer: Medicare Other | Admitting: Neurology

## 2018-11-06 VITALS — BP 133/66 | HR 79 | Ht 65.0 in | Wt 173.0 lb

## 2018-11-06 DIAGNOSIS — G250 Essential tremor: Secondary | ICD-10-CM

## 2018-11-06 NOTE — Patient Instructions (Signed)
Stop the Primidone (mysoline) 50 mg tablet at night.

## 2018-11-06 NOTE — Progress Notes (Signed)
Reason for visit: Tremor  [name] is an 85 y.o. female  History of present illness:  [name] is an 85 year old right-handed [place] female with a history of tremor.  The patient has been on Mysoline, it is not clear whether or not this has been of any help.  The patient comes in today with her daughter.  The daughter indicates that she has been sleeping at night and throughout the day, she is very inactive.  The patient has been walking with a walker, she has not sustained any falls.  The daughter claimed that she does not snore at night.  The patient continues on the 50 mg of Mysoline in the evening.  She returns today for an evaluation.  Past Medical History:  Diagnosis Date  . Ascending aortic aneurysm ([place])    a. 08/2015 stable 5 cm Asc Ao Aneurysm.  . Bronchitis   . Carcinoid tumor   . COPD (chronic obstructive pulmonary disease) ([place])   . Coronary artery disease    a. Ca2+ noted on CT - no other clear documentation of CAD.  Marland Kitchen Difficulty in walking(719.7)   . Disturbance of skin sensation   . Edema   . Essential hypertension   . GERD (gastroesophageal reflux disease)   . Hypercholesterolemia   . Hyperlipidemia   . Mitral valve disease    a. 10/2015 Echo: Mod MS, mild MR.  . Moderate aortic insufficiency    a. 10/2015 Echo: EF 50-55%, no rwma, mod AI, mod MS, mild MR, sev dil LA, mild TR/PR, PASP 78mmHg.  . Pain in joint, forearm   . Pain in joint, hand   . Pain in joint, lower leg   . Pulmonary nodules    s. 08/2015 CT: stable lung nodules.  . Tremor, essential 08/30/2017  . Type II diabetes mellitus ([place])     Past Surgical History:  Procedure Laterality Date  . bronch with endobronchial biopies  09/15/2009  . COLONOSCOPY N/A 09/13/2013   Procedure: COLONOSCOPY;  Surgeon: Jeryl Columbia, MD;  Location: [facility];  Service: Endoscopy;  Laterality: N/A;  . endobronchial excision of right upper lobe tumor with laser bronchi  10/06/2009  . fiberoptic bronch  with endobronchial u/s  07/02/2010  . PARS PLANA VITRECTOMY     right eye  . PARS PLANA VITRECTOMY W/ ENDOPHOTOCOAGULATION     right eye  . posterior capsulectomy     right eye  . VIDEO BRONCHOSCOPY  12/09/2009    Family History  Problem Relation Age of Onset  . Hypertension Mother   . Hypertension Father   . Heart attack Neg Hx   . Stroke Neg Hx     Social history:  reports that she has never smoked. She has never used smokeless tobacco. She reports that she does not drink alcohol or use drugs.   No Known Allergies  Medications:  Prior to Admission medications   Medication Sig Start Date End Date Taking? Authorizing Provider  albuterol (PROVENTIL HFA;VENTOLIN HFA) 108 (90 Base) MCG/ACT inhaler Inhale 1-2 puffs into the lungs every 6 (six) hours as needed for wheezing or shortness of breath. 12/02/16  Yes Long, Wonda Olds, MD  amLODipine (NORVASC) 10 MG tablet TAKE 1 TABLET BY MOUTH EVERY DAY. Please keep upcoming appt in August for future refills. Thank you 04/03/18  Yes Josue Hector, MD  aspirin EC 81 MG tablet Take 81 mg by mouth every evening. 7 pm   Yes [provider]  atorvastatin (  LIPITOR) 10 MG tablet Take 10 mg by mouth at bedtime. 06/17/15  Yes [provider]  buprenorphine (BUTRANS) 5 MCG/HR PTWK patch Place 1 patch (5 mcg total) onto the skin once a week. 03/27/18  Yes Kirsteins, Luanna Salk, MD  ciclopirox (PENLAC) 8 % solution Apply topically at bedtime. Apply over nail and surrounding skin. Apply daily over previous coat. After seven (7) days, may remove with alcohol and continue cycle. 02/03/18  Yes Trula Slade, DPM  DULoxetine (CYMBALTA) 30 MG capsule TAKE 1 CAPSULE BY MOUTH EVERY DAY 04/25/18  Yes Arfeen, Arlyce [name], MD  FeFum-FePoly-FA-B Cmp-C-Biot (INTEGRA PLUS) CAPS TAKE ONE CAPSULE BY MOUTH EVERY DAY *NOT COVERED* 09/06/18  Yes Curt Bears, MD  fluticasone (VERAMYST) 27.5 MCG/SPRAY nasal spray Place 2 sprays into the nose daily.   Yes [provider]  furosemide (LASIX) 20 MG tablet TAKE 1 TABLET (20 MG TOTAL) BY MOUTH DAILY AS NEEDED FOR FLUID OR EDEMA. 10/15/16  Yes Josue Hector, MD  glimepiride (AMARYL) 1 MG tablet Take 1 mg by mouth daily.    Yes [provider]  hydrALAZINE (APRESOLINE) 50 MG tablet TAKE 1 TABLET BY MOUTH THREE TIMES A DAY 03/27/18  Yes Josue Hector, MD  hydrochlorothiazide (MICROZIDE) 12.5 MG capsule Take by mouth daily. 09/26/18  Yes [provider]  montelukast (SINGULAIR) 10 MG tablet Take 10 mg by mouth at bedtime. 06/04/15  Yes [provider]  Multiple Vitamin (MULITIVITAMIN WITH MINERALS) TABS Take 1 tablet by mouth daily.   Yes [provider]  nitroGLYCERIN (NITROSTAT) 0.4 MG SL tablet PLACE 1 TABLET UNDER THE TONGUE EVERY 5 MINUTES AS NEEDED FOR CHEST PAIN 09/14/16  Yes Josue Hector, MD  nystatin cream (MYCOSTATIN) Apply to affected area 2 times daily 03/21/17  Yes Drenda Freeze, MD  olmesartan (BENICAR) 40 MG tablet TAKE 1 TABLET BY MOUTH TWICE A DAY 07/17/18  Yes Josue Hector, MD  Omega-3 Fatty Acids (FISH OIL) 1200 MG CAPS Take 1 capsule by mouth daily.   Yes [provider]  omeprazole (PRILOSEC) 40 MG capsule Take 40 mg by mouth daily.   Yes [provider]  oxybutynin (DITROPAN-XL) 10 MG 24 hr tablet Take 10 mg daily by mouth. 08/19/17  Yes [provider]  pioglitazone (ACTOS) 15 MG tablet Take 7.5 mg by mouth daily.    Yes [provider]  potassium chloride (K-DUR,KLOR-CON) 10 MEQ tablet Take 20 mEq by mouth daily.   Yes [provider]  primidone (MYSOLINE) 50 MG tablet TAKE 1 TABLET BY MOUTH EVERYDAY AT BEDTIME 09/29/18  Yes Kathrynn Ducking, MD  Probiotic Product (PROBIOTIC PO) Take 1 tablet by mouth daily.   Yes [provider]  Spacer/Aero-Holding Chambers (AEROCHAMBER MINI CHAMBER) DEVI use as directed with your inhalers  04/27/11  Yes [provider]  traMADol (ULTRAM) 50 MG  tablet Take 2 tablets (100 mg total) by mouth every 8 (eight) hours as needed. 08/15/17  Yes Kirsteins, Luanna Salk, MD  triamcinolone cream (KENALOG) 0.1 % Apply 1 application topically 2 (two) times daily. 03/18/17  Yes Barnet Glasgow, NP    ROS:  Out of a complete 14 system review of symptoms, the patient complains only of the following symptoms, and all other reviewed systems are negative.  Decreased appetite Cough Weakness Depression  Blood pressure 133/66, pulse 79, height 5\' 5"  (1.651 m), weight 173 lb (78.5 kg).  Physical Exam  General: The patient is alert and cooperative at  the time of the examination.  The patient is moderately to markedly obese.  Skin: No significant peripheral edema is noted.   Neurologic Exam  Mental status: The patient is alert and oriented x 3 at the time of the examination. The patient has apparent normal recent and remote memory, with an apparently normal attention span and concentration ability.   Cranial nerves: Facial symmetry is present. Speech is normal, no aphasia or dysarthria is noted. Extraocular movements are full. Visual fields are full.  Motor: The patient has good strength in all 4 extremities.  Sensory examination: Soft touch sensation is symmetric on the face, arms, and legs.  Coordination: The patient has good finger-nose-finger and heel-to-shin bilaterally.  No significant tremor was seen with finger-nose-finger.  Gait and station: The patient has a slightly wide-based gait, the patient walks with a walker.  She has good stability with a walker.  Reflexes: Deep tendon reflexes are symmetric.   Assessment/Plan:  1.  History of tremor  2.  Excessive daytime drowsiness  The daughter is concerned that the patient may be becoming somewhat depressed.  The patient will be taken off of primidone, I do not see that she needs the medication.  If the drowsiness does not improve, we may consider a sleep evaluation in the future, the  daughter will contact our office.  Jill Alexanders MD 11/06/2018 2:43 PM  [facility] [address] [place], [place] [address]  Phone [phone] Fax [phone]

## 2018-11-07 ENCOUNTER — Ambulatory Visit (HOSPITAL_COMMUNITY): Payer: Self-pay | Admitting: Psychiatry

## 2018-12-15 ENCOUNTER — Other Ambulatory Visit: Payer: Self-pay | Admitting: Cardiovascular Disease

## 2018-12-15 MED ORDER — NITROGLYCERIN 0.4 MG SL SUBL: SUBLINGUAL_TABLET | SUBLINGUAL | 4 refills | Status: AC

## 2018-12-19 ENCOUNTER — Telehealth: Payer: Self-pay | Admitting: Physical Medicine & Rehabilitation

## 2018-12-19 NOTE — Telephone Encounter (Signed)
Please call in Butrans patch 69mcg apply weekly #4 with 2 RF, pt to see NP in 3 mo

## 2018-12-19 NOTE — Telephone Encounter (Signed)
Patient's daughter is calling to request refill on patches.  She said Tylenol ising' working and wants the patches for pain.  Please call daughter.

## 2018-12-20 MED ORDER — BUPRENORPHINE 5 MCG/HR TD PTWK: 1.0000 | MEDICATED_PATCH | TRANSDERMAL | 2 refills | Status: AC

## 2018-12-20 NOTE — Telephone Encounter (Signed)
ordered

## 2018-12-26 ENCOUNTER — Other Ambulatory Visit: Payer: Self-pay | Admitting: Cardiovascular Disease

## 2019-01-06 DIAGNOSIS — J40 Bronchitis, not specified as acute or chronic: Secondary | ICD-10-CM | POA: Diagnosis not present

## 2019-02-28 ENCOUNTER — Other Ambulatory Visit: Payer: Self-pay | Admitting: Thoracic Surgery (Cardiothoracic Vascular Surgery)

## 2019-02-28 DIAGNOSIS — I7121 Aneurysm of the ascending aorta, without rupture: Secondary | ICD-10-CM

## 2019-02-28 DIAGNOSIS — I712 Thoracic aortic aneurysm, without rupture, unspecified: Secondary | ICD-10-CM

## 2019-03-23 ENCOUNTER — Other Ambulatory Visit: Payer: Self-pay | Admitting: Cardiovascular Disease

## 2019-03-23 DIAGNOSIS — I059 Rheumatic mitral valve disease, unspecified: Secondary | ICD-10-CM

## 2019-03-23 MED ORDER — OLMESARTAN MEDOXOMIL 40 MG PO TABS: ORAL_TABLET | ORAL | 0 refills | Status: DC

## 2019-03-23 MED ORDER — HYDRALAZINE HCL 50 MG PO TABS: 50.0000 mg | ORAL_TABLET | Freq: Three times a day (TID) | ORAL | 0 refills | Status: DC

## 2019-03-23 MED ORDER — HYDRALAZINE HCL 50 MG PO TABS
50.0000 mg | ORAL_TABLET | Freq: Three times a day (TID) | ORAL | 0 refills | Status: DC
Start: 2019-03-23 — End: 2019-05-21

## 2019-03-23 NOTE — Telephone Encounter (Signed)
Pt's medication was sent to pt's pharmacy as requested. Confirmation received.  °

## 2019-03-23 NOTE — Telephone Encounter (Signed)
°*  STAT* If patient is at the pharmacy, call can be transferred to refill team.   1. Which medications need to be refilled? (please list name of each medication and dose if known)  hydrALAZINE (APRESOLINE) 50 MG tablet olmesartan (BENICAR) 40 MG tablet  2. Which pharmacy/location (including street and city if local pharmacy) is medication to be sent to? [id number] [place], [place]  3. Do they need a 30 day or 90 day supply? [place]

## 2019-04-11 ENCOUNTER — Other Ambulatory Visit: Payer: Self-pay | Admitting: Internal Medicine

## 2019-04-11 DIAGNOSIS — C7A090 Malignant carcinoid tumor of the bronchus and lung: Secondary | ICD-10-CM

## 2019-05-18 ENCOUNTER — Other Ambulatory Visit: Payer: Medicare Other

## 2019-05-21 ENCOUNTER — Other Ambulatory Visit: Payer: Self-pay | Admitting: *Deleted

## 2019-05-21 ENCOUNTER — Telehealth: Payer: Self-pay | Admitting: Cardiovascular Disease

## 2019-05-21 DIAGNOSIS — I059 Rheumatic mitral valve disease, unspecified: Secondary | ICD-10-CM

## 2019-05-21 MED ORDER — OLMESARTAN MEDOXOMIL 40 MG PO TABS: ORAL_TABLET | ORAL | 0 refills | Status: AC

## 2019-05-21 MED ORDER — HYDRALAZINE HCL 50 MG PO TABS: 50.0000 mg | ORAL_TABLET | Freq: Three times a day (TID) | ORAL | 0 refills | Status: AC

## 2019-05-21 NOTE — Telephone Encounter (Signed)
New Message     *STAT* If patient is at the pharmacy, call can be transferred to refill team.   1. Which medications need to be refilled? (please list name of each medication and dose if known) hydrALAZINE (APRESOLINE) 50 MG tablet olmesartan (BENICAR) 40 MG tablet  2. Which pharmacy/location (including street and city if local pharmacy) is medication to be sent to? [id number] [place], [place] - [place]  3. Do they need a 30 day or 90 day supply? 90 day supply

## 2019-05-22 ENCOUNTER — Ambulatory Visit: Payer: Medicare Other | Admitting: Thoracic Surgery (Cardiothoracic Vascular Surgery)

## 2019-06-13 ENCOUNTER — Telehealth: Payer: Self-pay | Admitting: Medical Oncology

## 2019-06-13 NOTE — Telephone Encounter (Signed)
I did not speak to anyone regarding signing her death certificate.  I am not sure what is the cause of her this.  This is unlikely to be related to her carcinoid tumor.  I do not think I would be able to sign her death certificate because I do not have enough information.

## 2019-06-13 NOTE — Telephone Encounter (Signed)
Pt deceased

## 2019-06-13 NOTE — Telephone Encounter (Addendum)
Death Certificate-EMS for Saturday notes that EMS  spoke to [facility] and was told he would sign death certificate.  [name] sign it?

## 2019-06-14 ENCOUNTER — Other Ambulatory Visit: Payer: Self-pay | Admitting: Cardiovascular Disease

## 2019-06-14 NOTE — Telephone Encounter (Signed)
Outpatient Medication Detail   Disp Refills Start End   amLODipine (NORVASC) 10 MG tablet 90 tablet 1 12/26/2018    Sig: TAKE 1 TABLET BY MOUTH EVERY DAY   Sent to pharmacy as: amLODipine (NORVASC) 10 MG tablet   E-Prescribing Status: Receipt confirmed by pharmacy (12/26/2018 4:25 PM EST)   Pharmacy  [id number] - [place], [place]

## 2019-06-19 ENCOUNTER — Telehealth: Payer: Self-pay | Admitting: Cardiovascular Disease

## 2019-06-19 NOTE — Telephone Encounter (Signed)
Death certificate received from [place]. Placed in box for Dr. Johnsie Cancel to sign. 06/19/19 vlm

## 2019-06-26 ENCOUNTER — Telehealth: Payer: Self-pay | Admitting: Cardiovascular Disease

## 2019-06-26 NOTE — Telephone Encounter (Signed)
° ° ° °  Please call [phone] [facility] calling for signature on death certificate. Death certificate was dropped off to [facility]

## 2019-06-26 DEATH — deceased

## 2019-06-27 ENCOUNTER — Telehealth: Payer: Self-pay | Admitting: Cardiovascular Disease

## 2019-06-27 ENCOUNTER — Ambulatory Visit: Payer: Medicare Other | Admitting: Cardiovascular Disease

## 2019-06-27 NOTE — Telephone Encounter (Signed)
Death certificate signed by Dr. Johnsie Cancel. Funeral home notified. 06/27/19 vlm

## 2019-06-27 NOTE — Telephone Encounter (Signed)
Nurse can't find it primary care doctor should be signing

## 2019-10-11 ENCOUNTER — Other Ambulatory Visit: Payer: Self-pay

## 2019-10-11 ENCOUNTER — Ambulatory Visit: Payer: Self-pay | Admitting: Internal Medicine
